# Patient Record
Sex: Male | Born: 1950 | Race: White | Hispanic: No | Marital: Single | Smoking: Never smoker
Health system: Southern US, Community
[De-identification: ages and names within clinical notes are randomized; demographics above are authoritative.]

## PROBLEM LIST (undated history)

## (undated) DIAGNOSIS — N400 Enlarged prostate without lower urinary tract symptoms: Secondary | ICD-10-CM

## (undated) DIAGNOSIS — F79 Unspecified intellectual disabilities: Secondary | ICD-10-CM

## (undated) DIAGNOSIS — F32A Depression, unspecified: Secondary | ICD-10-CM

## (undated) DIAGNOSIS — F418 Other specified anxiety disorders: Secondary | ICD-10-CM

## (undated) DIAGNOSIS — G809 Cerebral palsy, unspecified: Secondary | ICD-10-CM

## (undated) DIAGNOSIS — R569 Unspecified convulsions: Secondary | ICD-10-CM

## (undated) DIAGNOSIS — N200 Calculus of kidney: Secondary | ICD-10-CM

## (undated) DIAGNOSIS — K922 Gastrointestinal hemorrhage, unspecified: Secondary | ICD-10-CM

## (undated) DIAGNOSIS — F329 Major depressive disorder, single episode, unspecified: Secondary | ICD-10-CM

## (undated) DIAGNOSIS — K222 Esophageal obstruction: Secondary | ICD-10-CM

## (undated) DIAGNOSIS — I10 Essential (primary) hypertension: Secondary | ICD-10-CM

---

## 2000-10-07 HISTORY — PX: BASAL CELL CARCINOMA EXCISION: SHX1214

## 2000-10-07 HISTORY — PX: OTHER SURGICAL HISTORY: SHX169

## 2000-11-25 ENCOUNTER — Emergency Department (HOSPITAL_COMMUNITY): Admission: EM | Admit: 2000-11-25 | Discharge: 2000-11-25 | Payer: Self-pay | Admitting: Emergency Medicine

## 2000-11-25 ENCOUNTER — Encounter: Payer: Self-pay | Admitting: Emergency Medicine

## 2002-10-07 HISTORY — PX: SQUAMOUS CELL CARCINOMA EXCISION: SHX2433

## 2003-03-04 ENCOUNTER — Encounter: Admission: RE | Admit: 2003-03-04 | Discharge: 2003-06-02 | Payer: Self-pay | Admitting: Endocrinology

## 2004-08-07 ENCOUNTER — Emergency Department (HOSPITAL_COMMUNITY): Admission: EM | Admit: 2004-08-07 | Discharge: 2004-08-07 | Payer: Self-pay | Admitting: Emergency Medicine

## 2007-10-08 DIAGNOSIS — K222 Esophageal obstruction: Secondary | ICD-10-CM

## 2007-10-08 DIAGNOSIS — K922 Gastrointestinal hemorrhage, unspecified: Secondary | ICD-10-CM

## 2007-10-08 HISTORY — DX: Esophageal obstruction: K22.2

## 2007-10-08 HISTORY — DX: Gastrointestinal hemorrhage, unspecified: K92.2

## 2008-08-22 ENCOUNTER — Encounter (INDEPENDENT_AMBULATORY_CARE_PROVIDER_SITE_OTHER): Payer: Self-pay | Admitting: *Deleted

## 2008-08-22 ENCOUNTER — Emergency Department (HOSPITAL_COMMUNITY): Admission: EM | Admit: 2008-08-22 | Discharge: 2008-08-22 | Payer: Self-pay | Admitting: Emergency Medicine

## 2008-09-17 ENCOUNTER — Encounter (INDEPENDENT_AMBULATORY_CARE_PROVIDER_SITE_OTHER): Payer: Self-pay | Admitting: *Deleted

## 2008-09-17 ENCOUNTER — Ambulatory Visit: Payer: Self-pay | Admitting: Internal Medicine

## 2008-09-17 ENCOUNTER — Inpatient Hospital Stay (HOSPITAL_COMMUNITY): Admission: EM | Admit: 2008-09-17 | Discharge: 2008-09-20 | Payer: Self-pay | Admitting: Emergency Medicine

## 2008-09-18 ENCOUNTER — Encounter: Payer: Self-pay | Admitting: Internal Medicine

## 2008-09-18 ENCOUNTER — Encounter (INDEPENDENT_AMBULATORY_CARE_PROVIDER_SITE_OTHER): Payer: Self-pay | Admitting: *Deleted

## 2008-09-19 ENCOUNTER — Telehealth: Payer: Self-pay | Admitting: Internal Medicine

## 2008-09-20 ENCOUNTER — Telehealth (INDEPENDENT_AMBULATORY_CARE_PROVIDER_SITE_OTHER): Payer: Self-pay | Admitting: *Deleted

## 2008-09-20 ENCOUNTER — Encounter: Payer: Self-pay | Admitting: Gastroenterology

## 2008-10-19 DIAGNOSIS — K449 Diaphragmatic hernia without obstruction or gangrene: Secondary | ICD-10-CM | POA: Insufficient documentation

## 2008-10-19 DIAGNOSIS — F79 Unspecified intellectual disabilities: Secondary | ICD-10-CM | POA: Insufficient documentation

## 2008-10-19 DIAGNOSIS — G809 Cerebral palsy, unspecified: Secondary | ICD-10-CM | POA: Insufficient documentation

## 2008-10-19 DIAGNOSIS — Z87898 Personal history of other specified conditions: Secondary | ICD-10-CM

## 2008-10-19 DIAGNOSIS — F329 Major depressive disorder, single episode, unspecified: Secondary | ICD-10-CM

## 2008-10-19 DIAGNOSIS — K209 Esophagitis, unspecified without bleeding: Secondary | ICD-10-CM | POA: Insufficient documentation

## 2008-10-19 DIAGNOSIS — Z8719 Personal history of other diseases of the digestive system: Secondary | ICD-10-CM

## 2008-10-24 ENCOUNTER — Ambulatory Visit: Payer: Self-pay | Admitting: Internal Medicine

## 2008-12-14 ENCOUNTER — Emergency Department (HOSPITAL_COMMUNITY): Admission: EM | Admit: 2008-12-14 | Discharge: 2008-12-14 | Payer: Self-pay | Admitting: Emergency Medicine

## 2009-01-30 ENCOUNTER — Encounter: Payer: Self-pay | Admitting: Internal Medicine

## 2009-02-21 ENCOUNTER — Encounter: Admission: RE | Admit: 2009-02-21 | Discharge: 2009-05-22 | Payer: Self-pay | Admitting: Endocrinology

## 2009-07-12 ENCOUNTER — Encounter: Admission: RE | Admit: 2009-07-12 | Discharge: 2009-10-04 | Payer: Self-pay | Admitting: Endocrinology

## 2009-11-10 ENCOUNTER — Encounter: Payer: Self-pay | Admitting: Internal Medicine

## 2009-11-23 ENCOUNTER — Encounter: Payer: Self-pay | Admitting: Internal Medicine

## 2010-03-14 ENCOUNTER — Inpatient Hospital Stay (HOSPITAL_COMMUNITY): Admission: EM | Admit: 2010-03-14 | Discharge: 2010-03-19 | Payer: Self-pay | Admitting: Emergency Medicine

## 2010-03-14 ENCOUNTER — Encounter (INDEPENDENT_AMBULATORY_CARE_PROVIDER_SITE_OTHER): Payer: Self-pay | Admitting: *Deleted

## 2010-05-24 ENCOUNTER — Encounter: Payer: Self-pay | Admitting: Internal Medicine

## 2010-06-01 ENCOUNTER — Emergency Department (HOSPITAL_COMMUNITY): Admission: EM | Admit: 2010-06-01 | Discharge: 2010-06-01 | Payer: Self-pay | Admitting: Emergency Medicine

## 2010-07-14 ENCOUNTER — Inpatient Hospital Stay (HOSPITAL_COMMUNITY): Admission: EM | Admit: 2010-07-14 | Discharge: 2010-07-16 | Payer: Self-pay | Admitting: Emergency Medicine

## 2010-07-25 ENCOUNTER — Emergency Department (HOSPITAL_COMMUNITY): Admission: EM | Admit: 2010-07-25 | Discharge: 2010-07-25 | Payer: Self-pay | Admitting: Emergency Medicine

## 2010-08-07 ENCOUNTER — Emergency Department (HOSPITAL_COMMUNITY): Admission: EM | Admit: 2010-08-07 | Discharge: 2010-08-07 | Payer: Self-pay | Admitting: Emergency Medicine

## 2010-08-11 ENCOUNTER — Emergency Department (HOSPITAL_COMMUNITY): Admission: EM | Admit: 2010-08-11 | Discharge: 2010-08-11 | Payer: Self-pay | Admitting: Emergency Medicine

## 2010-08-20 ENCOUNTER — Emergency Department (HOSPITAL_COMMUNITY): Admission: EM | Admit: 2010-08-20 | Discharge: 2010-08-21 | Payer: Self-pay | Admitting: Emergency Medicine

## 2010-08-29 ENCOUNTER — Ambulatory Visit: Payer: Self-pay | Admitting: Internal Medicine

## 2010-09-22 ENCOUNTER — Emergency Department (HOSPITAL_COMMUNITY)
Admission: EM | Admit: 2010-09-22 | Discharge: 2010-09-22 | Payer: Self-pay | Source: Home / Self Care | Admitting: Emergency Medicine

## 2010-10-03 ENCOUNTER — Encounter: Payer: Self-pay | Admitting: Internal Medicine

## 2010-10-03 ENCOUNTER — Ambulatory Visit (HOSPITAL_COMMUNITY)
Admission: RE | Admit: 2010-10-03 | Discharge: 2010-10-03 | Payer: Self-pay | Source: Home / Self Care | Attending: Internal Medicine | Admitting: Internal Medicine

## 2010-10-06 ENCOUNTER — Encounter: Payer: Self-pay | Admitting: Internal Medicine

## 2010-11-06 NOTE — Miscellaneous (Signed)
Summary: Protonix Refills  Clinical Lists Changes  Medications: Changed medication from PROTONIX 40 MG TBEC (PANTOPRAZOLE SODIUM) 1 tab by mouth by mouth two times a day to PROTONIX 40 MG TBEC (PANTOPRAZOLE SODIUM) 1 tab by mouth by mouth two times a day. NEED OFFICE VISIT FOR FURTHER REFILLS! - Signed Rx of PROTONIX 40 MG TBEC (PANTOPRAZOLE SODIUM) 1 tab by mouth by mouth two times a day. NEED OFFICE VISIT FOR FURTHER REFILLS!;  #60 x 2;  Signed;  Entered by: Lamona Curl CMA (AAMA);  Authorized by: Hart Carwin MD;  Method used: Faxed to [facility], [address] [place], [place], [place]  [address], Ph: [phone], Fax: [phone]    Prescriptions: PROTONIX 40 MG TBEC (PANTOPRAZOLE SODIUM) 1 tab by mouth by mouth two times a day. NEED OFFICE VISIT FOR FURTHER REFILLS!  #60 x 2   Entered by:   Lamona Curl CMA (AAMA)   Authorized by:   Hart Carwin MD   Signed by:   Lamona Curl CMA (AAMA) on 05/24/2010   Method used:   Faxed to ...       [facility] (retail)       [address]       [place], [place]  [address]       Ph: [phone]       Fax: [phone]   RxID:   [id number]

## 2010-11-06 NOTE — Assessment & Plan Note (Signed)
Summary: refill Protonix...as.   History of Present Illness Visit Type: Follow-up Visit Primary GI MD: Lina Sar MD Primary Romona Murdy: Adrian Prince, MD Chief Complaint: Patient doing good and just needs a refill of Protonix.  History of Present Illness:   This is a 60 year old white male with cerebral palsy who lives in a group home. He comes today for refills of his Protonix. His last office visit was in January 2010. He has been on Protonix 40 mg twice  a day with occasional heartburn and cough. An upper endoscopy in December 2009  showed reflux esophagitis and a stricture as well as a hiatal hernia. The biopsy showed esophagitis but no evidence of Barrett's esophagus. His caregiver describes patient to have regular bowel habits with occasional blood per rectum.   GI Review of Systems      Denies abdominal pain, acid reflux, belching, bloating, chest pain, dysphagia with liquids, dysphagia with solids, heartburn, loss of appetite, nausea, vomiting, vomiting blood, weight loss, and  weight gain.        Denies anal fissure, black tarry stools, change in bowel habit, constipation, diarrhea, diverticulosis, fecal incontinence, heme positive stool, hemorrhoids, irritable bowel syndrome, jaundice, light color stool, liver problems, rectal bleeding, and  rectal pain.    Current Medications (verified): 1)  Protonix 40 Mg Tbec (Pantoprazole Sodium) .Marland Kitchen.. 1 Tab By Mouth By Mouth Two Times A Day. Need Office Visit For Further Refills! 2)  Cardura 8 Mg Tabs (Doxazosin Mesylate) .... Take 1/2  Tablet By Mouth Once A Day 3)  Lexapro 20 Mg Tabs (Escitalopram Oxalate) .... Take 1 Tablet By Mouth Once A Day 4)  Multivitamins  Tabs (Multiple Vitamin) .... Take 1 Tablet By Mouth Once A Day 5)  Dilantin 100 Mg Caps (Phenytoin Sodium Extended) .... Take Two By Mouth Once Daily 6)  Amitriptyline Hcl 25 Mg Tabs (Amitriptyline Hcl) .... Take One By Mouth Once Daily 7)  Lorazepam 1 Mg Tabs (Lorazepam) .... Take  One By Mouth Two Times A Day 8)  Enablex 15 Mg Xr24h-Tab (Darifenacin Hydrobromide) .... Take One By Mouth Once Daily 9)  Tramadol Hcl 50 Mg Tabs (Tramadol Hcl) .... Take One By Mouth As Needed 10)  Antibiotic .... Take One By Mouth Every 8 Hours  Allergies (verified): No Known Drug Allergies  Past History:  Past Medical History: Reviewed history from 10/19/2008 and no changes required. Current Problems:  HIATAL HERNIA (ICD-553.3) GASTROINTESTINAL HEMORRHAGE, HX OF (ICD-V12.79) ESOPHAGITIS (ICD-530.10) BENIGN PROSTATIC HYPERTROPHY, HX OF (ICD-V13.8) DEPRESSION, CHRONIC (ICD-311) CEREBRAL PALSY (ICD-343.9) MENTAL RETARDATION (ICD-319)  Past Surgical History: Reviewed history from 10/19/2008 and no changes required. unremarkable  Family History: Reviewed history from 10/19/2008 and no changes required. Unknown  Social History: Reviewed history from 10/19/2008 and no changes required. Patient lives in a Group home and is wheel chair bound Alcohol Use - no Illicit Drug Use - no  Review of Systems       Pertinent positive and negative review of systems were noted in the above HPI. All other ROS was otherwise negative.   Vital Signs:  Patient profile:   60 year old male Height:      73 inches Pulse rate:   100 / minute Pulse rhythm:   regular BP sitting:   124 / 74  (left arm) Cuff size:   regular  Vitals Entered By: Harlow Mares CMA Duncan Dull) (August 29, 2010 8:45 AM) patient unable to weigh   Physical Exam  General:  alert and oriented, in a wheelchair  with multiple deformities. Eyes:  nonicteric. Neck:  Supple; no masses or thyromegaly. Lungs:  Clear throughout to auscultation. Heart:  Regular rate and rhythm; no murmurs, rubs,  or bruits. Abdomen:  soft abdomen unable to Lay on the examining table. Normoactive bowel sounds. No tenderness. Rectal:  soft Hemoccult negative stool. Extremities:  joint deformities. Unable to get up or stand up. Skin:  no  pressure ulcers. Psych:  Alert and cooperative. Normal mood and affect.   Impression & Recommendations:  Problem # 1:  HIATAL HERNIA (ICD-553.3) Patient has a history of gastroesophageal reflux and an upper GI bleed in December 2009. He is controlled with Protonix 40 mg daily. He has had symptoms of heartburn indicating inadequate acid suppression. We will increase Protonix to 40 mg p.o. b.i.d.  Problem # 2:  CEREBRAL PALSY (ICD-343.9) Assessment: Comment Only  Problem # 3:  SPECIAL SCREENING FOR MALIGNANT NEOPLASMS COLON (ICD-V76.51)  We will schedule patient for a screening colonoscopy. He has had occasional blood per rectum but today's exam shows heme negative stool.   Orders: Colonoscopy (Colon)  Patient Instructions: 1)  Continue your Protonix to 40 mg twice daily. 2)  You have been scheduled for a coloscopy. Please follow written prep instructions that were given to you today at your visit.  3)  Please pick up your prescription for Miralax, Dulcolax and Reglan at the pharmacy. An electronic presription has already been sent.  4)  You should follow your antireflux measures. 5)  Copy sent to : Dr Kathie Rhodes.[place] 6)  The medication list was reviewed and reconciled.  All changed / newly prescribed medications were explained.  A complete medication list was provided to the patient / caregiver. Prescriptions: DULCOLAX 5 MG  TBEC (BISACODYL) Day before procedure take 2 at 3pm and 2 at 8pm.  #4 x 0   Entered by:   Lamona Curl CMA (AAMA)   Authorized by:   Hart Carwin MD   Signed by:   Lamona Curl CMA (AAMA) on 08/29/2010   Method used:   Faxed to ...       [facility] (retail)       [address]       [place], [place]  [address]       Ph: [phone]       Fax: [phone]   RxID:   [id number] REGLAN 10 MG  TABS (METOCLOPRAMIDE HCL) As per prep instructions.  #2 x 0   Entered by:   Lamona Curl CMA (AAMA)   Authorized by:    Hart Carwin MD   Signed by:   Lamona Curl CMA (AAMA) on 08/29/2010   Method used:   Faxed to ...       [facility] (retail)       [address]       [place], [place]  [address]       Ph: [phone]       Fax: [phone]   RxID:   [id number] MIRALAX   POWD (POLYETHYLENE GLYCOL 3350) As per prep  instructions.  #255gm x 0   Entered by:   Lamona Curl CMA (AAMA)   Authorized by:   Hart Carwin MD   Signed by:   Lamona Curl CMA (AAMA) on 08/29/2010   Method used:   Faxed to ...       [facility] (retail)  [address]       [place], [place]  [address]       Ph: [phone]       Fax: [phone]   RxID:   [id number] PROTONIX 40 MG TBEC (PANTOPRAZOLE SODIUM) 1 tab by mouth by mouth two times a day.  #60 x 4   Entered by:   Lamona Curl CMA (AAMA)   Authorized by:   Hart Carwin MD   Signed by:   Lamona Curl CMA (AAMA) on 08/29/2010   Method used:   Faxed to ...       [facility] (retail)       [address]       [place], [place]  [address]       Ph: [phone]       Fax: [phone]   RxID:   [id number]

## 2010-11-06 NOTE — Letter (Signed)
Summary: [facility] Instructions  [facility]  [address] [place], [place] [address]   Phone: [phone]  Fax: [phone]       Colin Bradley    [birth year]    MRN: [id number]       Procedure Day Dorna Bloom: Wednesday 10/03/10     Arrival Time: 7:30 am     Procedure Time: 8:30 am     Location of Procedure:                     _x _  [facility] ( Outpatient Registration)  PREPARATION FOR COLONOSCOPY WITH MIRALAX  Starting 5 days prior to your procedure (09/29/10) do not eat nuts, seeds, popcorn, corn, beans, peas,  salads, or any raw vegetables.  Do not take any fiber supplements (e.g. Metamucil, Citrucel, and Benefiber). ____________________________________________________________________________________________________   THE DAY BEFORE YOUR PROCEDURE         DATE: 10/02/10 DAY: Tuesday  1   Drink clear liquids the entire day-NO SOLID FOOD  2   Do not drink anything colored red or purple.  Avoid juices with pulp.  No orange juice.  3   Drink at least 64 oz. (8 glasses) of fluid/clear liquids during the day to prevent dehydration and help the prep work efficiently.  CLEAR LIQUIDS INCLUDE: Water Jello Ice Popsicles Tea (sugar ok, no milk/cream) Powdered fruit flavored drinks Coffee (sugar ok, no milk/cream) Gatorade Juice: apple, white grape, white cranberry  Lemonade Clear bullion, consomm, broth Carbonated beverages (any kind) Strained chicken noodle soup Hard Candy  4   Mix the entire bottle of Miralax with 64 oz. of Gatorade/Powerade in the morning and put in the refrigerator to chill.  5   At 3:00 pm take 2 Dulcolax/Bisacodyl tablets.  6   At 4:30 pm take one Reglan/Metoclopramide tablet.  7  Starting at 5:00 pm drink one 8 oz glass of the Miralax mixture every 15-20 minutes until you have finished drinking the entire 64 oz.  You should finish drinking prep around 7:30 or 8:00 pm.  8   If you are nauseated, you may take the 2nd  Reglan/Metoclopramide tablet at 6:30 pm.        9    At 8:00 pm take 2 more DULCOLAX/Bisacodyl tablets.     THE DAY OF YOUR PROCEDURE      DATE:  10/03/10 DAY: Wednesday  You may drink clear liquids until 4:30 am  (4 HOURS BEFORE PROCEDURE).   MEDICATION INSTRUCTIONS  Unless otherwise instructed, you should take regular prescription medications with a small sip of water as early as possible the morning of your procedure.        OTHER INSTRUCTIONS  You will need a responsible adult at least 61 years of age to accompany you and drive you home.   This person must remain in the waiting room during your procedure.  Wear loose fitting clothing that is easily removed.  Leave jewelry and other valuables at home.  However, you may wish to bring a book to read or an iPod/MP3 player to listen to music as you wait for your procedure to start.  Remove all body piercing jewelry and leave at home.  Total time from sign-in until discharge is approximately 2-3 hours.  You should go home directly after your procedure and rest.  You can resume normal activities the day after your procedure.  The day of your procedure you should not:   Drive   Make legal  decisions   Operate machinery   Drink alcohol   Return to work  You will receive specific instructions about eating, activities and medications before you leave.   The above instructions have been reviewed and explained to me by   Lamona Curl CMA Duncan Dull)  August 29, 2010 9:17 AM     I fully understand and can verbalize these instructions _____________________________ Date 08/29/10

## 2010-11-06 NOTE — Miscellaneous (Signed)
Summary: protonix rx  Clinical Lists Changes  Medications: Changed medication from PROTONIX 40 MG TBEC (PANTOPRAZOLE SODIUM) 1 tab by mouth by mouth two times a day to PROTONIX 40 MG TBEC (PANTOPRAZOLE SODIUM) 1 tab by mouth by mouth two times a day - Signed Rx of PROTONIX 40 MG TBEC (PANTOPRAZOLE SODIUM) 1 tab by mouth by mouth two times a day;  #60 x 5;  Signed;  Entered by: Hortense Ramal CMA (AAMA);  Authorized by: Hart Carwin MD;  Method used: Faxed to [facility], [address] [place], [place], [place]  [address], Ph: [phone], Fax: [phone]    Prescriptions: PROTONIX 40 MG TBEC (PANTOPRAZOLE SODIUM) 1 tab by mouth by mouth two times a day  #60 x 5   Entered by:   Hortense Ramal CMA (AAMA)   Authorized by:   Hart Carwin MD   Signed by:   Hortense Ramal CMA (AAMA) on 11/10/2009   Method used:   Faxed to ...       [facility] (retail)       [address]       [place], [place]  [address]       Ph: [phone]       Fax: [phone]   RxID:   [id number]

## 2010-11-06 NOTE — Medication Information (Signed)
Summary: Protonix/[employer]  Protonix/[employer]   Imported By: Sherian Rein 12/20/2009 09:04:13  _____________________________________________________________________  External Attachment:    Type:   Image     Comment:   External Document

## 2010-11-08 NOTE — Procedures (Signed)
Summary: Colonoscopy  Patient: [name] Note: All result statuses are Final unless otherwise noted.  Tests: (1) Colonoscopy (COL)   COL Colonoscopy           DONE     [facility]     [address] [place], [place]  [address]           COLONOSCOPY PROCEDURE REPORT           PATIENT:  Colin Bradley, Colin Bradley  MR#:  [id number]     BIRTHDATE:  01-07-1951, 59 yrs. old  GENDER:  male     ENDOSCOPIST:  Hedwig Morton. Juanda Chance, MD     REF. BY:  Adrian Prince, M.D.     PROCEDURE DATE:  10/03/2010     PROCEDURE:  Colonoscopy [address]     ASA CLASS:  Class II     INDICATIONS:  Routine Risk Screening occasional rectal bleeding     MEDICATIONS:   Versed 9 mg, Fentanyl 100 mcg           DESCRIPTION OF PROCEDURE:   After the risks benefits and     alternatives of the procedure were thoroughly explained, informed     consent was obtained.  Digital rectal exam was performed and     revealed no rectal masses.   The Pentax Ped Colon [id number]     endoscope was introduced through the anus and advanced to the     cecum, which was identified by both the appendix and ileocecal     valve, without limitations.  The quality of the prep was good,     using MiraLax.  The instrument was then slowly withdrawn as the     colon was fully examined.     <<PROCEDUREIMAGES>>           FINDINGS:  Mild diverticulosis was found in the sigmoid colon (see     image4). few shallow diverticuli  Otherwise normal colonoscopy     without other polyps, masses, vascular ectasias, or inflammatory     changes. hyperemic left colon mucosa, probably prep related With     standard forceps, biopsy was obtained and sent to pathology (see     image2 and image1).  Internal hemorrhoids were found (see image5).     Retroflexed views in the rectum revealed no abnormalities.    The     scope was then withdrawn from the patient and th     e procedure completed.           COMPLICATIONS:  None     ENDOSCOPIC IMPRESSION:     1) Mild  diverticulosis in the sigmoid colon     2) Otherwise nl colonoscopy WMO     3) Internal hemorrhoids     RECOMMENDATIONS:     1) high fiber diet     REPEAT EXAM:  In 10 year(s) for.           ______________________________     Hedwig Morton. Juanda Chance, MD           CC:           n.     eSIGNED:   Hedwig Morton. Brodie at 10/03/2010 09:25 AM           Rae Mar, [id number]  Note: An exclamation mark (!) indicates a result that was not dispersed into the flowsheet. Document Creation Date: 10/03/2010 9:26 AM _______________________________________________________________________  (1) Order result status: Final Collection or observation date-time: 10/03/2010  09:19 Requested date-time:  Receipt date-time:  Reported date-time:  Referring Physician:   Ordering Physician: Lina Sar [phone]) Specimen Source:  Source: Launa Grill Order Number: [id number] Lab site:   Appended Document: Colonoscopy recall entered in [employer] for 09/2020   Clinical Lists Changes  Observations: Added new observation of COLONNXTDUE: 09/2020 (10/03/2010 15:50)

## 2010-11-08 NOTE — Procedures (Signed)
Summary: Instructions for procedure/[place]  Instructions for procedure/[place]   Imported By: Sherian Rein 10/03/2010 08:48:05  _____________________________________________________________________  External Attachment:    Type:   Image     Comment:   External Document

## 2010-11-08 NOTE — Letter (Signed)
Summary: Patient Notice- Colon Biospy Results  [facility]  [address] [place], [place] [address]   Phone: [phone]  Fax: [phone]        October 06, 2010 MRN: [id number]    Colin Bradley [address] [place], [place]  [address]    Dear [name],  I am pleased to inform you that the biopsies taken during your recent colonoscopy did not show any evidence of cancer upon pathologic examination.The biopsies show normal colon tissue  Additional information/recommendations:  _x_No further action is needed at this time.  Please follow-up with      your primary care physician for your other healthcare needs.  __Please call [phone] to schedule a return visit to review      your condition.  __Continue with the treatment plan as outlined on the day of your      exam.  __You should have a repeat colonoscopy examination for this problem           in _ years.  Please call us if you are having persistent problems or have questions about your condition that have not been fully answered at this time.  Sincerely,  Hart Carwin MD   This letter has been electronically signed by your physician.  Appended Document: Patient Notice- Colon Biospy Results Letter mailed to patient's home.

## 2010-12-17 LAB — COMPREHENSIVE METABOLIC PANEL
ALT: 21 U/L (ref 0–53)
CO2: 25 mEq/L (ref 19–32)
Chloride: 104 mEq/L (ref 96–112)
GFR calc Af Amer: >60 mL/min (ref >60)
Glucose, Bld: 146 mg/dL — ABNORMAL HIGH (ref 70–99)
Potassium: 3.5 mEq/L (ref 3.5–5.1)
Total Protein: 6.8 g/dL (ref 6.0–8.3)

## 2010-12-17 LAB — DIFFERENTIAL
Eosinophils Relative: 5 % (ref 0–5)
Lymphocytes Relative: 45 % (ref 12–46)
Monocytes Relative: 9 % (ref 3–12)
Neutrophils Relative %: 41 % — ABNORMAL LOW (ref 43–77)

## 2010-12-17 LAB — CBC
HCT: 38.6 % — ABNORMAL LOW (ref 39.0–52.0)
Hemoglobin: 13.8 g/dL (ref 13.0–17.0)
MCH: 32.9 pg (ref 26.0–34.0)
MCV: 92.1 fL (ref 78.0–100.0)
Platelets: 222 10*3/uL (ref 150–400)

## 2010-12-17 LAB — HEMOCCULT GUIAC POC 1CARD (OFFICE): Fecal Occult Bld: NEGATIVE

## 2010-12-18 LAB — URINALYSIS, ROUTINE W REFLEX MICROSCOPIC
Bilirubin Urine: NEGATIVE
Hgb urine dipstick: NEGATIVE
Nitrite: NEGATIVE
Protein, ur: NEGATIVE mg/dL
Specific Gravity, Urine: 1.027 (ref 1.005–1.030)
Urobilinogen, UA: 1 mg/dL (ref 0.0–1.0)

## 2010-12-19 LAB — DIFFERENTIAL
Basophils Relative: 0 % (ref 0–1)
Eosinophils Relative: 4 % (ref 0–5)
Lymphs Abs: 1.2 10*3/uL (ref 0.7–4.0)
Lymphs Abs: 1.2 10*3/uL (ref 0.7–4.0)
Monocytes Relative: 11 % (ref 3–12)
Monocytes Relative: 11 % (ref 3–12)
Monocytes Relative: 9 % (ref 3–12)
Neutro Abs: 3.3 10*3/uL (ref 1.7–7.7)
Neutro Abs: 5.5 10*3/uL (ref 1.7–7.7)
Neutrophils Relative %: 61 % (ref 43–77)
Neutrophils Relative %: 62 % (ref 43–77)

## 2010-12-19 LAB — CBC
HCT: 37.9 % — ABNORMAL LOW (ref 39.0–52.0)
HCT: 43.5 % (ref 39.0–52.0)
Hemoglobin: 14.8 g/dL (ref 13.0–17.0)
Hemoglobin: 15 g/dL (ref 13.0–17.0)
MCH: 32.8 pg (ref 26.0–34.0)
MCH: 32.6 pg (ref 26.0–34.0)
MCHC: 34.8 g/dL (ref 30.0–36.0)
MCHC: 34.5 g/dL (ref 30.0–36.0)
MCV: 94.4 fL (ref 78.0–100.0)
MCV: 94.6 fL (ref 78.0–100.0)
Platelets: 403 10*3/uL — ABNORMAL HIGH (ref 150–400)
Platelets: 348 10*3/uL (ref 150–400)
RBC: 4.51 MIL/uL (ref 4.22–5.81)
RDW: 12.7 % (ref 11.5–15.5)
RDW: 12.8 % (ref 11.5–15.5)
RDW: 12.8 % (ref 11.5–15.5)
WBC: 6.2 10*3/uL (ref 4.0–10.5)
WBC: 9.1 10*3/uL (ref 4.0–10.5)

## 2010-12-19 LAB — BASIC METABOLIC PANEL
BUN: 12 mg/dL (ref 6–23)
BUN: 11 mg/dL (ref 6–23)
CO2: 28 mEq/L (ref 19–32)
CO2: 16 mEq/L — ABNORMAL LOW (ref 19–32)
Calcium: 9.7 mg/dL (ref 8.4–10.5)
Chloride: 106 mEq/L (ref 96–112)
Creatinine, Ser: 0.85 mg/dL (ref 0.4–1.5)
GFR calc Af Amer: >60 mL/min (ref >60)
GFR calc Af Amer: >60 mL/min (ref >60)
Glucose, Bld: 157 mg/dL — ABNORMAL HIGH (ref 70–99)
Potassium: 4 mEq/L (ref 3.5–5.1)
Sodium: 140 mEq/L (ref 135–145)

## 2010-12-19 LAB — RAPID URINE DRUG SCREEN, HOSP PERFORMED
Barbiturates: NOT DETECTED
Benzodiazepines: POSITIVE — AB
Opiates: NOT DETECTED
Tetrahydrocannabinol: NOT DETECTED

## 2010-12-19 LAB — COMPREHENSIVE METABOLIC PANEL
BUN: 10 mg/dL (ref 6–23)
Calcium: 9.1 mg/dL (ref 8.4–10.5)
Creatinine, Ser: 1.03 mg/dL (ref 0.4–1.5)
GFR calc non Af Amer: >60 mL/min (ref >60)
Glucose, Bld: 82 mg/dL (ref 70–99)
Total Protein: 6.5 g/dL (ref 6.0–8.3)

## 2010-12-19 LAB — URINALYSIS, ROUTINE W REFLEX MICROSCOPIC
Bilirubin Urine: NEGATIVE
Nitrite: NEGATIVE
Protein, ur: NEGATIVE mg/dL
Specific Gravity, Urine: 1.013 (ref 1.005–1.030)
Urobilinogen, UA: 1 mg/dL (ref 0.0–1.0)

## 2010-12-19 LAB — POCT CARDIAC MARKERS
CKMB, poc: <1 ng/mL — ABNORMAL LOW (ref 1.0–8.0)
Myoglobin, poc: 174 ng/mL (ref 12–200)

## 2010-12-19 LAB — GLUCOSE, CAPILLARY

## 2010-12-19 LAB — PHENYTOIN LEVEL, TOTAL: Phenytoin Lvl: 14.6 ug/mL (ref 10.0–20.0)

## 2010-12-20 LAB — CBC
HCT: 36.7 % — ABNORMAL LOW (ref 39.0–52.0)
MCH: 33.3 pg (ref 26.0–34.0)
MCV: 94.6 fL (ref 78.0–100.0)
Platelets: 256 10*3/uL (ref 150–400)
RBC: 3.87 MIL/uL — ABNORMAL LOW (ref 4.22–5.81)
WBC: 5.1 10*3/uL (ref 4.0–10.5)

## 2010-12-20 LAB — POCT CARDIAC MARKERS
CKMB, poc: <1 ng/mL — ABNORMAL LOW (ref 1.0–8.0)
Myoglobin, poc: 103 ng/mL (ref 12–200)
Troponin i, poc: <0.05 ng/mL (ref 0.00–0.09)

## 2010-12-20 LAB — URINALYSIS, ROUTINE W REFLEX MICROSCOPIC
Glucose, UA: NEGATIVE mg/dL
Nitrite: NEGATIVE
Protein, ur: NEGATIVE mg/dL
pH: 6.5 (ref 5.0–8.0)

## 2010-12-20 LAB — DIFFERENTIAL
Basophils Absolute: 0 10*3/uL (ref 0.0–0.1)
Basophils Relative: 1 % (ref 0–1)
Lymphocytes Relative: 29 % (ref 12–46)
Monocytes Absolute: 0.5 10*3/uL (ref 0.1–1.0)
Neutro Abs: 2.8 10*3/uL (ref 1.7–7.7)
Neutrophils Relative %: 55 % (ref 43–77)

## 2010-12-20 LAB — COMPREHENSIVE METABOLIC PANEL
Albumin: 4 g/dL (ref 3.5–5.2)
Alkaline Phosphatase: 51 U/L (ref 39–117)
BUN: 9 mg/dL (ref 6–23)
Chloride: 105 mEq/L (ref 96–112)
Creatinine, Ser: 1.32 mg/dL (ref 0.4–1.5)
GFR calc non Af Amer: 56 mL/min — ABNORMAL LOW (ref >60)
Glucose, Bld: 87 mg/dL (ref 70–99)
Potassium: 4 mEq/L (ref 3.5–5.1)
Total Bilirubin: 0.7 mg/dL (ref 0.3–1.2)

## 2010-12-20 LAB — URINE CULTURE
Colony Count: 4000
Culture  Setup Time: 201108270045

## 2010-12-24 LAB — CBC
HCT: 31.7 % — ABNORMAL LOW (ref 39.0–52.0)
HCT: 33.5 % — ABNORMAL LOW (ref 39.0–52.0)
Hemoglobin: 11.1 g/dL — ABNORMAL LOW (ref 13.0–17.0)
Hemoglobin: 11.5 g/dL — ABNORMAL LOW (ref 13.0–17.0)
Hemoglobin: 11.9 g/dL — ABNORMAL LOW (ref 13.0–17.0)
Hemoglobin: 12.6 g/dL — ABNORMAL LOW (ref 13.0–17.0)
MCHC: 34.9 g/dL (ref 30.0–36.0)
MCV: 95.8 fL (ref 78.0–100.0)
MCV: 96 fL (ref 78.0–100.0)
MCV: 96.1 fL (ref 78.0–100.0)
Platelets: 202 10*3/uL (ref 150–400)
Platelets: 254 10*3/uL (ref 150–400)
RBC: 3.61 MIL/uL — ABNORMAL LOW (ref 4.22–5.81)
RBC: 3.79 MIL/uL — ABNORMAL LOW (ref 4.22–5.81)
RDW: 13.3 % (ref 11.5–15.5)
RDW: 13.4 % (ref 11.5–15.5)
WBC: 4.3 10*3/uL (ref 4.0–10.5)
WBC: 5 10*3/uL (ref 4.0–10.5)
WBC: 5.6 10*3/uL (ref 4.0–10.5)

## 2010-12-24 LAB — IRON AND TIBC
Saturation Ratios: 19 % — ABNORMAL LOW (ref 20–55)
TIBC: 263 ug/dL (ref 215–435)
UIBC: 213 ug/dL

## 2010-12-24 LAB — COMPREHENSIVE METABOLIC PANEL
ALT: 19 U/L (ref 0–53)
AST: 32 U/L (ref 0–37)
BUN: 9 mg/dL (ref 6–23)
CO2: 23 mEq/L (ref 19–32)
Calcium: 8.5 mg/dL (ref 8.4–10.5)
Chloride: 104 mEq/L (ref 96–112)
GFR calc Af Amer: >60 mL/min (ref >60)
GFR calc non Af Amer: 59 mL/min — ABNORMAL LOW (ref >60)
Glucose, Bld: 97 mg/dL (ref 70–99)
Glucose, Bld: 99 mg/dL (ref 70–99)
Sodium: 135 mEq/L (ref 135–145)
Total Bilirubin: 1.3 mg/dL — ABNORMAL HIGH (ref 0.3–1.2)
Total Protein: 5.9 g/dL — ABNORMAL LOW (ref 6.0–8.3)

## 2010-12-24 LAB — BASIC METABOLIC PANEL
BUN: 9 mg/dL (ref 6–23)
CO2: 20 mEq/L (ref 19–32)
CO2: 22 mEq/L (ref 19–32)
CO2: 25 mEq/L (ref 19–32)
Calcium: 8.5 mg/dL (ref 8.4–10.5)
Chloride: 108 mEq/L (ref 96–112)
Chloride: 111 mEq/L (ref 96–112)
Creatinine, Ser: 1.11 mg/dL (ref 0.4–1.5)
GFR calc Af Amer: >60 mL/min (ref >60)
GFR calc Af Amer: >60 mL/min (ref >60)
GFR calc Af Amer: >60 mL/min (ref >60)
GFR calc non Af Amer: >60 mL/min (ref >60)
Glucose, Bld: 94 mg/dL (ref 70–99)
Potassium: 3.5 mEq/L (ref 3.5–5.1)
Sodium: 137 mEq/L (ref 135–145)
Sodium: 140 mEq/L (ref 135–145)

## 2010-12-24 LAB — OVA AND PARASITE EXAMINATION

## 2010-12-24 LAB — CLOSTRIDIUM DIFFICILE EIA

## 2010-12-24 LAB — FERRITIN: Ferritin: 569 ng/mL — ABNORMAL HIGH (ref 22–322)

## 2010-12-24 LAB — URINALYSIS, ROUTINE W REFLEX MICROSCOPIC
Glucose, UA: NEGATIVE mg/dL
Ketones, ur: NEGATIVE mg/dL
Protein, ur: NEGATIVE mg/dL
Urobilinogen, UA: 0.2 mg/dL (ref 0.0–1.0)

## 2010-12-24 LAB — DIFFERENTIAL
Basophils Absolute: 0 10*3/uL (ref 0.0–0.1)
Basophils Relative: 1 % (ref 0–1)
Eosinophils Absolute: 0.1 10*3/uL (ref 0.0–0.7)
Eosinophils Relative: 1 % (ref 0–5)
Neutrophils Relative %: 62 % (ref 43–77)

## 2010-12-24 LAB — URINE CULTURE

## 2010-12-24 LAB — STOOL CULTURE

## 2010-12-24 LAB — FECAL LACTOFERRIN, QUANT

## 2010-12-24 LAB — LIPASE, BLOOD: Lipase: 22 U/L (ref 11–59)

## 2011-02-19 NOTE — H&P (Signed)
NAMEALEC, JAROS              ACCOUNT NO.:  [id number]   MEDICAL RECORD NO.:  [id number]          PATIENT TYPE:  INP   LOCATION:  1309                         FACILITY:  [facility]   PHYSICIAN:  Hedwig Morton. Juanda Chance, MD     DATE OF BIRTH:  [birth year]   DATE OF ADMISSION:  09/17/2008  DATE OF DISCHARGE:                              HISTORY & PHYSICAL   CHIEF COMPLAINT:  The patient is brought to the emergency room with  complaints of frequent urination and says sick on the stomach .  After  arrival to the emergency room, had a large volume of coffee-ground  emesis.   HISTORY:  Colin Bradley is a 60 year old white male apparently resides in a  group living situation with history of cerebral palsy, depression, over  active bladder.  He is known to [employer].  He was brought to the  emergency room from the group home with complaints of urinary frequency.  After arrival complained of nausea and then vomited a large amount of  coffee-ground material.  He is unable to give much pertinent history.  Apparently, he had some initial complaint of chest discomfort.  He is  unable to describe whether this is nausea, epigastric pain, or  heartburn.  He was hemodynamically stable in the emergency room.  NG was  placed with dark brown return.  He is unaware of any melena or  hematochezia.  He is not on any regular aspirin or NSAIDs per his mid  list.   LABS IN THE ER:  Show a WBC of 10.9, hemoglobin of 13.7, hematocrit  40.3, MCV of 98.5, platelets 264,000.  Sodium 138, potassium 3.8, BUN  19, creatinine 1.39.  Liver function studies normal.  UA negative.  Chest x-ray on admission shows no active disease.  At this time, the  patient was seen and evaluated by Dr. Lina Sar, who was covering for  [facility], and is admitted for medical management and further  evaluation of the coffee-ground emesis.   MEDICATIONS:  1. Cardura 8 mg p.o. daily.  2. Elavil 25 nightly.  3. Enablex 15 mg daily.  4. Lexapro  20 daily.  5. Lofibra 160 daily.   ALLERGIES:  No known drug allergies.   PAST HISTORY:  Pertinent for cerebral palsy, over active bladder, BPH  and depression.  The patient unable to give any further past history.   FAMILY HISTORY:  Unknown.   SOCIAL HISTORY:  The patient lives in a group home with cerebral palsy.  He is wheelchair bound.  He is single, no tobacco and no EtOH.   REVIEW OF SYSTEMS:  The patient is a poor historian but currently denies  any chest pain or anginal symptoms.  PULMONARY:  Denies any cough,  shortness of breath or sputum production.  GU: Has had some urinary  urgency and frequency, apparently.  MUSCULOSKELETAL:  Denies.  NEURO:  Denies.  GENERAL:  Complains of nausea and not feeling well.   LABS:  As outlined above.   PHYSICAL EXAM:  CONSTITUTIONAL:  Per Dr. Lina Sar.  Well-developed  white male in no acute distress.  He is alert, communicative.  VITAL SIGNS:  Blood pressure on arrival in the ER 158/98, pulse is 120,  sats 96%.  Temperature is 97.8.  HEENT: Nontraumatic, normocephalic.  EOMI, PERRLA.  Sclerae anicteric.  CARDIOVASCULAR:  Regular rate and rhythm to slightly tachy.  No murmur,  gallop.  PULMONARY:  Clear to auscultation.  ABDOMEN:  Soft, nontender.  There is no palpable mass or  hepatosplenomegaly.  Bowel sounds are active.  RECTAL:  Exam reveals brown strongly Hemoccult positive stools.  EXTREMITIES:  Muscular atrophy in the upper and lower extremities no  edema.  Pulses are 2+ and equal.  NEURO:  Patient is alert oriented x2.   IMPRESSION:  9. A 60 year old white male with cerebral palsy presenting with nausea      and coffee-ground emesis with normal hemoglobin on presentation.      Rule out gastritis, esophagitis, Mallory Weiss tear, partial outlet      obstruction.  2. Urinary frequency.  History of overactive bladder.  Rule out      urinary tract infection.  3. Cerebral palsy, wheelchair bound.  4. History of  depression.   PLAN:  1. The patient is admitted for IV fluid hydration, serial H and H,      transfusions as indicated.  Will start him on a PPI, antiemetics as      needed for nausea, and plan upper endoscopy on December 13.  2. Contact his primary care Helane Briceno[facility] at [facility] for general      medical management.      Colin [place], PA-C      Dora M. Juanda Chance, MD  Electronically Signed    AE/MEDQ  D:  09/18/2008  T:  09/18/2008  Job:  [id number]

## 2011-02-19 NOTE — Discharge Summary (Signed)
Colin Bradley, Colin Bradley              ACCOUNT NO.:  [id number]   MEDICAL RECORD NO.:  [id number]          PATIENT TYPE:  INP   LOCATION:  1309                         FACILITY:  [facility]   PHYSICIAN:  Barbette Hair. Arlyce Dice, MD,FACGDATE OF BIRTH:  14-Aug-1951   DATE OF ADMISSION:  09/17/2008  DATE OF DISCHARGE:  09/20/2008                               DISCHARGE SUMMARY   ADMISSION DIAGNOSES:  50. A 60 year old male with nausea, vomiting and coffee-ground emesis.  2. Cerebral palsy with mental retardation.  3. Overactive bladder.  4. Chronic depression.  5. Benign prostatic hypertrophy.   DISCHARGE DIAGNOSES:  1. Resolved low-grade gastrointestinal bleed secondary to distal      esophagitis.  2. Mild anemia secondary to above.  3. No evidence for urinary tract infection.  4. Cerebral palsy with mental retardation.  5. Overactive bladder.  6. Chronic depression.  7. Benign prostatic hypertrophy.   CONSULTATIONS:  None.   PROCEDURE:  Upper endoscopy per Dr. Lina Sar on September 18, 2008.   BRIEF HISTORY:  Kyian is a pleasant 60 year old white male who lives  independently in a group home apartment situation.  He is a primary  patient of Dr. Evlyn Kanner.  He has medical problems as outlined above.  Apparently he has had problems with recurrent urinary tract infection.  He was brought to the emergency room via EMS with complaints of some  urinary frequency.  Also complaining of nausea and then vomited a large  amount of coffee-ground material in the emergency room.  He was unable  to be specific about the duration of his symptoms, but said that he had  been feeling nauseated for a couple of days.  He  also had some burning  feeling in his chest.  He was hemodynamically stable in the emergency  room.  NG was placed with dark brown return.  The patient had been  unaware of any melena or hematochezia at home.  He is not on any regular  NSAIDs or aspirin.  Hemoglobin was 13.7 on arrival.   The  patient was admitted to the service of Dr. Lina Sar for medical  management and treatment of an upper GI bleed.   LABORATORY DATA:  Laboratory studies on admit:  WBC of 10.9, hemoglobin  13.7, hematocrit of 40.3, MCV of 98.5, glucose was 126.  Electrolytes  within normal limits.  BUN 19, creatinine 1.39  on admission.  Liver  function studies normal.  Albumin 3.6.  CKs are negative.  Troponin  negative x1.  Gastroccult positive.  UA was negative.  Urine culture no  growth.  Serial H&H's were obtained.  On September 18, 2008, hemoglobin  11.7, hematocrit of 32.7.  On September 19, 2008, hemoglobin 10.9,  hematocrit 32.  On September 20, 2008, hemoglobin 11.5, hematocrit of 33.   HOSPITAL COURSE:  The patient was admitted to the service of Dr. Lina Sar.  He initially had an NG tube placed with coffee ground return.  He remained hemodynamically stable and did not manifest any further  significant bleeding.  His hemoglobin drifted, but stayed in the 11  range.  His NG tube was discontinued on September 18, 2008.  He underwent  upper endoscopy which did show long erosions with exudate in the distal  esophagus.  Random biopsies were taken.  He also had a stricture at the  GE junction.  Biopsies returned showing no ulceration and surface  fibrinous exudate.  No intestinal metaplasia.  By September 19, 2008, he  was feeling better, was able to eat without any difficulty.  He had no  complaints of nausea or abdominal discomfort.  No complaints of  dysphasia.  His hemoglobin remained stable and he is discharged on  September 20, 2008 back to the group home which is the [facility].   DISCHARGE MEDICATIONS:  1. Protonix 40 mg once daily indefinitely.  2. Other medications same as on admission, Cardura 8 mg p.o. daily.  3. Elavil 25 q.h.s.  4. Anaplex 15 daily.  5. Lexapro 20 daily.  6. Lofibra 160 daily.   FOLLOW UP:  1. He is to follow up with Dr. Evlyn Kanner who he states is his  primary care      physician as per regularly scheduled.  2. We will schedule him a followup with Dr. Lina Sar in the office      in approximately a month and can discuss esophageal dilation at      that time.  He is currently asymptomatic and does not have any      complaints of dysphasia.   CONDITION ON DISCHARGE:  Stable.      Amy Esterwood, PA-C      Robert D. Arlyce Dice, MD,FACG  Electronically Signed    AE/MEDQ  D:  09/20/2008  T:  09/20/2008  Job:  [id number]   cc:   Jeannett Senior A. Evlyn Kanner, M.D.  Fax: [phone]   Barbette Hair. Arlyce Dice, MD,FACG  [address]  [place]  [place] [address]

## 2011-02-22 ENCOUNTER — Other Ambulatory Visit: Payer: Self-pay | Admitting: Internal Medicine

## 2011-03-23 ENCOUNTER — Emergency Department (HOSPITAL_COMMUNITY)
Admission: EM | Admit: 2011-03-23 | Discharge: 2011-03-23 | Disposition: A | Discharge status: Home / Self Care | Payer: Medicare Other | Attending: Family Medicine | Admitting: Family Medicine

## 2011-03-23 DIAGNOSIS — R42 Dizziness and giddiness: Secondary | ICD-10-CM | POA: Insufficient documentation

## 2011-03-23 DIAGNOSIS — G40909 Epilepsy, unspecified, not intractable, without status epilepticus: Secondary | ICD-10-CM | POA: Insufficient documentation

## 2011-03-23 DIAGNOSIS — G809 Cerebral palsy, unspecified: Secondary | ICD-10-CM | POA: Insufficient documentation

## 2011-03-23 DIAGNOSIS — E785 Hyperlipidemia, unspecified: Secondary | ICD-10-CM | POA: Insufficient documentation

## 2011-03-23 DIAGNOSIS — F79 Unspecified intellectual disabilities: Secondary | ICD-10-CM | POA: Insufficient documentation

## 2011-03-23 LAB — URINALYSIS, ROUTINE W REFLEX MICROSCOPIC
Bilirubin Urine: NEGATIVE
Hgb urine dipstick: NEGATIVE
Ketones, ur: NEGATIVE mg/dL
Nitrite: NEGATIVE
Specific Gravity, Urine: 1.009 (ref 1.005–1.030)
pH: 7.5 (ref 5.0–8.0)

## 2011-03-23 LAB — DIFFERENTIAL
Eosinophils Relative: 5 % (ref 0–5)
Lymphocytes Relative: 27 % (ref 12–46)
Monocytes Absolute: 0.5 10*3/uL (ref 0.1–1.0)
Monocytes Relative: 9 % (ref 3–12)
Neutro Abs: 3.1 10*3/uL (ref 1.7–7.7)

## 2011-03-23 LAB — CBC
HCT: 38.4 % — ABNORMAL LOW (ref 39.0–52.0)
Hemoglobin: 13.2 g/dL (ref 13.0–17.0)
MCH: 31.8 pg (ref 26.0–34.0)
MCHC: 34.4 g/dL (ref 30.0–36.0)
MCV: 92.5 fL (ref 78.0–100.0)
RDW: 12.3 % (ref 11.5–15.5)

## 2011-03-23 LAB — BASIC METABOLIC PANEL
BUN: 13 mg/dL (ref 6–23)
Calcium: 10.2 mg/dL (ref 8.4–10.5)
Creatinine, Ser: 0.93 mg/dL (ref 0.50–1.35)
GFR calc Af Amer: >60 mL/min (ref >60)
GFR calc non Af Amer: >60 mL/min (ref >60)
Glucose, Bld: 86 mg/dL (ref 70–99)

## 2011-05-24 ENCOUNTER — Emergency Department (HOSPITAL_COMMUNITY)
Admission: EM | Admit: 2011-05-24 | Discharge: 2011-05-25 | Disposition: A | Discharge status: Home / Self Care | Payer: Medicare Other | Attending: Emergency Medicine | Admitting: Emergency Medicine

## 2011-05-24 ENCOUNTER — Emergency Department (HOSPITAL_COMMUNITY)
Admission: EM | Admit: 2011-05-24 | Discharge: 2011-05-24 | Disposition: A | Discharge status: Home / Self Care | Payer: Medicare Other | Source: Home / Self Care | Attending: Emergency Medicine | Admitting: Emergency Medicine

## 2011-05-24 DIAGNOSIS — F79 Unspecified intellectual disabilities: Secondary | ICD-10-CM | POA: Insufficient documentation

## 2011-05-24 DIAGNOSIS — F329 Major depressive disorder, single episode, unspecified: Secondary | ICD-10-CM | POA: Insufficient documentation

## 2011-05-24 DIAGNOSIS — R42 Dizziness and giddiness: Secondary | ICD-10-CM | POA: Insufficient documentation

## 2011-05-24 DIAGNOSIS — R569 Unspecified convulsions: Secondary | ICD-10-CM | POA: Insufficient documentation

## 2011-05-24 DIAGNOSIS — Z79899 Other long term (current) drug therapy: Secondary | ICD-10-CM | POA: Insufficient documentation

## 2011-05-24 DIAGNOSIS — I10 Essential (primary) hypertension: Secondary | ICD-10-CM | POA: Insufficient documentation

## 2011-05-24 DIAGNOSIS — F3289 Other specified depressive episodes: Secondary | ICD-10-CM | POA: Insufficient documentation

## 2011-05-24 DIAGNOSIS — G40909 Epilepsy, unspecified, not intractable, without status epilepticus: Secondary | ICD-10-CM | POA: Insufficient documentation

## 2011-05-24 DIAGNOSIS — G809 Cerebral palsy, unspecified: Secondary | ICD-10-CM | POA: Insufficient documentation

## 2011-05-24 LAB — URINALYSIS, ROUTINE W REFLEX MICROSCOPIC
Bilirubin Urine: NEGATIVE
Hgb urine dipstick: NEGATIVE
Ketones, ur: NEGATIVE mg/dL
Specific Gravity, Urine: 1.011 (ref 1.005–1.030)
Urobilinogen, UA: 0.2 mg/dL (ref 0.0–1.0)

## 2011-05-24 LAB — POCT I-STAT, CHEM 8
Calcium, Ion: 1.21 mmol/L (ref 1.12–1.32)
Creatinine, Ser: 0.9 mg/dL (ref 0.50–1.35)
Glucose, Bld: 89 mg/dL (ref 70–99)
Hemoglobin: 13.6 g/dL (ref 13.0–17.0)
Potassium: 3.8 mEq/L (ref 3.5–5.1)

## 2011-05-24 LAB — PHENYTOIN LEVEL, TOTAL: Phenytoin Lvl: 6.8 ug/mL — ABNORMAL LOW (ref 10.0–20.0)

## 2011-05-25 ENCOUNTER — Emergency Department (HOSPITAL_COMMUNITY): Payer: Medicare Other

## 2011-05-25 ENCOUNTER — Emergency Department (HOSPITAL_COMMUNITY)
Admission: EM | Admit: 2011-05-25 | Discharge: 2011-05-25 | Disposition: A | Discharge status: Home / Self Care | Payer: Medicare Other | Attending: Emergency Medicine | Admitting: Emergency Medicine

## 2011-05-25 DIAGNOSIS — G40909 Epilepsy, unspecified, not intractable, without status epilepticus: Secondary | ICD-10-CM | POA: Insufficient documentation

## 2011-05-25 DIAGNOSIS — Z79899 Other long term (current) drug therapy: Secondary | ICD-10-CM | POA: Insufficient documentation

## 2011-05-25 DIAGNOSIS — Y9210 Unspecified residential institution as the place of occurrence of the external cause: Secondary | ICD-10-CM | POA: Insufficient documentation

## 2011-05-25 DIAGNOSIS — M25559 Pain in unspecified hip: Secondary | ICD-10-CM | POA: Insufficient documentation

## 2011-05-25 DIAGNOSIS — T1490XA Injury, unspecified, initial encounter: Secondary | ICD-10-CM | POA: Insufficient documentation

## 2011-05-25 DIAGNOSIS — G809 Cerebral palsy, unspecified: Secondary | ICD-10-CM | POA: Insufficient documentation

## 2011-05-25 DIAGNOSIS — W06XXXA Fall from bed, initial encounter: Secondary | ICD-10-CM | POA: Insufficient documentation

## 2011-05-27 ENCOUNTER — Emergency Department (HOSPITAL_COMMUNITY): Payer: Medicare Other

## 2011-05-27 ENCOUNTER — Emergency Department (HOSPITAL_COMMUNITY)
Admission: EM | Admit: 2011-05-27 | Discharge: 2011-05-27 | Disposition: A | Discharge status: Home / Self Care | Payer: Medicare Other | Attending: Emergency Medicine | Admitting: Emergency Medicine

## 2011-05-27 DIAGNOSIS — M5030 Other cervical disc degeneration, unspecified cervical region: Secondary | ICD-10-CM | POA: Insufficient documentation

## 2011-05-27 DIAGNOSIS — R55 Syncope and collapse: Secondary | ICD-10-CM | POA: Insufficient documentation

## 2011-05-27 DIAGNOSIS — G40909 Epilepsy, unspecified, not intractable, without status epilepticus: Secondary | ICD-10-CM | POA: Insufficient documentation

## 2011-05-27 DIAGNOSIS — R109 Unspecified abdominal pain: Secondary | ICD-10-CM | POA: Insufficient documentation

## 2011-05-27 DIAGNOSIS — R5383 Other fatigue: Secondary | ICD-10-CM | POA: Insufficient documentation

## 2011-05-27 DIAGNOSIS — R5381 Other malaise: Secondary | ICD-10-CM | POA: Insufficient documentation

## 2011-05-27 DIAGNOSIS — Z79899 Other long term (current) drug therapy: Secondary | ICD-10-CM | POA: Insufficient documentation

## 2011-05-27 DIAGNOSIS — G809 Cerebral palsy, unspecified: Secondary | ICD-10-CM | POA: Insufficient documentation

## 2011-05-27 LAB — URINALYSIS, ROUTINE W REFLEX MICROSCOPIC
Ketones, ur: NEGATIVE mg/dL
Nitrite: NEGATIVE
Protein, ur: NEGATIVE mg/dL
Urobilinogen, UA: 0.2 mg/dL (ref 0.0–1.0)

## 2011-05-27 LAB — BASIC METABOLIC PANEL
BUN: 11 mg/dL (ref 6–23)
Calcium: 9.3 mg/dL (ref 8.4–10.5)
Chloride: 103 mEq/L (ref 96–112)
Creatinine, Ser: 0.75 mg/dL (ref 0.50–1.35)
GFR calc Af Amer: >60 mL/min (ref >60)
GFR calc non Af Amer: >60 mL/min (ref >60)

## 2011-05-27 LAB — CBC
MCH: 32.4 pg (ref 26.0–34.0)
MCHC: 35.5 g/dL (ref 30.0–36.0)
MCV: 91.4 fL (ref 78.0–100.0)
Platelets: 237 10*3/uL (ref 150–400)
RDW: 12.4 % (ref 11.5–15.5)

## 2011-05-27 LAB — DIFFERENTIAL
Basophils Relative: 0 % (ref 0–1)
Eosinophils Absolute: 0.2 10*3/uL (ref 0.0–0.7)
Eosinophils Relative: 4 % (ref 0–5)
Lymphs Abs: 1.3 10*3/uL (ref 0.7–4.0)
Monocytes Relative: 12 % (ref 3–12)

## 2011-07-09 LAB — CBC
HCT: 40
MCV: 96.8
Platelets: 281
WBC: 8.4

## 2011-07-09 LAB — URINALYSIS, ROUTINE W REFLEX MICROSCOPIC
Bilirubin Urine: NEGATIVE
Glucose, UA: NEGATIVE
Hgb urine dipstick: NEGATIVE
Protein, ur: NEGATIVE

## 2011-07-09 LAB — COMPREHENSIVE METABOLIC PANEL
Albumin: 3.9
BUN: 17
CO2: 29
Chloride: 105
Creatinine, Ser: 1
GFR calc non Af Amer: >60
Total Bilirubin: 0.7

## 2011-07-09 LAB — LIPASE, BLOOD: Lipase: 32

## 2011-07-09 LAB — DIFFERENTIAL
Basophils Absolute: 0
Lymphocytes Relative: 16
Neutro Abs: 6.3

## 2011-07-11 LAB — URINE CULTURE

## 2011-07-11 LAB — CBC
HCT: 40.3 % (ref 39.0–52.0)
HCT: 44.8 % (ref 39.0–52.0)
Hemoglobin: 12.4 g/dL — ABNORMAL LOW (ref 13.0–17.0)
MCHC: 33.9 g/dL (ref 30.0–36.0)
MCHC: 33.9 g/dL (ref 30.0–36.0)
MCHC: 34.1 g/dL (ref 30.0–36.0)
MCV: 98 fL (ref 78.0–100.0)
MCV: 98.5 fL (ref 78.0–100.0)
RBC: 4.09 MIL/uL — ABNORMAL LOW (ref 4.22–5.81)
RBC: 4.57 MIL/uL (ref 4.22–5.81)
RDW: 12.7 % (ref 11.5–15.5)
WBC: 10.9 10*3/uL — ABNORMAL HIGH (ref 4.0–10.5)

## 2011-07-11 LAB — URINALYSIS, ROUTINE W REFLEX MICROSCOPIC
Nitrite: NEGATIVE
Specific Gravity, Urine: 1.011 (ref 1.005–1.030)
Urobilinogen, UA: 0.2 mg/dL (ref 0.0–1.0)

## 2011-07-11 LAB — DIFFERENTIAL
Basophils Absolute: 0 10*3/uL (ref 0.0–0.1)
Basophils Relative: 0 % (ref 0–1)
Eosinophils Absolute: 0.2 10*3/uL (ref 0.0–0.7)
Eosinophils Relative: 1 % (ref 0–5)
Lymphocytes Relative: 14 % (ref 12–46)
Monocytes Relative: 10 % (ref 3–12)
Neutro Abs: 8.1 10*3/uL — ABNORMAL HIGH (ref 1.7–7.7)
Neutrophils Relative %: 74 % (ref 43–77)
Neutrophils Relative %: 78 % — ABNORMAL HIGH (ref 43–77)

## 2011-07-11 LAB — POCT CARDIAC MARKERS
CKMB, poc: <1 ng/mL — ABNORMAL LOW (ref 1.0–8.0)
Troponin i, poc: <0.05 ng/mL (ref 0.00–0.09)

## 2011-07-11 LAB — TYPE AND SCREEN
ABO/RH(D): O POS
Antibody Screen: NEGATIVE

## 2011-07-11 LAB — BASIC METABOLIC PANEL
BUN: 17 mg/dL (ref 6–23)
CO2: 27 mEq/L (ref 19–32)
CO2: 33 mEq/L — ABNORMAL HIGH (ref 19–32)
Calcium: 10.1 mg/dL (ref 8.4–10.5)
Chloride: 99 mEq/L (ref 96–112)
Creatinine, Ser: 1.27 mg/dL (ref 0.4–1.5)
GFR calc Af Amer: >60 mL/min (ref >60)
Glucose, Bld: 120 mg/dL — ABNORMAL HIGH (ref 70–99)
Sodium: 138 mEq/L (ref 135–145)

## 2011-07-11 LAB — HEMOGLOBIN AND HEMATOCRIT, BLOOD
HCT: 32 % — ABNORMAL LOW (ref 39.0–52.0)
HCT: 32.7 % — ABNORMAL LOW (ref 39.0–52.0)
Hemoglobin: 11.5 g/dL — ABNORMAL LOW (ref 13.0–17.0)

## 2011-07-11 LAB — COMPREHENSIVE METABOLIC PANEL
BUN: 19 mg/dL (ref 6–23)
CO2: 28 mEq/L (ref 19–32)
Chloride: 102 mEq/L (ref 96–112)
Creatinine, Ser: 1.39 mg/dL (ref 0.4–1.5)
GFR calc non Af Amer: 52 mL/min — ABNORMAL LOW (ref >60)
Total Bilirubin: 0.7 mg/dL (ref 0.3–1.2)

## 2011-07-11 LAB — OCCULT BLOOD X 1 CARD TO LAB, STOOL: Fecal Occult Bld: POSITIVE

## 2011-07-11 LAB — GASTRIC OCCULT BLOOD (1-CARD TO LAB): pH, Gastric: 4

## 2011-07-11 LAB — GLUCOSE, CAPILLARY

## 2011-08-23 ENCOUNTER — Other Ambulatory Visit: Payer: Self-pay | Admitting: Internal Medicine

## 2011-09-07 ENCOUNTER — Emergency Department (HOSPITAL_COMMUNITY)
Admission: EM | Admit: 2011-09-07 | Discharge: 2011-09-07 | Disposition: A | Discharge status: Home / Self Care | Payer: Medicare Other | Attending: Emergency Medicine | Admitting: Emergency Medicine

## 2011-09-07 ENCOUNTER — Encounter: Payer: Self-pay | Admitting: *Deleted

## 2011-09-07 DIAGNOSIS — R42 Dizziness and giddiness: Secondary | ICD-10-CM | POA: Insufficient documentation

## 2011-09-07 DIAGNOSIS — G809 Cerebral palsy, unspecified: Secondary | ICD-10-CM | POA: Insufficient documentation

## 2011-09-07 DIAGNOSIS — Z79899 Other long term (current) drug therapy: Secondary | ICD-10-CM | POA: Insufficient documentation

## 2011-09-07 DIAGNOSIS — G40909 Epilepsy, unspecified, not intractable, without status epilepticus: Secondary | ICD-10-CM | POA: Insufficient documentation

## 2011-09-07 HISTORY — DX: Cerebral palsy, unspecified: G80.9

## 2011-09-07 HISTORY — DX: Unspecified convulsions: R56.9

## 2011-09-07 LAB — CBC
MCV: 92.3 fL (ref 78.0–100.0)
Platelets: 238 10*3/uL (ref 150–400)
RBC: 4.15 MIL/uL — ABNORMAL LOW (ref 4.22–5.81)
WBC: 3.5 10*3/uL — ABNORMAL LOW (ref 4.0–10.5)

## 2011-09-07 LAB — BASIC METABOLIC PANEL
CO2: 26 mEq/L (ref 19–32)
Chloride: 104 mEq/L (ref 96–112)
Glucose, Bld: 126 mg/dL — ABNORMAL HIGH (ref 70–99)
Potassium: 4 mEq/L (ref 3.5–5.1)
Sodium: 138 mEq/L (ref 135–145)

## 2011-09-07 LAB — DIFFERENTIAL
Lymphocytes Relative: 42 % (ref 12–46)
Lymphs Abs: 1.5 10*3/uL (ref 0.7–4.0)
Neutrophils Relative %: 42 % — ABNORMAL LOW (ref 43–77)

## 2011-09-07 LAB — URINALYSIS, ROUTINE W REFLEX MICROSCOPIC
Glucose, UA: NEGATIVE mg/dL
Hgb urine dipstick: NEGATIVE
Specific Gravity, Urine: 1.006 (ref 1.005–1.030)

## 2011-09-07 LAB — PHENYTOIN LEVEL, TOTAL: Phenytoin Lvl: <2.5 ug/mL — ABNORMAL LOW (ref 10.0–20.0)

## 2011-09-07 MED ORDER — PHENYTOIN SODIUM EXTENDED 100 MG PO CAPS
400.0000 mg | ORAL_CAPSULE | Freq: Once | ORAL | Status: AC
  Administered 2011-09-07: 400 mg via ORAL
  Filled 2011-09-07: qty 4

## 2011-09-07 MED ORDER — PHENYTOIN 50 MG PO CHEW: 400.0000 mg | CHEWABLE_TABLET | Freq: Once | ORAL | Status: DC

## 2011-09-07 NOTE — ED Notes (Signed)
Pt comes from home with c/o "feeling funny."  Pt denies nausea and pain.

## 2011-09-07 NOTE — ED Provider Notes (Signed)
History     CSN: [id number] Arrival date & time: 09/07/2011  4:19 PM   First MD Initiated Contact with Patient 09/07/11 1620      Chief Complaint  Patient presents with  . Illegal value: [    Pt states he "feels funny"    (Consider location/radiation/quality/duration/timing/severity/associated sxs/prior treatment) HPI Patient presents to the emergency room because he been feeling funny for the last couple of hours. Patient states he can't really explain it any more than that. He had a sensation of feeling a little bit lightheaded but denies any other complaints. The patient states he got scared and wanted to come to the emergency room to get his blood tests checked. Patient denies any headache, chest pain, abdominal pain, numbness, weakness, fever, cough, vomiting or diarrhea. Patient's not having any trouble with his speech or his coordination other than the baseline amount has with his cerebral palsy. Patient states he is wheelchair-bound. Past Medical History  Diagnosis Date  . Seizures   . Cerebral palsy     History reviewed. No pertinent past surgical history.  History reviewed. No pertinent family history.  History  Substance Use Topics  . Smoking status: Not on file  . Smokeless tobacco: Not on file  . Alcohol Use:       Review of Systems  Constitutional: Negative for fever and chills.  HENT: Negative for neck pain.   Respiratory: Negative for chest tightness and shortness of breath.   Cardiovascular: Negative for chest pain.  Gastrointestinal: Negative for abdominal pain.  Neurological: Negative for seizures and numbness.  All other systems reviewed and are negative.    Allergies  Review of patient's allergies indicates no known allergies.  Home Medications   Current Outpatient Rx  Name Route Sig Dispense Refill  . AMITRIPTYLINE HCL 25 MG PO TABS Oral Take 25 mg by mouth at bedtime.      Marland Kitchen DOXAZOSIN MESYLATE 8 MG PO TABS Oral Take 8 mg by mouth at  bedtime.      Marland Kitchen ESCITALOPRAM OXALATE 20 MG PO TABS Oral Take 20 mg by mouth daily.      . FENOFIBRATE 160 MG PO TABS Oral Take 160 mg by mouth daily.      Marland Kitchen LORAZEPAM 1 MG PO TABS Oral Take 1 mg by mouth 2 (two) times daily.      . ABC PLUS SENIOR PO TABS Oral Take 1 tablet by mouth daily.      Marland Kitchen PHENYTOIN SODIUM EXTENDED 200 MG PO CAPS Oral Take 200 mg by mouth 2 (two) times daily.      . TRAMADOL HCL 50 MG PO TABS Oral Take 50 mg by mouth every 6 (six) hours as needed. Pain.Marland KitchenMarland KitchenMaximum dose= 8 tablets per day     . PANTOPRAZOLE SODIUM 40 MG PO TBEC  TAKE ONE TABLET TWICE DAILY 60 tablet 3    BP 161/79  Pulse 101  Temp(Src) 97.4 F (36.3 C) (Oral)  Resp 22  SpO2 100%  Physical Exam  Nursing note and vitals reviewed. Constitutional: He is oriented to person, place, and time. Vital signs are normal.  Non-toxic appearance. He does not appear ill. No distress.       Except hypertension  HENT:  Head: Normocephalic and atraumatic.  Right Ear: External ear normal.  Left Ear: External ear normal.  Eyes: Conjunctivae are normal. Right eye exhibits no discharge. Left eye exhibits no discharge. No scleral icterus.  Neck: Neck supple. No tracheal deviation present.  Cardiovascular: Normal  rate, regular rhythm, normal heart sounds and intact distal pulses.   Pulmonary/Chest: Effort normal and breath sounds normal. No stridor. No respiratory distress. He has no wheezes. He has no rales.  Abdominal: Soft. Bowel sounds are normal. He exhibits no distension. There is no tenderness. There is no rebound and no guarding.  Musculoskeletal: He exhibits no edema and no tenderness.  Lymphadenopathy:    He has no cervical adenopathy.  Neurological: He is alert and oriented to person, place, and time. He has normal strength. No sensory deficit. Cranial nerve deficit:  no gross defecits noted. He exhibits abnormal muscle tone. He displays no seizure activity. Coordination abnormal.       5/5 ue strength  bilaterally, limited ROM bilateral lower extrem, atrophy, spasticity noted  Skin: Skin is warm and dry. No rash noted.  Psychiatric: He has a normal mood and affect.    ED Course  Procedures (including critical care time)  Date: 09/07/2011  Rate: 79  Rhythm: normal sinus rhythm  QRS Axis: normal  Intervals: normal  ST/T Wave abnormalities: normal  Conduction Disutrbances:right bundle branch block  Narrative Interpretation:   Old EKG Reviewed: unchanged   Labs Reviewed  CBC - Abnormal; Notable for the following:    WBC 3.5 (*)    RBC 4.15 (*)    HCT 38.3 (*)    All other components within normal limits  DIFFERENTIAL - Abnormal; Notable for the following:    Neutrophils Relative 42 (*)    Neutro Abs 1.5 (*)    All other components within normal limits  BASIC METABOLIC PANEL  URINALYSIS, ROUTINE W REFLEX MICROSCOPIC  PHENYTOIN LEVEL, TOTAL   No results found.     MDM   Patient with normal workup in the ED. He is asymptomatic this point and he would like to go home. Patient has vague complaint of lightheadedness. There is no evidence to suggest stroke. He is not anemic. Electrolytes otherwise appear normal. Dilantin is subtherapeutic however and I will give him a dose of Dilantin orally while he is here.    Celene Kras, MD 09/07/11 1901

## 2011-09-07 NOTE — ED Notes (Signed)
[facility] called for pt transfer home  

## 2011-09-07 NOTE — ED Notes (Signed)
NWG:[id number]<ZH> Expected date:09/07/11<BR> Expected time: 3:59 PM<BR> Means of arrival:Ambulance<BR> Comments:<BR> EMS 32 GC, 53 yom multiple complaints

## 2011-12-21 ENCOUNTER — Encounter (HOSPITAL_COMMUNITY): Payer: Self-pay | Admitting: *Deleted

## 2011-12-21 ENCOUNTER — Emergency Department (HOSPITAL_COMMUNITY)
Admission: EM | Admit: 2011-12-21 | Discharge: 2011-12-21 | Disposition: A | Discharge status: Home / Self Care | Payer: Medicare Other | Attending: Emergency Medicine | Admitting: Emergency Medicine

## 2011-12-21 DIAGNOSIS — R5381 Other malaise: Secondary | ICD-10-CM | POA: Insufficient documentation

## 2011-12-21 DIAGNOSIS — R531 Weakness: Secondary | ICD-10-CM

## 2011-12-21 DIAGNOSIS — E876 Hypokalemia: Secondary | ICD-10-CM

## 2011-12-21 DIAGNOSIS — G809 Cerebral palsy, unspecified: Secondary | ICD-10-CM | POA: Insufficient documentation

## 2011-12-21 DIAGNOSIS — Z79899 Other long term (current) drug therapy: Secondary | ICD-10-CM | POA: Insufficient documentation

## 2011-12-21 DIAGNOSIS — G40909 Epilepsy, unspecified, not intractable, without status epilepticus: Secondary | ICD-10-CM | POA: Insufficient documentation

## 2011-12-21 LAB — POCT I-STAT, CHEM 8
Glucose, Bld: 101 mg/dL — ABNORMAL HIGH (ref 70–99)
HCT: 41 % (ref 39.0–52.0)
Hemoglobin: 13.9 g/dL (ref 13.0–17.0)
Potassium: 3.4 mEq/L — ABNORMAL LOW (ref 3.5–5.1)
Sodium: 143 mEq/L (ref 135–145)
TCO2: 20 mmol/L (ref 0–100)

## 2011-12-21 LAB — URINALYSIS, ROUTINE W REFLEX MICROSCOPIC
Bilirubin Urine: NEGATIVE
Nitrite: NEGATIVE
Specific Gravity, Urine: 1.025 (ref 1.005–1.030)
Urobilinogen, UA: 1 mg/dL (ref 0.0–1.0)

## 2011-12-21 MED ORDER — PANTOPRAZOLE SODIUM 40 MG IV SOLR: 40.0000 mg | Freq: Once | INTRAVENOUS | Status: DC

## 2011-12-21 MED ORDER — POTASSIUM CHLORIDE ER 10 MEQ PO TBCR: 20.0000 meq | EXTENDED_RELEASE_TABLET | Freq: Two times a day (BID) | ORAL | Status: DC

## 2011-12-21 MED ORDER — SODIUM CHLORIDE 0.9 % IV BOLUS (SEPSIS): 1000.0000 mL | Freq: Once | INTRAVENOUS | Status: DC

## 2011-12-21 MED ORDER — FENTANYL CITRATE 0.05 MG/ML IJ SOLN: 100.0000 ug | Freq: Once | INTRAMUSCULAR | Status: DC

## 2011-12-21 MED ORDER — POTASSIUM CHLORIDE CRYS ER 20 MEQ PO TBCR
20.0000 meq | EXTENDED_RELEASE_TABLET | Freq: Once | ORAL | Status: AC
  Administered 2011-12-21: 20 meq via ORAL
  Filled 2011-12-21: qty 1

## 2011-12-21 MED ORDER — ONDANSETRON HCL 4 MG/2ML IJ SOLN: 4.0000 mg | Freq: Once | INTRAMUSCULAR | Status: DC

## 2011-12-21 NOTE — ED Notes (Signed)
Per EMS: patient sts he has been feeling weak since 3 hours ago. No N/V/D. No cough. Patient sts sudden onset.

## 2011-12-21 NOTE — Discharge Instructions (Signed)
Foods Rich in Potassium Food / Potassium (mg)  Apricots, dried,  cup / 378 mg   Apricots, raw, 1 cup halves / 401 mg   Avocado,  / 487 mg   Banana, 1 large / 487 mg   Beef, lean, round, 3 oz / 202 mg   Cantaloupe, 1 cup cubes / 427 mg   Dates, medjool, 5 whole / 835 mg   Ham, cured, 3 oz / 212 mg   Lentils, dried,  cup / 458 mg   Lima beans, frozen,  cup / 258 mg   Orange, 1 large / 333 mg   Orange juice, 1 cup / 443 mg   Peaches, dried,  cup / 398 mg   Peas, split, cooked,  cup / 355 mg   Potato, boiled, 1 medium / 515 mg   Prunes, dried, uncooked,  cup / 318 mg   Raisins,  cup / 309 mg   Salmon, pink, raw, 3 oz / 275 mg   Sardines, canned , 3 oz / 338 mg   Tomato, raw, 1 medium / 292 mg   Tomato juice, 6 oz / 417 mg   [place], 3 oz / 349 mg  Document Released: 09/23/2005 Document Revised: 06/05/2011 Document Reviewed: 02/06/2009 [facility] Patient Information 2012 [place], [place].Hypokalemia Hypokalemia means a low potassium level in the blood.Potassium is an electrolyte that helps regulate the amount of fluid in the body. It also stimulates muscle contraction and maintains a stable acid-base balance.Most of the body's potassium is inside of cells, and only a very small amount is in the blood. Because the amount in the blood is so small, minor changes can have big effects. PREPARATION FOR TEST Testing for potassium requires taking a blood sample taken by needle from a vein in the arm. The skin is cleaned thoroughly before the sample is drawn. There is no other special preparation needed. NORMAL VALUES Potassium levels below 3.5 mEq/L are abnormally low. Levels above 5.1 mEq/L are abnormally high. Ranges for normal findings may vary among different laboratories and hospitals. You should always check with your doctor after having lab work or other tests done to discuss the meaning of your test results and whether your values are considered within normal  limits. MEANING OF TEST  Your caregiver will go over the test results with you and discuss the importance and meaning of your results, as well as treatment options and the need for additional tests, if necessary. A potassium level is frequently part of a routine medical exam. It is usually included as part of a whole "panel" of tests for several blood salts (such as Sodium and Chloride). It may be done as part of follow-up when a low potassium level was found in the past or other blood salts are suspected of being out of balance. A low potassium level might be suspected if you have one or more of the following:  Symptoms of weakness.   Abnormal heart rhythms.   High blood pressure and are taking medication to control this, especially water pills (diuretics).   Kidney disease that can affect your potassium level .   Diabetes requiring the use of insulin. The potassium may fall after taking insulin, especially if the diabetes had been out of control for a while.   A condition requiring the use of cortisone-type medication or certain types of antibiotics.   Vomiting and/or diarrhea for more than a day or two.   A stomach or intestinal condition that may not permit  appropriate absorption of potassium.   Fainting episodes.   Mental confusion.  OBTAINING TEST RESULTS It is your responsibility to obtain your test results. Ask the lab or department performing the test when and how you will get your results.  Please contact your caregiver directly if you have not received the results within one week. At that time, ask if there is anything different or new you should be doing in relation to the results. TREATMENT Hypokalemia can be treated with potassium supplements taken by mouth and/or adjustments in your current medications. A diet high in potassium is also helpful. Foods with high potassium content are:  Peas, lentils, lima beans, nuts, and dried fruit.   Whole grain and bran cereals  and breads.   Fresh fruit, vegetables (bananas, cantaloupe, grapefruit, oranges, tomatoes, honeydew melons, potatoes).   Orange and tomato juices.   Meats. If potassium supplement has been prescribed for you today or your medications have been adjusted, see your personal caregiver in time02 for a re-check.  SEEK MEDICAL CARE IF:  There is a feeling of worsening weakness.   You experience repeated chest palpitations.   You are diabetic and having difficulty keeping your blood sugars in the normal range.   You are experiencing vomiting and/or diarrhea.   You are having difficulty with any of your regular medications.  SEEK IMMEDIATE MEDICAL CARE IF:  You experience chest pain, shortness of breath, or episodes of dizziness.   You have been having vomiting or diarrhea for more than 2 days.   You have a fainting episode.  MAKE SURE YOU:   Understand these instructions.   Will watch your condition.   Will get help right away if you are not doing well or get worse.  Document Released: 09/23/2005 Document Revised: 09/12/2011 Document Reviewed: 09/03/2008 [facility] Patient Information 2012 [place], [place].

## 2011-12-21 NOTE — ED Notes (Signed)
Bed:WHALA<BR> Expected date:12/21/11<BR> Expected time: 7:59 PM<BR> Means of arrival:Ambulance<BR> Comments:<BR> EMS M212 GC -- Generalized Weakness

## 2011-12-21 NOTE — ED Notes (Signed)
Patient discharge to home and transported by [facility]. Patient understands to get prescriptions filled to avoid recurrence of weakness.

## 2011-12-22 NOTE — ED Provider Notes (Addendum)
History     CSN: [id number]  Arrival date & time 12/21/11  2020   First MD Initiated Contact with Patient 12/21/11 2114      Chief Complaint  Patient presents with  . Weakness    Sudden onset     HPI Per EMS: patient sts he has been feeling weak since 3 hours ago. No N/V/D. No cough. Patient sts sudden onset. Similar complaints in past   No known fever  Weakness is general and not focal    Past Medical History  Diagnosis Date  . Seizures   . Cerebral palsy     History reviewed. No pertinent past surgical history.  History reviewed. No pertinent family history.  History  Substance Use Topics  . Smoking status: Not on file  . Smokeless tobacco: Not on file  . Alcohol Use:       Review of Systems  All other systems reviewed and are negative.    Allergies  Review of patient's allergies indicates no known allergies.  Home Medications   Current Outpatient Rx  Name Route Sig Dispense Refill  . AMITRIPTYLINE HCL 25 MG PO TABS Oral Take 25 mg by mouth at bedtime.      Marland Kitchen CLONIDINE HCL 0.2 MG PO TABS Oral Take 0.2 mg by mouth 2 (two) times daily.    . DUTASTERIDE-TAMSULOSIN HCL 0.5-0.4 MG PO CAPS Oral Take 1 tablet by mouth daily.    Marland Kitchen ESCITALOPRAM OXALATE 20 MG PO TABS Oral Take 20 mg by mouth daily.      . FENOFIBRATE 160 MG PO TABS Oral Take 160 mg by mouth daily.      Marland Kitchen LACOSAMIDE 100 MG PO TABS Oral Take 1 tablet by mouth 2 (two) times daily.    . ABC PLUS SENIOR PO TABS Oral Take 1 tablet by mouth daily.      Marland Kitchen PANTOPRAZOLE SODIUM 40 MG PO TBEC  TAKE ONE TABLET TWICE DAILY 60 tablet 3  . PRAZOSIN HCL 2 MG PO CAPS Oral Take 2 mg by mouth daily.    . TRAMADOL HCL 50 MG PO TABS Oral Take 50 mg by mouth every 4 (four) hours as needed. For pain.Marland KitchenMarland KitchenMaximum dose= 8 tablets per day    . BALMEX EX Topical Apply 1 application topically 2 (two) times daily as needed. To buttock    . POTASSIUM CHLORIDE ER 10 MEQ PO TBCR Oral Take 2 tablets (20 mEq total) by mouth 2 (two)  times daily. 20 tablet 0    BP 161/88  Pulse 99  Temp(Src) 98.7 F (37.1 C) (Oral)  Resp 16  SpO2 97%  Physical Exam  Nursing note and vitals reviewed. Constitutional: He is oriented to person, place, and time. He appears well-developed and well-nourished. No distress.  HENT:  Head: Normocephalic and atraumatic.  Eyes: Pupils are equal, round, and reactive to light.  Neck: Normal range of motion. Neck supple.  Cardiovascular: Normal rate and intact distal pulses.   Pulmonary/Chest: No respiratory distress.  Abdominal: Soft. Normal appearance and bowel sounds are normal. He exhibits no distension. There is no tenderness. There is no rebound.  Musculoskeletal: Normal range of motion.  Neurological: He is alert and oriented to person, place, and time. No cranial nerve deficit.  Skin: Skin is warm. No rash noted. He is not diaphoretic.  Psychiatric: He has a normal mood and affect. His behavior is normal.    ED Course  Procedures (including critical care time)  Labs Reviewed  POCT I-STAT, CHEM  8 - Abnormal; Notable for the following:    Potassium 3.4 (*)    Glucose, Bld 101 (*)    All other components within normal limits  URINALYSIS, ROUTINE W REFLEX MICROSCOPIC  POCT I-STAT TROPONIN I  LAB REPORT - SCANNED   No results found.   Hypokalemia Weakness    MDM          Nelia Shi, MD 12/22/11 1004  Nelia Shi, MD 12/22/11 1005

## 2011-12-31 ENCOUNTER — Other Ambulatory Visit: Payer: Self-pay | Admitting: Internal Medicine

## 2012-01-29 ENCOUNTER — Emergency Department (HOSPITAL_COMMUNITY)
Admission: EM | Admit: 2012-01-29 | Discharge: 2012-01-29 | Disposition: A | Discharge status: Home Health | Payer: Medicare Other | Attending: Emergency Medicine | Admitting: Emergency Medicine

## 2012-01-29 ENCOUNTER — Emergency Department (HOSPITAL_COMMUNITY): Payer: Medicare Other

## 2012-01-29 ENCOUNTER — Encounter (HOSPITAL_COMMUNITY): Payer: Self-pay | Admitting: *Deleted

## 2012-01-29 DIAGNOSIS — W19XXXA Unspecified fall, initial encounter: Secondary | ICD-10-CM

## 2012-01-29 DIAGNOSIS — G809 Cerebral palsy, unspecified: Secondary | ICD-10-CM | POA: Insufficient documentation

## 2012-01-29 DIAGNOSIS — S1093XA Contusion of unspecified part of neck, initial encounter: Secondary | ICD-10-CM | POA: Insufficient documentation

## 2012-01-29 DIAGNOSIS — W050XXA Fall from non-moving wheelchair, initial encounter: Secondary | ICD-10-CM | POA: Insufficient documentation

## 2012-01-29 DIAGNOSIS — S0003XA Contusion of scalp, initial encounter: Secondary | ICD-10-CM | POA: Insufficient documentation

## 2012-01-29 DIAGNOSIS — G40909 Epilepsy, unspecified, not intractable, without status epilepticus: Secondary | ICD-10-CM | POA: Insufficient documentation

## 2012-01-29 DIAGNOSIS — M533 Sacrococcygeal disorders, not elsewhere classified: Secondary | ICD-10-CM | POA: Insufficient documentation

## 2012-01-29 DIAGNOSIS — Y9210 Unspecified residential institution as the place of occurrence of the external cause: Secondary | ICD-10-CM | POA: Insufficient documentation

## 2012-01-29 DIAGNOSIS — S0001XA Abrasion of scalp, initial encounter: Secondary | ICD-10-CM

## 2012-01-29 DIAGNOSIS — IMO0002 Reserved for concepts with insufficient information to code with codable children: Secondary | ICD-10-CM | POA: Insufficient documentation

## 2012-01-29 DIAGNOSIS — Z79899 Other long term (current) drug therapy: Secondary | ICD-10-CM | POA: Insufficient documentation

## 2012-01-29 DIAGNOSIS — S0100XA Unspecified open wound of scalp, initial encounter: Secondary | ICD-10-CM | POA: Insufficient documentation

## 2012-01-29 MED ORDER — TETANUS-DIPHTH-ACELL PERTUSSIS 5-2.5-18.5 LF-MCG/0.5 IM SUSP
0.5000 mL | Freq: Once | INTRAMUSCULAR | Status: AC
  Administered 2012-01-29: 0.5 mL via INTRAMUSCULAR
  Filled 2012-01-29: qty 0.5

## 2012-01-29 NOTE — ED Notes (Signed)
WUJ:[id number]<JY> Expected date:01/29/12<BR> Expected time:<BR> Means of arrival:<BR> Comments:<BR> EMS 50 GC - fall

## 2012-01-29 NOTE — ED Provider Notes (Signed)
Medical screening examination/treatment/procedure(s) were conducted as a shared visit with non-physician practitioner(s) and myself.  I personally evaluated the patient during the encounter  Pt s/p fall.  Alert and oriented in the ED.  No distress.  No sign of serious injury.  Will dc back to nursing facility.  Celene Kras, MD 01/29/12 2137

## 2012-01-29 NOTE — Discharge Instructions (Signed)
Your x-rays do not show any signs for broken bones today. Please ask for assistance when transferring from your wheelchair. Continue to followup with your primary care provider for general medical care. Return to the emergency room she have any worsening symptoms, pain, dizziness, lightheadedness, persistent nausea vomiting.   Abrasions Abrasions are skin scrapes. Their treatment depends on how large and deep the abrasion is. Abrasions do not extend through all layers of the skin. A cut or lesion through all skin layers is called a laceration. HOME CARE INSTRUCTIONS   If you were given a dressing, change it at least once a day or as instructed by your caregiver. If the bandage sticks, soak it off with a solution of water or hydrogen peroxide.   Twice a day, wash the area with soap and water to remove all the cream/ointment. You may do this in a sink, under a tub faucet, or in a shower. Rinse off the soap and pat dry with a clean towel. Look for signs of infection (see below).   Reapply cream/ointment according to your caregiver's instruction. This will help prevent infection and keep the bandage from sticking. Telfa or gauze over the wound and under the dressing or wrap will also help keep the bandage from sticking.   If the bandage becomes wet, dirty, or develops a foul smell, change it as soon as possible.   Only take over-the-counter or prescription medicines for pain, discomfort, or fever as directed by your caregiver.  SEEK IMMEDIATE MEDICAL CARE IF:   Increasing pain in the wound.   Signs of infection develop: redness, swelling, surrounding area is tender to touch, or pus coming from the wound.   You have a fever.   Any foul smell coming from the wound or dressing.  Most skin wounds heal within ten days. Facial wounds heal faster. However, an infection may occur despite proper treatment. You should have the wound checked for signs of infection within 24 to 48 hours or sooner if  problems arise. If you were not given a wound-check appointment, look closely at the wound yourself on the second day for early signs of infection listed above. MAKE SURE YOU:   Understand these instructions.   Will watch your condition.   Will get help right away if you are not doing well or get worse.  Document Released: 07/03/2005 Document Revised: 09/12/2011 Document Reviewed: 08/27/2011 [facility] Patient Information 2012 [place], [place].   Facial and Scalp Contusions You have a contusion (bruise) on your face or scalp. Injuries around the face and head generally cause a lot of swelling, especially around the eyes. This is because the blood supply to this area is good and tissues are loose. Swelling from a contusion is usually better in 2-3 days. It may take a week or longer for a "black eye" to clear up completely. HOME CARE INSTRUCTIONS   Apply ice packs to the injured area for about 15 to 20 minutes, 3 to 4 times a day, for the first couple days. This helps keep swelling down.   Use mild pain medicine as needed or instructed by your caregiver.   You may have a mild headache, slight dizziness, nausea, and weakness for a few days. This usually clears up with bed rest and mild pain medications.   Contact your caregiver if you are concerned about facial defects or have any difficulty with your bite or develop pain with chewing.  SEEK IMMEDIATE MEDICAL CARE IF:  You develop severe pain or a  headache, unrelieved by medication.   You develop unusual sleepiness, confusion, personality changes, or vomiting.   You have a persistent nosebleed, double or blurred vision, or drainage from the nose or ear.   You have difficulty walking or using your arms or legs.  MAKE SURE YOU:   Understand these instructions.   Will watch your condition.   Will get help right away if you are not doing well or get worse.  Document Released: 10/31/2004 Document Revised: 09/12/2011 Document Reviewed:  08/09/2011 [facility] Patient Information 2012 [place], [place].   RESOURCE GUIDE  Dental Problems  Patients with Medicaid: [facility] [phone] [address]                                           [phone] [address] Phone:  [phone]                                                  Phone:  [phone]  If unable to pay or uninsured, contact:  [facility] or [facility]. to become qualified for the adult dental clinic.  Chronic Pain Problems Contact [facility]  [phone] Patients need to be referred by their primary care doctor.  Insufficient Money for Medicine Contact [employer]:  call "211" or [facility] [phone].  No Primary Care Doctor Call [facility]  [phone] Other agencies that provide inexpensive medical care    [facility]  [phone]    [facility] Medicine  [phone]    [facility]  [phone]    [facility]  [phone]    [facility]  [phone]    [facility]  [phone]  Psychological Services [facility] Behavioral [facility]  [phone] [facility] Services  [phone] [facility]   [phone] (emergency services [phone])  Substance Abuse Resources [facility]  [phone] [facility] [phone] [facility] [place] [phone] Floydene Flock [phone] Residential & Outpatient Substance Abuse Program  ([phone]  Abuse/Neglect [facility] Child Abuse Hotline 519-742-9831 [facility] Child Abuse Hotline 671-773-7061 (After Hours)  Emergency Shelter [facility] ([phone]  [facility] [place] [facility] (201) [phone] [facility] [phone]  MRSA Hotline #:   902-410-9179    [facility] Resources  [facility]     [employer]                          [facility]  Dept. [address] [place]                       [address]      [address]  Patrecia Pace  [facility] Phone:  [phone]                                   Phone:  [phone]                 Phone:  [place] Phone:  [place] [phone] (After Hours)

## 2012-01-29 NOTE — ED Provider Notes (Signed)
History     CSN: [id number]  Arrival date & time 01/29/12  1925   First MD Initiated Contact with Patient 01/29/12 2006      Chief Complaint  Patient presents with  . Tailbone Pain    pt was attempting to get up to use bathroom and fell onto buttocks.  . Head Laceration    pt with small laceration to top of left side of head.     HPI  History provided by the patient. Patient is a 61 year old male with history of cerebral palsy and seizures who presents from [facility] living after a fall. Patient normally moves by automated wheelchair and reports that he was going into the bathroom on his own and attempting to transfer himself from his chair to the toilet. As patient grabbed the handrail he slipped causing him to fall and miss the toilet. Patient is a poor historian and unable to report complete incident. He denies having LOC. He did have injury to left head with small laceration. Patient denies headache. He denies any neck pain or stiffness. He does report some pain in the bottom and sacral area. he denies any pain or injury to extremities. Patient has no other complaints at this time.    Past Medical History  Diagnosis Date  . Seizures   . Cerebral palsy     History reviewed. No pertinent past surgical history.  History reviewed. No pertinent family history.  History  Substance Use Topics  . Smoking status: Not on file  . Smokeless tobacco: Not on file  . Alcohol Use:       Review of Systems  HENT: Negative for neck pain and neck stiffness.   Respiratory: Negative for cough and shortness of breath.   Cardiovascular: Negative for chest pain.  Gastrointestinal: Negative for abdominal pain.  Musculoskeletal: Negative for back pain and joint swelling.  Neurological: Negative for dizziness, light-headedness and headaches.    Allergies  Review of patient's allergies indicates no known allergies.  Home Medications   Current Outpatient Rx  Name Route Sig  Dispense Refill  . AMITRIPTYLINE HCL 25 MG PO TABS Oral Take 25 mg by mouth at bedtime.      Marland Kitchen CLONIDINE HCL 0.2 MG PO TABS Oral Take 0.2 mg by mouth 2 (two) times daily.    . DUTASTERIDE-TAMSULOSIN HCL 0.5-0.4 MG PO CAPS Oral Take 1 tablet by mouth daily.    Marland Kitchen ESCITALOPRAM OXALATE 20 MG PO TABS Oral Take 20 mg by mouth daily.      . FENOFIBRATE 160 MG PO TABS Oral Take 160 mg by mouth daily.      Marland Kitchen LACOSAMIDE 100 MG PO TABS Oral Take 1 tablet by mouth 2 (two) times daily.    . ABC PLUS SENIOR PO TABS Oral Take 1 tablet by mouth daily.      Marland Kitchen PANTOPRAZOLE SODIUM 40 MG PO TBEC  TAKE ONE TABLET TWICE DAILY 60 tablet 3  . POTASSIUM CHLORIDE ER 10 MEQ PO TBCR Oral Take 2 tablets (20 mEq total) by mouth 2 (two) times daily. 20 tablet 0  . PRAZOSIN HCL 2 MG PO CAPS Oral Take 2 mg by mouth daily.    . TRAMADOL HCL 50 MG PO TABS Oral Take 50 mg by mouth every 4 (four) hours as needed. For pain.Marland KitchenMarland KitchenMaximum dose= 8 tablets per day    . BALMEX EX Topical Apply 1 application topically 2 (two) times daily as needed. To buttock      BP 170/107  Pulse 107  Temp(Src) 98.1 F (36.7 C) (Oral)  Resp 18  SpO2 100%  Physical Exam  Nursing note and vitals reviewed. Constitutional: He is oriented to person, place, and time. He appears well-developed and well-nourished.  HENT:  Head: Normocephalic.       3 cm hematoma with small laceration to left scalp. No battle sign or raccoon eyes.  Eyes: Conjunctivae and EOM are normal. Pupils are equal, round, and reactive to light.  Neck: Normal range of motion. Neck supple.       No cervical midline tenderness.  Cardiovascular: Normal rate and regular rhythm.   Pulmonary/Chest: Effort normal and breath sounds normal.  Abdominal: Soft. He exhibits no distension. There is no tenderness. There is no rebound and no guarding.  Musculoskeletal:       Back:       Lower extremities with atrophy. Movement equal in both feet. No bruising or deformity. Lower extremities  appear at baseline. Normal movement of upper extremities. No pain, swelling or deformity. Normal sensations. Tenderness over sacrum and coccyx area. Skin appears normal.  Neurological: He is alert and oriented to person, place, and time.  Skin: Skin is warm. No rash noted.  Psychiatric: He has a normal mood and affect.    ED Course  Procedures   Dg Sacrum/coccyx  01/29/2012  *RADIOLOGY REPORT*  Clinical Data: Tail bone pain after fall.  SACRUM AND COCCYX - 2+ VIEW  Comparison: 05/25/2011  Findings: Old deformity of the pelvis with apparent atrophy or abnormal positioning of the right pelvis and pubic diastases.  This is stable since the prior study.  Sacral struts appear intact. Angulation of the sacral coccygeal spine appears stable since the previous CT abdomen and pelvis from 03/14/2010.  No focal cortical disruption.  No focal bone lesion or bone destruction.  Bone cortex and trabecular architecture appear intact.  IMPRESSION: Chronic pelvic deformity appears stable since previous studies.  No sacral coccygeal fractures appreciated.  Original Report Authenticated By: Marlon Pel, M.D.   Ct Head Wo Contrast  01/29/2012  *RADIOLOGY REPORT*  Clinical Data:  Fall.  Laceration to the head.  Pain.  CT HEAD WITHOUT CONTRAST CT CERVICAL SPINE WITHOUT CONTRAST  Technique:  Multidetector CT imaging of the head and cervical spine was performed following the standard protocol without intravenous contrast.  Multiplanar CT image reconstructions of the cervical spine were also generated.  Comparison:  CT head and cervical spine 05/27/2011.  CT HEAD  Findings: Dense calcifications in the basal ganglia bilaterally are stable.  There are scattered calcifications in the frontal lobes bilaterally.  Mild periventricular white matter hypoattenuation is stable.  No acute cortical infarct, hemorrhage, or mass lesion is present.  The ventricles are of normal size.  No significant extra-axial fluid collection is  present.  Mild soft tissue swelling is present in the high left frontal scalp.  There is no underlying fracture.  The paranasal sinuses and mastoid air cells are clear.  The osseous skull is intact.  IMPRESSION: One Soft tissue swelling over the high left frontal scalp without underlying fracture. 2.  Stable atrophy and minimal white matter disease. 3.  Extensive calcifications of the basal ganglia and frontal lobe white matter are stable.  CT CERVICAL SPINE  Findings: The cervical spine is imaged from skull base through T2- 3.  Mild degenerative changes of the cervical spine are most pronounced at C6-7, as before.  The vertebral body heights and alignment maintained.  No acute fracture or traumatic subluxation  is present.  Multilevel facet degenerative changes are worse left than right.  The soft tissues are unremarkable. The lung apices are clear.  IMPRESSION:  1.  Moderate spondylosis of the cervical spine is stable. 2.  No acute fracture or traumatic subluxation.  Original Report Authenticated By: Jamesetta Orleans. MATTERN, M.D.   Ct Cervical Spine Wo Contrast  01/29/2012  *RADIOLOGY REPORT*  Clinical Data:  Fall.  Laceration to the head.  Pain.  CT HEAD WITHOUT CONTRAST CT CERVICAL SPINE WITHOUT CONTRAST  Technique:  Multidetector CT imaging of the head and cervical spine was performed following the standard protocol without intravenous contrast.  Multiplanar CT image reconstructions of the cervical spine were also generated.  Comparison:  CT head and cervical spine 05/27/2011.  CT HEAD  Findings: Dense calcifications in the basal ganglia bilaterally are stable.  There are scattered calcifications in the frontal lobes bilaterally.  Mild periventricular white matter hypoattenuation is stable.  No acute cortical infarct, hemorrhage, or mass lesion is present.  The ventricles are of normal size.  No significant extra-axial fluid collection is present.  Mild soft tissue swelling is present in the high left frontal  scalp.  There is no underlying fracture.  The paranasal sinuses and mastoid air cells are clear.  The osseous skull is intact.  IMPRESSION: One Soft tissue swelling over the high left frontal scalp without underlying fracture. 2.  Stable atrophy and minimal white matter disease. 3.  Extensive calcifications of the basal ganglia and frontal lobe white matter are stable.  CT CERVICAL SPINE  Findings: The cervical spine is imaged from skull base through T2- 3.  Mild degenerative changes of the cervical spine are most pronounced at C6-7, as before.  The vertebral body heights and alignment maintained.  No acute fracture or traumatic subluxation is present.  Multilevel facet degenerative changes are worse left than right.  The soft tissues are unremarkable. The lung apices are clear.  IMPRESSION:  1.  Moderate spondylosis of the cervical spine is stable. 2.  No acute fracture or traumatic subluxation.  Original Report Authenticated By: Jamesetta Orleans. MATTERN, M.D.     1. Fall   2. Scalp abrasion   3. Scalp hematoma       MDM  8:30PM patient seen and evaluated. Patient in no acute distress. Patient is not requesting anything to treat his symptoms. Patient unsure of last tetanus shot.  Patient seen and discussed with attending physician. He agrees with plan.        Angus Seller, [place] 01/30/12 [phone]

## 2012-01-29 NOTE — ED Notes (Signed)
ptar called awaiting arriavla.

## 2012-01-29 NOTE — ED Notes (Signed)
Pt brought in via ems and made comfortable.

## 2012-01-31 NOTE — ED Provider Notes (Signed)
Medical screening examination/treatment/procedure(s) were performed by non-physician practitioner and as supervising physician I was immediately available for consultation/collaboration.    Donnika Kucher R Jannet Calip, MD 01/31/12 1533 

## 2012-03-27 ENCOUNTER — Emergency Department (HOSPITAL_COMMUNITY): Payer: Medicare Other

## 2012-03-27 ENCOUNTER — Emergency Department (HOSPITAL_COMMUNITY)
Admission: EM | Admit: 2012-03-27 | Discharge: 2012-03-27 | Disposition: A | Discharge status: Home / Self Care | Payer: Medicare Other | Attending: Emergency Medicine | Admitting: Emergency Medicine

## 2012-03-27 DIAGNOSIS — G809 Cerebral palsy, unspecified: Secondary | ICD-10-CM | POA: Insufficient documentation

## 2012-03-27 DIAGNOSIS — R42 Dizziness and giddiness: Secondary | ICD-10-CM

## 2012-03-27 LAB — POCT I-STAT, CHEM 8
Chloride: 103 mEq/L (ref 96–112)
Creatinine, Ser: 1.2 mg/dL (ref 0.50–1.35)
Glucose, Bld: 102 mg/dL — ABNORMAL HIGH (ref 70–99)
HCT: 38 % — ABNORMAL LOW (ref 39.0–52.0)
Hemoglobin: 12.9 g/dL — ABNORMAL LOW (ref 13.0–17.0)
Potassium: 3.7 mEq/L (ref 3.5–5.1)
Sodium: 142 mEq/L (ref 135–145)

## 2012-03-27 LAB — URINALYSIS, ROUTINE W REFLEX MICROSCOPIC
Bilirubin Urine: NEGATIVE
Glucose, UA: NEGATIVE mg/dL
Ketones, ur: NEGATIVE mg/dL
Leukocytes, UA: NEGATIVE
Protein, ur: NEGATIVE mg/dL

## 2012-03-27 LAB — POCT I-STAT TROPONIN I

## 2012-03-27 MED ORDER — SODIUM CHLORIDE 0.9 % IV BOLUS (SEPSIS)
500.0000 mL | Freq: Once | INTRAVENOUS | Status: AC
  Administered 2012-03-27: 500 mL via INTRAVENOUS

## 2012-03-27 NOTE — ED Notes (Signed)
Started feeling dizzy 3 hrs. Ago. Roommate called ems 172/102, cbg 124, rt. Bbb, v5 1mm st elevation; 20 piv lt. A/c

## 2012-03-27 NOTE — Discharge Instructions (Signed)

## 2012-03-27 NOTE — ED Provider Notes (Signed)
History     CSN: [id number]  Arrival date & time 03/27/12  1049   First MD Initiated Contact with Patient 03/27/12 1111      Chief Complaint  Patient presents with  . Dizziness    (Consider location/radiation/quality/duration/timing/severity/associated sxs/prior treatment) HPI Pt with history of CP states he was taking off his pants and bent down from seated position and then sat back up. He felt lightheaded. No visual changes, weakness, N/V, CP, SOB. Pt is symptom-free currently and is asking for a hamburger and fries.  Past Medical History  Diagnosis Date  . Seizures   . Cerebral palsy     No past surgical history on file.  No family history on file.  History  Substance Use Topics  . Smoking status: Not on file  . Smokeless tobacco: Not on file  . Alcohol Use:       Review of Systems  Constitutional: Negative for fever and chills.  Respiratory: Negative for cough and shortness of breath.   Cardiovascular: Negative for chest pain.  Gastrointestinal: Negative for nausea, vomiting and abdominal pain.  Neurological: Positive for dizziness and light-headedness. Negative for weakness, numbness and headaches.    Allergies  Review of patient's allergies indicates no known allergies.  Home Medications   Current Outpatient Rx  Name Route Sig Dispense Refill  . AMITRIPTYLINE HCL 25 MG PO TABS Oral Take 25 mg by mouth at bedtime.      Marland Kitchen CLONIDINE HCL 0.2 MG PO TABS Oral Take 0.2 mg by mouth 2 (two) times daily.    . DUTASTERIDE-TAMSULOSIN HCL 0.5-0.4 MG PO CAPS Oral Take 1 tablet by mouth daily.    . FENOFIBRATE 160 MG PO TABS Oral Take 160 mg by mouth daily.      Marland Kitchen LACOSAMIDE 100 MG PO TABS Oral Take 100 mg by mouth 2 (two) times daily.    Marland Kitchen LORAZEPAM 1 MG PO TABS Oral Take 1 mg by mouth 3 (three) times daily.    . ABC PLUS SENIOR PO TABS Oral Take 1 tablet by mouth daily.      Marland Kitchen PANTOPRAZOLE SODIUM 40 MG PO TBEC Oral Take 40 mg by mouth 2 (two) times daily.    Marland Kitchen  PRAZOSIN HCL 2 MG PO CAPS Oral Take 2 mg by mouth daily.      BP 156/79  Pulse 85  Temp 97.7 F (36.5 C) (Oral)  Resp 18  SpO2 99%  Physical Exam  Nursing note and vitals reviewed. Constitutional: He is oriented to person, place, and time. He appears well-developed and well-nourished. No distress.  HENT:  Head: Normocephalic and atraumatic.  Mouth/Throat: Oropharynx is clear and moist.  Eyes: EOM are normal. Pupils are equal, round, and reactive to light.  Neck: Normal range of motion. Neck supple.  Cardiovascular: Normal rate and regular rhythm.   Pulmonary/Chest: Effort normal and breath sounds normal. No respiratory distress. He has no wheezes. He has no rales.  Abdominal: Soft. Bowel sounds are normal. There is no tenderness. There is no rebound and no guarding.  Musculoskeletal: Normal range of motion. He exhibits no edema and no tenderness.  Neurological: He is alert and oriented to person, place, and time.       Pt able to move both feet but has chronic limited strength due to his CP with no changes  Skin: Skin is warm and dry. No rash noted. No erythema.  Psychiatric: He has a normal mood and affect. His behavior is normal.  ED Course  Procedures (including critical care time)  Labs Reviewed  URINALYSIS, ROUTINE W REFLEX MICROSCOPIC - Abnormal; Notable for the following:    Specific Gravity, Urine 1.004 (*)     All other components within normal limits  POCT I-STAT, CHEM 8 - Abnormal; Notable for the following:    Glucose, Bld 102 (*)     Hemoglobin 12.9 (*)     HCT 38.0 (*)     All other components within normal limits  POCT I-STAT TROPONIN I   Dg Chest Port 1 View  03/27/2012  *RADIOLOGY REPORT*  Clinical Data: Dizziness  PORTABLE CHEST - 1 VIEW  Comparison: 05/27/2011  Findings: Degraded by rotation and hypoaeration.  Elevated hemidiaphragms obscure the lung bases.  Cardiomegaly.  Central vascular congestion.  Visualized lung bases demonstrate mild opacities.   No pneumothorax.  Small effusions not excluded.  No acute osseous change.  IMPRESSION: Degraded by positioning and hypoaeration.  Mild bibasilar opacities are nonspecific, may be chronic however difficult to exclude superimposed infiltrate in the appropriate clinical setting.  Original Report Authenticated By: Waneta Martins, M.D.     1. Postural lightheadedness      Date: 03/27/2012  Rate: 86  Rhythm: normal sinus rhythm  QRS Axis: normal  Intervals: prolonged QRS  ST/T Wave abnormalities: normal  Conduction Disutrbances:nonspecific intraventricular conduction delay  Narrative Interpretation:   Old EKG Reviewed: unchanged    MDM   Likely symptoms due to orthostasis. Will r/o other causes with plans to return to senior living facility.  Remains asymptomatic in ED. Will D/C home       Loren Racer, MD 03/27/12 1333

## 2012-03-27 NOTE — ED Notes (Signed)
Patient C/O dizziness that began this AM. States that dizziness has gone away. Voices no further complaints.   States that he is hungry and wants a hamburger and fries.

## 2012-04-16 ENCOUNTER — Emergency Department (HOSPITAL_COMMUNITY)
Admission: EM | Admit: 2012-04-16 | Discharge: 2012-04-16 | Disposition: A | Discharge status: Home / Self Care | Payer: Medicare Other | Attending: Emergency Medicine | Admitting: Emergency Medicine

## 2012-04-16 ENCOUNTER — Encounter (HOSPITAL_COMMUNITY): Payer: Self-pay

## 2012-04-16 ENCOUNTER — Emergency Department (HOSPITAL_COMMUNITY): Payer: Medicare Other

## 2012-04-16 DIAGNOSIS — G809 Cerebral palsy, unspecified: Secondary | ICD-10-CM | POA: Insufficient documentation

## 2012-04-16 DIAGNOSIS — S0990XA Unspecified injury of head, initial encounter: Secondary | ICD-10-CM | POA: Insufficient documentation

## 2012-04-16 DIAGNOSIS — Z79899 Other long term (current) drug therapy: Secondary | ICD-10-CM | POA: Insufficient documentation

## 2012-04-16 DIAGNOSIS — W050XXA Fall from non-moving wheelchair, initial encounter: Secondary | ICD-10-CM | POA: Insufficient documentation

## 2012-04-16 DIAGNOSIS — S46909A Unspecified injury of unspecified muscle, fascia and tendon at shoulder and upper arm level, unspecified arm, initial encounter: Secondary | ICD-10-CM | POA: Insufficient documentation

## 2012-04-16 DIAGNOSIS — Y92009 Unspecified place in unspecified non-institutional (private) residence as the place of occurrence of the external cause: Secondary | ICD-10-CM | POA: Insufficient documentation

## 2012-04-16 DIAGNOSIS — IMO0002 Reserved for concepts with insufficient information to code with codable children: Secondary | ICD-10-CM | POA: Insufficient documentation

## 2012-04-16 DIAGNOSIS — S4980XA Other specified injuries of shoulder and upper arm, unspecified arm, initial encounter: Secondary | ICD-10-CM | POA: Insufficient documentation

## 2012-04-16 DIAGNOSIS — G40909 Epilepsy, unspecified, not intractable, without status epilepticus: Secondary | ICD-10-CM | POA: Insufficient documentation

## 2012-04-16 NOTE — ED Notes (Signed)
Pt presents with report of falling while attempting to go into bathroom to take shower.  Pt reports falling, hitting head on dresser, denies any LOC.

## 2012-04-16 NOTE — ED Notes (Signed)
Patient transported to CT on stretcher with tech. 

## 2012-04-16 NOTE — ED Provider Notes (Signed)
History     CSN: [id number]  Arrival date & time 04/16/12  0801   First MD Initiated Contact with Patient 04/16/12 [phone]      Chief Complaint  Patient presents with  . Fall    (Consider location/radiation/quality/duration/timing/severity/associated sxs/prior treatment) Patient is a 61 y.o. male presenting with fall. The history is provided by the patient.  Fall The accident occurred less than 1 hour ago. Incident: transferring from wheelchair to shower. He fell from a height of 1 to 2 ft. He landed on a hard floor. There was no blood loss. The point of impact was the head and right shoulder. The patient is experiencing no pain. He was not ambulatory at the scene (not ambulatory at baseline). Pertinent negatives include no visual change, no numbness, no abdominal pain, no nausea, no vomiting, no headaches and no loss of consciousness. He has tried nothing for the symptoms.    Past Medical History  Diagnosis Date  . Seizures   . Cerebral palsy     History reviewed. No pertinent past surgical history.  No family history on file.  History  Substance Use Topics  . Smoking status: Not on file  . Smokeless tobacco: Not on file  . Alcohol Use:       Review of Systems  HENT: Negative for neck pain.   Eyes: Negative for visual disturbance.  Cardiovascular: Negative for chest pain.  Gastrointestinal: Negative for nausea, vomiting and abdominal pain.  Genitourinary: Negative for dysuria.  Musculoskeletal: Negative for back pain.  Neurological: Negative for dizziness, seizures, loss of consciousness, syncope, weakness, light-headedness, numbness and headaches.  Psychiatric/Behavioral: Negative for confusion.  All other systems reviewed and are negative.    Allergies  Review of patient's allergies indicates no known allergies.  Home Medications   Current Outpatient Rx  Name Route Sig Dispense Refill  . AMITRIPTYLINE HCL 25 MG PO TABS Oral Take 25 mg by mouth at bedtime.       Marland Kitchen CLONIDINE HCL 0.2 MG PO TABS Oral Take 0.2 mg by mouth 2 (two) times daily.    . DUTASTERIDE-TAMSULOSIN HCL 0.5-0.4 MG PO CAPS Oral Take 1 tablet by mouth daily.    . FENOFIBRATE 160 MG PO TABS Oral Take 160 mg by mouth daily.      Marland Kitchen LACOSAMIDE 100 MG PO TABS Oral Take 100 mg by mouth 2 (two) times daily.    Marland Kitchen LORAZEPAM 1 MG PO TABS Oral Take 1 mg by mouth 3 (three) times daily.    . ABC PLUS SENIOR PO TABS Oral Take 1 tablet by mouth daily.      Marland Kitchen PANTOPRAZOLE SODIUM 40 MG PO TBEC Oral Take 40 mg by mouth 2 (two) times daily.    Marland Kitchen PRAZOSIN HCL 2 MG PO CAPS Oral Take 2 mg by mouth daily.      BP 159/81  Pulse 100  Resp 18  SpO2 98%  Physical Exam  Nursing note and vitals reviewed. Constitutional: He appears well-developed and well-nourished.  HENT:  Head: Normocephalic. Head is with abrasion. Head is without contusion.    Eyes: EOM are normal. Pupils are equal, round, and reactive to light.  Neck: Full passive range of motion without pain. No spinous process tenderness and no muscular tenderness present.  Cardiovascular: Normal rate and regular rhythm.   Pulmonary/Chest: Effort normal and breath sounds normal. No respiratory distress.  Abdominal: Soft. There is no tenderness.  Musculoskeletal: Normal range of motion. He exhibits no tenderness.  Contractures of BLE, L leg worse than R  Neurological: He is alert. No cranial nerve deficit or sensory deficit. GCS eye subscore is 4. GCS verbal subscore is 5. GCS motor subscore is 6.       Contractures of BLE. Unsure of date; per previous notes is not unusual. Decreased strength BLE and limited movement, but at baseline due to CP; normal movement and strength of BUE.  Skin: Skin is warm and dry. Abrasion noted.     Psychiatric: He has a normal mood and affect.    ED Course  Procedures (including critical care time)  Labs Reviewed - No data to display Ct Head Wo Contrast  04/16/2012  *RADIOLOGY REPORT*  Clinical Data:  Fall.  CT HEAD WITHOUT CONTRAST  Technique:  Contiguous axial images were obtained from the base of the skull through the vertex without contrast.  Comparison: CT 01/29/2012  Findings: Mild atrophy.  Prominent mineralization in the basal ganglia bilaterally is stable.  Mild mineralization in the frontal lobes, stable. Dense 4 mm calcification in the right pons is unchanged.  Negative for hemorrhage or mass.  Mild chronic microvascular ischemia in the white matter.  No subdural hematoma. Negative for skull fracture.  IMPRESSION: No acute intracranial abnormality.  Multiple areas of mineralization of the brain are stable.  Question metabolic disorder such as hyperparathyroidism.  Original Report Authenticated By: Camelia Phenes, M.D.     1. Fall at home       MDM  This is a 61 year old male with a history of CP presents today after a fall at home. He states he was trying to transfer himself from his wheelchair to the shower when he lost his balance and fell to the ground, hitting his head against the dresser. He denies any loss of consciousness and does not have any pain or any other symptoms at this point in time. His current complaint is that he is hungry as he does not eat breakfast, and that he still wants a shower; he keeps asking for something to eat. The patient is at his baseline mild confusion, has contractures of bilateral lower extremity extremities with decreased movement, and abrasion to the back of his forehead and his right shoulder but exam is otherwise unremarkable. Due to his age and baseline confusion, we'll check a CT of the head to evaluate for underlying injuries.  She had unremarkable patient has read unchanged discussed with patient treatment at home, and indications for return. Patient states understanding.       Theotis Burrow, MD 04/16/12 [phone]

## 2012-04-16 NOTE — ED Notes (Signed)
Pt returned from CT on stretcher by tech and tolerated well.  Pt requesting a sandwich, pt was instructed again to await imaging results.

## 2012-04-16 NOTE — ED Notes (Signed)
Pt requesting to eat a pimento cheese sandwich.  Pt instructed to await imaging results for food.

## 2012-04-16 NOTE — ED Provider Notes (Signed)
Medical screening examination/treatment/procedure(s) were conducted as a shared visit with non-physician practitioner(s) and myself.  I personally evaluated the patient during the encounter   Colin Kaplan, MD 04/16/12 1950

## 2012-04-17 ENCOUNTER — Emergency Department (HOSPITAL_COMMUNITY)
Admission: EM | Admit: 2012-04-17 | Discharge: 2012-04-17 | Disposition: A | Discharge status: Home / Self Care | Payer: Medicare Other | Attending: Emergency Medicine | Admitting: Emergency Medicine

## 2012-04-17 ENCOUNTER — Encounter (HOSPITAL_COMMUNITY): Payer: Self-pay | Admitting: Emergency Medicine

## 2012-04-17 DIAGNOSIS — R32 Unspecified urinary incontinence: Secondary | ICD-10-CM | POA: Insufficient documentation

## 2012-04-17 DIAGNOSIS — I10 Essential (primary) hypertension: Secondary | ICD-10-CM | POA: Insufficient documentation

## 2012-04-17 DIAGNOSIS — Z79899 Other long term (current) drug therapy: Secondary | ICD-10-CM | POA: Insufficient documentation

## 2012-04-17 DIAGNOSIS — G809 Cerebral palsy, unspecified: Secondary | ICD-10-CM | POA: Insufficient documentation

## 2012-04-17 DIAGNOSIS — E876 Hypokalemia: Secondary | ICD-10-CM | POA: Insufficient documentation

## 2012-04-17 DIAGNOSIS — Z9181 History of falling: Secondary | ICD-10-CM | POA: Insufficient documentation

## 2012-04-17 DIAGNOSIS — F79 Unspecified intellectual disabilities: Secondary | ICD-10-CM | POA: Insufficient documentation

## 2012-04-17 DIAGNOSIS — R4182 Altered mental status, unspecified: Secondary | ICD-10-CM | POA: Insufficient documentation

## 2012-04-17 HISTORY — DX: Unspecified intellectual disabilities: F79

## 2012-04-17 HISTORY — DX: Essential (primary) hypertension: I10

## 2012-04-17 LAB — URINALYSIS, ROUTINE W REFLEX MICROSCOPIC
Glucose, UA: NEGATIVE mg/dL
Ketones, ur: NEGATIVE mg/dL
Leukocytes, UA: NEGATIVE
Nitrite: NEGATIVE
Protein, ur: NEGATIVE mg/dL
Urobilinogen, UA: 0.2 mg/dL (ref 0.0–1.0)

## 2012-04-17 LAB — CBC WITH DIFFERENTIAL/PLATELET
Eosinophils Absolute: 0.2 10*3/uL (ref 0.0–0.7)
Eosinophils Relative: 3 % (ref 0–5)
HCT: 36.8 % — ABNORMAL LOW (ref 39.0–52.0)
Hemoglobin: 13 g/dL (ref 13.0–17.0)
Lymphocytes Relative: 29 % (ref 12–46)
Lymphs Abs: 1.4 10*3/uL (ref 0.7–4.0)
MCH: 32.7 pg (ref 26.0–34.0)
MCV: 92.5 fL (ref 78.0–100.0)
Monocytes Absolute: 0.5 10*3/uL (ref 0.1–1.0)
Monocytes Relative: 10 % (ref 3–12)
Platelets: 279 10*3/uL (ref 150–400)
RBC: 3.98 MIL/uL — ABNORMAL LOW (ref 4.22–5.81)
WBC: 4.8 10*3/uL (ref 4.0–10.5)

## 2012-04-17 LAB — COMPREHENSIVE METABOLIC PANEL
ALT: 18 U/L (ref 0–53)
BUN: 17 mg/dL (ref 6–23)
CO2: 27 mEq/L (ref 19–32)
Calcium: 9.6 mg/dL (ref 8.4–10.5)
GFR calc Af Amer: >90 mL/min (ref >90)
GFR calc non Af Amer: 90 mL/min — ABNORMAL LOW (ref >90)
Glucose, Bld: 140 mg/dL — ABNORMAL HIGH (ref 70–99)
Sodium: 141 mEq/L (ref 135–145)

## 2012-04-17 MED ORDER — POTASSIUM CHLORIDE CRYS ER 20 MEQ PO TBCR
40.0000 meq | EXTENDED_RELEASE_TABLET | Freq: Once | ORAL | Status: AC
  Administered 2012-04-17: 40 meq via ORAL
  Filled 2012-04-17: qty 2

## 2012-04-17 NOTE — ED Provider Notes (Addendum)
History     CSN: [id number]  Arrival date & time 04/17/12  1238   First MD Initiated Contact with Patient 04/17/12 1332      Chief Complaint  Patient presents with  . Altered Mental Status  . Fall    yesterday  . Urinary Incontinence    (Consider location/radiation/quality/duration/timing/severity/associated sxs/prior treatment) Patient is a 61 y.o. male presenting with altered mental status and fall. The history is provided by the patient.  Altered Mental Status  Fall   patient here with declining health over the past 4-5 months with increased falls. Spoke to his physician and he saw him a week ago and workup at that time was negative. Seen at another facility yesterday and per the chart to have reviewed and negative head CT. According to the patient's caregiver, who is brought patient in the last 5 years, states that he has not had a change in his level of functioning for the past month. Patient was sent here for workup of possible delirium. No recent fevers, cough, vomiting, diarrhea. No trauma since yesterday after he fell.  Past Medical History  Diagnosis Date  . Seizures   . Cerebral palsy   . Hypertension   . Mental retardation     History reviewed. No pertinent past surgical history.  History reviewed. No pertinent family history.  History  Substance Use Topics  . Smoking status: Never Smoker   . Smokeless tobacco: Not on file  . Alcohol Use: No      Review of Systems  Psychiatric/Behavioral: Positive for altered mental status.  All other systems reviewed and are negative.    Allergies  Review of patient's allergies indicates no known allergies.  Home Medications   Current Outpatient Rx  Name Route Sig Dispense Refill  . AMITRIPTYLINE HCL 25 MG PO TABS Oral Take 25 mg by mouth at bedtime.      Marland Kitchen CLONIDINE HCL 0.2 MG PO TABS Oral Take 0.2 mg by mouth at bedtime.     . DUTASTERIDE-TAMSULOSIN HCL 0.5-0.4 MG PO CAPS Oral Take 1 tablet by mouth daily.     . FENOFIBRATE 160 MG PO TABS Oral Take 160 mg by mouth daily.      Marland Kitchen LACOSAMIDE 100 MG PO TABS Oral Take 100 mg by mouth 2 (two) times daily.    Marland Kitchen LORAZEPAM 1 MG PO TABS Oral Take 1 mg by mouth 3 (three) times daily.    . ABC PLUS SENIOR PO TABS Oral Take 1 tablet by mouth daily.      Marland Kitchen PANTOPRAZOLE SODIUM 40 MG PO TBEC Oral Take 40 mg by mouth 2 (two) times daily.    Marland Kitchen PRAZOSIN HCL 2 MG PO CAPS Oral Take 2 mg by mouth at bedtime.       BP 148/82  Pulse 105  Temp 97.6 F (36.4 C) (Oral)  Resp 18  SpO2 100%  Physical Exam  Nursing note and vitals reviewed. Constitutional: He is oriented to person, place, and time. He appears well-developed. He appears cachectic.  Non-toxic appearance. No distress.  HENT:  Head: Normocephalic and atraumatic.  Eyes: Conjunctivae, EOM and lids are normal. Pupils are equal, round, and reactive to light.  Neck: Normal range of motion. Neck supple. No tracheal deviation present. No mass present.  Cardiovascular: Regular rhythm and normal heart sounds.  Tachycardia present.  Exam reveals no gallop.   No murmur heard. Pulmonary/Chest: Effort normal and breath sounds normal. No stridor. No respiratory distress. He has no decreased breath  sounds. He has no wheezes. He has no rhonchi. He has no rales.  Abdominal: Soft. Normal appearance and bowel sounds are normal. He exhibits no distension. There is no tenderness. There is no rebound and no CVA tenderness.  Musculoskeletal: Normal range of motion. He exhibits no edema and no tenderness.  Neurological: He is alert and oriented to person, place, and time. No cranial nerve deficit. He exhibits normal muscle tone. GCS eye subscore is 4. GCS verbal subscore is 5. GCS motor subscore is 6.  Skin: Skin is warm and dry. No abrasion and no rash noted.  Psychiatric: His speech is normal and behavior is normal. His affect is blunt.    ED Course  Procedures (including critical care time)   Labs Reviewed  URINALYSIS,  ROUTINE W REFLEX MICROSCOPIC  URINE CULTURE  CBC WITH DIFFERENTIAL  COMPREHENSIVE METABOLIC PANEL   Ct Head Wo Contrast  04/16/2012  *RADIOLOGY REPORT*  Clinical Data: Fall.  CT HEAD WITHOUT CONTRAST  Technique:  Contiguous axial images were obtained from the base of the skull through the vertex without contrast.  Comparison: CT 01/29/2012  Findings: Mild atrophy.  Prominent mineralization in the basal ganglia bilaterally is stable.  Mild mineralization in the frontal lobes, stable. Dense 4 mm calcification in the right pons is unchanged.  Negative for hemorrhage or mass.  Mild chronic microvascular ischemia in the white matter.  No subdural hematoma. Negative for skull fracture.  IMPRESSION: No acute intracranial abnormality.  Multiple areas of mineralization of the brain are stable.  Question metabolic disorder such as hyperparathyroidism.  Original Report Authenticated By: Camelia Phenes, M.D.     No diagnosis found.    MDM  Spoke with pts pcp who has been his MD for 15 years and saw the pt last week ans states that his current condition is chronic--will check labs and likely d/c pt is neg  3:05 PM Patient's potassium treated. Spoke with his primary care Dr., Dr. Evlyn Kanner, he will see him in followup   Date: 05/12/2012  Rate: 89  Rhythm: normal sinus rhythm  QRS Axis: normal  Intervals: normal  ST/T Wave abnormalities: normal  Conduction Disutrbances:right bundle branch block and   Narrative Interpretation:   Old EKG Reviewed: unchanged        Toy Baker, MD 04/17/12 1505  Toy Baker, MD 04/17/12 7564  Toy Baker, MD 05/12/12 [phone]

## 2012-04-17 NOTE — ED Notes (Signed)
Lives independently, with roommate, but has increased confusion and inability to care for self,.

## 2012-04-17 NOTE — ED Notes (Signed)
Caregiver states that pt has been having increased confusion and falls.  States they were seen [facility] for a [facility].  Caregiver states she was sent here for delerium workup.  Pt alert oriented x3, and conversive.

## 2012-04-17 NOTE — ED Notes (Addendum)
Pt has hx MR & CP who has been declining over several past months.  Has been falling, not taking meds and more confused.  Sent by his Environmental consultant (for [employer]) for a delirium workup.

## 2012-04-18 LAB — URINE CULTURE

## 2012-05-04 ENCOUNTER — Other Ambulatory Visit: Payer: Self-pay | Admitting: Internal Medicine

## 2012-05-16 ENCOUNTER — Encounter (HOSPITAL_COMMUNITY): Payer: Self-pay | Admitting: Emergency Medicine

## 2012-05-16 ENCOUNTER — Emergency Department (HOSPITAL_COMMUNITY)
Admission: EM | Admit: 2012-05-16 | Discharge: 2012-05-17 | Disposition: A | Discharge status: Home / Self Care | Payer: Medicare Other | Attending: Emergency Medicine | Admitting: Emergency Medicine

## 2012-05-16 DIAGNOSIS — G809 Cerebral palsy, unspecified: Secondary | ICD-10-CM | POA: Insufficient documentation

## 2012-05-16 DIAGNOSIS — F79 Unspecified intellectual disabilities: Secondary | ICD-10-CM | POA: Insufficient documentation

## 2012-05-16 DIAGNOSIS — R5383 Other fatigue: Secondary | ICD-10-CM | POA: Insufficient documentation

## 2012-05-16 DIAGNOSIS — R531 Weakness: Secondary | ICD-10-CM

## 2012-05-16 DIAGNOSIS — R42 Dizziness and giddiness: Secondary | ICD-10-CM | POA: Insufficient documentation

## 2012-05-16 DIAGNOSIS — I10 Essential (primary) hypertension: Secondary | ICD-10-CM | POA: Insufficient documentation

## 2012-05-16 DIAGNOSIS — Z79899 Other long term (current) drug therapy: Secondary | ICD-10-CM | POA: Insufficient documentation

## 2012-05-16 DIAGNOSIS — R5381 Other malaise: Secondary | ICD-10-CM | POA: Insufficient documentation

## 2012-05-16 LAB — COMPREHENSIVE METABOLIC PANEL
AST: 33 U/L (ref 0–37)
Albumin: 4 g/dL (ref 3.5–5.2)
Alkaline Phosphatase: 60 U/L (ref 39–117)
Chloride: 99 mEq/L (ref 96–112)
Potassium: 3.2 mEq/L — ABNORMAL LOW (ref 3.5–5.1)
Total Bilirubin: 0.5 mg/dL (ref 0.3–1.2)

## 2012-05-16 LAB — URINALYSIS, ROUTINE W REFLEX MICROSCOPIC
Bilirubin Urine: NEGATIVE
Glucose, UA: NEGATIVE mg/dL
Hgb urine dipstick: NEGATIVE
Ketones, ur: NEGATIVE mg/dL
Protein, ur: NEGATIVE mg/dL

## 2012-05-16 LAB — CBC WITH DIFFERENTIAL/PLATELET
Basophils Absolute: 0 10*3/uL (ref 0.0–0.1)
Basophils Relative: 0 % (ref 0–1)
Hemoglobin: 13.1 g/dL (ref 13.0–17.0)
Lymphocytes Relative: 23 % (ref 12–46)
MCHC: 34.7 g/dL (ref 30.0–36.0)
Monocytes Relative: 8 % (ref 3–12)
Neutro Abs: 4.3 10*3/uL (ref 1.7–7.7)
Neutrophils Relative %: 67 % (ref 43–77)
RDW: 12.4 % (ref 11.5–15.5)
WBC: 6.4 10*3/uL (ref 4.0–10.5)

## 2012-05-16 NOTE — ED Notes (Signed)
Pt states he had a "funny feeling" around 6pm, unable to describe feeling, denies pain, pt states he took meds at appropriate time, he did eat today. Pt would like something more to eat but will wait until he sees MD. Pt alert, very preoccupied with TV, computers, other staff.

## 2012-05-16 NOTE — ED Notes (Signed)
Pt called EMS from home for "feeling funny", no complaints, alert.

## 2012-05-16 NOTE — ED Provider Notes (Signed)
History     CSN: [id number]  Arrival date & time 05/16/12  1950   First MD Initiated Contact with Patient 05/16/12 2119      Chief Complaint  Patient presents with  . general illness     (Consider location/radiation/quality/duration/timing/severity/associated sxs/prior treatment) HPI Comments: YOSHUA GEISINGER is a 61 y.o. Male history of cerebral palsy as well as mental retardation and hypertension here presenting with weakness. He noted that he took his night medicines tonight and then had a "funny feeling". He felt weak and dizzy but didn't pass out. No chest pain or shortness of breath or abdominal pain. No fevers. He had a recent head CT a month that was normal and denies head injury.    The history is provided by the patient. The history is limited by the condition of the patient and a developmental delay.    Past Medical History  Diagnosis Date  . Seizures   . Cerebral palsy   . Hypertension   . Mental retardation     History reviewed. No pertinent past surgical history.  No family history on file.  History  Substance Use Topics  . Smoking status: Never Smoker   . Smokeless tobacco: Not on file  . Alcohol Use: No      Review of Systems  Neurological: Positive for weakness.  All other systems reviewed and are negative.    Allergies  Review of patient's allergies indicates no known allergies.  Home Medications   Current Outpatient Rx  Name Route Sig Dispense Refill  . AMITRIPTYLINE HCL 25 MG PO TABS Oral Take 25 mg by mouth at bedtime.     Marland Kitchen CLONIDINE HCL 0.2 MG PO TABS Oral Take 0.2 mg by mouth at bedtime.     . DUTASTERIDE-TAMSULOSIN HCL 0.5-0.4 MG PO CAPS Oral Take 1 tablet by mouth daily.    . FENOFIBRATE 160 MG PO TABS Oral Take 160 mg by mouth daily.     Marland Kitchen LACOSAMIDE 100 MG PO TABS Oral Take 100 mg by mouth 2 (two) times daily.    Marland Kitchen LORAZEPAM 1 MG PO TABS Oral Take 1 mg by mouth 3 (three) times daily.    . ABC PLUS SENIOR PO TABS Oral Take 1  tablet by mouth daily.     Marland Kitchen PANTOPRAZOLE SODIUM 40 MG PO TBEC  TAKE ONE TABLET TWICE DAILY 60 tablet 2    NEED OFFICE VISIT FOR FURTHER REFILLS  . PRAZOSIN HCL 2 MG PO CAPS Oral Take 2 mg by mouth at bedtime.       BP 171/94  Pulse 91  Temp 97.9 F (36.6 C) (Oral)  Resp 18  SpO2 100%  Physical Exam  Constitutional:       Thin, emaciated. Slightly confused.   HENT:  Head: Normocephalic.  Eyes: Conjunctivae are normal. Pupils are equal, round, and reactive to light.  Neck: Normal range of motion. Neck supple.  Cardiovascular: Normal rate, regular rhythm and normal heart sounds.   Pulmonary/Chest: Effort normal and breath sounds normal.  Abdominal: Soft. Bowel sounds are normal.  Musculoskeletal: Normal range of motion.  Neurological: He is alert.  Skin: Skin is warm.  Psychiatric: He has a normal mood and affect. His behavior is normal. Thought content normal.       Poor judgement     ED Course  Procedures (including critical care time)  Labs Reviewed  CBC WITH DIFFERENTIAL - Abnormal; Notable for the following:    RBC 4.13 (*)  HCT 37.8 (*)     All other components within normal limits  COMPREHENSIVE METABOLIC PANEL - Abnormal; Notable for the following:    Potassium 3.2 (*)     Glucose, Bld 106 (*)     GFR calc non Af Amer 68 (*)     GFR calc Af Amer 78 (*)     All other components within normal limits  URINALYSIS, ROUTINE W REFLEX MICROSCOPIC - Abnormal; Notable for the following:    APPearance CLOUDY (*)     All other components within normal limits   No results found.   No diagnosis found.   Date: 05/16/2012  Rate: 107  Rhythm: sinus tachycardia  QRS Axis: normal  Intervals: normal  ST/T Wave abnormalities: normal  Conduction Disutrbances:none  Narrative Interpretation:   Old EKG Reviewed: unchanged RBBB, unchanged.    MDM  JAMEL HOLZMANN is a 61 y.o. male hx of cerebral palsy, HTN, mental retardation here with lightheadedness. Will get basic  labs, ekg, ua and reassess.   11:26 PM Labs nl, EKG unchanged. Slightly tachy but patient feels well. No SOB or CP, low risk for PE.       Richardean Canal, MD 05/16/12 [id number]

## 2012-05-16 NOTE — ED Notes (Signed)
[id number]<AV> Expected date:<BR> Expected time:<BR> Means of arrival:Ambulance<BR> Comments:<BR> EMS/feeling sick

## 2012-05-17 ENCOUNTER — Encounter (HOSPITAL_COMMUNITY): Payer: Self-pay | Admitting: *Deleted

## 2012-05-17 ENCOUNTER — Emergency Department (HOSPITAL_COMMUNITY)
Admission: EM | Admit: 2012-05-17 | Discharge: 2012-05-18 | Disposition: A | Discharge status: Home / Self Care | Payer: Medicare Other | Attending: Emergency Medicine | Admitting: Emergency Medicine

## 2012-05-17 DIAGNOSIS — G809 Cerebral palsy, unspecified: Secondary | ICD-10-CM | POA: Insufficient documentation

## 2012-05-17 DIAGNOSIS — Z8659 Personal history of other mental and behavioral disorders: Secondary | ICD-10-CM

## 2012-05-17 DIAGNOSIS — I10 Essential (primary) hypertension: Secondary | ICD-10-CM | POA: Insufficient documentation

## 2012-05-17 DIAGNOSIS — F79 Unspecified intellectual disabilities: Secondary | ICD-10-CM | POA: Insufficient documentation

## 2012-05-17 DIAGNOSIS — R5381 Other malaise: Secondary | ICD-10-CM | POA: Insufficient documentation

## 2012-05-17 DIAGNOSIS — G40909 Epilepsy, unspecified, not intractable, without status epilepticus: Secondary | ICD-10-CM | POA: Insufficient documentation

## 2012-05-17 LAB — URINALYSIS, ROUTINE W REFLEX MICROSCOPIC
Bilirubin Urine: NEGATIVE
Glucose, UA: NEGATIVE mg/dL
Ketones, ur: NEGATIVE mg/dL
Leukocytes, UA: NEGATIVE
pH: 6 (ref 5.0–8.0)

## 2012-05-17 NOTE — ED Notes (Signed)
[facility] called for transport.  

## 2012-05-17 NOTE — ED Provider Notes (Signed)
History     CSN: [id number]  Arrival date & time 05/17/12  2022   First MD Initiated Contact with Patient 05/17/12 2150      Chief Complaint  Patient presents with  . Fatigue    (Consider location/radiation/quality/duration/timing/severity/associated sxs/prior treatment) The history is provided by the patient and medical records.    Colin Bradley is a 61 y.o. male presents to the emergency department without complaint.  EMS states they were called to the Residence by [employer] because [employer] was unable to figure out what was going on with the patient. At the residence he stated he wanted to go to the hospital.  Pt states he called 911 because there was an animal in the house, but the police took care of the problem.  He denies further sight of animals, hearing voices.  He answers all other questions appropriately.  He has a Hx of cerebral palsy, mental retardation and hypertension.  He was evaluated by Dr Silverio Lay for weakness yesterday without significant findings. Tonight the patient is without complaint on my interview.  He asked the nurse for a sleeping Rx, but when I asked him about his difficulty sleeping, he states he sleeps just fine if all the lights are turned out.  He is simply asking for a Coke.     The patient has medical history significant for:  Past Medical History  Diagnosis Date  . Seizures   . Cerebral palsy   . Hypertension   . Mental retardation      Past Medical History  Diagnosis Date  . Seizures   . Cerebral palsy   . Hypertension   . Mental retardation     History reviewed. No pertinent past surgical history.  History reviewed. No pertinent family history.  History  Substance Use Topics  . Smoking status: Never Smoker   . Smokeless tobacco: Not on file  . Alcohol Use: No      Review of Systems  Constitutional: Negative for fever, diaphoresis, appetite change, fatigue and unexpected weight change.  HENT: Negative for mouth sores.   Eyes: Negative  for visual disturbance.  Respiratory: Negative for cough, chest tightness, shortness of breath and wheezing.   Cardiovascular: Negative for chest pain.  Gastrointestinal: Negative for nausea, vomiting, abdominal pain, diarrhea and constipation.  Genitourinary: Negative for dysuria, urgency, frequency and hematuria.  Musculoskeletal: Negative for back pain.  Skin: Negative for rash.  Neurological: Negative for syncope, light-headedness and headaches.  Hematological: Does not bruise/bleed easily.  Psychiatric/Behavioral: Negative for disturbed wake/sleep cycle. The patient is not nervous/anxious.     Allergies  Review of patient's allergies indicates no known allergies.  Home Medications   Current Outpatient Rx  Name Route Sig Dispense Refill  . AMITRIPTYLINE HCL 25 MG PO TABS Oral Take 25 mg by mouth at bedtime.     Marland Kitchen CLONIDINE HCL 0.2 MG PO TABS Oral Take 0.2 mg by mouth at bedtime.     . DUTASTERIDE-TAMSULOSIN HCL 0.5-0.4 MG PO CAPS Oral Take 1 tablet by mouth daily.    . FENOFIBRATE 160 MG PO TABS Oral Take 160 mg by mouth daily.     Marland Kitchen LACOSAMIDE 100 MG PO TABS Oral Take 100 mg by mouth 2 (two) times daily.    Marland Kitchen LORAZEPAM 1 MG PO TABS Oral Take 1 mg by mouth 3 (three) times daily.    . ABC PLUS SENIOR PO TABS Oral Take 1 tablet by mouth daily.     Marland Kitchen PANTOPRAZOLE SODIUM 40 MG  PO TBEC  TAKE ONE TABLET TWICE DAILY 60 tablet 2    NEED OFFICE VISIT FOR FURTHER REFILLS  . PRAZOSIN HCL 2 MG PO CAPS Oral Take 2 mg by mouth at bedtime.       BP 159/94  Pulse 92  Temp 98 F (36.7 C) (Oral)  Resp 24  SpO2 99%  Physical Exam  Nursing note and vitals reviewed. Constitutional: He appears well-developed and well-nourished. No distress.  HENT:  Head: Normocephalic and atraumatic.  Mouth/Throat: Oropharynx is clear and moist. No oropharyngeal exudate.  Eyes: Conjunctivae are normal. No scleral icterus.  Neck: Normal range of motion. Neck supple.  Cardiovascular: Normal rate, regular  rhythm and intact distal pulses.   Pulmonary/Chest: Effort normal and breath sounds normal. No respiratory distress. He has no wheezes.  Abdominal: Soft. Bowel sounds are normal. He exhibits no mass. There is no tenderness. There is no rebound and no guarding.  Musculoskeletal: Normal range of motion. He exhibits no edema.  Neurological: He is alert.       Speech is clear and goal oriented Moves extremities without ataxia  Skin: Skin is warm and dry. He is not diaphoretic.  Psychiatric: He has a normal mood and affect.    ED Course  Procedures (including critical care time)   Labs Reviewed  URINALYSIS, ROUTINE W REFLEX MICROSCOPIC   No results found. Results for orders placed during the hospital encounter of 05/17/12  URINALYSIS, ROUTINE W REFLEX MICROSCOPIC      Component Value Range   Color, Urine YELLOW  YELLOW   APPearance CLEAR  CLEAR   Specific Gravity, Urine 1.018  1.005 - 1.030   pH 6.0  5.0 - 8.0   Glucose, UA NEGATIVE  NEGATIVE mg/dL   Hgb urine dipstick NEGATIVE  NEGATIVE   Bilirubin Urine NEGATIVE  NEGATIVE   Ketones, ur NEGATIVE  NEGATIVE mg/dL   Protein, ur NEGATIVE  NEGATIVE mg/dL   Urobilinogen, UA 1.0  0.0 - 1.0 mg/dL   Nitrite NEGATIVE  NEGATIVE   Leukocytes, UA NEGATIVE  NEGATIVE   No results found.  1. Hx of mental retardation   2. Cerebral palsy     MDM  ARNOLD KESTER has a hx of CP and MR.  He is without complaint in the ED tonight. I will screen for a UTI, but UA was negative yesterday.  I do no suspect a medical or psychiatric problem.  His UA is negative. He states that he feels fine and wants to go home and go to bed.  I will discharge home.          Dahlia Client Dulce Martian, PA-C 05/17/12 2252

## 2012-05-17 NOTE — ED Notes (Signed)
Pt is hard to understand and has difficulty communicating.  His only complaint is that he is unable to sleep.  He is not in any acute distress nor is any injury noted.

## 2012-05-17 NOTE — ED Provider Notes (Signed)
Medical screening examination/treatment/procedure(s) were performed by non-physician practitioner and as supervising physician I was immediately available for consultation/collaboration.   David H Yao, MD 05/17/12 2356 

## 2012-05-17 NOTE — ED Notes (Signed)
Per EMS report, Pt from home: [employer] was there upon EMS arrival and pt states he "wants to go to the hospital."  There is a caregiver that comes in daily but has not been able by today.  Pt's friend said he has only ate a little bit.  Pt is MR, CP, HTN, seizures.  Able to follow commands but has difficulty to communicate.  VSS enroute: 118/78, HR:110, CBG: 111

## 2012-05-17 NOTE — ED Notes (Signed)
[facility] called for transport of pt back to residence

## 2012-05-20 ENCOUNTER — Emergency Department (HOSPITAL_COMMUNITY)
Admission: EM | Admit: 2012-05-20 | Discharge: 2012-05-20 | Disposition: A | Discharge status: Home / Self Care | Payer: Medicare Other | Source: Home / Self Care | Attending: Emergency Medicine | Admitting: Emergency Medicine

## 2012-05-20 ENCOUNTER — Emergency Department (HOSPITAL_COMMUNITY): Payer: Medicare Other

## 2012-05-20 ENCOUNTER — Encounter (HOSPITAL_COMMUNITY): Payer: Self-pay

## 2012-05-20 ENCOUNTER — Emergency Department (HOSPITAL_COMMUNITY)
Admission: EM | Admit: 2012-05-20 | Discharge: 2012-05-20 | Disposition: A | Discharge status: Home / Self Care | Payer: Medicare Other | Attending: Emergency Medicine | Admitting: Emergency Medicine

## 2012-05-20 ENCOUNTER — Encounter (HOSPITAL_COMMUNITY): Payer: Self-pay | Admitting: *Deleted

## 2012-05-20 DIAGNOSIS — G809 Cerebral palsy, unspecified: Secondary | ICD-10-CM | POA: Insufficient documentation

## 2012-05-20 DIAGNOSIS — R569 Unspecified convulsions: Secondary | ICD-10-CM | POA: Insufficient documentation

## 2012-05-20 DIAGNOSIS — W19XXXA Unspecified fall, initial encounter: Secondary | ICD-10-CM

## 2012-05-20 DIAGNOSIS — I10 Essential (primary) hypertension: Secondary | ICD-10-CM | POA: Insufficient documentation

## 2012-05-20 DIAGNOSIS — F79 Unspecified intellectual disabilities: Secondary | ICD-10-CM | POA: Insufficient documentation

## 2012-05-20 DIAGNOSIS — Z008 Encounter for other general examination: Secondary | ICD-10-CM | POA: Insufficient documentation

## 2012-05-20 LAB — URINALYSIS, ROUTINE W REFLEX MICROSCOPIC
Leukocytes, UA: NEGATIVE
Nitrite: NEGATIVE
Protein, ur: NEGATIVE mg/dL
Specific Gravity, Urine: 1.021 (ref 1.005–1.030)
Urobilinogen, UA: 0.2 mg/dL (ref 0.0–1.0)

## 2012-05-20 LAB — POCT I-STAT, CHEM 8
Creatinine, Ser: 1.2 mg/dL (ref 0.50–1.35)
HCT: 38 % — ABNORMAL LOW (ref 39.0–52.0)
Hemoglobin: 12.9 g/dL — ABNORMAL LOW (ref 13.0–17.0)
Potassium: 3.4 mEq/L — ABNORMAL LOW (ref 3.5–5.1)
Sodium: 140 mEq/L (ref 135–145)
TCO2: 21 mmol/L (ref 0–100)

## 2012-05-20 MED ORDER — SODIUM CHLORIDE 0.9 % IV SOLN
1000.0000 mg | Freq: Once | INTRAVENOUS | Status: AC
  Administered 2012-05-20: 1000 mg via INTRAVENOUS
  Filled 2012-05-20: qty 10

## 2012-05-20 MED ORDER — LEVETIRACETAM 500 MG PO TABS: 500.0000 mg | ORAL_TABLET | Freq: Two times a day (BID) | ORAL | Status: DC

## 2012-05-20 MED ORDER — LORAZEPAM 2 MG/ML IJ SOLN
1.0000 mg | Freq: Once | INTRAMUSCULAR | Status: AC
  Administered 2012-05-20: 1 mg via INTRAVENOUS

## 2012-05-20 MED ORDER — LORAZEPAM 2 MG/ML IJ SOLN
INTRAMUSCULAR | Status: AC
  Administered 2012-05-20: 1 mg via INTRAVENOUS
  Filled 2012-05-20: qty 1

## 2012-05-20 NOTE — ED Provider Notes (Signed)
I received sign-over at 1600.  Mr. Spark had experienced an additional seizure prior to being discharged home and was awaiting an IV Keppra load.  Almost 3 hours have passed without any further seizure activity.  He is currently stable, NAD.  Plan discharge home.  Tobin Chad, MD 05/20/12 [phone]

## 2012-05-20 NOTE — ED Notes (Signed)
Pt in by ems, ems reports [facility] was moving pt from gurney to [facility] and had a seizure. [facility] reports pt was post-ictal on scene. Pt reports he had a seizure when he got home, denies any complaints.

## 2012-05-20 NOTE — ED Provider Notes (Addendum)
History     CSN: [id number]  Arrival date & time 05/20/12  0818   First MD Initiated Contact with Patient 05/20/12 [id number]      Chief Complaint  Patient presents with  . Fall    (Consider location/radiation/quality/duration/timing/severity/associated sxs/prior treatment) Patient is a 61 y.o. male presenting with fall. The history is provided by the patient and the EMS personnel.  Fall Pertinent negatives include no numbness, no abdominal pain, no nausea, no vomiting and no headaches.   patient reportedly slid out of his wheelchair we'll try to get dressed. He was sent in by his home health nurse. He is no injury or pain or just wants to get checked out. No new numbness or weakness. No back pain. No dysuria. He states that he has had some shaking in his arms yesterday. He thinks that he may need to stay overnight to get watched.  Past Medical History  Diagnosis Date  . Seizures   . Cerebral palsy   . Hypertension   . Mental retardation     History reviewed. No pertinent past surgical history.  No family history on file.  History  Substance Use Topics  . Smoking status: Never Smoker   . Smokeless tobacco: Not on file  . Alcohol Use: No      Review of Systems  Constitutional: Negative for activity change and appetite change.  HENT: Negative for neck stiffness.   Eyes: Negative for pain.  Respiratory: Negative for chest tightness and shortness of breath.   Cardiovascular: Negative for chest pain and leg swelling.  Gastrointestinal: Negative for nausea, vomiting and abdominal pain.  Genitourinary: Negative for flank pain.  Musculoskeletal: Negative for back pain.  Skin: Negative for rash.  Neurological: Positive for tremors and weakness. Negative for numbness and headaches.    Allergies  Review of patient's allergies indicates no known allergies.  Home Medications   Current Outpatient Rx  Name Route Sig Dispense Refill  . AMITRIPTYLINE HCL 25 MG PO TABS Oral Take  25 mg by mouth at bedtime.     Marland Kitchen CLONIDINE HCL 0.2 MG PO TABS Oral Take 0.2 mg by mouth at bedtime.     . DUTASTERIDE-TAMSULOSIN HCL 0.5-0.4 MG PO CAPS Oral Take 1 tablet by mouth daily.    . FENOFIBRATE 160 MG PO TABS Oral Take 160 mg by mouth daily.     Marland Kitchen LACOSAMIDE 100 MG PO TABS Oral Take 100 mg by mouth 2 (two) times daily.    Marland Kitchen LORAZEPAM 1 MG PO TABS Oral Take 1 mg by mouth 3 (three) times daily.    . ABC PLUS SENIOR PO TABS Oral Take 1 tablet by mouth daily.     Marland Kitchen PANTOPRAZOLE SODIUM 40 MG PO TBEC  TAKE ONE TABLET TWICE DAILY 60 tablet 2    NEED OFFICE VISIT FOR FURTHER REFILLS  . PRAZOSIN HCL 2 MG PO CAPS Oral Take 2 mg by mouth at bedtime.       BP 160/81  Pulse 110  Temp 97.7 F (36.5 C)  Resp 20  SpO2 99%  Physical Exam  Constitutional: He appears well-developed.  HENT:  Head: Normocephalic.  Eyes: Pupils are equal, round, and reactive to light.  Neck: Normal range of motion. Neck supple.  Cardiovascular: Normal rate.   Pulmonary/Chest: Effort normal.  Abdominal: Soft. There is no tenderness.  Musculoskeletal:       No spinal tenderness. No pelvic tenderness. No extremity tenderness.  Neurological: He is alert.  Patient is awake and appears appropriate  Skin: Skin is warm.    ED Course  Procedures (including critical care time)  Labs Reviewed - No data to display No results found.   1. Fall       MDM  Patient was a fall out of his wheelchair. No apparent injury. Patient states that his arms were both shaking last night. I doubt this is a seizure. He was seen in the last few days it had a negative urinalysis at that time. I do not believe he needs to watch this time and will be discharged home.        Juliet Rude. Rubin Payor, MD 05/20/12 1022  Juliet Rude. Rubin Payor, MD 07/13/12 [phone]

## 2012-05-20 NOTE — Progress Notes (Addendum)
CSW was consulted by ED RN to assist with helping the pt transport home. According to RN, pt is ready for discharge however the pt does not have a key to get into his home, and his roommate was not answering the phone.   CSW met with pt to assess needs. Pt states that he and his roommate live at [employer] and that they both health concerns. Pt states that he was afraid his roommate was asleep and would not hear the phone or doorbell once he arrived home. Pt states that his roommate does not ambulate well and was unsure if he could come to the door. With pt's permission, CSW contacted pt's roommate, Leslee Home ([phone]) to see if he was able to assist. CSW was unable to reach [place] or the pt's father, Simeon Craft [phone]). CSW then contacted [employer] to see if anyone was available to unlock the pt's door. CSW spoke with Raiford Noble [phone]) with [facility] who states that the pt's roommate is always home. Rick added that he was unable to come to the home to unlock the door for the pt though did acknowledge that he had a key. CSW contacted pt's roommate once more and was able to speak with him. Per Jeannett Senior, he is able to unlock the door for the pt once he arrives with EMS.    CSW reviewed with the pt if he had any home health services that worked with him. Pt reported that at this time, he "lives independently and hopes to continue living that way". Pt states that he is willing to have home health services and chose [facility]. EDP notified and placed the orders for home health including a RN, SW, PT and OT. On call CM notified.   Water engineer notified. No further needs identified at this time.

## 2012-05-20 NOTE — ED Notes (Signed)
Nurse was walking by and noticed pt having [employer] Seizure. Dr. Rubin Payor witnessed/notified. 1mg  of Ativan given. Loading dose of Keppra to be given. Pt postictal currently. NAD.

## 2012-05-20 NOTE — ED Provider Notes (Addendum)
History     CSN: [id number]  Arrival date & time 05/20/12  1218   First MD Initiated Contact with Patient 05/20/12 1237      Chief Complaint  Patient presents with  . Seizures    (Consider location/radiation/quality/duration/timing/severity/associated sxs/prior treatment) The history is provided by the patient.   patient was a fall out of his wheelchair. Patient states both his arms were shaking. He did not lose consciousness. He does have a history of seizures. He is without complaints.  Past Medical History  Diagnosis Date  . Seizures   . Cerebral palsy   . Hypertension   . Mental retardation     History reviewed. No pertinent past surgical history.  No family history on file.  History  Substance Use Topics  . Smoking status: Never Smoker   . Smokeless tobacco: Not on file  . Alcohol Use: No      Review of Systems  Constitutional: Negative for appetite change.  Cardiovascular: Negative for chest pain.  Gastrointestinal: Negative for abdominal pain.  Genitourinary: Negative for flank pain.  Musculoskeletal: Negative for back pain.  Neurological: Positive for seizures.    Allergies  Review of patient's allergies indicates no known allergies.  Home Medications   Current Outpatient Rx  Name Route Sig Dispense Refill  . AMITRIPTYLINE HCL 25 MG PO TABS Oral Take 25 mg by mouth at bedtime.     Marland Kitchen CLONIDINE HCL 0.2 MG PO TABS Oral Take 0.2 mg by mouth at bedtime.     . DUTASTERIDE-TAMSULOSIN HCL 0.5-0.4 MG PO CAPS Oral Take 1 tablet by mouth daily.    . FENOFIBRATE 160 MG PO TABS Oral Take 160 mg by mouth daily.     Marland Kitchen LACOSAMIDE 100 MG PO TABS Oral Take 100 mg by mouth 2 (two) times daily.    Marland Kitchen LORAZEPAM 1 MG PO TABS Oral Take 1 mg by mouth 3 (three) times daily.    . ABC PLUS SENIOR PO TABS Oral Take 1 tablet by mouth daily.     Marland Kitchen PANTOPRAZOLE SODIUM 40 MG PO TBEC Oral Take 40 mg by mouth daily.    Marland Kitchen PRAZOSIN HCL 2 MG PO CAPS Oral Take 2 mg by mouth at  bedtime.     Marland Kitchen LEVETIRACETAM 500 MG PO TABS Oral Take 1 tablet (500 mg total) by mouth every 12 (twelve) hours. 20 tablet 0    BP 162/72  Pulse 91  Temp 97.8 F (36.6 C)  Resp 18  SpO2 97%  Physical Exam  Constitutional: He appears well-developed.  HENT:  Head: Normocephalic.  Cardiovascular: Normal rate.   Pulmonary/Chest: Effort normal.  Abdominal: Soft.  Neurological: He is alert.    ED Course  Procedures (including critical care time)  Labs Reviewed  URINALYSIS, ROUTINE W REFLEX MICROSCOPIC - Abnormal; Notable for the following:    APPearance CLOUDY (*)     Ketones, ur TRACE (*)     All other components within normal limits  POCT I-STAT, CHEM 8 - Abnormal; Notable for the following:    Potassium 3.4 (*)     Glucose, Bld 118 (*)     Hemoglobin 12.9 (*)     HCT 38.0 (*)     All other components within normal limits   Dg Chest 2 View  05/20/2012  *RADIOLOGY REPORT*  Clinical Data: Seizure, hypertension  CHEST - 2 VIEW  Comparison: 03/27/2012  Findings: Cardiomediastinal silhouette is stable.  No acute infiltrate or pleural effusion.  No pulmonary edema.  Bony thorax is stable.  IMPRESSION: No active disease.  Original Report Authenticated By: Natasha Mead, M.D.     1. Seizure       MDM  The patient returned back to the ER. He was seen earlier and stated that it had both arm shaking. This did not sound like a seizure. While patient is being transferred from the EMS stretcher back to his bed he had a reported tonic-clonic seizure. He was postictal after that. Patient was returned to the ER. Lab work is reassuring. After discussion with neurology patient be started on 500 mg of Keppra twice a day. He'll follow with his neurologist.        Juliet Rude. Rubin Payor, MD 05/20/12 1513  Patient had a tonic clonic seizure while waiting for D/c.  Will now load with IV keppra and monitor before discharge. Care turned over to Dr Lorenso Courier.  Juliet Rude. Rubin Payor, MD 05/20/12  1607  Juliet Rude. Rubin Payor, MD 07/17/12 [phone]

## 2012-05-20 NOTE — ED Notes (Signed)
Pt reports he got a shower this am was getting dressed and his WC slipped out from under him, fell to floor. Has no complaints of pain, just wants to be checked out. Then reports he vomited, unsure of when, possibly last night. Sts he feels fine now, again just wanted to be checked out.

## 2012-05-20 NOTE — ED Notes (Signed)
[facility] called for transport home. 

## 2012-05-20 NOTE — ED Notes (Signed)
Per EMS pt slid out of his wheel chair while trying to get dressed. Pt is from home has home health nurse. Pt denies injury or pain just wants to be checked out.

## 2012-05-20 NOTE — Progress Notes (Signed)
Pt [place] p[place] dr Jeannett [place] [facility] [place]

## 2012-05-20 NOTE — ED Notes (Signed)
[id number]<AV> Expected date:05/20/12<BR> Expected time:12:04 PM<BR> Means of arrival:Ambulance<BR> Comments:<BR> seizure

## 2012-05-22 NOTE — Care Management Note (Signed)
    Page 1 of 1   05/22/2012     1:16:06 PM   CARE MANAGEMENT NOTE 05/22/2012  Patient:  TAJON, MORING   Account Number:  [id number]  Date Initiated:  05/22/2012  Documentation initiated by:  Sharrie Rothman  Subjective/Objective Assessment:   Pt in ED with seizures and has CP. Pt lives with a friend that also has CP.     Action/Plan:   05/20/12-CM called to arrange [facility] RN, PT, OT, and SW. Roosvelt Harps, Rn of [facility] is aware of referral. Pts nurse Abby notified to fax orders and Face to Face to [phone]. No DME needed   Anticipated DC Date:  05/20/2012   Anticipated DC Plan:  HOME W HOME HEALTH SERVICES      DC Planning Services  CM consult      [facility] Choice  HOME HEALTH   Choice offered to / List presented to:  C-1 Patient        HH arranged  HH-1 RN  HH-2 PT  HH-3 OT  HH-6 SOCIAL WORKER      HH agency  [facility]   Status of service:  Completed, signed off Medicare Important Message given?   (If response is "NO", the following Medicare IM given date fields will be blank) Date Medicare IM given:   Date Additional Medicare IM given:    Discharge Disposition:  HOME W HOME HEALTH SERVICES  Per UR Regulation:    If discussed at Long Length of Stay Meetings, dates discussed:    Comments:

## 2012-05-30 ENCOUNTER — Encounter (HOSPITAL_COMMUNITY): Payer: Self-pay | Admitting: Emergency Medicine

## 2012-05-30 ENCOUNTER — Emergency Department (HOSPITAL_COMMUNITY)
Admission: EM | Admit: 2012-05-30 | Discharge: 2012-05-31 | Disposition: A | Discharge status: Home / Self Care | Payer: Medicare Other | Attending: Emergency Medicine | Admitting: Emergency Medicine

## 2012-05-30 DIAGNOSIS — W19XXXA Unspecified fall, initial encounter: Secondary | ICD-10-CM

## 2012-05-30 DIAGNOSIS — G809 Cerebral palsy, unspecified: Secondary | ICD-10-CM | POA: Insufficient documentation

## 2012-05-30 DIAGNOSIS — Z043 Encounter for examination and observation following other accident: Secondary | ICD-10-CM | POA: Insufficient documentation

## 2012-05-30 DIAGNOSIS — I10 Essential (primary) hypertension: Secondary | ICD-10-CM | POA: Insufficient documentation

## 2012-05-30 DIAGNOSIS — W050XXA Fall from non-moving wheelchair, initial encounter: Secondary | ICD-10-CM | POA: Insufficient documentation

## 2012-05-30 DIAGNOSIS — G40909 Epilepsy, unspecified, not intractable, without status epilepticus: Secondary | ICD-10-CM | POA: Insufficient documentation

## 2012-05-30 DIAGNOSIS — Z79899 Other long term (current) drug therapy: Secondary | ICD-10-CM | POA: Insufficient documentation

## 2012-05-30 DIAGNOSIS — F79 Unspecified intellectual disabilities: Secondary | ICD-10-CM | POA: Insufficient documentation

## 2012-05-30 LAB — POCT I-STAT, CHEM 8
Chloride: 102 mEq/L (ref 96–112)
Glucose, Bld: 100 mg/dL — ABNORMAL HIGH (ref 70–99)
HCT: 36 % — ABNORMAL LOW (ref 39.0–52.0)
Hemoglobin: 12.2 g/dL — ABNORMAL LOW (ref 13.0–17.0)
Potassium: 3.6 mEq/L (ref 3.5–5.1)
Sodium: 141 mEq/L (ref 135–145)

## 2012-05-30 NOTE — ED Provider Notes (Signed)
Medical screening examination/treatment/procedure(s) were performed by non-physician practitioner and as supervising physician I was immediately available for consultation/collaboration.   Tache Bobst R Deisha Stull, MD 05/30/12 2308 

## 2012-05-30 NOTE — ED Notes (Signed)
[id number]<IO> Expected date:<BR> Expected time:<BR> Means of arrival:<BR> Comments:<BR> Rm 7, Medic 212, 60 M, Fall from W/C

## 2012-05-30 NOTE — ED Notes (Signed)
Pt sts he slid from a wheel chair earlier tonight. Did not hit his head, no pain. NAD

## 2012-05-30 NOTE — ED Notes (Signed)
[facility] called to transport patient home 

## 2012-05-30 NOTE — ED Provider Notes (Signed)
History     CSN: [id number]  Arrival date & time 05/30/12  2053   First MD Initiated Contact with Patient 05/30/12 2107      Chief Complaint  Patient presents with  . Fall    (Consider location/radiation/quality/duration/timing/severity/associated sxs/prior treatment) HPI  Pertinent negatives include no numbness, no abdominal pain, no nausea, no vomiting and no headaches.  patient reportedly slid out of his wheelchair this evening. He admits that he accidentally took his Ativan earlier than he was supposed to, which was around 3 hours ago and it made him sleepy. He was sent in by his home health nurse. He is no injury or pain or just wants to get checked out. No new numbness or weakness. No back pain. No dysuria. He states that he has had some shaking in his arms yesterday. He thinks that he may need to stay overnight to get watched.   Past Medical History  Diagnosis Date  . Seizures   . Cerebral palsy   . Hypertension   . Mental retardation     History reviewed. No pertinent past surgical history.  No family history on file.  History  Substance Use Topics  . Smoking status: Never Smoker   . Smokeless tobacco: Not on file  . Alcohol Use: No      Review of Systems   HEENT: denies blurry vision or change in hearing, denies head injury PULMONARY: Denies difficulty breathing and SOB CARDIAC: denies chest pain or heart palpitations MUSCULOSKELETAL:  Denies muscle injury ABDOMEN AL: denies abdominal pain GU: denies loss of bowel or urinary control NEURO: denies numbness and tingling in extremities SKIN: no new rashes PSYCH: patient denies anxiety or depression. NECK: Pt denies having neck pain     Allergies  Review of patient's allergies indicates no known allergies.  Home Medications   Current Outpatient Rx  Name Route Sig Dispense Refill  . AMITRIPTYLINE HCL 25 MG PO TABS Oral Take 25 mg by mouth at bedtime.     Marland Kitchen CLONIDINE HCL 0.2 MG PO TABS Oral Take  0.2 mg by mouth at bedtime.     . DUTASTERIDE-TAMSULOSIN HCL 0.5-0.4 MG PO CAPS Oral Take 1 tablet by mouth daily.    . FENOFIBRATE 160 MG PO TABS Oral Take 160 mg by mouth daily.     Marland Kitchen LACOSAMIDE 100 MG PO TABS Oral Take 100 mg by mouth 2 (two) times daily.    Marland Kitchen LEVETIRACETAM 500 MG PO TABS Oral Take 1 tablet (500 mg total) by mouth every 12 (twelve) hours. 20 tablet 0  . LORAZEPAM 1 MG PO TABS Oral Take 1 mg by mouth 3 (three) times daily.    . ABC PLUS SENIOR PO TABS Oral Take 1 tablet by mouth daily.     Marland Kitchen PANTOPRAZOLE SODIUM 40 MG PO TBEC Oral Take 40 mg by mouth daily.    Marland Kitchen PRAZOSIN HCL 2 MG PO CAPS Oral Take 2 mg by mouth at bedtime.       BP 151/80  Pulse 84  Temp 97.6 F (36.4 C) (Oral)  Resp 15  SpO2 98%  Physical Exam  Nursing note and vitals reviewed. Constitutional: He appears well-developed and well-nourished. No distress.  HENT:  Head: Normocephalic and atraumatic.  Eyes: Pupils are equal, round, and reactive to light.  Neck: Trachea normal and normal range of motion. Neck supple.  Cardiovascular: Normal rate and regular rhythm.   Pulmonary/Chest: Effort normal.  Abdominal: Soft.  Musculoskeletal:  Pt has cerebral palsy and is wheel chair bound  Neurological: He is alert.  Skin: Skin is warm and dry.    ED Course  Procedures (including critical care time)  Labs Reviewed  POCT I-STAT, CHEM 8 - Abnormal; Notable for the following:    Glucose, Bld 100 (*)     Hemoglobin 12.2 (*)     HCT 36.0 (*)     All other components within normal limits   No results found.   1. Fall       MDM   Patient was a fall out of his wheelchair. No apparent injury. Patient states that his arms were both shaking last night. I doubt this is a seizure. He was seen in the last few days it had a negative urinalysis at that time. I do not believe he needs to watch this time and will be discharged home.   Pt has been advised of the symptoms that warrant their return to the  ED. Patient has voiced understanding and has agreed to follow-up with the PCP or specialist.        Dorthula Matas, PA 05/30/12 2251

## 2012-05-30 NOTE — ED Notes (Signed)
Patient given discharge instructions, information, prescriptions, and diet order. Patient states that they adequately understand discharge information given and to return to ED if symptoms return or worsen.    [facility] called to transport patient back.

## 2012-05-30 NOTE — ED Notes (Signed)
Pt alert, arrives from home, c/o fall from wheelchair, onset was this evening, hx of cerebral palsy, denies LOC, resp even unlabored, skin pwd, Denies c/o, requesting eval

## 2012-06-07 ENCOUNTER — Emergency Department (HOSPITAL_COMMUNITY): Payer: Medicare Other

## 2012-06-07 ENCOUNTER — Encounter (HOSPITAL_COMMUNITY): Payer: Self-pay | Admitting: Emergency Medicine

## 2012-06-07 ENCOUNTER — Emergency Department (HOSPITAL_COMMUNITY)
Admission: EM | Admit: 2012-06-07 | Discharge: 2012-06-10 | Disposition: A | Discharge status: Home / Self Care | Payer: Medicare Other | Source: Home / Self Care | Attending: Emergency Medicine | Admitting: Emergency Medicine

## 2012-06-07 DIAGNOSIS — S92309A Fracture of unspecified metatarsal bone(s), unspecified foot, initial encounter for closed fracture: Secondary | ICD-10-CM

## 2012-06-07 DIAGNOSIS — F79 Unspecified intellectual disabilities: Secondary | ICD-10-CM | POA: Insufficient documentation

## 2012-06-07 DIAGNOSIS — M25579 Pain in unspecified ankle and joints of unspecified foot: Secondary | ICD-10-CM | POA: Insufficient documentation

## 2012-06-07 DIAGNOSIS — G809 Cerebral palsy, unspecified: Secondary | ICD-10-CM | POA: Insufficient documentation

## 2012-06-07 DIAGNOSIS — R059 Cough, unspecified: Secondary | ICD-10-CM | POA: Insufficient documentation

## 2012-06-07 DIAGNOSIS — R05 Cough: Secondary | ICD-10-CM | POA: Insufficient documentation

## 2012-06-07 DIAGNOSIS — Z79899 Other long term (current) drug therapy: Secondary | ICD-10-CM | POA: Insufficient documentation

## 2012-06-07 DIAGNOSIS — G40909 Epilepsy, unspecified, not intractable, without status epilepticus: Secondary | ICD-10-CM | POA: Insufficient documentation

## 2012-06-07 DIAGNOSIS — W19XXXA Unspecified fall, initial encounter: Secondary | ICD-10-CM

## 2012-06-07 DIAGNOSIS — W050XXA Fall from non-moving wheelchair, initial encounter: Secondary | ICD-10-CM | POA: Insufficient documentation

## 2012-06-07 HISTORY — DX: Depression, unspecified: F32.A

## 2012-06-07 HISTORY — DX: Major depressive disorder, single episode, unspecified: F32.9

## 2012-06-07 NOTE — ED Notes (Signed)
PER EMS- pt picked up from home with c/o fall.  Pt has hx of cerebral palsy.  EMS had to force entry into house, upon opening the door pt was sitting on the floor next wheelchair, alert and oriented and no LOC.  No deformities noted. Denies head injury or neck and back pain.  Pt c/o r ankle pain, ankle splinted and ice applied by EMS.

## 2012-06-07 NOTE — ED Notes (Signed)
[id number]<WG> Expected date:<BR> Expected time:<BR> Means of arrival:<BR> Comments:<BR> M 211

## 2012-06-07 NOTE — ED Provider Notes (Signed)
History     CSN: [id number]  Arrival date & time 06/07/12  2007   First MD Initiated Contact with Patient 06/07/12 2137      Chief Complaint  Patient presents with  . Fall    (Consider location/radiation/quality/duration/timing/severity/associated sxs/prior treatment) HPI  Patient states that he fell out of his wheelchair today and thinks he hurt his right leg. He denies hitting his head or loss of consciousness. He denies pain in his back chest or upper extremities. He presents via EMS with a splint on his right lower leg.  PCP Dr. Evlyn Kanner  Past Medical History  Diagnosis Date  . Seizures   . Cerebral palsy   . Hypertension   . Mental retardation   . Depression     History reviewed. No pertinent past surgical history.  No family history on file.  History  Substance Use Topics  . Smoking status: Never Smoker   . Smokeless tobacco: Not on file  . Alcohol Use: No  Uses a wheelchair Lives alone   Review of Systems  All other systems reviewed and are negative.    Allergies  Review of patient's allergies indicates no known allergies.  Home Medications   Current Outpatient Rx  Name Route Sig Dispense Refill  . AMITRIPTYLINE HCL 25 MG PO TABS Oral Take 25 mg by mouth at bedtime.     Marland Kitchen CLONIDINE HCL 0.2 MG PO TABS Oral Take 0.2 mg by mouth at bedtime.     . DUTASTERIDE-TAMSULOSIN HCL 0.5-0.4 MG PO CAPS Oral Take 1 tablet by mouth daily.    . FENOFIBRATE 160 MG PO TABS Oral Take 160 mg by mouth daily.     Marland Kitchen LACOSAMIDE 100 MG PO TABS Oral Take 100 mg by mouth 2 (two) times daily.    Marland Kitchen LEVETIRACETAM 500 MG PO TABS Oral Take 1 tablet (500 mg total) by mouth every 12 (twelve) hours. 20 tablet 0  . LORAZEPAM 1 MG PO TABS Oral Take 1 mg by mouth 3 (three) times daily.    . ABC PLUS SENIOR PO TABS Oral Take 1 tablet by mouth daily.     Marland Kitchen PANTOPRAZOLE SODIUM 40 MG PO TBEC Oral Take 40 mg by mouth daily.    Marland Kitchen PRAZOSIN HCL 2 MG PO CAPS Oral Take 2 mg by mouth at bedtime.         BP 157/86  Pulse 100  Temp 97.6 F (36.4 C) (Oral)  Resp 18  SpO2 100%  Vital signs normal    Physical Exam  Nursing note and vitals reviewed. Constitutional: He is oriented to person, place, and time.  Non-toxic appearance. He does not appear ill. No distress.       Thin slightly ill kept  HENT:  Head: Normocephalic and atraumatic.  Right Ear: External ear normal.  Left Ear: External ear normal.  Nose: Nose normal. No mucosal edema or rhinorrhea.  Mouth/Throat: Oropharynx is clear and moist and mucous membranes are normal. No dental abscesses or uvula swelling.  Eyes: Conjunctivae and EOM are normal. Pupils are equal, round, and reactive to light.  Neck: Normal range of motion and full passive range of motion without pain. Neck supple.  Cardiovascular: Normal rate, regular rhythm and normal heart sounds.  Exam reveals no gallop and no friction rub.   No murmur heard. Pulmonary/Chest: Effort normal and breath sounds normal. No respiratory distress. He has no wheezes. He has no rhonchi. He has no rales. He exhibits no tenderness and no crepitus.  Abdominal: Soft. Normal appearance and bowel sounds are normal. He exhibits no distension. There is no tenderness. There is no rebound and no guarding.  Musculoskeletal: Normal range of motion. He exhibits no edema and no tenderness.       Patient has extension of his feet consistent that the patient he does not walk. He has some mild swelling of his foot on the right however it appears symmetrical with the one on the left. He has no obvious deformity to his ankle and he does not appear to have a lot of pain in his ankle or foot. However these were the areas he said he was having pain at home. He has no deformity of his upper arms or in his knees.  Neurological: He is alert and oriented to person, place, and time. He has normal strength. No cranial nerve deficit.  Skin: Skin is warm, dry and intact. No rash noted. No erythema. No pallor.   Psychiatric: He has a normal mood and affect. His speech is normal and behavior is normal. His mood appears not anxious.    ED Course  Procedures (including critical care time)  Pt refused pain medication. His main concern seems to be getting something to eat. He says he doesn't need any pain meds to go home   Dg Ankle Complete Right  06/07/2012  *RADIOLOGY REPORT*  Clinical Data: Right ankle pain post fall  RIGHT ANKLE - COMPLETE 3+ VIEW  Comparison: None  Findings: Soft tissue swelling greatest laterally. Ankle mortise intact. Question mild osseous demineralization. No ankle fracture or dislocation identified. Fracture identified at base of fifth metatarsal. No additional focal osseous abnormalities identified.  IMPRESSION: Minimally distracted fracture at base of right fifth metatarsal.   Original Report Authenticated By: Lollie Marrow, M.D.    Dg Foot Complete Right  06/07/2012  *RADIOLOGY REPORT*  Clinical Data: Right foot pain post fall  RIGHT FOOT COMPLETE - 3+ VIEW  Comparison: None  Findings: Soft tissue swelling greatest at dorsum of foot. Bones appear demineralized. Minimally distracted fracture identified at base of right fifth metatarsal. Distortion on AP view due to foot/ankle flexion. Nondiagnostic oblique view. No additional fracture, dislocation, or bone destruction identified.  IMPRESSION: Distracted fracture at base of right fifth metatarsal.   Original Report Authenticated By: Lollie Marrow, M.D.      1. Fall   2. Fx metatarsal     Plan discharge  Devoria Albe, MD, FACEP   MDM          Ward Givens, MD 06/08/12 [id number]

## 2012-06-08 NOTE — ED Notes (Signed)
[id number]<AV> Expected date:<BR> Expected time:<BR> Means of arrival:<BR> Comments:<BR> Closed

## 2012-06-08 NOTE — ED Notes (Signed)
Called and left a message on the social work line [phone] for placement

## 2012-06-08 NOTE — ED Notes (Signed)
Per Charge RN Vergia Alcon this patient is only here because of saftey issues regarding transportation home. If patient is unable to have safe transportation home this am, then RN is to involve Social Work for transportation or possible placement.

## 2012-06-08 NOTE — Progress Notes (Addendum)
CSW consulted by ED RN to assist with placement. CSW met with patient to assess his needs. Patient states that he lives with his roommate, Colin Bradley [phone]) at [facility] and states he plans to continue to live at [employer]. CSW spoke with patient's father, Colin Bradley [phone]) to confirm plans to return to apartment when discharged. Father informed CSW that patient has a DSS Caseworker, Colin Bradley [phone]) who looks after him for his overall well-being (groceries, arranges care, etc..). CSW left message for Colin Bradley, awaiting call back. Anticipating discharge tomorrow back to apartment. CSW to follow-up tomorrow.   [name], LCSW [facility] Clinical Social Worker cell #: [phone]

## 2012-06-08 NOTE — ED Notes (Signed)
Pt incontinent of stool. New diaper placed on pt. Linens changed. Pt repositioned for comfort.

## 2012-06-08 NOTE — ED Notes (Signed)
Call placed to patient residence. Pt has roommate who is disabled and unable to care for or assist patient. Patient has home health care that will "be there in the morning" per roommate. Patient is to stay in TCU until morning and attempt to transfer back to regular home environment. Phone number on face sheet is correct. MD Caparossi verified this plan. Report given to [employer]. Pt transferred to TCU in NAD, respirations even, unlabored, no complaints.

## 2012-06-08 NOTE — ED Notes (Signed)
Paged social work

## 2012-06-09 MED ORDER — PANTOPRAZOLE SODIUM 40 MG PO TBEC
40.0000 mg | DELAYED_RELEASE_TABLET | Freq: Two times a day (BID) | ORAL | Status: DC
  Administered 2012-06-09 – 2012-06-10 (×3): 40 mg via ORAL
  Filled 2012-06-09 (×2): qty 1

## 2012-06-09 MED ORDER — FENOFIBRATE 160 MG PO TABS
160.0000 mg | ORAL_TABLET | Freq: Every day | ORAL | Status: DC
  Administered 2012-06-09 – 2012-06-10 (×2): 160 mg via ORAL
  Filled 2012-06-09 (×2): qty 1

## 2012-06-09 MED ORDER — ADULT MULTIVITAMIN W/MINERALS CH
1.0000 | ORAL_TABLET | Freq: Every day | ORAL | Status: DC
  Administered 2012-06-09 – 2012-06-10 (×2): 1 via ORAL
  Filled 2012-06-09 (×2): qty 1

## 2012-06-09 MED ORDER — LORAZEPAM 2 MG/ML IJ SOLN
INTRAMUSCULAR | Status: AC
  Filled 2012-06-09: qty 1

## 2012-06-09 MED ORDER — CLONIDINE HCL 0.1 MG PO TABS
0.2000 mg | ORAL_TABLET | Freq: Every day | ORAL | Status: DC
  Administered 2012-06-09: 0.2 mg via ORAL
  Filled 2012-06-09: qty 2

## 2012-06-09 MED ORDER — PRAZOSIN HCL 2 MG PO CAPS
2.0000 mg | ORAL_CAPSULE | Freq: Every day | ORAL | Status: DC
  Administered 2012-06-09: 2 mg via ORAL
  Filled 2012-06-09 (×2): qty 1

## 2012-06-09 MED ORDER — LORAZEPAM 1 MG PO TABS
1.0000 mg | ORAL_TABLET | Freq: Three times a day (TID) | ORAL | Status: DC
  Administered 2012-06-09 – 2012-06-10 (×4): 1 mg via ORAL
  Filled 2012-06-09 (×4): qty 1

## 2012-06-09 MED ORDER — LACOSAMIDE 150 MG PO TABS: 150.0000 mg | ORAL_TABLET | Freq: Two times a day (BID) | ORAL | Status: DC

## 2012-06-09 MED ORDER — TAMSULOSIN HCL 0.4 MG PO CAPS
0.4000 mg | ORAL_CAPSULE | Freq: Every day | ORAL | Status: DC
  Administered 2012-06-09 – 2012-06-10 (×2): 0.4 mg via ORAL
  Filled 2012-06-09: qty 1

## 2012-06-09 MED ORDER — ABC PLUS SENIOR PO TABS: 1.0000 | ORAL_TABLET | Freq: Every day | ORAL | Status: DC

## 2012-06-09 MED ORDER — DUTASTERIDE-TAMSULOSIN HCL 0.5-0.4 MG PO CAPS: 1.0000 | ORAL_CAPSULE | Freq: Every day | ORAL | Status: DC

## 2012-06-09 MED ORDER — AMITRIPTYLINE HCL 25 MG PO TABS
25.0000 mg | ORAL_TABLET | Freq: Every day | ORAL | Status: DC
  Administered 2012-06-09: 25 mg via ORAL
  Filled 2012-06-09: qty 1

## 2012-06-09 MED ORDER — LORAZEPAM 2 MG/ML IJ SOLN
1.0000 mg | Freq: Once | INTRAMUSCULAR | Status: AC
  Administered 2012-06-09: 1 mg via INTRAVENOUS

## 2012-06-09 MED ORDER — LACOSAMIDE 50 MG PO TABS
150.0000 mg | ORAL_TABLET | Freq: Two times a day (BID) | ORAL | Status: DC
  Administered 2012-06-09 – 2012-06-10 (×3): 150 mg via ORAL
  Filled 2012-06-09: qty 3
  Filled 2012-06-09: qty 1
  Filled 2012-06-09: qty 3

## 2012-06-09 MED ORDER — DUTASTERIDE 0.5 MG PO CAPS
0.5000 mg | ORAL_CAPSULE | Freq: Every day | ORAL | Status: DC
  Administered 2012-06-09 – 2012-06-10 (×2): 0.5 mg via ORAL
  Filled 2012-06-09 (×2): qty 1

## 2012-06-09 NOTE — ED Notes (Signed)
Witnessed patient having a grand mal seizure. Patient's head was locked back and all extremities were vigorously shaking. RN and Tech at the bedside and protected airway by placing nasal canula at 2L on and placing head of bed higher to prevent aspiration. Placed patient on monitor to check vitals and heart rate. Patient's oxygen sats were in the mid 80's during the seizure, but climbed to the 90's when the seizure settled down. Seizure lasted about one minute. IV was placed in left upper arm, and MD made aware of seizure. Orders were followed to give IV Ativan 1mg . During seizure, heart rate averaged in the 160's, then settled to the 110's after the Ativan was administered. Will continue to monitor and report and further seizures (patient has hx of this)

## 2012-06-09 NOTE — Progress Notes (Signed)
This CSW has left a message for DSS caseworker to contact us- await callback- patient and his father anticipate d/c back to [facility] Living apartment setting as prior to this admit.  Reece Levy, MSW, Theresia Majors [phone]

## 2012-06-09 NOTE — Progress Notes (Signed)
Spoke with Marcelino Duster who states she is Mr. Lippy "caregiver" and she works for [employer]- she requests that I contact her supervisor for further questions and is resistant to further discussion. I have left a message for her supervisor, Pieter Partridge, [phone], and await call back.  Reece Levy, MSW, Theresia Majors [phone]

## 2012-06-09 NOTE — ED Provider Notes (Signed)
Pt hadgeneralized sz withnessed by rN lastin1 1 -2 minutes .at 1130 am . presemntly pt awake ,alert lungs cta cor rrr tachycardic. atrivan ordered. Pt has been off of anticinvulsants since arrival here. meds renewed  Date: 06/09/2012  Rate: 150  Rhythm: sinus tachycardia  QRS Axis: left  Intervals: normal  ST/T Wave abnormalities: nonspecific ST changes  Conduction Disutrbances:right bundle branch block  Narrative Interpretation:   Old EKG Reviewed: tracing form 05/16/12 showed sinus tachy 105 /min, o/w no signif,. change   Doug Sou, MD 06/09/12 1213

## 2012-06-09 NOTE — Care Management Note (Unsigned)
    Page 1 of 1   06/08/2012     4:08:15 PM   CARE MANAGEMENT NOTE 06/08/2012  Patient:  Colin Bradley, Colin Bradley   Account Number:  [id number]  Date Initiated:  06/08/2012  Documentation initiated by:  Lanier Clam  Subjective/Objective Assessment:   RECEIVED CALL TO ASSESS D/C PLANS.HX:MR.     Action/Plan:   FROM HOME W/DISABLED ROOMMATE.RECEIVES CAREGIVER SERVICES.HAS MEDICAID CASE WORKER-SW FOLLOWING.   Anticipated DC Date:  06/09/2012   Anticipated DC Plan:  HOME/SELF CARE  In-house referral  Clinical Social Worker      DC Planning Services  CM consult      [facility]  Resumption Of Svcs/PTA Provider   Choice offered to / List presented to:             Status of service:  In process, will continue to follow Medicare Important Message given?   (If response is "NO", the following Medicare IM given date fields will be blank) Date Medicare IM given:   Date Additional Medicare IM given:    Discharge Disposition:  HOME/SELF CARE  Per UR Regulation:    If discussed at Long Length of Stay Meetings, dates discussed:    Comments:  06/08/12 Ashrith Sagan RN,BSN NCM [phone] SW FOLLOWING FOR D/C BACK TO APARTMENT TOMORROW ONCE SHE TALKS TO CASE WORKER.CASE MGR FOLLOWING IF NEEDED.

## 2012-06-09 NOTE — ED Notes (Signed)
Pt is here for fall ,  He lives with roommate at home,  He had a seizure today per report received from [facility],  Pt also has IV access and is on cardiac monitor, noted to have elevated heart rate

## 2012-06-10 ENCOUNTER — Emergency Department (HOSPITAL_COMMUNITY): Payer: Medicare Other

## 2012-06-10 LAB — CBC WITH DIFFERENTIAL/PLATELET
HCT: 39.6 % (ref 39.0–52.0)
Hemoglobin: 13.7 g/dL (ref 13.0–17.0)
Lymphocytes Relative: 24 % (ref 12–46)
MCHC: 34.6 g/dL (ref 30.0–36.0)
Monocytes Absolute: 0.6 10*3/uL (ref 0.1–1.0)
Monocytes Relative: 10 % (ref 3–12)
Neutro Abs: 4.3 10*3/uL (ref 1.7–7.7)
WBC: 6.6 10*3/uL (ref 4.0–10.5)

## 2012-06-10 LAB — URINALYSIS, ROUTINE W REFLEX MICROSCOPIC
Hgb urine dipstick: NEGATIVE
Leukocytes, UA: NEGATIVE
Protein, ur: NEGATIVE mg/dL
Urobilinogen, UA: 0.2 mg/dL (ref 0.0–1.0)

## 2012-06-10 LAB — COMPREHENSIVE METABOLIC PANEL
BUN: 23 mg/dL (ref 6–23)
CO2: 26 mEq/L (ref 19–32)
Chloride: 102 mEq/L (ref 96–112)
Creatinine, Ser: 1.15 mg/dL (ref 0.50–1.35)
GFR calc non Af Amer: 67 mL/min — ABNORMAL LOW (ref >90)
Glucose, Bld: 119 mg/dL — ABNORMAL HIGH (ref 70–99)
Total Bilirubin: 0.6 mg/dL (ref 0.3–1.2)

## 2012-06-10 MED ORDER — SODIUM CHLORIDE 0.9 % IV BOLUS (SEPSIS)
500.0000 mL | Freq: Once | INTRAVENOUS | Status: AC
  Administered 2012-06-10: 500 mL via INTRAVENOUS

## 2012-06-10 MED ORDER — SODIUM CHLORIDE 0.9 % IV BOLUS (SEPSIS)
1000.0000 mL | Freq: Once | INTRAVENOUS | Status: AC
  Administered 2012-06-10: 1000 mL via INTRAVENOUS

## 2012-06-10 NOTE — ED Notes (Signed)
[facility] here to pick up patient 

## 2012-06-10 NOTE — ED Notes (Signed)
Report received from [facility].  Pt returning to ED from TCU to be admitted as an inpatient.  Pt is MR and lives with MR roommate. Pt alert, able to understand directions and uses urinal independently.

## 2012-06-10 NOTE — ED Notes (Signed)
Bed:WA02<BR> Expected date:<BR> Expected time:<BR> Means of arrival:<BR> Comments:<BR> closed

## 2012-06-10 NOTE — ED Notes (Signed)
Called [facility] to transport patient home.  

## 2012-06-10 NOTE — ED Notes (Signed)
Pt eating sandwich and drinking a drink.  Waiting for ride home.

## 2012-06-10 NOTE — ED Notes (Signed)
Replaced blanket padding with actual seizure pads

## 2012-06-10 NOTE — ED Provider Notes (Signed)
4:25 PM The patient is much better at this time.  Heart rate is down to 104 after 1 L of IV fluids.  The patient has a right metatarsal fracture for which she will followup with orthopedics.  The patient is nonambulatory home.  He has a place to live and he will be transported back by [facility].  PCP and orthopedic followup.  Labs and chest x-ray within normal limits.  His seizure that occurred yesterday was secondary to not being placed on his antiseizure medicines.  He will resume all his medications when he returns home.  Lyanne Co, MD 06/10/12 [phone]

## 2012-06-10 NOTE — ED Provider Notes (Signed)
History     CSN: [id number]  Arrival date & time 06/07/12  2007   First MD Initiated Contact with Patient 06/07/12 2137      Chief Complaint  Patient presents with  . Fall    (Consider location/radiation/quality/duration/timing/severity/associated sxs/prior treatment) HPI Asked by nursing staff to eval pt. Pt initially seen several days ago for fall from wheel chair and foot fracture was diagnosed. Pt was felt to be at baseline and d/c'd. Apparently there was some difficulty arranging his transport home and he stayed in the CDU. He had a seizure yesterday and his home meds were continued. Pt has been persistently tachycardic up to 150's. Currently 120's-130's. Apparent baseline is 90-100. He had a low grade fever yesterday. Pt c/o cough but denies HA, neck pain/stiffness, CP, SOB, Abd pain, N/V/D, urinary symptoms. He is wheelchair bound due to CP. Will readmit to ED, draw labs, CXR, EKG and give IVF's.   Past Medical History  Diagnosis Date  . Seizures   . Cerebral palsy   . Hypertension   . Mental retardation   . Depression     History reviewed. No pertinent past surgical history.  No family history on file.  History  Substance Use Topics  . Smoking status: Never Smoker   . Smokeless tobacco: Not on file  . Alcohol Use: No      Review of Systems  Constitutional: Positive for fever. Negative for chills.  HENT: Negative for neck pain and neck stiffness.   Respiratory: Positive for cough. Negative for shortness of breath and wheezing.   Cardiovascular: Negative for chest pain and palpitations.  Gastrointestinal: Negative for nausea, vomiting, abdominal pain and diarrhea.  Genitourinary: Negative for dysuria and hematuria.  Musculoskeletal: Negative for back pain.  Skin: Negative for rash and wound.  Neurological: Negative for dizziness, light-headedness, numbness and headaches.    Allergies  Review of patient's allergies indicates no known allergies.  Home  Medications   Current Outpatient Rx  Name Route Sig Dispense Refill  . AMITRIPTYLINE HCL 25 MG PO TABS Oral Take 25 mg by mouth at bedtime.     Marland Kitchen CLONIDINE HCL 0.2 MG PO TABS Oral Take 0.2 mg by mouth at bedtime.     . DUTASTERIDE-TAMSULOSIN HCL 0.5-0.4 MG PO CAPS Oral Take 1 tablet by mouth daily.    . FENOFIBRATE 160 MG PO TABS Oral Take 160 mg by mouth daily.     Marland Kitchen LACOSAMIDE 150 MG PO TABS Oral Take 150 mg by mouth 2 (two) times daily.    Marland Kitchen LORAZEPAM 1 MG PO TABS Oral Take 1 mg by mouth 3 (three) times daily.    . ABC PLUS SENIOR PO TABS Oral Take 1 tablet by mouth daily.     Marland Kitchen PANTOPRAZOLE SODIUM 40 MG PO TBEC Oral Take 40 mg by mouth 2 (two) times daily.     Marland Kitchen PRAZOSIN HCL 2 MG PO CAPS Oral Take 2 mg by mouth at bedtime.       BP 116/92  Pulse 104  Temp 97.9 F (36.6 C) (Oral)  Resp 20  SpO2 98%  Physical Exam  Nursing note and vitals reviewed. Constitutional: He is oriented to person, place, and time. He appears well-developed and well-nourished. No distress.  HENT:  Head: Normocephalic and atraumatic.       Mucosa dry  Eyes: EOM are normal. Pupils are equal, round, and reactive to light.  Neck: Normal range of motion. Neck supple.       No  meningismus, no mid line TTP  Cardiovascular: Regular rhythm.        Tachycardia  Pulmonary/Chest: Effort normal and breath sounds normal. No respiratory distress. He has no wheezes. He has no rales.  Abdominal: Soft. Bowel sounds are normal. There is no tenderness. There is no rebound and no guarding.  Musculoskeletal: Normal range of motion. He exhibits no edema and no tenderness.  Neurological: He is alert and oriented to person, place, and time.       Upper ext 4/5, lower ext atrophied 1/5 motor  Skin: Skin is warm and dry. No rash noted. No erythema.  Psychiatric: He has a normal mood and affect. His behavior is normal.    ED Course  Procedures (including critical care time)   Labs Reviewed  CBC WITH DIFFERENTIAL    COMPREHENSIVE METABOLIC PANEL  URINALYSIS, ROUTINE W REFLEX MICROSCOPIC   No results found.   1. Fall   2. Fx metatarsal      Date: 06/10/2012  Rate: 115  Rhythm: sinus tachycardia  QRS Axis: normal  Intervals: normal  ST/T Wave abnormalities: normal  Conduction Disutrbances:none  Narrative Interpretation:   Old EKG Reviewed: unchanged  '  MDM          Loren Racer, MD 06/12/12 [phone]

## 2012-06-10 NOTE — ED Notes (Signed)
Pt requesting strawberry ice cream.  Called service response and they are bringing it up

## 2012-06-12 ENCOUNTER — Emergency Department (HOSPITAL_COMMUNITY): Payer: Medicare Other

## 2012-06-12 ENCOUNTER — Encounter (HOSPITAL_COMMUNITY): Payer: Self-pay | Admitting: Emergency Medicine

## 2012-06-12 ENCOUNTER — Inpatient Hospital Stay (HOSPITAL_COMMUNITY)
Admission: EM | Admit: 2012-06-12 | Discharge: 2012-06-15 | DRG: 392 | Disposition: A | Discharge status: Nursing Home (Includes ICF) | Payer: Medicare Other | Attending: Endocrinology | Admitting: Endocrinology

## 2012-06-12 DIAGNOSIS — I10 Essential (primary) hypertension: Secondary | ICD-10-CM | POA: Diagnosis present

## 2012-06-12 DIAGNOSIS — D649 Anemia, unspecified: Secondary | ICD-10-CM | POA: Diagnosis present

## 2012-06-12 DIAGNOSIS — Z79899 Other long term (current) drug therapy: Secondary | ICD-10-CM

## 2012-06-12 DIAGNOSIS — K922 Gastrointestinal hemorrhage, unspecified: Secondary | ICD-10-CM | POA: Diagnosis present

## 2012-06-12 DIAGNOSIS — R000 Tachycardia, unspecified: Secondary | ICD-10-CM

## 2012-06-12 DIAGNOSIS — F32A Depression, unspecified: Secondary | ICD-10-CM | POA: Diagnosis present

## 2012-06-12 DIAGNOSIS — R195 Other fecal abnormalities: Secondary | ICD-10-CM | POA: Diagnosis present

## 2012-06-12 DIAGNOSIS — Z87898 Personal history of other specified conditions: Secondary | ICD-10-CM | POA: Diagnosis present

## 2012-06-12 DIAGNOSIS — K2100 Gastro-esophageal reflux disease with esophagitis, without bleeding: Principal | ICD-10-CM | POA: Diagnosis present

## 2012-06-12 DIAGNOSIS — F329 Major depressive disorder, single episode, unspecified: Secondary | ICD-10-CM | POA: Diagnosis present

## 2012-06-12 DIAGNOSIS — R799 Abnormal finding of blood chemistry, unspecified: Secondary | ICD-10-CM

## 2012-06-12 DIAGNOSIS — F79 Unspecified intellectual disabilities: Secondary | ICD-10-CM | POA: Diagnosis present

## 2012-06-12 DIAGNOSIS — F3289 Other specified depressive episodes: Secondary | ICD-10-CM | POA: Diagnosis present

## 2012-06-12 DIAGNOSIS — E876 Hypokalemia: Secondary | ICD-10-CM | POA: Diagnosis present

## 2012-06-12 DIAGNOSIS — G809 Cerebral palsy, unspecified: Secondary | ICD-10-CM | POA: Diagnosis present

## 2012-06-12 DIAGNOSIS — K209 Esophagitis, unspecified without bleeding: Secondary | ICD-10-CM | POA: Diagnosis present

## 2012-06-12 DIAGNOSIS — K449 Diaphragmatic hernia without obstruction or gangrene: Secondary | ICD-10-CM | POA: Diagnosis present

## 2012-06-12 DIAGNOSIS — Z993 Dependence on wheelchair: Secondary | ICD-10-CM

## 2012-06-12 DIAGNOSIS — G40909 Epilepsy, unspecified, not intractable, without status epilepticus: Secondary | ICD-10-CM | POA: Diagnosis present

## 2012-06-12 HISTORY — DX: Benign prostatic hyperplasia without lower urinary tract symptoms: N40.0

## 2012-06-12 HISTORY — DX: Calculus of kidney: N20.0

## 2012-06-12 HISTORY — DX: Other specified anxiety disorders: F41.8

## 2012-06-12 HISTORY — DX: Gastrointestinal hemorrhage, unspecified: K92.2

## 2012-06-12 HISTORY — DX: Esophageal obstruction: K22.2

## 2012-06-12 LAB — COMPREHENSIVE METABOLIC PANEL
ALT: 11 U/L (ref 0–53)
Calcium: 10.2 mg/dL (ref 8.4–10.5)
GFR calc Af Amer: >90 mL/min (ref >90)
Glucose, Bld: 113 mg/dL — ABNORMAL HIGH (ref 70–99)
Sodium: 143 mEq/L (ref 135–145)
Total Protein: 6.6 g/dL (ref 6.0–8.3)

## 2012-06-12 LAB — URINALYSIS, ROUTINE W REFLEX MICROSCOPIC
Bilirubin Urine: NEGATIVE
Leukocytes, UA: NEGATIVE
Nitrite: NEGATIVE
Specific Gravity, Urine: 1.028 (ref 1.005–1.030)
pH: 5 (ref 5.0–8.0)

## 2012-06-12 LAB — CBC WITH DIFFERENTIAL/PLATELET
Basophils Absolute: 0 10*3/uL (ref 0.0–0.1)
Basophils Relative: 0 % (ref 0–1)
Hemoglobin: 12.7 g/dL — ABNORMAL LOW (ref 13.0–17.0)
MCHC: 34.4 g/dL (ref 30.0–36.0)
Neutro Abs: 7.9 10*3/uL — ABNORMAL HIGH (ref 1.7–7.7)
Neutrophils Relative %: 79 % — ABNORMAL HIGH (ref 43–77)
Platelets: 264 10*3/uL (ref 150–400)
RDW: 13.2 % (ref 11.5–15.5)

## 2012-06-12 LAB — POCT I-STAT TROPONIN I: Troponin i, poc: 0.02 ng/mL (ref 0.00–0.08)

## 2012-06-12 LAB — LIPASE, BLOOD: Lipase: 32 U/L (ref 11–59)

## 2012-06-12 LAB — OCCULT BLOOD, POC DEVICE: Fecal Occult Bld: POSITIVE

## 2012-06-12 MED ORDER — MORPHINE SULFATE 2 MG/ML IJ SOLN: 2.0000 mg | INTRAMUSCULAR | Status: DC | PRN

## 2012-06-12 MED ORDER — SODIUM CHLORIDE 0.9 % IV BOLUS (SEPSIS)
1000.0000 mL | Freq: Once | INTRAVENOUS | Status: AC
  Administered 2012-06-12: 1000 mL via INTRAVENOUS

## 2012-06-12 MED ORDER — ONDANSETRON HCL 4 MG/2ML IJ SOLN: 4.0000 mg | Freq: Four times a day (QID) | INTRAMUSCULAR | Status: DC | PRN

## 2012-06-12 MED ORDER — ONDANSETRON HCL 4 MG PO TABS: 4.0000 mg | ORAL_TABLET | Freq: Four times a day (QID) | ORAL | Status: DC | PRN

## 2012-06-12 MED ORDER — FENOFIBRATE 160 MG PO TABS
160.0000 mg | ORAL_TABLET | Freq: Every day | ORAL | Status: DC
  Administered 2012-06-13 – 2012-06-15 (×3): 160 mg via ORAL
  Filled 2012-06-12 (×3): qty 1

## 2012-06-12 MED ORDER — ADULT MULTIVITAMIN W/MINERALS CH
1.0000 | ORAL_TABLET | Freq: Every day | ORAL | Status: DC
  Administered 2012-06-13 – 2012-06-15 (×3): 1 via ORAL
  Filled 2012-06-12 (×3): qty 1

## 2012-06-12 MED ORDER — SODIUM CHLORIDE 0.9 % IV SOLN
INTRAVENOUS | Status: DC
  Administered 2012-06-12: via INTRAVENOUS
  Administered 2012-06-13: 20 mL/h via INTRAVENOUS

## 2012-06-12 MED ORDER — IOHEXOL 300 MG/ML  SOLN
80.0000 mL | Freq: Once | INTRAMUSCULAR | Status: AC | PRN
  Administered 2012-06-12: 80 mL via INTRAVENOUS

## 2012-06-12 MED ORDER — SODIUM CHLORIDE 0.9 % IV SOLN
8.0000 mg/h | INTRAVENOUS | Status: DC
  Administered 2012-06-12: 8 mg/h via INTRAVENOUS
  Filled 2012-06-12 (×2): qty 80

## 2012-06-12 MED ORDER — SODIUM CHLORIDE 0.9 % IV SOLN
INTRAVENOUS | Status: DC
  Administered 2012-06-12: 21:00:00 via INTRAVENOUS

## 2012-06-12 MED ORDER — LACOSAMIDE 100 MG PO TABS
1.0000 | ORAL_TABLET | Freq: Every day | ORAL | Status: DC
  Filled 2012-06-12: qty 1

## 2012-06-12 MED ORDER — LORAZEPAM 1 MG PO TABS
1.0000 mg | ORAL_TABLET | Freq: Three times a day (TID) | ORAL | Status: DC
  Administered 2012-06-12 – 2012-06-15 (×9): 1 mg via ORAL
  Filled 2012-06-12 (×9): qty 1

## 2012-06-12 MED ORDER — HYDROCODONE-ACETAMINOPHEN 5-325 MG PO TABS: 1.0000 | ORAL_TABLET | ORAL | Status: DC | PRN

## 2012-06-12 MED ORDER — ONDANSETRON HCL 8 MG PO TABS: 4.0000 mg | ORAL_TABLET | Freq: Four times a day (QID) | ORAL | Status: DC | PRN

## 2012-06-12 MED ORDER — SODIUM CHLORIDE 0.9 % IV BOLUS (SEPSIS): 1000.0000 mL | Freq: Once | INTRAVENOUS | Status: DC

## 2012-06-12 MED ORDER — AMITRIPTYLINE HCL 25 MG PO TABS
25.0000 mg | ORAL_TABLET | Freq: Every day | ORAL | Status: DC
  Administered 2012-06-12 – 2012-06-14 (×3): 25 mg via ORAL
  Filled 2012-06-12 (×4): qty 1

## 2012-06-12 MED ORDER — IOHEXOL 300 MG/ML  SOLN
20.0000 mL | INTRAMUSCULAR | Status: AC
  Administered 2012-06-12 (×2): 20 mL via ORAL

## 2012-06-12 MED ORDER — SODIUM CHLORIDE 0.9 % IV SOLN
INTRAVENOUS | Status: AC
  Administered 2012-06-12: 21:00:00 via INTRAVENOUS

## 2012-06-12 MED ORDER — HEPARIN SODIUM (PORCINE) 5000 UNIT/ML IJ SOLN: 5000.0000 [IU] | Freq: Three times a day (TID) | INTRAMUSCULAR | Status: DC

## 2012-06-12 MED ORDER — SODIUM CHLORIDE 0.9 % IV SOLN: 8.0000 mg/h | INTRAVENOUS | Status: DC

## 2012-06-12 MED ORDER — ONDANSETRON HCL 4 MG/2ML IJ SOLN: 4.0000 mg | Freq: Three times a day (TID) | INTRAMUSCULAR | Status: AC | PRN

## 2012-06-12 MED ORDER — CLONIDINE HCL 0.2 MG PO TABS
0.2000 mg | ORAL_TABLET | Freq: Every day | ORAL | Status: DC
  Administered 2012-06-12 – 2012-06-14 (×3): 0.2 mg via ORAL
  Filled 2012-06-12 (×4): qty 1

## 2012-06-12 MED ORDER — SODIUM CHLORIDE 0.9 % IV SOLN
80.0000 mg | INTRAVENOUS | Status: DC
  Filled 2012-06-12: qty 80

## 2012-06-12 NOTE — H&P (Signed)
[facility]                                                                                    Patient Demographics  Colin Bradley, is a 61 y.o. male  CSN: [id number]  MRN: [id number]  DOB - 01-07-1951  Admit Date - 06/12/2012  Outpatient Primary MD for the patient is Julian Hy, MD   With History of -  Past Medical History  Diagnosis Date  . Seizures   . Cerebral palsy   . Hypertension   . Mental retardation   . Depression       History reviewed. No pertinent past surgical history.  in for   Chief Complaint  Patient presents with  . Abdominal Pain     HPI  Colin Bradley  is a 61 y.o. male, with significant past medical history as mentioned above, mainly for cerebral palsy, pleasant ,presents with complaints of abdominal pain started this afternoon, presentation to ED patient reports his abdominal pain was resolved, and his abdomen exam was benign, had lipase within normal limits, no fever, no leukocytosis, had CT abdomen and pelvis done which did show evidence of esophagitis and hiatal hernia, patient had Hemoccult done in ED which came back Hemoccult positive, patient denies any melena, bright red blood per rectum, as well denies any coffee-ground emesis, any nausea or vomiting, the Hemoccult was done as patient had very mild drop in his hemoglobin from 13.7-12.7, patient was not orthostatic or tachycardic, he was started on Protonix drip in ED, patient is very poor historian, by reviewing his medical records he had recent colonoscopy in December of 2011 which was significant for mild diverticulosis and internal hemorrhoids, as well had EGD in November 2011 showing hiatal hernia and esophagitis.    Review of Systems    In addition to the HPI above,  No Fever-chills, No Headache, No changes with Vision or hearing, No problems swallowing food or Liquids, No Chest pain, Cough or Shortness of Breath, Reports currently his abdominal pain resolved,  No Nausea or Vommitting, Bowel movements are regular, No Blood in stool or Urine, No dysuria, No new skin rashes or bruises, No new joints pains-aches,  No new weakness, tingling, numbness in any extremity, No recent weight gain or loss, No polyuria, polydypsia or polyphagia, No significant Mental Stressors.  A full 10 point Review of Systems was done, except as stated above, all other Review of Systems were negative.   Social History History  Substance Use Topics  . Smoking status: Never Smoker   . Smokeless tobacco: Not on file  . Alcohol Use: No     Family History No family history on file.   Prior to Admission medications   Medication Sig Start Date End Date Taking? Authorizing Provider  amitriptyline (ELAVIL) 25 MG tablet Take 25 mg by mouth at bedtime.    Yes Historical Provider, MD  cloNIDine (CATAPRES) 0.2 MG tablet Take 0.2 mg by mouth at bedtime.    Yes Historical Provider, MD  Dutasteride-Tamsulosin HCl (JALYN) 0.5-0.4 MG CAPS Take 1 tablet by mouth daily.   Yes Historical Provider, MD  fenofibrate 160 MG tablet Take 160 mg by  mouth daily.    Yes Historical Provider, MD  Lacosamide (VIMPAT) 100 MG TABS Take 1 tablet by mouth daily.   Yes Historical Provider, MD  LORazepam (ATIVAN) 1 MG tablet Take 1 mg by mouth 3 (three) times daily.   Yes Historical Provider, MD  pantoprazole (PROTONIX) 40 MG tablet Take 40 mg by mouth 2 (two) times daily.    Yes Historical Provider, MD  prazosin (MINIPRESS) 2 MG capsule Take 2 mg by mouth at bedtime.    Yes Historical Provider, MD  Multiple Vitamins-Minerals (ABC PLUS SENIOR) TABS Take 1 tablet by mouth daily.     Historical Provider, MD    No Known Allergies  Physical Exam  Vitals  Blood pressure 133/78, pulse 124, temperature 98 F (36.7 C), temperature source Oral, resp. rate 12, weight 50.6 kg (111 lb 8.8 oz), SpO2 100.00%.   1. General lying in lying in bed in NAD,    2. patient is*wishes but inappropriately,  3.  No F.N deficits, ALL C.Nerves Intact, Strength 5/5 all 4 extremities, Sensation intact all 4 extremities, Plantars down going.  4. Ears and Eyes appear Normal, Conjunctivae clear, PERRLA. Moist Oral Mucosa.  5. Supple Neck, No JVD, No cervical lymphadenopathy appriciated, No Carotid Bruits.  6. Symmetrical Chest wall movement, Good air movement bilaterally, CTAB.  7. RRR, No Gallops, Rubs or Murmurs, No Parasternal Heave.  8. Positive Bowel Sounds, Abdomen Soft, Non tender, No organomegaly appriciated,No rebound -guarding or rigidity.  9.  No Cyanosis, Normal Skin Turgor, No Skin Rash or Bruise.  10. Good muscle tone,  joints appear normal , no effusions, Normal ROM.  11. No Palpable Lymph Nodes in Neck or Axillae    Data Review  CBC  Lab 06/12/12 1714 06/10/12 1428  WBC 10.0 6.6  HGB 12.7* 13.7  HCT 36.9* 39.6  PLT 264 285  MCV 94.9 93.2  MCH 32.6 32.2  MCHC 34.4 34.6  RDW 13.2 13.2  LYMPHSABS 1.3 1.6  MONOABS 0.8 0.6  EOSABS 0.0 0.1  BASOSABS 0.0 0.0  BANDABS -- --   ------------------------------------------------------------------------------------------------------------------  Chemistries   Lab 06/12/12 1510 06/10/12 1428  NA 143 139  K 3.7 3.6  CL 106 102  CO2 26 26  GLUCOSE 113* 119*  BUN 49* 23  CREATININE 0.98 1.15  CALCIUM 10.2 9.8  MG -- --  AST 24 23  ALT 11 12  ALKPHOS 45 55  BILITOT 0.7 0.6   ------------------------------------------------------------------------------------------------------------------ CrCl is unknown because both a height and weight (above a minimum accepted value) are required for this calculation. ------------------------------------------------------------------------------------------------------------------ No results found for this basename: TSH,T4TOTAL,FREET3,T3FREE,THYROIDAB in the last 72 hours   Coagulation profile No results found for this basename: INR:5,PROTIME:5 in the last 168  hours ------------------------------------------------------------------------------------------------------------------- No results found for this basename: DDIMER:2 in the last 72 hours -------------------------------------------------------------------------------------------------------------------  Cardiac Enzymes No results found for this basename: CK:3,CKMB:3,TROPONINI:3,MYOGLOBIN:3 in the last 168 hours ------------------------------------------------------------------------------------------------------------------ No components found with this basename: POCBNP:3   ---------------------------------------------------------------------------------------------------------------  Urinalysis    Component Value Date/Time   COLORURINE YELLOW 06/12/2012 1809   APPEARANCEUR CLEAR 06/12/2012 1809   LABSPEC 1.028 06/12/2012 1809   PHURINE 5.0 06/12/2012 1809   GLUCOSEU NEGATIVE 06/12/2012 1809   HGBUR NEGATIVE 06/12/2012 1809   BILIRUBINUR NEGATIVE 06/12/2012 1809   KETONESUR NEGATIVE 06/12/2012 1809   PROTEINUR NEGATIVE 06/12/2012 1809   UROBILINOGEN 0.2 06/12/2012 1809   NITRITE NEGATIVE 06/12/2012 1809   LEUKOCYTESUR NEGATIVE 06/12/2012 1809    ----------------------------------------------------------------------------------------------------------------    Imaging results:  Dg Chest 2 View  05/20/2012  *RADIOLOGY REPORT*  Clinical Data: Seizure, hypertension  CHEST - 2 VIEW  Comparison: 03/27/2012  Findings: Cardiomediastinal silhouette is stable.  No acute infiltrate or pleural effusion.  No pulmonary edema.  Bony thorax is stable.  IMPRESSION: No active disease.  Original Report Authenticated By: Natasha Mead, M.D.   Dg Ankle Complete Right  06/07/2012  *RADIOLOGY REPORT*  Clinical Data: Right ankle pain post fall  RIGHT ANKLE - COMPLETE 3+ VIEW  Comparison: None  Findings: Soft tissue swelling greatest laterally. Ankle mortise intact. Question mild osseous demineralization. No ankle fracture or  dislocation identified. Fracture identified at base of fifth metatarsal. No additional focal osseous abnormalities identified.  IMPRESSION: Minimally distracted fracture at base of right fifth metatarsal.   Original Report Authenticated By: Lollie Marrow, M.D.    Ct Abdomen Pelvis W Contrast  06/12/2012  *RADIOLOGY REPORT*  Clinical Data: Right-sided abdominal pain.  Tachycardia.  CT ABDOMEN AND PELVIS WITH CONTRAST  Technique:  Multidetector CT imaging of the abdomen and pelvis was performed following the standard protocol during bolus administration of intravenous contrast.  Contrast: 80mL OMNIPAQUE IOHEXOL 300 MG/ML  SOLN  Comparison: 03/14/2010  Findings: Tiny right pleural effusion is noted.  There is incomplete visualization of a 1.9 cm nodular opacity in the right infrahilar region which could represent right hilar lymphadenopathy or pulmonary nodule.  A small hiatal hernia is again seen.  There is also diffuse wall thickening of the distal esophagus which is suspicious for esophagitis.  A 1.6 cm cyst in the lateral dome the liver is stable.  No liver masses are identified.  Gallbladder is unremarkable.  The stomach is distended by oral contrast but no obstructing mass or inflammatory process identified.  The spleen, pancreas, adrenal glands, and kidneys are normal in appearance.  No evidence of hydronephrosis.  No soft tissue masses or lymphadenopathy seen elsewhere within the abdomen or pelvis.  There is no evidence of inflammatory process or abnormal fluid collections.  No evidence of bowel wall thickening or dilatation.  IMPRESSION:  1.  Small hiatal hernia, with distal esophageal wall thickening suspicious for esophagitis.  Consider endoscopy for further evaluation. 2. Tiny right pleural effusion, with partial visualization of the nodular density in the right infrahilar region, which could represent right hilar lymphadenopathy or pulmonary nodule. Consider chest CT with contrast for further evaluation.    Original Report Authenticated By: Danae Orleans, M.D.    Dg Chest Port 1 View  06/12/2012  *RADIOLOGY REPORT*  Clinical Data: Right upper quadrant pain  PORTABLE CHEST - 1 VIEW  Comparison: 06/10/2012  Findings: Cardiomediastinal silhouette is stable.  No acute infiltrate or pleural effusion.  No pulmonary edema.  Bony thorax is stable.  IMPRESSION: No active disease.  No significant change.   Original Report Authenticated By: Natasha Mead, M.D.    Dg Chest Port 1 View  06/10/2012  *RADIOLOGY REPORT*  Clinical Data: Cough.  PORTABLE CHEST - 1 VIEW  Comparison: 05/20/2012  Findings: The lungs are clear without focal infiltrate, edema, pneumothorax or pleural effusion. Interstitial markings are diffusely coarsened with chronic features. Cardiopericardial silhouette is at upper limits of normal for size. Bones are diffusely demineralized. Telemetry leads overlie the chest.  IMPRESSION: Stable.  No acute findings.   Original Report Authenticated By: ERIC A. MANSELL, M.D.    Dg Foot Complete Right  06/07/2012  *RADIOLOGY REPORT*  Clinical Data: Right foot pain post fall  RIGHT FOOT COMPLETE - 3+ VIEW  Comparison: None  Findings: Soft tissue swelling greatest at dorsum of foot. Bones appear demineralized. Minimally distracted fracture identified at base of right fifth metatarsal. Distortion on AP view due to foot/ankle flexion. Nondiagnostic oblique view. No additional fracture, dislocation, or bone destruction identified.  IMPRESSION: Distracted fracture at base of right fifth metatarsal.   Original Report Authenticated By: Lollie Marrow, M.D.      Assessment & Plan  Active Problems:  Heme positive stool  ESOPHAGITIS  DEPRESSION, CHRONIC  CEREBRAL PALSY  BENIGN PROSTATIC HYPERTROPHY, HX OF    1. Hemoccult positive stool: There are no reports of melena, or blood per rectum or coffee-ground emesis, patient  is known to have history of esophagitis, does not appear to have any significant bleeding, will  monitor H&H, will start on Protonix drip, will have a clear liquid diet n.p.o. after midnight, if GI we'll plan for any procedure, will have GI consulted in a.m..  2. Esophagitis. Continue with Protonix, has no complaints of dysphagia or odynophagia  3. Depression. Continue home medication  4. Cerebral palsy, continue with supportive care  5. BPH. Continue her meds   DVT Prophylaxis SCDs   AM Labs Ordered, also please review Full Orders  Family Communication: Admission, patients condition and plan of care including tests being ordered have been discussed with the patient  who indicate understanding and agree with the plan and Code Status.  Code Status  full  Disposition Plan:   Time spent in minutes :  Condition GUARDED  Danyon Mcginness M.D on 06/12/2012 at 11:30 PM   [facility] Office  [phone]

## 2012-06-12 NOTE — ED Provider Notes (Signed)
Medical screening examination/treatment/procedure(s) were conducted as a shared visit with non-physician practitioner(s) and myself.  I personally evaluated the patient during the encounter  The patient presents to the emergency room with complaints of abdominal pain. He is noted to be tachycardic here in the emergency department. His lab tests do show a slight decrease in his hemoglobin associated with an elevated BUN.  His guaiac test was positive suggesting the possibility of an upper GI bleed. He will be admitted to the hospital for observation and serial hemoglobins  Celene Kras, MD 06/12/12 2119

## 2012-06-12 NOTE — ED Notes (Signed)
Patient denies chest pain or shortness of breath.  Heart rate with EMS below 100 currently 120. EKG taken

## 2012-06-12 NOTE — ED Notes (Signed)
Report given to floor nurse , transportesd in stable condition .

## 2012-06-12 NOTE — ED Notes (Signed)
EMS called for RUQ while eating 10 minutes prior to EMS arrival.  Pain 8/10 per EMS is wheelchair dependent for cerebral pals ey.

## 2012-06-12 NOTE — ED Provider Notes (Signed)
History     CSN: [id number]  Arrival date & time 06/12/12  1326   First MD Initiated Contact with Patient 06/12/12 1459      Chief Complaint  Patient presents with  . Abdominal Pain    (Consider location/radiation/quality/duration/timing/severity/associated sxs/prior treatment) The history is provided by the patient. The history is limited by a developmental delay.    61 y.o. male with PMH sig for cerebral palsy, seizure d/o  and MR c/o RUQ pain onset this AM, 10/10 lasting 2 hours resolved without intervention. Denies fever, N/V change in bowel or bladder habits. No prior similar episodes. Denies CP, SOB, HA.   Past Medical History  Diagnosis Date  . Seizures   . Cerebral palsy   . Hypertension   . Mental retardation   . Depression     History reviewed. No pertinent past surgical history.  No family history on file.  History  Substance Use Topics  . Smoking status: Never Smoker   . Smokeless tobacco: Not on file  . Alcohol Use: No      Review of Systems  Constitutional: Negative for fever.  Respiratory: Negative for shortness of breath.   Cardiovascular: Negative for chest pain.  Gastrointestinal: Positive for abdominal pain. Negative for nausea, vomiting and diarrhea.  Skin: Negative for pallor.  All other systems reviewed and are negative.    Allergies  Review of patient's allergies indicates no known allergies.  Home Medications   Current Outpatient Rx  Name Route Sig Dispense Refill  . AMITRIPTYLINE HCL 25 MG PO TABS Oral Take 25 mg by mouth at bedtime.     Marland Kitchen CLONIDINE HCL 0.2 MG PO TABS Oral Take 0.2 mg by mouth at bedtime.     . DUTASTERIDE-TAMSULOSIN HCL 0.5-0.4 MG PO CAPS Oral Take 1 tablet by mouth daily.    . FENOFIBRATE 160 MG PO TABS Oral Take 160 mg by mouth daily.     Marland Kitchen LACOSAMIDE 150 MG PO TABS Oral Take 150 mg by mouth 2 (two) times daily.    Marland Kitchen LORAZEPAM 1 MG PO TABS Oral Take 1 mg by mouth 3 (three) times daily.    . ABC PLUS SENIOR PO  TABS Oral Take 1 tablet by mouth daily.     Marland Kitchen PANTOPRAZOLE SODIUM 40 MG PO TBEC Oral Take 40 mg by mouth 2 (two) times daily.     Marland Kitchen PRAZOSIN HCL 2 MG PO CAPS Oral Take 2 mg by mouth at bedtime.       There were no vitals taken for this visit.  Physical Exam  Nursing note and vitals reviewed. Constitutional: He is oriented to person, place, and time. He appears well-developed and well-nourished. No distress.  HENT:  Head: Normocephalic and atraumatic.       Mucous membranes dry.   Eyes: Conjunctivae and EOM are normal.  Neck: Normal range of motion. No JVD present.  Cardiovascular:       Tachy to 130's a rest. No MRG  Pulmonary/Chest: Effort normal.  Abdominal: Soft. Bowel sounds are normal. He exhibits no distension and no mass. There is tenderness. There is no rebound and no guarding.       Mild TTP to RUQ and RLQ, no peritoneal signs. Murphy's negative  Musculoskeletal: Normal range of motion.       Wasting of bilateral LEs +DP  Neurological: He is alert and oriented to person, place, and time.  Psychiatric: He has a normal mood and affect.    ED Course  Procedures (including critical care time)  Labs Reviewed  COMPREHENSIVE METABOLIC PANEL - Abnormal; Notable for the following:    Glucose, Bld 113 (*)     BUN 49 (*)     Albumin 3.4 (*)     GFR calc non Af Amer 87 (*)     All other components within normal limits  CBC WITH DIFFERENTIAL - Abnormal; Notable for the following:    RBC 3.89 (*)     Hemoglobin 12.7 (*)     HCT 36.9 (*)     Neutrophils Relative 79 (*)     Neutro Abs 7.9 (*)     All other components within normal limits  URINALYSIS, ROUTINE W REFLEX MICROSCOPIC  LIPASE, BLOOD  POCT I-STAT TROPONIN I  OCCULT BLOOD, POC DEVICE  OCCULT BLOOD, POC DEVICE   Ct Abdomen Pelvis W Contrast  06/12/2012  *RADIOLOGY REPORT*  Clinical Data: Right-sided abdominal pain.  Tachycardia.  CT ABDOMEN AND PELVIS WITH CONTRAST  Technique:  Multidetector CT imaging of the abdomen  and pelvis was performed following the standard protocol during bolus administration of intravenous contrast.  Contrast: 80mL OMNIPAQUE IOHEXOL 300 MG/ML  SOLN  Comparison: 03/14/2010  Findings: Tiny right pleural effusion is noted.  There is incomplete visualization of a 1.9 cm nodular opacity in the right infrahilar region which could represent right hilar lymphadenopathy or pulmonary nodule.  A small hiatal hernia is again seen.  There is also diffuse wall thickening of the distal esophagus which is suspicious for esophagitis.  A 1.6 cm cyst in the lateral dome the liver is stable.  No liver masses are identified.  Gallbladder is unremarkable.  The stomach is distended by oral contrast but no obstructing mass or inflammatory process identified.  The spleen, pancreas, adrenal glands, and kidneys are normal in appearance.  No evidence of hydronephrosis.  No soft tissue masses or lymphadenopathy seen elsewhere within the abdomen or pelvis.  There is no evidence of inflammatory process or abnormal fluid collections.  No evidence of bowel wall thickening or dilatation.  IMPRESSION:  1.  Small hiatal hernia, with distal esophageal wall thickening suspicious for esophagitis.  Consider endoscopy for further evaluation. 2. Tiny right pleural effusion, with partial visualization of the nodular density in the right infrahilar region, which could represent right hilar lymphadenopathy or pulmonary nodule. Consider chest CT with contrast for further evaluation.   Original Report Authenticated By: Danae Orleans, M.D.    Dg Chest Port 1 View  06/12/2012  *RADIOLOGY REPORT*  Clinical Data: Right upper quadrant pain  PORTABLE CHEST - 1 VIEW  Comparison: 06/10/2012  Findings: Cardiomediastinal silhouette is stable.  No acute infiltrate or pleural effusion.  No pulmonary edema.  Bony thorax is stable.  IMPRESSION: No active disease.  No significant change.   Original Report Authenticated By: Natasha Mead, M.D.      Date:  06/12/2012  Rate: 117  Rhythm: sinus tachycardia  QRS Axis: left  Intervals: normal  ST/T Wave abnormalities: nonspecific ST/T changes  Conduction Disutrbances:nonspecific intraventricular conduction delay  Narrative Interpretation:   Old EKG Reviewed: none available   1. Tachycardia   2. Occult blood in stools   3. Elevated BUN       MDM  61 y.o. wheelchair bound man with MR c/o  2 hours of severe RUQ pain resolved spontaneously PTA.   Pt is Tachy to 130's at rest, I will rehydrate him as his MM look dry.   Troponin negative and EKG shows no changes.  I will obtain bloodwork, CXR and UA  Pt remains pain free, HR decreases to 120 after 1 L bolus. Abdominal exam remains Benign.   Hemoglobin is is 12.2 down from 12.9 2 weeks ago. We'll send off a guaiac test and order a CT of the abdomen/pelvis.   His Hemoccult test is positive and BUN is elevated to 49 increased from 23 x48 hrs. Ago. Considering his tachycardia I think it is reasonable to admit him for serial CBCs to rule out an upper GI bleed  Pt will be admitted under the care Dr. Cornell Barman Traid team ten to a telemetry bed    [facility], PA-C 06/12/12 2113

## 2012-06-12 NOTE — ED Notes (Signed)
Patient requested food [facility] ordered food.  Patient ate crackers. When warm food tray arrived patient refused.

## 2012-06-12 NOTE — ED Notes (Signed)
Patient given oral contrast by CT technologist.  

## 2012-06-13 ENCOUNTER — Encounter (HOSPITAL_COMMUNITY): Payer: Self-pay | Admitting: Physician Assistant

## 2012-06-13 DIAGNOSIS — K922 Gastrointestinal hemorrhage, unspecified: Secondary | ICD-10-CM | POA: Diagnosis present

## 2012-06-13 DIAGNOSIS — D649 Anemia, unspecified: Secondary | ICD-10-CM | POA: Diagnosis present

## 2012-06-13 DIAGNOSIS — G40909 Epilepsy, unspecified, not intractable, without status epilepticus: Secondary | ICD-10-CM | POA: Diagnosis present

## 2012-06-13 DIAGNOSIS — R195 Other fecal abnormalities: Secondary | ICD-10-CM

## 2012-06-13 LAB — CBC
HCT: 25.9 % — ABNORMAL LOW (ref 39.0–52.0)
Hemoglobin: 9 g/dL — ABNORMAL LOW (ref 13.0–17.0)
MCHC: 34.7 g/dL (ref 30.0–36.0)
MCV: 93.5 fL (ref 78.0–100.0)
RDW: 13.1 % (ref 11.5–15.5)

## 2012-06-13 LAB — BASIC METABOLIC PANEL
BUN: 31 mg/dL — ABNORMAL HIGH (ref 6–23)
CO2: 24 mEq/L (ref 19–32)
Chloride: 110 mEq/L (ref 96–112)
Creatinine, Ser: 0.98 mg/dL (ref 0.50–1.35)
Glucose, Bld: 81 mg/dL (ref 70–99)

## 2012-06-13 MED ORDER — PANTOPRAZOLE SODIUM 40 MG PO TBEC
40.0000 mg | DELAYED_RELEASE_TABLET | Freq: Two times a day (BID) | ORAL | Status: DC
  Administered 2012-06-13 – 2012-06-15 (×5): 40 mg via ORAL
  Filled 2012-06-13 (×5): qty 1

## 2012-06-13 MED ORDER — SODIUM CHLORIDE 0.9 % IV SOLN: INTRAVENOUS | Status: DC

## 2012-06-13 MED ORDER — PANTOPRAZOLE SODIUM 40 MG IV SOLR
40.0000 mg | INTRAVENOUS | Status: DC
  Filled 2012-06-13 (×2): qty 40

## 2012-06-13 MED ORDER — SUCRALFATE 1 GM/10ML PO SUSP
1.0000 g | Freq: Two times a day (BID) | ORAL | Status: DC
  Administered 2012-06-13 – 2012-06-15 (×6): 1 g via ORAL
  Filled 2012-06-13 (×7): qty 10

## 2012-06-13 MED ORDER — LACOSAMIDE 50 MG PO TABS
100.0000 mg | ORAL_TABLET | Freq: Every day | ORAL | Status: DC
  Administered 2012-06-13 – 2012-06-15 (×3): 100 mg via ORAL
  Filled 2012-06-13 (×3): qty 2

## 2012-06-13 NOTE — Progress Notes (Signed)
Pt is a poor historian and no visitor this time. Unable to complete admission history. Will review previous notes and pass along to day shift nurse. Pt's keys are in his belonging bag.

## 2012-06-13 NOTE — Consult Note (Signed)
[facility] Consult: 8:43 AM 06/13/2012   Referring Provider: Elgergawy Primary Care Physician:  Julian Hy, MD Primary Gastroenterologist:  Dr. Lina Sar  Reason for Consultation:  FOB positive stool  HPI: Colin Bradley is a 61 y.o. male.  Has cerebral palsy, mental retardation and seizure disorder.  Hx non-transfusion requiring GI bleed in 2009 with coffee ground emesis.  Presented to ED 9/6 c/o abd pain, unable to specify location, intensity, quality.  CT scan showed HH and esophagitis. Also evident is right hilar adenopathy vs pulmonary nodule.  Stool FOB positive.  No hx melena, BPR.  Hgb 12.7, was 13.7 on 9/4.   This AM Hgb dropped to 9.0.   BUN 49 compared to 23 two days ago.   This AM he has no abd pain or nausea.  He is wondering when he can have breakfast, kept NPO overnight in case of endoscopy. No BMs since arrival to hospital   EGD in 09/2008 showed erosive esophagitis, stricture at GE jx was not dilated.  No barretts on biopsy Colonoscopy 09/2010 with diverticulosis, internal hemorrhoids.  EGD 09/2010 with HH and esophagitis.  List of PTA meds includes BID Protonix, no ASA or NSAIDs.  Lipase and LFTs normal.  Current medical mgt is with Protonix 40 mg IV q 24 hours, clear diet.   Past Medical History  Diagnosis Date  . Seizures   . Cerebral palsy   . Hypertension   . Mental retardation   . Depression   . Nephrolithiasis   . BPH (benign prostatic hyperplasia)   . GI bleed 2009    coffee ground emesis, erosive esophagitis on EGD by Dr Juanda Chance  . Esophageal stricture 2009    not dilated during the EGD, biopsy benign:  no Barretts    Past Surgical History  Procedure Date  . Basal cell carcinoma excision     forehead  . Squamous cell carcinoma excision     lip    Prior to Admission medications   Medication Sig Start Date End Date Taking? Authorizing Provider  amitriptyline (ELAVIL) 25 MG tablet Take 25 mg  by mouth at bedtime.    Yes Historical Provider, MD  cloNIDine (CATAPRES) 0.2 MG tablet Take 0.2 mg by mouth at bedtime.    Yes Historical Provider, MD  Dutasteride-Tamsulosin HCl (JALYN) 0.5-0.4 MG CAPS Take 1 tablet by mouth daily.   Yes Historical Provider, MD  fenofibrate 160 MG tablet Take 160 mg by mouth daily.    Yes Historical Provider, MD  Lacosamide (VIMPAT) 100 MG TABS Take 1 tablet by mouth daily.   Yes Historical Provider, MD  LORazepam (ATIVAN) 1 MG tablet Take 1 mg by mouth 3 (three) times daily.   Yes Historical Provider, MD  pantoprazole (PROTONIX) 40 MG tablet Take 40 mg by mouth 2 (two) times daily.    Yes Historical Provider, MD  prazosin (MINIPRESS) 2 MG capsule Take 2 mg by mouth at bedtime.    Yes Historical Provider, MD  Multiple Vitamins-Minerals (ABC PLUS SENIOR) TABS Take 1 tablet by mouth daily.     Historical Provider, MD    Scheduled Meds:    . sodium chloride   Intravenous STAT  . amitriptyline  25 mg Oral QHS  . cloNIDine  0.2 mg Oral QHS  . fenofibrate  160 mg Oral Daily  . iohexol  20 mL Oral Q1 Hr x 2  . Lacosamide  1 tablet Oral Daily  . LORazepam  1 mg Oral TID  . multivitamin with  minerals  1 tablet Oral Daily  . pantoprazole (PROTONIX) IV  40 mg Intravenous Q24H  . sodium chloride  1,000 mL Intravenous Once  . sodium chloride  1,000 mL Intravenous Once   Infusions:    . sodium chloride 75 mL/hr at 06/12/12 2335   PRN Meds: HYDROcodone-acetaminophen, iohexol, ondansetron (ZOFRAN) IV, ondansetron (ZOFRAN) IV, ondansetron, DISCONTD:  morphine injection, DISCONTD: ondansetron (ZOFRAN) IV, DISCONTD: ondansetron   Allergies as of 06/12/2012  . (No Known Allergies)    No family history on file.  History   Social History  . Marital Status: Single    Spouse Name: N/A    Number of Children: N/A  . Years of Education: N/A   Occupational History  . Not on file.   Social History Main Topics  . Smoking status: Never Smoker   . Smokeless  tobacco: Not on file  . Alcohol Use: No  . Drug Use: No  . Sexually Active:    Other Topics Concern  . Not on file   Social History Narrative  . No narrative on file    REVIEW OF SYSTEMS: Eating well at home.  No belly pain at present mobilizes with power chair No headaches.  No blurry vision. "I'm a healthy guy" No joint pain.  No sadness.  No rash, sores or itching   PHYSICAL EXAM: Vital signs in last 24 hours: Temp:  [98 F (36.7 C)-98.4 F (36.9 C)] 98.4 F (36.9 C) (09/07 0620) Pulse Rate:  [102-124] 102  (09/07 0620) Resp:  [12-20] 16  (09/07 0620) BP: (108-157)/(61-90) 108/61 mmHg (09/07 0620) SpO2:  [95 %-100 %] 98 % (09/07 0620) Weight:  [111 lb 8.8 oz (50.6 kg)] 111 lb 8.8 oz (50.6 kg) (09/06 2156)  General: looks well.  Looks young for age Head:  No swelling or asymmetry  Eyes:  No icterus or pallor Ears:  Not HOH  Nose:  No discharge Mouth:  Dry MM.  No sores.  Lips chapped Neck:  No mass or JVD Lungs:  Clear.  No SOB Heart: RRR.  No MRG Abdomen:  Soft, NT, ND.  No HSM.  No mass or bruits.   Rectal: deferred   Musc/Skeltl: no joint swellilng.  Some contractures of limbs: mild Extremities:  Right greater than left non pitting pedal edema  Neurologic:  Oriented to self and hospital.  Moves all 4 limbs but limited strength and mobility of legs >>> than arms.   Skin:  No rash or sores Tattoos:  none Nodes:  No cervical or inguinal adenopathy   Psych:  Pleasant, cooperative.  Not agitated.   Intake/Output from previous day: 09/06 0701 - 09/07 0700 In: 240 [P.O.:240] Out: 425 [Urine:425] Intake/Output this shift:    LAB RESULTS:  Basename 06/13/12 0646 06/12/12 1714 06/10/12 1428  WBC 5.6 10.0 6.6  HGB 9.0* 12.7* 13.7  HCT 25.9* 36.9* 39.6  PLT 188 264 285  MCV           93   BMET Lab Results  Component Value Date   NA 141 06/13/2012   NA 143 06/12/2012   NA 139 06/10/2012   K 3.5 06/13/2012   K 3.7 06/12/2012   K 3.6 06/10/2012   CL 110  06/13/2012   CL 106 06/12/2012   CL 102 06/10/2012   CO2 24 06/13/2012   CO2 26 06/12/2012   CO2 26 06/10/2012   GLUCOSE 81 06/13/2012   GLUCOSE 113* 06/12/2012   GLUCOSE 119* 06/10/2012   BUN  31* 06/13/2012   BUN 49* 06/12/2012   BUN 23 06/10/2012   CREATININE 0.98 06/13/2012   CREATININE 0.98 06/12/2012   CREATININE 1.15 06/10/2012   CALCIUM 8.7 06/13/2012   CALCIUM 10.2 06/12/2012   CALCIUM 9.8 06/10/2012   LFT  Basename 06/12/12 1510 06/10/12 1428  PROT 6.6 7.0  ALBUMIN 3.4* 3.6  AST 24 23  ALT 11 12  ALKPHOS 45 55  BILITOT 0.7 0.6  BILIDIR -- --  IBILI -- --  Lipase                               32   PT/INR None assayed.     RADIOLOGY STUDIES: Ct Abdomen Pelvis W Contrast 06/12/2012 Contrast: 80mL OMNIPAQUE IOHEXOL 300 MG/ML  SOLN  Comparison: 03/14/2010  Findings: Tiny right pleural effusion is noted.  There is incomplete visualization of a 1.9 cm nodular opacity in the right infrahilar region which could represent right hilar lymphadenopathy or pulmonary nodule.  A small hiatal hernia is again seen.  There is also diffuse wall thickening of the distal esophagus which is suspicious for esophagitis.  A 1.6 cm cyst in the lateral dome the liver is stable.  No liver masses are identified.  Gallbladder is unremarkable.  The stomach is distended by oral contrast but no obstructing mass or inflammatory process identified.  The spleen, pancreas, adrenal glands, and kidneys are normal in appearance.  No evidence of hydronephrosis.  No soft tissue masses or lymphadenopathy seen elsewhere within the abdomen or pelvis.  There is no evidence of inflammatory process or abnormal fluid collections.  No evidence of bowel wall thickening or dilatation.  IMPRESSION:  1.  Small hiatal hernia, with distal esophageal wall thickening suspicious for esophagitis.  Consider endoscopy for further evaluation. 2. Tiny right pleural effusion, with partial visualization of the nodular density in the right infrahilar region, which could  represent right hilar lymphadenopathy or pulmonary nodule. Consider chest CT with contrast for further evaluation.   Original Report Authenticated By: Danae Orleans, M.D.    Dg Chest Port 1 View 06/12/2012  *RADIOLOGY REPORT*  Clinical Data: Right upper quadrant pain  PORTABLE CHEST - 1 VIEW  Comparison: 06/10/2012  Findings: Cardiomediastinal silhouette is stable.  No acute infiltrate or pleural effusion.  No pulmonary edema.  Bony thorax is stable.  IMPRESSION: No active disease.  No significant change.   Original Report Authenticated By: Natasha Mead, M.D.     ENDOSCOPIC STUDIES: See HPI  IMPRESSION: *  Abdominal pain, resolved and non-recurrent *  FOB positive stool.  Hx of erosive esophagitis in past on 2 different EGDs, this is likely the source of current issue. .  *  Hx esoph stricture, no dysphagia.  *  Anemia, ABL *  Mental retardation, CP, seizure d/o   PLAN: *  Twice daily PPI, oral ok.  Make sure he is taking it 20 to 30 mins before meal in AM and PM (could take last dose at night time before bed).  Will add BID Carafate.  No EGD.  Needs to have ROV with Dr Juanda Chance in about 4 weeks.  *  Will feed the patient *  Due for H and h at noon today.   LOS: 1 day   Jennye Moccasin  06/13/2012, 8:43 AM Pager: [phone]      ________________________________________________________________________  [facility] MD note:  I personally examined the patient, reviewed the data. Repeat H/H shows  Hb is 9.  Pretty impressive drop, not sure if very much is dilutional.  No OVERT bleeding per nursing.  Will plan on EGD tomorrow AM.  Clears only today.   Rob Bunting, MD [facility] Pager [phone]

## 2012-06-13 NOTE — Progress Notes (Addendum)
Subjective: Feels a bit better. No abdominal pain or nausea. Mostly empty ginger ale at bedside. No CP or SOB. Seems calm   Objective: Vital signs in last 24 hours: Temp:  [97.6 F (36.4 C)-98.4 F (36.9 C)] 97.6 F (36.4 C) (09/07 1550) Pulse Rate:  [88-124] 88  (09/07 1550) Resp:  [12-20] 14  (09/07 1550) BP: (108-148)/(61-78) 135/67 mmHg (09/07 1550) SpO2:  [95 %-100 %] 99 % (09/07 1550) Weight:  [50.6 kg (111 lb 8.8 oz)] 50.6 kg (111 lb 8.8 oz) (09/06 2156)  Intake/Output from previous day: 09/06 0701 - 09/07 0700 In: 240 [P.O.:240] Out: 425 [Urine:425] Intake/Output this shift: Total I/O In: 360 [P.O.:360] Out: 550 [Urine:550]  General: alert, no distress. Face symmetric, neck supple. Lungs clear. Ht regular. abd soft NT, nondistended. Good BS's. Awake. Mentation at baseline. No tremor  Lab Results   Basename 06/13/12 1201 06/13/12 0646 06/12/12 1714  WBC -- 5.6 10.0  RBC -- 2.77* 3.89*  HGB 9.0* 9.0* --  HCT 26.1* 25.9* --  MCV -- 93.5 94.9  MCH -- 32.5 32.6  RDW -- 13.1 13.2  PLT -- 188 264    Basename 06/13/12 0646 06/12/12 1510  NA 141 143  K 3.5 3.7  CL 110 106  CO2 24 26  GLUCOSE 81 113*  BUN 31* 49*  CREATININE 0.98 0.98  CALCIUM 8.7 10.2    Studies/Results: Ct Abdomen Pelvis W Contrast  06/12/2012  *RADIOLOGY REPORT*  Clinical Data: Right-sided abdominal pain.  Tachycardia.  CT ABDOMEN AND PELVIS WITH CONTRAST  Technique:  Multidetector CT imaging of the abdomen and pelvis was performed following the standard protocol during bolus administration of intravenous contrast.  Contrast: 80mL OMNIPAQUE IOHEXOL 300 MG/ML  SOLN  Comparison: 03/14/2010  Findings: Tiny right pleural effusion is noted.  There is incomplete visualization of a 1.9 cm nodular opacity in the right infrahilar region which could represent right hilar lymphadenopathy or pulmonary nodule.  A small hiatal hernia is again seen.  There is also diffuse wall thickening of the distal esophagus  which is suspicious for esophagitis.  A 1.6 cm cyst in the lateral dome the liver is stable.  No liver masses are identified.  Gallbladder is unremarkable.  The stomach is distended by oral contrast but no obstructing mass or inflammatory process identified.  The spleen, pancreas, adrenal glands, and kidneys are normal in appearance.  No evidence of hydronephrosis.  No soft tissue masses or lymphadenopathy seen elsewhere within the abdomen or pelvis.  There is no evidence of inflammatory process or abnormal fluid collections.  No evidence of bowel wall thickening or dilatation.  IMPRESSION:  1.  Small hiatal hernia, with distal esophageal wall thickening suspicious for esophagitis.  Consider endoscopy for further evaluation. 2. Tiny right pleural effusion, with partial visualization of the nodular density in the right infrahilar region, which could represent right hilar lymphadenopathy or pulmonary nodule. Consider chest CT with contrast for further evaluation.   Original Report Authenticated By: Danae Orleans, M.D.    Dg Chest Port 1 View  06/12/2012  *RADIOLOGY REPORT*  Clinical Data: Right upper quadrant pain  PORTABLE CHEST - 1 VIEW  Comparison: 06/10/2012  Findings: Cardiomediastinal silhouette is stable.  No acute infiltrate or pleural effusion.  No pulmonary edema.  Bony thorax is stable.  IMPRESSION: No active disease.  No significant change.   Original Report Authenticated By: Natasha Mead, M.D.     Scheduled Meds:   . sodium chloride   Intravenous STAT  .  amitriptyline  25 mg Oral QHS  . cloNIDine  0.2 mg Oral QHS  . fenofibrate  160 mg Oral Daily  . iohexol  20 mL Oral Q1 Hr x 2  . lacosamide  100 mg Oral Daily  . LORazepam  1 mg Oral TID  . multivitamin with minerals  1 tablet Oral Daily  . pantoprazole  40 mg Oral BID AC  . sodium chloride  1,000 mL Intravenous Once  . sucralfate  1 g Oral BID  . DISCONTD: heparin  5,000 Units Subcutaneous Q8H  . DISCONTD: Lacosamide  1 tablet Oral Daily   . DISCONTD: pantoprazole (PROTONIX) IV  80 mg Intravenous STAT  . DISCONTD: pantoprazole (PROTONIX) IV  40 mg Intravenous Q24H  . DISCONTD: sodium chloride  1,000 mL Intravenous Once   Continuous Infusions:   . sodium chloride 20 mL/hr (06/13/12 1046)  . sodium chloride    . DISCONTD: sodium chloride 125 mL/hr at 06/12/12 2104  . DISCONTD: pantoprozole (PROTONIX) infusion 8 mg/hr (06/12/12 2341)  . DISCONTD: pantoprozole (PROTONIX) infusion Stopped (06/13/12 0746)   PRN Meds:HYDROcodone-acetaminophen, iohexol, ondansetron (ZOFRAN) IV, ondansetron (ZOFRAN) IV, ondansetron, DISCONTD:  morphine injection, DISCONTD: ondansetron (ZOFRAN) IV, DISCONTD: ondansetron  Assessment/Plan: Patient Active Problem List . Heme positive stool/GI Bleed: doing better, hemodynamically stable. Hgb still at 9.   planned investigation by GI in AM    . Anemia    Diagnosis  . DEPRESSION, CHRONIC: seems OK  . MENTAL RETARDATION: at baseline  . 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 ESOPHAGITIS: likely the source of bleeding  .    .   . BENIGN PROSTATIC HYPERTROPHY, HX OF: on Rx SEIZURE DISORDER: on vimpat/lacosamide            LOS: 1 day   Khalid Lacko ALAN 06/13/2012, 4:32 PM

## 2012-06-13 NOTE — Progress Notes (Signed)
H&P appreciated. Patients PCP is Dr. Evlyn Kanner which was confirmed with patients father. Discussed with Dr. Evlyn Kanner who knows patient and has kindly accepted care onto his service. The [facility] service will sign off at this time and are available if further assistance is needed. D/w patients nurse, said to be stable, GI has consulted and are getting an H&H at noon. This MD has not seen the patient.  Telly Jawad 9:30 AM

## 2012-06-14 ENCOUNTER — Encounter (HOSPITAL_COMMUNITY)
Admission: EM | Disposition: A | Discharge status: Nursing Home (Includes ICF) | Payer: Self-pay | Source: Home / Self Care | Attending: Endocrinology

## 2012-06-14 ENCOUNTER — Encounter (HOSPITAL_COMMUNITY): Payer: Self-pay

## 2012-06-14 DIAGNOSIS — K449 Diaphragmatic hernia without obstruction or gangrene: Secondary | ICD-10-CM

## 2012-06-14 HISTORY — PX: ESOPHAGOGASTRODUODENOSCOPY: SHX5428

## 2012-06-14 LAB — BASIC METABOLIC PANEL
BUN: 21 mg/dL (ref 6–23)
CO2: 26 mEq/L (ref 19–32)
Chloride: 108 mEq/L (ref 96–112)
Creatinine, Ser: 0.92 mg/dL (ref 0.50–1.35)
Glucose, Bld: 89 mg/dL (ref 70–99)
Potassium: 3.1 mEq/L — ABNORMAL LOW (ref 3.5–5.1)

## 2012-06-14 LAB — CBC
HCT: 26.9 % — ABNORMAL LOW (ref 39.0–52.0)
Hemoglobin: 9.2 g/dL — ABNORMAL LOW (ref 13.0–17.0)
MCV: 92.8 fL (ref 78.0–100.0)
RDW: 13 % (ref 11.5–15.5)
WBC: 3.8 10*3/uL — ABNORMAL LOW (ref 4.0–10.5)

## 2012-06-14 SURGERY — EGD (ESOPHAGOGASTRODUODENOSCOPY)
Anesthesia: Moderate Sedation

## 2012-06-14 MED ORDER — FENTANYL CITRATE 0.05 MG/ML IJ SOLN
INTRAMUSCULAR | Status: DC | PRN
  Administered 2012-06-14 (×4): 25 ug via INTRAVENOUS

## 2012-06-14 MED ORDER — BUTAMBEN-TETRACAINE-BENZOCAINE 2-2-14 % EX AERO
INHALATION_SPRAY | CUTANEOUS | Status: DC | PRN
  Administered 2012-06-14: 2 via TOPICAL

## 2012-06-14 MED ORDER — MIDAZOLAM HCL 5 MG/ML IJ SOLN
INTRAMUSCULAR | Status: AC
  Filled 2012-06-14: qty 2

## 2012-06-14 MED ORDER — FENTANYL CITRATE 0.05 MG/ML IJ SOLN
INTRAMUSCULAR | Status: AC
  Filled 2012-06-14: qty 2

## 2012-06-14 MED ORDER — DIPHENHYDRAMINE HCL 50 MG/ML IJ SOLN
INTRAMUSCULAR | Status: AC
  Filled 2012-06-14: qty 1

## 2012-06-14 MED ORDER — MIDAZOLAM HCL 10 MG/2ML IJ SOLN
INTRAMUSCULAR | Status: DC | PRN
  Administered 2012-06-14 (×4): 2 mg via INTRAVENOUS

## 2012-06-14 MED ORDER — POTASSIUM CHLORIDE 20 MEQ/15ML (10%) PO LIQD
20.0000 meq | Freq: Every day | ORAL | Status: DC
  Administered 2012-06-14 – 2012-06-15 (×2): 20 meq via ORAL
  Filled 2012-06-14 (×2): qty 15

## 2012-06-14 NOTE — Op Note (Signed)
[facility] [address] [place], [address]   ENDOSCOPY PROCEDURE REPORT  PATIENT: Colin Bradley, Colin Bradley  MR#: [id number] BIRTHDATE: [birth year] , 60  yrs. old GENDER: Male ENDOSCOPIST: Rachael Fee, MD PROCEDURE DATE:  06/14/2012 PROCEDURE:  EGD, diagnostic ASA CLASS:     Class III INDICATIONS:  heme + stool, anemia (Hb 9, was 13 several weeks ago); no overt GI bleeding; . Dr. Juanda Chance EGD in 09/2008 showed erosive esophagitis, stricture at GE jx was not dilated. No barretts on biopsy;  [facility] Colonoscopy 09/2010 with diverticulosis, internal hemorrhoids. [facility] EGD 09/2010 with HH and esophagitis. MEDICATIONS: Fentanyl 100 mcg IV and Versed 8 mg IV TOPICAL ANESTHETIC: Cetacaine Spray  DESCRIPTION OF PROCEDURE: After the risks benefits and alternatives of the procedure were thoroughly explained, informed consent was obtained.  The Pentax Gastroscope [id number] endoscope was introduced through the mouth and advanced to the second portion of the duodenum. Without limitations.  The instrument was slowly withdrawn as the mucosa was fully examined.     There was mild, linear erosive esophagitis (reflux related) and a small 3cm hiatal hernia.  There was no old or recent blood in his stomach.  The examination was otherwise normal.  Retroflexed views revealed no abnormalities.     The scope was then withdrawn from the patient and the procedure completed. COMPLICATIONS: There were no complications.  ENDOSCOPIC IMPRESSION: Mild erosive, reflux related esophagitis and relatively small hiatal hernia.  No new or old blood in his stomach.  RECOMMENDATIONS: He has had no overt bleeding.  Since colonoscopy 2 years ago by Dr. Juanda Chance found no signficant lesions, I don't think that needs to be repeated now.  Ok to discharge from GI perspective, follow clinically for overt bleeding, repeat CBD in 4 weeks.    eSigned:  Rachael Fee, MD 06/14/2012 10:42  AM

## 2012-06-14 NOTE — Plan of Care (Signed)
Problem: Phase III Progression Outcomes Goal: Pain controlled on oral analgesia Outcome: Completed/Met Date Met:  06/14/12 Pain well controled with meds. Pain at patients tolerable level. Will continue to monitor and intervene if necessary.    Goal: Activity at appropriate level-compared to baseline (UP IN CHAIR FOR HEMODIALYSIS)  Outcome: Not Progressing Persistent contractures. Protecting skin prone to pressure sores. Repositioning the pt. Goal: Voiding independently Outcome: Completed/Met Date Met:  06/14/12 Uses urinal to void. adequate urine. Will continue to monitor. Goal: IV/normal saline lock discontinued Outcome: Progressing Pt not drinking adequate po fluid. ON iv with KVO. Will encourage to drink water.

## 2012-06-14 NOTE — Progress Notes (Signed)
Subjective: Did OK with procedure. K is noted to be low Will see if he can eat OK  HGB looks pretty good BP a bit soft at times   Objective: Vital signs in last 24 hours: Temp:  [97 F (36.1 C)-98 F (36.7 C)] 98 F (36.7 C) (09/08 1000) Pulse Rate:  [78-101] 78  (09/08 0442) Resp:  [10-78] 78  (09/08 1100) BP: (101-139)/(41-80) 116/66 mmHg (09/08 1100) SpO2:  [95 %-100 %] 100 % (09/08 1100) Weight:  [50.5 kg (111 lb 5.3 oz)] 50.5 kg (111 lb 5.3 oz) (09/08 0442)  Intake/Output from previous day: 09/07 0701 - 09/08 0700 In: 360 [P.O.:360] Out: 1250 [Urine:1250] Intake/Output this shift:    General: alertno distress. Face symmetric, neck supple. Lungs clear. Ht regular. abd soft NT, nondistended. Good BS's. Awake. Mentation at baseline. No tremor   Lab Results   Basename 06/14/12 0730 06/13/12 1201 06/13/12 0646  WBC 3.8* -- 5.6  RBC 2.90* -- 2.77*  HGB 9.2* 9.0* --  HCT 26.9* 26.1* --  MCV 92.8 -- 93.5  MCH 31.7 -- 32.5  RDW 13.0 -- 13.1  PLT 196 -- 188    Basename 06/14/12 0730 06/13/12 0646  NA 142 141  K 3.1* 3.5  CL 108 110  CO2 26 24  GLUCOSE 89 81  BUN 21 31*  CREATININE 0.92 0.98  CALCIUM 8.9 8.7    Studies/Results: Ct Abdomen Pelvis W Contrast  06/12/2012  *RADIOLOGY REPORT*  Clinical Data: Right-sided abdominal pain.  Tachycardia.  CT ABDOMEN AND PELVIS WITH CONTRAST  Technique:  Multidetector CT imaging of the abdomen and pelvis was performed following the standard protocol during bolus administration of intravenous contrast.  Contrast: 80mL OMNIPAQUE IOHEXOL 300 MG/ML  SOLN  Comparison: 03/14/2010  Findings: Tiny right pleural effusion is noted.  There is incomplete visualization of a 1.9 cm nodular opacity in the right infrahilar region which could represent right hilar lymphadenopathy or pulmonary nodule.  A small hiatal hernia is again seen.  There is also diffuse wall thickening of the distal esophagus which is suspicious for esophagitis.  A 1.6 cm  cyst in the lateral dome the liver is stable.  No liver masses are identified.  Gallbladder is unremarkable.  The stomach is distended by oral contrast but no obstructing mass or inflammatory process identified.  The spleen, pancreas, adrenal glands, and kidneys are normal in appearance.  No evidence of hydronephrosis.  No soft tissue masses or lymphadenopathy seen elsewhere within the abdomen or pelvis.  There is no evidence of inflammatory process or abnormal fluid collections.  No evidence of bowel wall thickening or dilatation.  IMPRESSION:  1.  Small hiatal hernia, with distal esophageal wall thickening suspicious for esophagitis.  Consider endoscopy for further evaluation. 2. Tiny right pleural effusion, with partial visualization of the nodular density in the right infrahilar region, which could represent right hilar lymphadenopathy or pulmonary nodule. Consider chest CT with contrast for further evaluation.   Original Report Authenticated By: Danae Orleans, M.D.    Dg Chest Port 1 View  06/12/2012  *RADIOLOGY REPORT*  Clinical Data: Right upper quadrant pain  PORTABLE CHEST - 1 VIEW  Comparison: 06/10/2012  Findings: Cardiomediastinal silhouette is stable.  No acute infiltrate or pleural effusion.  No pulmonary edema.  Bony thorax is stable.  IMPRESSION: No active disease.  No significant change.   Original Report Authenticated By: Natasha Mead, M.D.     Scheduled Meds:   . amitriptyline  25 mg Oral QHS  .  cloNIDine  0.2 mg Oral QHS  . fenofibrate  160 mg Oral Daily  . lacosamide  100 mg Oral Daily  . LORazepam  1 mg Oral TID  . multivitamin with minerals  1 tablet Oral Daily  . pantoprazole  40 mg Oral BID AC  . sucralfate  1 g Oral BID  . DISCONTD: Lacosamide  1 tablet Oral Daily   Continuous Infusions:   . sodium chloride 20 mL/hr (06/13/12 1046)  . DISCONTD: sodium chloride     PRN Meds:HYDROcodone-acetaminophen, ondansetron (ZOFRAN) IV, ondansetron, DISCONTD:  butamben-tetracaine-benzocaine, DISCONTD: fentaNYL, DISCONTD: midazolam  Assessment/Plan:   Heme positive stool/GI Bleed: doing better, hemodynamically stable. Hgb still at 9.esophagitis found, advance diet, replace K, mobilize  .  Anemia   Diagnosis  .  DEPRESSION, CHRONIC: seems OK  .  MENTAL RETARDATION: at baseline  .  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  ESOPHAGITIS: likely the source of bleeding  .  .  . BENIGN PROSTATIC HYPERTROPHY, HX OF: on Rx  SEIZURE DISORDER: on vimpat/lacosamide     LOS: 2 days   Kristena Wilhelmi ALAN 06/14/2012, 11:32 AM

## 2012-06-14 NOTE — Interval H&P Note (Signed)
History and Physical Interval Note:  06/14/2012 10:09 AM  Colin Bradley  has presented today for surgery, with the diagnosis of esopahgitis, anemia, fob + stool  The various methods of treatment have been discussed with the patient and family. After consideration of risks, benefits and other options for treatment, the patient has consented to  Procedure(s) (LRB) with comments: ESOPHAGOGASTRODUODENOSCOPY (EGD) (N/A) as a surgical intervention .  The patient's history has been reviewed, patient examined, no change in status, stable for surgery.  I have reviewed the patient's chart and labs.  Questions were answered to the patient's satisfaction.     Rob Bunting

## 2012-06-15 ENCOUNTER — Encounter (HOSPITAL_COMMUNITY): Payer: Self-pay | Admitting: Gastroenterology

## 2012-06-15 LAB — BASIC METABOLIC PANEL
BUN: 19 mg/dL (ref 6–23)
Calcium: 9 mg/dL (ref 8.4–10.5)
Creatinine, Ser: 0.94 mg/dL (ref 0.50–1.35)
GFR calc Af Amer: >90 mL/min (ref >90)
GFR calc non Af Amer: 88 mL/min — ABNORMAL LOW (ref >90)
Glucose, Bld: 110 mg/dL — ABNORMAL HIGH (ref 70–99)
Potassium: 3.5 mEq/L (ref 3.5–5.1)

## 2012-06-15 LAB — CBC
HCT: 26.6 % — ABNORMAL LOW (ref 39.0–52.0)
Hemoglobin: 9.1 g/dL — ABNORMAL LOW (ref 13.0–17.0)
MCH: 31.9 pg (ref 26.0–34.0)
MCHC: 34.2 g/dL (ref 30.0–36.0)
MCV: 93.3 fL (ref 78.0–100.0)
RDW: 13 % (ref 11.5–15.5)

## 2012-06-15 MED ORDER — POLYSACCHARIDE IRON COMPLEX 150 MG PO CAPS
150.0000 mg | ORAL_CAPSULE | Freq: Every day | ORAL | Status: DC
  Administered 2012-06-15: 150 mg via ORAL
  Filled 2012-06-15: qty 1

## 2012-06-15 MED ORDER — POLYSACCHARIDE IRON COMPLEX 150 MG PO CAPS: 150.0000 mg | ORAL_CAPSULE | Freq: Every day | ORAL | Status: DC

## 2012-06-15 MED ORDER — SUCRALFATE 1 GM/10ML PO SUSP: 1.0000 g | Freq: Two times a day (BID) | ORAL | Status: DC

## 2012-06-15 NOTE — Progress Notes (Signed)
CSW received a call from Charge RN re: pt and signature for d/c paperwork.  After consulting with CSW Chiropodist, CSW explained that pt can sign for himself since he has full capacity.  Though his signature, due to his physical needs may not look the same as we are used to as long as he can make a "mark" that "signature" will be taken.  CSW also advised Consulting civil engineer to make a note under his signature of the RN who witnessed the signature.    Charge RN also consulted CSW for transportation back to pt's independent living (apartment).  CSW will arrange this and assist further as needed. Vickii Penna, LCSWA [phone]  Clinical Social Work

## 2012-06-15 NOTE — Plan of Care (Signed)
Problem: Phase III Progression Outcomes Goal: Activity at appropriate level-compared to baseline (UP IN CHAIR FOR HEMODIALYSIS)  Outcome: Not Progressing Pt mobile within bed. Needs repositioning q 2 hrs. Towel applied between knees. Stage 1 sacral ulcer. Applied mepilex. Goal: IV/normal saline lock discontinued Outcome: Completed/Met Date Met:  06/15/12 Pt not drinking adequate po fluids. Will encourage to take PO. Goal: Discharge plan remains appropriate-arrangements made Outcome: Completed/Met Date Met:  06/15/12 Pt to be DC'd to a SNF. Will coordinate.

## 2012-06-15 NOTE — Progress Notes (Signed)
Talked with Dr. Evlyn Kanner.  Patient states can sign discharge papers as he has done in the past and return to prior home - [employer] per ambulance.

## 2012-06-15 NOTE — Clinical Documentation Improvement (Signed)
GENERIC DOCUMENTATION CLARIFICATION QUERY  THIS DOCUMENT IS NOT A PERMANENT PART OF THE MEDICAL RECORD  TO RESPOND TO THE THIS QUERY, FOLLOW THE INSTRUCTIONS BELOW:  1. If needed, update documentation for the patient's encounter via the notes activity.  2. Access this query again and click edit on the In [employer].  3. After updating, or not, click F2 to complete all highlighted (required) fields concerning your review. Select "additional documentation in the medical record" OR "no additional documentation provided".  4. Click Sign note button.  5. The deficiency will fall out of your In Basket *Please let us know if you are not able to complete this workflow by phone or e-mail (listed below).  Please update your documentation within the medical record to reflect your response to this query.                                                                                        06/15/12   Dear [facility],  In a better effort to capture your patient's severity of illness, reflect appropriate length of stay and utilization of resources, a review of the patient medical record has revealed the following indicators.    Based on your clinical judgment, please clarify and document in a progress note and/or discharge summary the clinical condition associated with the following supporting information:  In responding to this query please exercise your independent judgment.  The fact that a query is asked, does not imply that any particular answer is desired or expected.   Possible Clinical Conditions?  _______Hypokalemia   _______Other Condition  _______Cannot Clinically Determine     Risk Factors: K is noted to be low; replace K, noted per 9/8 progress notes.  Diagnostics: 9/8: potassium: 3.1 9/7: potassium: 3.5  Treatment: 9/8: kcl po daily.   You may use possible, probable, or suspect with inpatient documentation. possible, probable, suspected diagnoses MUST  be documented at the time of discharge  Reviewed:   Hypokalemia documented per 9/9 discharge summary.  Thank You,  Marciano Sequin,  Clinical Documentation Specialist:  Pager: [phone]  Health Information Management [facility]

## 2012-06-15 NOTE — Discharge Summary (Signed)
DISCHARGE SUMMARY  Colin Bradley  MR#: [id number]  DOB:Nov 29, 1950  Date of Admission: 06/12/2012 Date of Discharge: 06/15/2012  Attending Physician:Lovella Hardie ALAN  Patient's ION:[id number] Colin Diener, MD  Consults:    Discharge Diagnoses: Principal Problem:  *Esophagitis, stable Active Problems:  DEPRESSION, CHRONIC  CEREBRAL PALSY   BENIGN PROSTATIC HYPERTROPHY, HX OF  Heme positive stool  Anemia. With HGB 9.2  Seizure disorder, stable  Hiatal hernia Hypokalemia, replaced   Discharge Medications: Medication List  As of 06/15/2012 12:42 PM   TAKE these medications         ABC Plus Senior Tabs   Take 1 tablet by mouth daily.      amitriptyline 25 MG tablet   Commonly known as: ELAVIL   Take 25 mg by mouth at bedtime.      cloNIDine 0.2 MG tablet   Commonly known as: CATAPRES   Take 0.2 mg by mouth at bedtime.      fenofibrate 160 MG tablet   Take 160 mg by mouth daily.      JALYN 0.5-0.4 MG Caps   Generic drug: Dutasteride-Tamsulosin HCl   Take 1 tablet by mouth daily.      LORazepam 1 MG tablet   Commonly known as: ATIVAN   Take 1 mg by mouth 3 (three) times daily.      pantoprazole 40 MG tablet   Commonly known as: PROTONIX   Take 40 mg by mouth 2 (two) times daily.      prazosin 2 MG capsule   Commonly known as: MINIPRESS   Take 2 mg by mouth at bedtime.      sucralfate 1 GM/10ML suspension   Commonly known as: CARAFATE   Take 10 mLs (1 g total) by mouth 2 (two) times daily.      VIMPAT 100 MG Tabs   Generic drug: Lacosamide   Take 1 tablet by mouth daily. NU-IRON 150 MG ONCE DAILY            Hospital Procedures: Dg Chest 2 View  05/20/2012  *RADIOLOGY REPORT*  Clinical Data: Seizure, hypertension  CHEST - 2 VIEW  Comparison: 03/27/2012  Findings: Cardiomediastinal silhouette is stable.  No acute infiltrate or pleural effusion.  No pulmonary edema.  Bony thorax is stable.  IMPRESSION: No active disease.  Original Report Authenticated By:  Natasha Mead, M.D.   Dg Ankle Complete Right  06/07/2012  *RADIOLOGY REPORT*  Clinical Data: Right ankle pain post fall  RIGHT ANKLE - COMPLETE 3+ VIEW  Comparison: None  Findings: Soft tissue swelling greatest laterally. Ankle mortise intact. Question mild osseous demineralization. No ankle fracture or dislocation identified. Fracture identified at base of fifth metatarsal. No additional focal osseous abnormalities identified.  IMPRESSION: Minimally distracted fracture at base of right fifth metatarsal.   Original Report Authenticated By: Lollie Marrow, M.D.    Ct Abdomen Pelvis W Contrast  06/12/2012  *RADIOLOGY REPORT*  Clinical Data: Right-sided abdominal pain.  Tachycardia.  CT ABDOMEN AND PELVIS WITH CONTRAST  Technique:  Multidetector CT imaging of the abdomen and pelvis was performed following the standard protocol during bolus administration of intravenous contrast.  Contrast: 80mL OMNIPAQUE IOHEXOL 300 MG/ML  SOLN  Comparison: 03/14/2010  Findings: Tiny right pleural effusion is noted.  There is incomplete visualization of a 1.9 cm nodular opacity in the right infrahilar region which could represent right hilar lymphadenopathy or pulmonary nodule.  A small hiatal hernia is again seen.  There is also diffuse wall thickening of the distal esophagus  which is suspicious for esophagitis.  A 1.6 cm cyst in the lateral dome the liver is stable.  No liver masses are identified.  Gallbladder is unremarkable.  The stomach is distended by oral contrast but no obstructing mass or inflammatory process identified.  The spleen, pancreas, adrenal glands, and kidneys are normal in appearance.  No evidence of hydronephrosis.  No soft tissue masses or lymphadenopathy seen elsewhere within the abdomen or pelvis.  There is no evidence of inflammatory process or abnormal fluid collections.  No evidence of bowel wall thickening or dilatation.  IMPRESSION:  1.  Small hiatal hernia, with distal esophageal wall thickening  suspicious for esophagitis.  Consider endoscopy for further evaluation. 2. Tiny right pleural effusion, with partial visualization of the nodular density in the right infrahilar region, which could represent right hilar lymphadenopathy or pulmonary nodule. Consider chest CT with contrast for further evaluation.   Original Report Authenticated By: Danae Orleans, M.D.    Dg Chest Port 1 View  06/12/2012  *RADIOLOGY REPORT*  Clinical Data: Right upper quadrant pain  PORTABLE CHEST - 1 VIEW  Comparison: 06/10/2012  Findings: Cardiomediastinal silhouette is stable.  No acute infiltrate or pleural effusion.  No pulmonary edema.  Bony thorax is stable.  IMPRESSION: No active disease.  No significant change.   Original Report Authenticated By: Natasha Mead, M.D.    Dg Chest Port 1 View  06/10/2012  *RADIOLOGY REPORT*  Clinical Data: Cough.  PORTABLE CHEST - 1 VIEW  Comparison: 05/20/2012  Findings: The lungs are clear without focal infiltrate, edema, pneumothorax or pleural effusion. Interstitial markings are diffusely coarsened with chronic features. Cardiopericardial silhouette is at upper limits of normal for size. Bones are diffusely demineralized. Telemetry leads overlie the chest.  IMPRESSION: Stable.  No acute findings.   Original Report Authenticated By: ERIC A. MANSELL, M.D.    Dg Foot Complete Right  06/07/2012  *RADIOLOGY REPORT*  Clinical Data: Right foot pain post fall  RIGHT FOOT COMPLETE - 3+ VIEW  Comparison: None  Findings: Soft tissue swelling greatest at dorsum of foot. Bones appear demineralized. Minimally distracted fracture identified at base of right fifth metatarsal. Distortion on AP view due to foot/ankle flexion. Nondiagnostic oblique view. No additional fracture, dislocation, or bone destruction identified.  IMPRESSION: Distracted fracture at base of right fifth metatarsal.   Original Report Authenticated By: Lollie Marrow, M.D.     History of Present Illness: abd pain and  anemia  Hospital Course: Colin Bradley with prior esophagitis, presented with abd pain and anemia. Treated with fluids, never needed transfusion. Seen by GI and had endoscopy yesterday with findings of esophagitis but no acute bleeding. Hgb and vital signs have been stable. Diet has been advanced and the pt is feeling better. Will d/c back to his group home setting. Now on both PPI and carafate and now iron has been added for his blood loss anemia. No flares of his seizure disorder were noted.  Day of Discharge Exam BP 113/52  Pulse 94  Temp 98 F (36.7 C) (Oral)  Resp 18  Wt 50.5 kg (111 lb 5.3 oz)  SpO2 100%  Physical Exam: General appearance: alert, sitting up in no distress. Face symmetric Eyes: no scleral icterus Throat: oropharynx moist without erythema Resp: clear to auscultation bilaterally, no wheezes or rales Cardio: regular rate and rhythm GI: soft, non-tender; bowel sounds normal; no masses,  no organomegaly, nondistended Extremities: no clubbing, cyanosis or edema Neuro: mentation at baseline, not overly anxious, speech  clear, no tremor Skin: no telangiectasia or rashes  Discharge Labs:  [facility] 06/15/12 0545 06/14/12 0730  NA 142 142  K 3.5 3.1*  CL 109 108  CO2 27 26  GLUCOSE 110* 89  BUN 19 21  CREATININE 0.94 0.92  CALCIUM 9.0 8.9  MG -- --  PHOS -- --    Basename 06/12/12 1510  AST 24  ALT 11  ALKPHOS 45  BILITOT 0.7  PROT 6.6  ALBUMIN 3.4*    Basename 06/15/12 0545 06/14/12 0730 06/12/12 1714  WBC 5.1 3.8* --  NEUTROABS -- -- 7.9*  HGB 9.1* 9.2* --  HCT 26.6* 26.9* --  MCV 93.3 92.8 --  PLT 210 196 --                 ENDOSCOPIC IMPRESSION:  Mild erosive, reflux related esophagitis and relatively small hiatal  hernia. No new or old blood in his stomach.  RECOMMENDATIONS:  He has had no overt bleeding. Since colonoscopy 2 years ago by Dr.  Juanda Chance found no signficant lesions, I don't think that needs to be  repeated now. Ok to discharge  from GI perspective, follow  clinically for overt bleeding, repeat CBD in 4 weeks.   Discharge instructions:   Disposition: back to group home  Follow-up Appts: Follow-up with Dr. Evlyn Kanner at [facility] in 2 weeks  Call for appointment.  Condition on Discharge: improved  Tests Needing Follow-up: none  Signed: Nigel Ericsson ALAN 06/15/2012, 12:42 PM

## 2012-06-15 NOTE — Progress Notes (Addendum)
06/15/2012 5:32 PM Nursing note Attempted several times to call caregiver Marcelino Duster regarding pt. Discharge and need to contact [place] his nurse regarding new prescriptions that needed to be picked up. No response or call back. Spoke with Lucia Gaskins Nurse Case Manager regarding this, she states she will follow up with Pt. Caregiver Marcelino Duster in am 9/10 regarding new prescriptions to be picked up. Dina RN, updated on this as well as patient.  Colin Bradley, Blanchard Kelch

## 2012-06-21 ENCOUNTER — Encounter (HOSPITAL_COMMUNITY): Payer: Self-pay | Admitting: *Deleted

## 2012-06-21 ENCOUNTER — Emergency Department (HOSPITAL_COMMUNITY)
Admission: EM | Admit: 2012-06-21 | Discharge: 2012-06-22 | Disposition: A | Discharge status: Home / Self Care | Payer: Medicare Other | Source: Home / Self Care

## 2012-06-21 DIAGNOSIS — M542 Cervicalgia: Secondary | ICD-10-CM | POA: Insufficient documentation

## 2012-06-21 DIAGNOSIS — F411 Generalized anxiety disorder: Secondary | ICD-10-CM | POA: Insufficient documentation

## 2012-06-21 DIAGNOSIS — G809 Cerebral palsy, unspecified: Secondary | ICD-10-CM | POA: Insufficient documentation

## 2012-06-21 DIAGNOSIS — F79 Unspecified intellectual disabilities: Secondary | ICD-10-CM | POA: Insufficient documentation

## 2012-06-21 DIAGNOSIS — I10 Essential (primary) hypertension: Secondary | ICD-10-CM | POA: Insufficient documentation

## 2012-06-21 DIAGNOSIS — F329 Major depressive disorder, single episode, unspecified: Secondary | ICD-10-CM | POA: Insufficient documentation

## 2012-06-21 DIAGNOSIS — F3289 Other specified depressive episodes: Secondary | ICD-10-CM | POA: Insufficient documentation

## 2012-06-21 DIAGNOSIS — N400 Enlarged prostate without lower urinary tract symptoms: Secondary | ICD-10-CM | POA: Insufficient documentation

## 2012-06-21 MED ORDER — ACETAMINOPHEN 325 MG PO TABS
650.0000 mg | ORAL_TABLET | Freq: Once | ORAL | Status: AC
  Administered 2012-06-21: 650 mg via ORAL
  Filled 2012-06-21: qty 2

## 2012-06-21 NOTE — ED Provider Notes (Signed)
History     CSN: [id number]  Arrival date & time 06/21/12  2222   First MD Initiated Contact with Patient 06/21/12 2235      Chief Complaint  Patient presents with  . Neck Pain    (Consider location/radiation/quality/duration/timing/severity/associated sxs/prior treatment) HPI History provided by pt who is a poor historian.  Pt has h/o CP.  Presents w/ c/o posterior neck pain that started this evening when he was getting into bed and right after taking his medications.  He hears a crackling when he turns his head.  Has had this for several nights.  No associated fever or upper extremity weakness/paresthesias.  His roommate gave him a pink pill that seemed to help with the pain.  Denies trauma but has been cleaning his apartment this week.  Per prior chart, pt admitted this month for esophagitis, abd pain and heme pos stool w/ anemia.    Past Medical History  Diagnosis Date  . Seizures   . Cerebral palsy   . Hypertension   . Mental retardation   . Depression   . Nephrolithiasis   . BPH (benign prostatic hyperplasia)   . GI bleed 2009    coffee ground emesis, erosive esophagitis on EGD by Dr Juanda Chance  . Esophageal stricture 2009    not dilated during the EGD, biopsy benign:  no Barretts  . Depression with anxiety     Past Surgical History  Procedure Date  . Basal cell carcinoma excision 2002    forehead  . Squamous cell carcinoma excision 2004    lip  . Bowen dz 2002    right cheek   . Esophagogastroduodenoscopy 06/14/2012    Procedure: ESOPHAGOGASTRODUODENOSCOPY (EGD);  Surgeon: Rachael Fee, MD;  Location: [facility] ENDOSCOPY;  Service: Endoscopy;  Laterality: N/A;    No family history on file.  History  Substance Use Topics  . Smoking status: Never Smoker   . Smokeless tobacco: Not on file  . Alcohol Use: No      Review of Systems  All other systems reviewed and are negative.    Allergies  Review of patient's allergies indicates no known allergies.  Home  Medications   Current Outpatient Rx  Name Route Sig Dispense Refill  . AMITRIPTYLINE HCL 25 MG PO TABS Oral Take 25 mg by mouth at bedtime.     Marland Kitchen CLONIDINE HCL 0.2 MG PO TABS Oral Take 0.2 mg by mouth at bedtime.     . DUTASTERIDE-TAMSULOSIN HCL 0.5-0.4 MG PO CAPS Oral Take 1 tablet by mouth daily.    . FENOFIBRATE 160 MG PO TABS Oral Take 160 mg by mouth daily.     Marland Kitchen POLYSACCHARIDE IRON COMPLEX 150 MG PO CAPS Oral Take 1 capsule (150 mg total) by mouth daily. 30 capsule 2  . LACOSAMIDE 100 MG PO TABS Oral Take 1 tablet by mouth daily.    Marland Kitchen LORAZEPAM 1 MG PO TABS Oral Take 1 mg by mouth 3 (three) times daily.    . ABC PLUS SENIOR PO TABS Oral Take 1 tablet by mouth daily.     Marland Kitchen PANTOPRAZOLE SODIUM 40 MG PO TBEC Oral Take 40 mg by mouth 2 (two) times daily.     Marland Kitchen PRAZOSIN HCL 2 MG PO CAPS Oral Take 2 mg by mouth at bedtime.     . SUCRALFATE 1 GM/10ML PO SUSP Oral Take 10 mLs (1 g total) by mouth 2 (two) times daily. 420 mL 1    BP 171/81  Pulse 99  Temp 97.7 F (36.5 C) (Oral)  Resp 20  SpO2 100%  Physical Exam  Nursing note and vitals reviewed. Constitutional: He is oriented to person, place, and time. He appears well-developed and well-nourished. No distress.  HENT:  Head: Normocephalic and atraumatic.  Eyes:       Normal appearance  Neck: Normal range of motion.  Cardiovascular: Normal rate and regular rhythm.        hypertensive  Pulmonary/Chest: Effort normal and breath sounds normal. No respiratory distress.  Musculoskeletal: Normal range of motion.       No tenderness of neck, including cervical spine.  Pt does not appear uncomfortable w/ active ROM.  Full active ROM of upper extremities.  5/5 upper extremity strength.  2+ radial pulses and distal sensation intact.   Neurological: He is alert and oriented to person, place, and time.  Skin: Skin is warm and dry. No rash noted.  Psychiatric: He has a normal mood and affect. His behavior is normal.    ED Course    Procedures (including critical care time)  Labs Reviewed - No data to display No results found.   1. Neck pain       MDM  Pt presents w/ c/o non-traumatic neck pain.  H/o CP and is a poor historian.  No signs of neck trauma or infectious process on exam and NV intact.  He rotating head and moving all extremities w/ ease.  Will treat pain and reassess shortly.   Pt received po tylenol w/ relief of pain.  VSS.  D/c'd home w/ recommendation to take tylenol, rest and apply heat/ice and f/u with his PCP. He has a home Geneticist, molecular.  IInstructed to return if he develops fever or pain worsens.  He appears to understand.          Arie Sabina [place], [place] 06/22/12 1820

## 2012-06-21 NOTE — ED Notes (Signed)
PT reports his neck started one hour tonight. Pt denies any injury to neck. Pt reports he is out of pain meds.

## 2012-06-22 ENCOUNTER — Emergency Department (HOSPITAL_COMMUNITY): Payer: Medicare Other

## 2012-06-22 ENCOUNTER — Emergency Department (HOSPITAL_COMMUNITY)
Admission: EM | Admit: 2012-06-22 | Discharge: 2012-06-23 | Disposition: A | Discharge status: Home / Self Care | Payer: Medicare Other | Attending: Emergency Medicine | Admitting: Emergency Medicine

## 2012-06-22 ENCOUNTER — Emergency Department (HOSPITAL_COMMUNITY)
Admission: EM | Admit: 2012-06-22 | Discharge: 2012-06-22 | Disposition: A | Discharge status: Home / Self Care | Payer: Medicare Other | Attending: Emergency Medicine | Admitting: Emergency Medicine

## 2012-06-22 ENCOUNTER — Encounter (HOSPITAL_COMMUNITY): Payer: Self-pay

## 2012-06-22 ENCOUNTER — Encounter (HOSPITAL_COMMUNITY): Payer: Self-pay | Admitting: *Deleted

## 2012-06-22 DIAGNOSIS — R569 Unspecified convulsions: Secondary | ICD-10-CM

## 2012-06-22 DIAGNOSIS — F3289 Other specified depressive episodes: Secondary | ICD-10-CM | POA: Insufficient documentation

## 2012-06-22 DIAGNOSIS — F411 Generalized anxiety disorder: Secondary | ICD-10-CM | POA: Insufficient documentation

## 2012-06-22 DIAGNOSIS — F419 Anxiety disorder, unspecified: Secondary | ICD-10-CM

## 2012-06-22 DIAGNOSIS — N400 Enlarged prostate without lower urinary tract symptoms: Secondary | ICD-10-CM | POA: Insufficient documentation

## 2012-06-22 DIAGNOSIS — I10 Essential (primary) hypertension: Secondary | ICD-10-CM | POA: Insufficient documentation

## 2012-06-22 DIAGNOSIS — F79 Unspecified intellectual disabilities: Secondary | ICD-10-CM | POA: Insufficient documentation

## 2012-06-22 DIAGNOSIS — E876 Hypokalemia: Secondary | ICD-10-CM

## 2012-06-22 DIAGNOSIS — G40909 Epilepsy, unspecified, not intractable, without status epilepticus: Secondary | ICD-10-CM

## 2012-06-22 DIAGNOSIS — F329 Major depressive disorder, single episode, unspecified: Secondary | ICD-10-CM | POA: Insufficient documentation

## 2012-06-22 DIAGNOSIS — G809 Cerebral palsy, unspecified: Secondary | ICD-10-CM | POA: Insufficient documentation

## 2012-06-22 LAB — BASIC METABOLIC PANEL
BUN: 13 mg/dL (ref 6–23)
Calcium: 9.3 mg/dL (ref 8.4–10.5)
Creatinine, Ser: 0.97 mg/dL (ref 0.50–1.35)
GFR calc Af Amer: >90 mL/min (ref >90)
GFR calc non Af Amer: 87 mL/min — ABNORMAL LOW (ref >90)

## 2012-06-22 LAB — URINALYSIS, ROUTINE W REFLEX MICROSCOPIC
Bilirubin Urine: NEGATIVE
Glucose, UA: NEGATIVE mg/dL
Hgb urine dipstick: NEGATIVE
Ketones, ur: NEGATIVE mg/dL
Leukocytes, UA: NEGATIVE
Nitrite: NEGATIVE
Protein, ur: NEGATIVE mg/dL
Specific Gravity, Urine: 1.013 (ref 1.005–1.030)
Urobilinogen, UA: 0.2 mg/dL (ref 0.0–1.0)
pH: 7 (ref 5.0–8.0)

## 2012-06-22 LAB — BASIC METABOLIC PANEL WITH GFR
CO2: 24 meq/L (ref 19–32)
Chloride: 101 meq/L (ref 96–112)
Glucose, Bld: 101 mg/dL — ABNORMAL HIGH (ref 70–99)
Potassium: 3.1 meq/L — ABNORMAL LOW (ref 3.5–5.1)
Sodium: 138 meq/L (ref 135–145)

## 2012-06-22 MED ORDER — LORAZEPAM 1 MG PO TABS
1.0000 mg | ORAL_TABLET | Freq: Once | ORAL | Status: AC
  Administered 2012-06-22: 1 mg via ORAL
  Filled 2012-06-22: qty 1

## 2012-06-22 MED ORDER — POTASSIUM CHLORIDE 20 MEQ/15ML (10%) PO LIQD
40.0000 meq | Freq: Once | ORAL | Status: AC
  Administered 2012-06-22: 40 meq via ORAL
  Filled 2012-06-22: qty 30

## 2012-06-22 NOTE — ED Notes (Signed)
[facility] here to transport pt home 

## 2012-06-22 NOTE — Discharge Instructions (Signed)
Seizure, Adult A seizure is when the body shakes uncontrollably (convulsion). It can be a scary experience. A seizure is not a diagnosis. It is a sign that something else may be wrong with brain and/or spinal cord (central nervous system). In the Emergency Department, your condition is evaluated. The seizure is then treated. You will likely need follow-up with your caregiver. You will possibly need further testing and evaluation. Your caregiver or the specialist to whom you are referred will determine if further treatment is needed. After a seizure, you may be confused, dazed and drowsy. These problems (symptoms) often follow a seizure. Medication given to treat the seizure may also cause some of these changes. The time following a seizure is known as a refractory period. Hospital admission is seldom required unless there are other conditions present such as trauma or metabolic problems. Sometimes the seizure activity follows a fainting episode. This may have been caused by a brief drop in blood pressure. These fainting (syncopal) seizures are generally not a cause for concern.  HOME CARE INSTRUCTIONS   Follow up with your caregiver as suggested.   If any problems happen, get help right away.   Do not swim or drive until your caregiver says it is okay.  Document Released: 09/20/2000 Document Revised: 09/12/2011 Document Reviewed: 09/11/2011 [employer] Patient Information 2012 [employer]. 

## 2012-06-22 NOTE — ED Notes (Signed)
The pt fell from his w/c earlier today and has been seen here earlier today for the same.  He came back in by gems.  Cerebral palsy

## 2012-06-22 NOTE — ED Provider Notes (Signed)
History     CSN: [id number]  Arrival date & time 06/22/12  9528   First MD Initiated Contact with Patient 06/22/12 [phone]      Chief Complaint  Patient presents with  . Anxiety  . Seizures    (Consider location/radiation/quality/duration/timing/severity/associated sxs/prior treatment) HPI Comments: Pt has a roommate, possibly at a group home based on prior records, has known seizure disorder, takes ativan daily for anxiety, reportedly had a seizure this AM.  Pt has stopped, is complaining of feeling anxious.  He denies nausea or HA currently, no neck pain.  He reports has had a mild cough productive of white sputum.  No fevers, chills.  Denies CP, abd pain.  He continues to report he is scared and nervous.  Level 5 caveat due to MR and CP.  The history is provided by the patient, medical records and the EMS personnel.    Past Medical History  Diagnosis Date  . Seizures   . Cerebral palsy   . Hypertension   . Mental retardation   . Depression   . Nephrolithiasis   . BPH (benign prostatic hyperplasia)   . GI bleed 2009    coffee ground emesis, erosive esophagitis on EGD by Dr Juanda Chance  . Esophageal stricture 2009    not dilated during the EGD, biopsy benign:  no Barretts  . Depression with anxiety     Past Surgical History  Procedure Date  . Basal cell carcinoma excision 2002    forehead  . Squamous cell carcinoma excision 2004    lip  . Bowen dz 2002    right cheek   . Esophagogastroduodenoscopy 06/14/2012    Procedure: ESOPHAGOGASTRODUODENOSCOPY (EGD);  Surgeon: Rachael Fee, MD;  Location: [facility] ENDOSCOPY;  Service: Endoscopy;  Laterality: N/A;    History reviewed. No pertinent family history.  History  Substance Use Topics  . Smoking status: Never Smoker   . Smokeless tobacco: Not on file  . Alcohol Use: No      Review of Systems  Unable to perform ROS: Other    Allergies  Review of patient's allergies indicates no known allergies.  Home Medications    Current Outpatient Rx  Name Route Sig Dispense Refill  . AMITRIPTYLINE HCL 25 MG PO TABS Oral Take 25 mg by mouth at bedtime.     Marland Kitchen CLONIDINE HCL 0.2 MG PO TABS Oral Take 0.2 mg by mouth at bedtime.     . DUTASTERIDE-TAMSULOSIN HCL 0.5-0.4 MG PO CAPS Oral Take 1 tablet by mouth daily.    . FENOFIBRATE 160 MG PO TABS Oral Take 160 mg by mouth daily.     Marland Kitchen POLYSACCHARIDE IRON COMPLEX 150 MG PO CAPS Oral Take 1 capsule (150 mg total) by mouth daily. 30 capsule 2  . LACOSAMIDE 100 MG PO TABS Oral Take 1 tablet by mouth daily.    Marland Kitchen LORAZEPAM 1 MG PO TABS Oral Take 1 mg by mouth 3 (three) times daily.    . ABC PLUS SENIOR PO TABS Oral Take 1 tablet by mouth daily.     Marland Kitchen PANTOPRAZOLE SODIUM 40 MG PO TBEC Oral Take 40 mg by mouth 2 (two) times daily.     Marland Kitchen PRAZOSIN HCL 2 MG PO CAPS Oral Take 2 mg by mouth at bedtime.     . SUCRALFATE 1 GM/10ML PO SUSP Oral Take 10 mLs (1 g total) by mouth 2 (two) times daily. 420 mL 1    BP 158/87  Pulse 109  Temp 97.4  F (36.3 C)  Resp 16  SpO2 100%  Physical Exam  Nursing note and vitals reviewed. Constitutional: He appears well-developed and well-nourished. No distress.  HENT:  Head: Normocephalic and atraumatic.  Eyes: EOM are normal. Pupils are equal, round, and reactive to light.  Neck: Normal range of motion. Neck supple.  Cardiovascular: Normal rate.   Pulmonary/Chest: Effort normal. No respiratory distress. He has no rales.  Abdominal: Soft. He exhibits no distension. There is no tenderness.  Neurological: He is alert. He displays tremor. No sensory deficit. He exhibits abnormal muscle tone. Coordination abnormal.       Moves both arms some spontaneously, will grip with both hands when asked.  Tracks, communicates with me in direct fashion.  Redirectable.    Skin: Skin is warm. No rash noted. He is not diaphoretic.  Psychiatric: His mood appears anxious. His affect is not angry and not inappropriate. His speech is delayed. His speech is not  slurred. He is not agitated. He exhibits abnormal recent memory and abnormal remote memory.    ED Course  Procedures (including critical care time)  Labs Reviewed  BASIC METABOLIC PANEL - Abnormal; Notable for the following:    Potassium 3.1 (*)     Glucose, Bld 101 (*)     GFR calc non Af Amer 87 (*)     All other components within normal limits  URINALYSIS, ROUTINE W REFLEX MICROSCOPIC   Dg Chest Port 1 View  06/22/2012  *RADIOLOGY REPORT*  Clinical Data: Seizure and cough.  PORTABLE CHEST - 1 VIEW  Comparison: One view chest 06/12/2012.  Findings: The heart size is normal.  The patient is rotated to the right.  The lungs are clear.  The visualized soft tissues and bony thorax are unremarkable.  IMPRESSION: Negative chest.   Original Report Authenticated By: Jamesetta Orleans. MATTERN, M.D.      1. Seizure   2. Anxiety   3. Hypokalemia     ECG at time 0805 shows sinus tachycardia at rate 110, RBBB, normal axis, no ST or T wave abn's.  No sig change from ECG on 06/12/12.    10:05 AM K+ is marginally low at 3.1.  Will give some oral replacement here.  Pt can follow up with PCP this week regarding all symptoms.      12:33 PM UA is ok.  Pt has eaten, less anxious, HR is improved to 90.  Will d/c home.     MDM  Pt probably back to baseline, awaiting caregiver to arrive.  Pt no longer seizing, mild tachycardia is chronic based on prior notes.  Pt seen yesterday for neck pain.  Will give ativan here.  Neck is not stiff or rigid.  No rash.  Will check CXR and electrolytes here.          Gavin Pound. Jatavia Keltner, MD 06/22/12 1235

## 2012-06-22 NOTE — ED Notes (Addendum)
Per EMS, pt with Cerebral Palsy from home with a roommate that has CP and a caretaker.  Pt states his caretaker was not there yet this morning.  While taking BP, pt had a seizure.  Thayer Ohm, [place] notified of pt's seizure.  Pt initially postictal, now alert and responsive.  No injuries noted to pt.  Pt with hx of seizures.

## 2012-06-22 NOTE — ED Notes (Signed)
Called pt's contact Arvil Chaco who stated he is unable to assist with pt.  He gave the caretaker's phone number.  Called that number and spoke with Marcelino Duster.  She stated that she is on the way there and will be there when pt arrives.  [facility] called for transport.

## 2012-06-22 NOTE — ED Notes (Signed)
The pt has hte same pain on his forehead and  Knee rt.  No swelling etc./.  He wants to stay a night in the hosp

## 2012-06-23 NOTE — ED Notes (Signed)
PT ambulated with baseline gait; VSS; A&Ox3; no signs of distress; respirations even and unlabored; skin warm and dry; no questions upon discharge.  

## 2012-06-23 NOTE — ED Notes (Signed)
[facility] called to transport pt. To home 

## 2012-06-23 NOTE — ED Provider Notes (Signed)
History     CSN: [id number]  Arrival date & time 06/22/12  1907   First MD Initiated Contact with Patient 06/22/12 2109      Chief Complaint  Patient presents with  . Fall    (Consider location/radiation/quality/duration/timing/severity/associated sxs/prior treatment) HPI  This patient is a 61 year old man with cerebral palsy, mental retardation and a history of seizure disorder post mood disorder. Chart review shows that he is seen in this emergency department rather frequently. In fact, this is his third visit to this emergency department in the past 36 hours. He was discharged yesterday after having been seen by Dr. Oletta Lamas with report of questionable seizure and fall from [facility].  The patient says he is here tonight because he felt like he might have a seizure. He told his remade this and has roommate hit his medical alert button. EMS responded to the scene and transported the patient to the emergency department. The patient denies actually experiencing a seizure this evening. He denies any fall subsequent to his discharge yesterday. He is without any other complaints. From chart review, it does not appear that the patient is on a daily antiepileptic. The patient is unable to name his medications for me to is not accompanied by family or friends. My attempt to reach his roommate by phone was unsuccessful  Past Medical History  Diagnosis Date  . Seizures   . Cerebral palsy   . Hypertension   . Mental retardation   . Depression   . Nephrolithiasis   . BPH (benign prostatic hyperplasia)   . GI bleed 2009    coffee ground emesis, erosive esophagitis on EGD by Dr Juanda Chance  . Esophageal stricture 2009    not dilated during the EGD, biopsy benign:  no Barretts  . Depression with anxiety     Past Surgical History  Procedure Date  . Basal cell carcinoma excision 2002    forehead  . Squamous cell carcinoma excision 2004    lip  . Bowen dz 2002    right cheek   .  Esophagogastroduodenoscopy 06/14/2012    Procedure: ESOPHAGOGASTRODUODENOSCOPY (EGD);  Surgeon: Rachael Fee, MD;  Location: [facility] ENDOSCOPY;  Service: Endoscopy;  Laterality: N/A;    No family history on file.  History  Substance Use Topics  . Smoking status: Never Smoker   . Smokeless tobacco: Not on file  . Alcohol Use: No      Review of Systems Limited secondary to MR.   Denies fever.  Denies chest pain Denies SOB, cough Denies chest pain.  Denies dysuria, hematuria Denies abdominal pain, nausea, vomiting, diarrhea.  Denies extremity pain.   Allergies  Review of patient's allergies indicates no known allergies.  Home Medications   Current Outpatient Rx  Name Route Sig Dispense Refill  . AMITRIPTYLINE HCL 25 MG PO TABS Oral Take 25 mg by mouth at bedtime.     Marland Kitchen CLONIDINE HCL 0.2 MG PO TABS Oral Take 0.2 mg by mouth at bedtime.     . DUTASTERIDE-TAMSULOSIN HCL 0.5-0.4 MG PO CAPS Oral Take 1 tablet by mouth daily.    . FENOFIBRATE 160 MG PO TABS Oral Take 160 mg by mouth 2 (two) times daily.     Marland Kitchen POLYSACCHARIDE IRON COMPLEX 150 MG PO CAPS Oral Take 150 mg by mouth 2 (two) times daily.    Marland Kitchen LACOSAMIDE 150 MG PO TABS Oral Take 150 mg by mouth 2 (two) times daily.    Marland Kitchen LORAZEPAM 1 MG PO TABS Oral  Take 1 mg by mouth 3 (three) times daily.    . ADULT MULTIVITAMIN W/MINERALS CH Oral Take 1 tablet by mouth daily.    Marland Kitchen PANTOPRAZOLE SODIUM 40 MG PO TBEC Oral Take 40 mg by mouth 2 (two) times daily.     Marland Kitchen PRAZOSIN HCL 2 MG PO CAPS Oral Take 2 mg by mouth at bedtime.     . SUCRALFATE 1 GM/10ML PO SUSP Oral Take 1 g by mouth 2 (two) times daily.    . TRIAMCINOLONE 0.1 % CREAM:EUCERIN CREAM 1:1 Topical Apply 1 application topically 2 (two) times daily as needed. For itching      BP 177/94  Pulse 116  Temp 98.1 F (36.7 C) (Oral)  Resp 19  SpO2 100%  Physical Exam  Gen: Chronically ill appearing, no acute distress. Alert and oriented to self and place. Head: NCAT, no signs  of trauma Eyes: PERL, EOMI Nose: no epistaixis or rhinorrhea Mouth/throat: mucosa is moist and pink Neck: supple, no stridor, no C-spine tenderness Lungs: CTA B, no wheezing, rhonchi or rales Abd: soft, notender, nondistended Back: Significant kyphosis is present, there also appears to be mild scoliosis, no tenderness to palpation. Skin: no rashese, wnl Neuro: CN ii-xii grossly intact, pt can move all 4 extremities but clearly has poor motor control of his LE - this is a chronic condition.  Psyche; normal affect,  calm and cooperative, poor insight.   ED Course  Procedures (including critical care time)  Labs Reviewed - No data to display Dg Chest [facility] 1 View  06/22/2012  *RADIOLOGY REPORT*  Clinical Data: Seizure and cough.  PORTABLE CHEST - 1 VIEW  Comparison: One view chest 06/12/2012.  Findings: The heart size is normal.  The patient is rotated to the right.  The lungs are clear.  The visualized soft tissues and bony thorax are unremarkable.  IMPRESSION: Negative chest.   Original Report Authenticated By: Jamesetta Orleans. MATTERN, M.D.      1. Cerebral palsy   2. Seizure disorder   3. Hypertension       MDM  ED work up is non-diagnostic. The patient had U/A and chemistries performed < 24 hrs ago and there is no indication to repeat.  I have counseled the patient on the importance of outpatient follow up with his primary care physician.  The patient does not know if he has a neurologist. He will be transported back to his residence by [facility] as he is non-ambulatory.   Level 5 caveat secondary to CP and MR        Brandt Loosen, MD 06/23/12 [phone]

## 2012-06-23 NOTE — ED Notes (Signed)
[facility] called  

## 2012-06-28 ENCOUNTER — Emergency Department (HOSPITAL_COMMUNITY): Payer: Medicare Other

## 2012-06-28 ENCOUNTER — Emergency Department (HOSPITAL_COMMUNITY)
Admission: EM | Admit: 2012-06-28 | Discharge: 2012-06-28 | Disposition: A | Discharge status: Home / Self Care | Payer: Medicare Other | Attending: Emergency Medicine | Admitting: Emergency Medicine

## 2012-06-28 ENCOUNTER — Encounter (HOSPITAL_COMMUNITY): Payer: Self-pay | Admitting: Emergency Medicine

## 2012-06-28 DIAGNOSIS — W19XXXA Unspecified fall, initial encounter: Secondary | ICD-10-CM

## 2012-06-28 DIAGNOSIS — M25559 Pain in unspecified hip: Secondary | ICD-10-CM | POA: Insufficient documentation

## 2012-06-28 DIAGNOSIS — M549 Dorsalgia, unspecified: Secondary | ICD-10-CM | POA: Insufficient documentation

## 2012-06-28 DIAGNOSIS — G809 Cerebral palsy, unspecified: Secondary | ICD-10-CM | POA: Insufficient documentation

## 2012-06-28 DIAGNOSIS — W050XXA Fall from non-moving wheelchair, initial encounter: Secondary | ICD-10-CM | POA: Insufficient documentation

## 2012-06-28 DIAGNOSIS — Z79899 Other long term (current) drug therapy: Secondary | ICD-10-CM | POA: Insufficient documentation

## 2012-06-28 DIAGNOSIS — M25551 Pain in right hip: Secondary | ICD-10-CM

## 2012-06-28 DIAGNOSIS — S39012A Strain of muscle, fascia and tendon of lower back, initial encounter: Secondary | ICD-10-CM

## 2012-06-28 DIAGNOSIS — I10 Essential (primary) hypertension: Secondary | ICD-10-CM | POA: Insufficient documentation

## 2012-06-28 DIAGNOSIS — S335XXA Sprain of ligaments of lumbar spine, initial encounter: Secondary | ICD-10-CM | POA: Insufficient documentation

## 2012-06-28 NOTE — ED Notes (Signed)
Pt denies hitting head 

## 2012-06-28 NOTE — ED Provider Notes (Signed)
History     CSN: [id number]  Arrival date & time 06/28/12  Colin Bradley   First MD Initiated Contact with Patient 06/28/12 1828      Chief Complaint  Patient presents with  . Fall    (Consider location/radiation/quality/duration/timing/severity/associated sxs/prior treatment) HPI Comments: [name] is a 61 y.o. Male with hs of Cp, metal retardation, wheelchair bound. Here for an episode of a fall. Pt states he was transferring from a chair into a bed when he slipped off and fell down. States landing on the back. States pain in the lower back and right leg. Denies hitting his head, denies LOC. Hx of frequent falls in the past.    Past Medical History  Diagnosis Date  . Seizures   . Cerebral palsy   . Hypertension   . Mental retardation   . Depression   . Nephrolithiasis   . BPH (benign prostatic hyperplasia)   . GI bleed 2009    coffee ground emesis, erosive esophagitis on EGD by Dr Juanda Chance  . Esophageal stricture 2009    not dilated during the EGD, biopsy benign:  no Barretts  . Depression with anxiety     Past Surgical History  Procedure Date  . Basal cell carcinoma excision 2002    forehead  . Squamous cell carcinoma excision 2004    lip  . Bowen dz 2002    right cheek   . Esophagogastroduodenoscopy 06/14/2012    Procedure: ESOPHAGOGASTRODUODENOSCOPY (EGD);  Surgeon: Rachael Fee, MD;  Location: [facility] ENDOSCOPY;  Service: Endoscopy;  Laterality: N/A;    No family history on file.  History  Substance Use Topics  . Smoking status: Never Smoker   . Smokeless tobacco: Not on file  . Alcohol Use: No      Review of Systems  Constitutional: Negative for fever and chills.  HENT: Negative for neck pain and neck stiffness.   Eyes: Negative.   Respiratory: Negative.   Cardiovascular: Negative.   Gastrointestinal: Negative for nausea, vomiting and abdominal pain.  Genitourinary: Negative for dysuria.  Musculoskeletal: Positive for back pain.  Skin: Negative.     Neurological: Negative for dizziness, weakness, light-headedness and headaches.  Hematological: Negative.     Allergies  Review of patient's allergies indicates no known allergies.  Home Medications   Current Outpatient Rx  Name Route Sig Dispense Refill  . AMITRIPTYLINE HCL 25 MG PO TABS Oral Take 25 mg by mouth at bedtime.     Marland Kitchen CLONIDINE HCL 0.2 MG PO TABS Oral Take 0.2 mg by mouth at bedtime.     . DUTASTERIDE-TAMSULOSIN HCL 0.5-0.4 MG PO CAPS Oral Take 1 tablet by mouth daily.    . FENOFIBRATE 160 MG PO TABS Oral Take 160 mg by mouth 2 (two) times daily.     Marland Kitchen POLYSACCHARIDE IRON COMPLEX 150 MG PO CAPS Oral Take 150 mg by mouth 2 (two) times daily.    Marland Kitchen LACOSAMIDE 150 MG PO TABS Oral Take 150 mg by mouth 2 (two) times daily.    Marland Kitchen LORAZEPAM 1 MG PO TABS Oral Take 1 mg by mouth 3 (three) times daily.    . ADULT MULTIVITAMIN W/MINERALS CH Oral Take 1 tablet by mouth daily.    Marland Kitchen PANTOPRAZOLE SODIUM 40 MG PO TBEC Oral Take 40 mg by mouth 2 (two) times daily.     Marland Kitchen PRAZOSIN HCL 2 MG PO CAPS Oral Take 2 mg by mouth at bedtime.     . SUCRALFATE 1 GM/10ML PO SUSP  Oral Take 1 g by mouth 2 (two) times daily.    . TRIAMCINOLONE 0.1 % CREAM:EUCERIN CREAM 1:1 Topical Apply 1 application topically 2 (two) times daily as needed. For itching      BP 120/67  Pulse 97  Temp 97.7 F (36.5 C) (Oral)  Resp 18  SpO2 98%  Physical Exam  Nursing note and vitals reviewed. Constitutional: He is oriented to person, place, and time. He appears well-developed and well-nourished. No distress.  HENT:  Head: Normocephalic and atraumatic.  Eyes: Conjunctivae normal are normal. Pupils are equal, round, and reactive to light.  Cardiovascular: Normal rate, regular rhythm and normal heart sounds.   Pulmonary/Chest: Effort normal and breath sounds normal. No respiratory distress. He has no wheezes. He has no rales.  Abdominal: Soft. Bowel sounds are normal. He exhibits no distension. There is no tenderness.  There is no rebound.  Musculoskeletal:       Tenderness over lumbar spine. No bruising, swelling, deformity noted. Tender with palpation over bilateral pelvis. Pain with ROM of right hip, full ROM however of bilateral hips with flexion, internal and external rotation.   Neurological: He is alert and oriented to person, place, and time.  Skin: Skin is warm and dry.  Psychiatric: He has a normal mood and affect.    ED Course  Procedures (including critical care time)  Pt with mechanical fall, no head injury, no LOC, witnessed by roommate. Pt complaining of lower back pain, hip pain. X-rays ordered.   Results for orders placed during the hospital encounter of 06/22/12  URINALYSIS, ROUTINE W REFLEX MICROSCOPIC      Component Value Range   Color, Urine YELLOW  YELLOW   APPearance CLEAR  CLEAR   Specific Gravity, Urine 1.013  1.005 - 1.030   pH 7.0  5.0 - 8.0   Glucose, UA NEGATIVE  NEGATIVE mg/dL   Hgb urine dipstick NEGATIVE  NEGATIVE   Bilirubin Urine NEGATIVE  NEGATIVE   Ketones, ur NEGATIVE  NEGATIVE mg/dL   Protein, ur NEGATIVE  NEGATIVE mg/dL   Urobilinogen, UA 0.2  0.0 - 1.0 mg/dL   Nitrite NEGATIVE  NEGATIVE   Leukocytes, UA NEGATIVE  NEGATIVE  BASIC METABOLIC PANEL      Component Value Range   Sodium 138  135 - 145 mEq/L   Potassium 3.1 (*) 3.5 - 5.1 mEq/L   Chloride 101  96 - 112 mEq/L   CO2 24  19 - 32 mEq/L   Glucose, Bld 101 (*) 70 - 99 mg/dL   BUN 13  6 - 23 mg/dL   Creatinine, Ser 1.47  0.50 - 1.35 mg/dL   Calcium 9.3  8.4 - 82.9 mg/dL   GFR calc non Af Amer 87 (*) >90 mL/min   GFR calc Af Amer >90  >90 mL/min   Dg Lumbar Spine Complete  06/28/2012  *RADIOLOGY REPORT*  Clinical Data: Fall.  Paraplegia.  LUMBAR SPINE - COMPLETE 4+ VIEW  Comparison: 06/12/2012 CT scan  Findings: Cooperation issues caused nonstandard frontal projection, which is oblique to the right.  This reduces diagnostic sensitivity and specificity.  Lower thoracic and lumbar spondylosis observed.  Minimal degenerative grade 1 posterior subluxation of L2-3, similar to prior.  Minimal grade 1 posterior subluxation is also present at L3-4.  No fracture or acute bony findings.  IMPRESSION:  1.  Lumbar spondylosis.  No fracture or acute bony findings observed. 2.  Mildly reduced sensitivity due to positioning and cooperation issues.   Original Report Authenticated  By: Dellia Cloud, M.D.    Dg Hip Bilateral Vito Berger  06/28/2012  *RADIOLOGY REPORT*  Clinical Data: 61 year old male status post fall.  Paraplegia. Pain.  BILATERAL HIP WITH PELVIS - 4+ VIEW  Comparison: 01/29/2012.  Findings: Stable bone mineralization. Chronic widening of the symphysis pubis appears stable.  Zipper artifact.  Hip joint spaces appear stable. Right proximal femur appears intact.  Left proximal femur appears intact.  SI joints within normal limits.  No acute fracture of the pelvis identified.  IMPRESSION: No acute fracture or dislocation identified about the bilateral hips or pelvis.  .   Original Report Authenticated By: Harley Hallmark, M.D.       8:38 PM  X-rays negative. Pt at baseline. Hx of frequent falls. Pt asking to be discharged. He will be sent back in stable condition.    1. Fall   2. Lumbar strain   3. Pain in right hip       MDM          Lottie Mussel, PA 06/29/12 0151

## 2012-06-28 NOTE — ED Notes (Signed)
Per EMS - pt was getting out of wheelchair to move to bed, pt reports he fell and slide onto the ground. Pt c/o lower back pain. No neck pain, no LOC. VVS throughout transport. Pt from home apartment, has a roommate. Pt A&Ox4, pt has slurred speech but is normal for pt.

## 2012-06-28 NOTE — ED Notes (Signed)
Patient was taken back to his home by [facility].

## 2012-06-29 ENCOUNTER — Emergency Department (HOSPITAL_COMMUNITY)
Admission: EM | Admit: 2012-06-29 | Discharge: 2012-06-29 | Disposition: A | Discharge status: Home / Self Care | Payer: Medicare Other | Attending: Emergency Medicine | Admitting: Emergency Medicine

## 2012-06-29 ENCOUNTER — Encounter (HOSPITAL_COMMUNITY): Payer: Self-pay | Admitting: *Deleted

## 2012-06-29 ENCOUNTER — Emergency Department (HOSPITAL_COMMUNITY): Payer: Medicare Other

## 2012-06-29 DIAGNOSIS — G809 Cerebral palsy, unspecified: Secondary | ICD-10-CM | POA: Insufficient documentation

## 2012-06-29 DIAGNOSIS — Z049 Encounter for examination and observation for unspecified reason: Secondary | ICD-10-CM | POA: Insufficient documentation

## 2012-06-29 DIAGNOSIS — F341 Dysthymic disorder: Secondary | ICD-10-CM | POA: Insufficient documentation

## 2012-06-29 DIAGNOSIS — F79 Unspecified intellectual disabilities: Secondary | ICD-10-CM

## 2012-06-29 DIAGNOSIS — I10 Essential (primary) hypertension: Secondary | ICD-10-CM | POA: Insufficient documentation

## 2012-06-29 DIAGNOSIS — W19XXXA Unspecified fall, initial encounter: Secondary | ICD-10-CM | POA: Insufficient documentation

## 2012-06-29 NOTE — ED Provider Notes (Signed)
History     CSN: [id number]  Arrival date & time 06/29/12  1046   First MD Initiated Contact with Patient 06/29/12 1121      Chief Complaint  Patient presents with  . Fall    (Consider location/radiation/quality/duration/timing/severity/associated sxs/prior treatment) HPIJerry W Bradley is a 61 y.o. male with a history of seizures (although he is on no seizure medication), cerebral palsy and developmental delay. Patient has a history of falling frequently and coming into the emergency department, he's had multiple workups and ends up getting discharged home. Patient is not complaining about any pain at this time, he presents with C-spine precautions but does complain of neck pain at all. He has not complained of headache only some minor right hip pain. Pain does not radiate he has not taken anything for it, there are no other alleviating or exacerbating factors.    Past Medical History  Diagnosis Date  . Seizures   . Cerebral palsy   . Hypertension   . Mental retardation   . Depression   . Nephrolithiasis   . BPH (benign prostatic hyperplasia)   . GI bleed 2009    coffee ground emesis, erosive esophagitis on EGD by Dr Juanda Chance  . Esophageal stricture 2009    not dilated during the EGD, biopsy benign:  no Barretts  . Depression with anxiety     Past Surgical History  Procedure Date  . Basal cell carcinoma excision 2002    forehead  . Squamous cell carcinoma excision 2004    lip  . Bowen dz 2002    right cheek   . Esophagogastroduodenoscopy 06/14/2012    Procedure: ESOPHAGOGASTRODUODENOSCOPY (EGD);  Surgeon: Rachael Fee, MD;  Location: [facility] ENDOSCOPY;  Service: Endoscopy;  Laterality: N/A;    No family history on file.  History  Substance Use Topics  . Smoking status: Never Smoker   . Smokeless tobacco: Not on file  . Alcohol Use: No      Review of Systems patient is an unreliable historian, I think the review of systems is limited due to his lack of recall.    Allergies  Review of patient's allergies indicates no known allergies.  Home Medications   Current Outpatient Rx  Name Route Sig Dispense Refill  . AMITRIPTYLINE HCL 25 MG PO TABS Oral Take 25 mg by mouth at bedtime.     Marland Kitchen CLONIDINE HCL 0.2 MG PO TABS Oral Take 0.2 mg by mouth at bedtime.     . DUTASTERIDE-TAMSULOSIN HCL 0.5-0.4 MG PO CAPS Oral Take 1 tablet by mouth daily.    . FENOFIBRATE 160 MG PO TABS Oral Take 160 mg by mouth 2 (two) times daily.     Marland Kitchen POLYSACCHARIDE IRON COMPLEX 150 MG PO CAPS Oral Take 150 mg by mouth 2 (two) times daily.    Marland Kitchen LACOSAMIDE 150 MG PO TABS Oral Take 150 mg by mouth 2 (two) times daily.    Marland Kitchen LORAZEPAM 1 MG PO TABS Oral Take 1 mg by mouth 3 (three) times daily.    . ADULT MULTIVITAMIN W/MINERALS CH Oral Take 1 tablet by mouth daily.    Marland Kitchen PANTOPRAZOLE SODIUM 40 MG PO TBEC Oral Take 40 mg by mouth 2 (two) times daily.     Marland Kitchen PRAZOSIN HCL 2 MG PO CAPS Oral Take 2 mg by mouth at bedtime.     . SUCRALFATE 1 GM/10ML PO SUSP Oral Take 1 g by mouth 2 (two) times daily.    . TRIAMCINOLONE 0.1 %  CREAM:EUCERIN CREAM 1:1 Topical Apply 1 application topically 2 (two) times daily as needed. For itching      BP 147/74  Pulse 90  Temp 97.5 F (36.4 C)  Resp 18  SpO2 98%  Physical Exam  Nursing notes reviewed.  Electronic medical record reviewed. VITAL SIGNS:   Filed Vitals:   06/29/12 1109  BP: 147/74  Pulse: 90  Temp: 97.5 F (36.4 C)  Resp: 18  SpO2: 98%   CONSTITUTIONAL: Awake, oriented, appears non-toxic HENT: Normocephalic, oral mucosa pink and moist, airway patent. Nares patent without drainage. Small 1 cm diameter abrasion to the bridge of the nose. External ears normal. Head is otherwise atraumatic. EYES: Conjunctiva clear, EOMI, PERRLA NECK: Trachea midline, non-tender, supple CARDIOVASCULAR: Normal heart rate, Normal rhythm, No murmurs, rubs, gallops PULMONARY/CHEST: Clear to auscultation, no rhonchi, wheezes, or rales. Symmetrical breath  sounds. Non-tender. ABDOMINAL: Non-distended, soft, non-tender - no rebound or guarding.  BS normal. NEUROLOGIC: Alert, no new separate sensory deficits, mild spastic tone in the upper extremities, paraplegic with respect to lower extremities. Follows commands.  EXTREMITIES: No clubbing, cyanosis, or edema.  SKIN: Warm, Dry, No erythema, No rash PSYCH: Mood is mildly anxious, he's asking to return home, mood congruent with affect, patient has troubles recalling recent and distant memory   ED Course  Procedures (including critical care time)  Labs Reviewed - No data to display Dg Lumbar Spine Complete  06/28/2012  *RADIOLOGY REPORT*  Clinical Data: Fall.  Paraplegia.  LUMBAR SPINE - COMPLETE 4+ VIEW  Comparison: 06/12/2012 CT scan  Findings: Cooperation issues caused nonstandard frontal projection, which is oblique to the right.  This reduces diagnostic sensitivity and specificity.  Lower thoracic and lumbar spondylosis observed. Minimal degenerative grade 1 posterior subluxation of L2-3, similar to prior.  Minimal grade 1 posterior subluxation is also present at L3-4.  No fracture or acute bony findings.  IMPRESSION:  1.  Lumbar spondylosis.  No fracture or acute bony findings observed. 2.  Mildly reduced sensitivity due to positioning and cooperation issues.   Original Report Authenticated By: Dellia Cloud, M.D.    Dg Hip Bilateral Vito Berger  06/28/2012  *RADIOLOGY REPORT*  Clinical Data: 61 year old male status post fall.  Paraplegia. Pain.  BILATERAL HIP WITH PELVIS - 4+ VIEW  Comparison: 01/29/2012.  Findings: Stable bone mineralization. Chronic widening of the symphysis pubis appears stable.  Zipper artifact.  Hip joint spaces appear stable. Right proximal femur appears intact.  Left proximal femur appears intact.  SI joints within normal limits.  No acute fracture of the pelvis identified.  IMPRESSION: No acute fracture or dislocation identified about the bilateral hips or pelvis.  .    Original Report Authenticated By: Harley Hallmark, M.D.      No diagnosis found.    MDM  Colin Bradley is a 61 y.o. male with a history of multiple ER visits for falls, patient has a written history of seizures but denies any seizure today. Patient is not currently taking any seizure medications. She complains of some mild right hip pain. He does have a small abrasion to the nose.  Patient is not taking any blood thinning medications, based on his prior notes it seems he is at his baseline, no focal neurologic deficits that are new and may be think that he's got intracranial hemorrhage, do not think he needs a CT of his head. I do not think we need to pursue a complete laboratory workup either. I will obtain x-rays of the  right hip to rule out fracture in this patient who is paraplegic otherwise wheelchair-bound. In addition we'll consult social work to see if the patient is placed in the past spot 4 emeses had multiple falls and multiple ER visits for the same problems.  Per case management and social work, which has prevented Mr. Vickers from having his normal home health care nurse as well as PT OT and social work because it was not reordered after an admission. Home health PT/OT, social work and home nurse have been ordered and will be discharged back home in good condition.          Jones Skene, MD 06/29/12 1810

## 2012-06-29 NOTE — Progress Notes (Signed)
[facility] ED CM consulted by SW about d/c planning for pt. Reporting EDP wanting assist with facility placement for pt CM spoke with pt to assess home status and pt plans for disposition Pt is wanting d/c home with plans for facility placement that will help him decrease his falls and ED visits Pt reports having a room mate, [facility] and recent stay at [facility].  Pt agrees to d/c home with HHRN/PT/OT/SW.  CM spoke with Darl Pikes of [facility] to confirm pt had HHRN/PT/OT/SW services prior to 9/6-9/13 mc admission but not resumed.  CM spoke with Dr Rulon Abide who agrees to resume orders for HHRN/PT/OT/SW for d/c home with assist of Dr Evlyn Kanner office for further placement ED CM l;left a voice message on Dr Rinaldo Cloud on call RN line [phone] requesting assist in getting pt to facility Left CM contact number for further questions

## 2012-06-29 NOTE — Progress Notes (Signed)
CSW consult for d/c needs. Pt with 15 ED admissions in 6 months. Pt lives in apartment [facility]) with a roommate, Leslee Home [phone]. Pt has a DSS Caseworker, Marcelino Duster [phone]. CSW placed call to pt's case worker requesting return call to determine if additional services available to pt in community vs. Possible higher level of care in group home setting? CSW also left message for pt's father, Simeon Craft [phone], requesting return call.  RNCM notified and is also reviewing for any additional resources.  Dellie Burns, MSW, NFAOZ [phone] (ED coverage)

## 2012-06-29 NOTE — ED Provider Notes (Signed)
Medical screening examination/treatment/procedure(s) were performed by non-physician practitioner and as supervising physician I was immediately available for consultation/collaboration.   Shelda Jakes, MD 06/29/12 [phone]

## 2012-06-29 NOTE — ED Notes (Signed)
AVW:[id number]<XB> Expected date:06/29/12<BR> Expected time:10:32 AM<BR> Means of arrival:Ambulance<BR> Comments:<BR> ElderlyM,unwitnessed fall

## 2012-06-29 NOTE — ED Notes (Signed)
Awaiting [facility] arrival 

## 2012-06-29 NOTE — ED Notes (Signed)
Per EMS pt was found lying on ground by [employer] that was called by pt's room mate. Pt has hx MR. C/o of right hip pain 4/10.  Abrasion to nose. Denies any neck or head pain.

## 2012-06-29 NOTE — ED Notes (Signed)
Called to check on [facility] transport status. Communications sts ems is "running behind."

## 2012-06-30 ENCOUNTER — Emergency Department (HOSPITAL_COMMUNITY)
Admission: EM | Admit: 2012-06-30 | Discharge: 2012-06-30 | Disposition: A | Discharge status: Home / Self Care | Payer: Medicare Other | Attending: Emergency Medicine | Admitting: Emergency Medicine

## 2012-06-30 DIAGNOSIS — I10 Essential (primary) hypertension: Secondary | ICD-10-CM | POA: Insufficient documentation

## 2012-06-30 DIAGNOSIS — R079 Chest pain, unspecified: Secondary | ICD-10-CM | POA: Insufficient documentation

## 2012-06-30 DIAGNOSIS — W19XXXA Unspecified fall, initial encounter: Secondary | ICD-10-CM

## 2012-06-30 DIAGNOSIS — F3289 Other specified depressive episodes: Secondary | ICD-10-CM | POA: Insufficient documentation

## 2012-06-30 DIAGNOSIS — F329 Major depressive disorder, single episode, unspecified: Secondary | ICD-10-CM | POA: Insufficient documentation

## 2012-06-30 DIAGNOSIS — R29810 Facial weakness: Secondary | ICD-10-CM | POA: Insufficient documentation

## 2012-06-30 DIAGNOSIS — F411 Generalized anxiety disorder: Secondary | ICD-10-CM | POA: Insufficient documentation

## 2012-06-30 DIAGNOSIS — F79 Unspecified intellectual disabilities: Secondary | ICD-10-CM | POA: Insufficient documentation

## 2012-06-30 DIAGNOSIS — Z993 Dependence on wheelchair: Secondary | ICD-10-CM | POA: Insufficient documentation

## 2012-06-30 DIAGNOSIS — Z9181 History of falling: Secondary | ICD-10-CM | POA: Insufficient documentation

## 2012-06-30 DIAGNOSIS — G809 Cerebral palsy, unspecified: Secondary | ICD-10-CM | POA: Insufficient documentation

## 2012-06-30 NOTE — ED Notes (Signed)
Pt to department via EMS from home- pt lives at home and has a home health care nurse that checks on him. She reports that she found him in his wheelchair with right-sided facial droop and weakness. Pt also had slurred speech. Unknown LSN. Bp-140/90 Cbg-90 HR-80. 18g LAC.

## 2012-07-07 NOTE — ED Provider Notes (Signed)
History     CSN: [id number]  Arrival date & time 06/30/12  1031   First MD Initiated Contact with Patient 06/30/12 1104      Chief Complaint  Patient presents with  . stroke like symptoms     (Consider location/radiation/quality/duration/timing/severity/associated sxs/prior treatment) HPI.LADARIOUS KRESSE is a 61 y.o. male whom I saw yesterday at [facility] hospital fall. Patient has a pertinent medical history of seizures, cerebral palsy and mental retardation patient says he ran his wheelchair into a wall and his chest hurts.  Currently minor pain, no marks or bleeding, no SOB, ureters or chills. No exertional chest pain. Patient denies any seizure activity today.  History per EMS, home health nurse says pt had right sided facial droop and weakness with slurred speech.  These symptoms were not present on transport and not present now.  No other complaints.  Past Medical History  Diagnosis Date  . Seizures   . Cerebral palsy   . Hypertension   . Mental retardation   . Depression   . Nephrolithiasis   . BPH (benign prostatic hyperplasia)   . GI bleed 2009    coffee ground emesis, erosive esophagitis on EGD by Dr Juanda Chance  . Esophageal stricture 2009    not dilated during the EGD, biopsy benign:  no Barretts  . Depression with anxiety     Past Surgical History  Procedure Date  . Basal cell carcinoma excision 2002    forehead  . Squamous cell carcinoma excision 2004    lip  . Bowen dz 2002    right cheek   . Esophagogastroduodenoscopy 06/14/2012    Procedure: ESOPHAGOGASTRODUODENOSCOPY (EGD);  Surgeon: Rachael Fee, MD;  Location: [facility] ENDOSCOPY;  Service: Endoscopy;  Laterality: N/A;    No family history on file.  History  Substance Use Topics  . Smoking status: Never Smoker   . Smokeless tobacco: Not on file  . Alcohol Use: No      Review of Systems At least 10pt or greater review of systems completed and are negative except where specified in the HPI.  Allergies    Review of patient's allergies indicates no known allergies.  Home Medications   Current Outpatient Rx  Name Route Sig Dispense Refill  . AMITRIPTYLINE HCL 25 MG PO TABS Oral Take 25 mg by mouth at bedtime.     Marland Kitchen CLONIDINE HCL 0.2 MG PO TABS Oral Take 0.2 mg by mouth at bedtime.     . DUTASTERIDE-TAMSULOSIN HCL 0.5-0.4 MG PO CAPS Oral Take 1 tablet by mouth daily.    . FENOFIBRATE 160 MG PO TABS Oral Take 160 mg by mouth 2 (two) times daily.     Marland Kitchen POLYSACCHARIDE IRON COMPLEX 150 MG PO CAPS Oral Take 150 mg by mouth 2 (two) times daily.    Marland Kitchen LACOSAMIDE 150 MG PO TABS Oral Take 150 mg by mouth 2 (two) times daily.    Marland Kitchen LORAZEPAM 1 MG PO TABS Oral Take 1 mg by mouth 3 (three) times daily.    . ADULT MULTIVITAMIN W/MINERALS CH Oral Take 1 tablet by mouth daily.    Marland Kitchen PANTOPRAZOLE SODIUM 40 MG PO TBEC Oral Take 40 mg by mouth 2 (two) times daily.     Marland Kitchen PRAZOSIN HCL 2 MG PO CAPS Oral Take 2 mg by mouth at bedtime.     . SUCRALFATE 1 GM/10ML PO SUSP Oral Take 1 g by mouth 2 (two) times daily.    . TRIAMCINOLONE 0.1 % CREAM:EUCERIN CREAM  1:1 Topical Apply 1 application topically 2 (two) times daily as needed. For itching      BP 168/91  Pulse 99  Temp 98 F (36.7 C) (Oral)  Resp 18  SpO2 100%  Physical Exam  Nursing notes reviewed.  Electronic medical record reviewed. VITAL SIGNS:   Filed Vitals:   06/30/12 1036 06/30/12 1324 06/30/12 1630 06/30/12 1847  BP: 116/72 135/61 149/83 168/91  Pulse:    99  Temp: 97.4 F (36.3 C)   98 F (36.7 C)  TempSrc: Oral   Oral  Resp: 16 16  18   SpO2: 100% 97%  100%   CONSTITUTIONAL: Awake, oriented, appears non-toxic HENT: Atraumatic, normocephalic, oral mucosa pink and moist, airway patent. Nares patent without drainage. External ears normal. EYES: Conjunctiva clear, EOMI, PERRLA NECK: Trachea midline, non-tender, supple CARDIOVASCULAR: Normal heart rate, Normal rhythm, No murmurs, rubs, gallops PULMONARY/CHEST: Clear to auscultation, no  rhonchi, wheezes, or rales. Symmetrical breath sounds. Non-tender. ABDOMINAL: Non-distended, soft, non-tender - no rebound or guarding.  BS normal. NEUROLOGIC: Non-focal, moving all four extremities, spastic extremities, no gross sensory or motor deficits.  EXTREMITIES: No clubbing, cyanosis, or edema SKIN: Warm, Dry, No erythema, No rash  ED Course  Procedures (including critical care time)  Labs Reviewed - No data to display No results found.   1. Wheelchair bound   2. Fall     MDM  TSUGIO GLEN is a 61 y.o. male is a patient with cerebral palsy, mental retardation, seizures who is wheelchair-bound at home and has had frequent falls-care of this patient last night at [facility] he has had 16 ER admissions over the last month home health nurses she found him with right-sided and weakness with slurred 8 however ICD-9 of this on presentation today. It appears to be at his baseline, he is saying he is at some minor right sided chest wall pain from running his wheelchair into a wall but is otherwise feeling well, he's asking for something to eat and would like a Coke.  Arrange for the patient to have home health care nurse as well as social work, PT and OT consults.  I do not think any further workup is indicated at this time..  Pt to be DC home stable and good condition.          Jones Skene, MD 07/07/12 [phone]

## 2012-07-12 ENCOUNTER — Encounter (HOSPITAL_COMMUNITY): Payer: Self-pay

## 2012-07-12 ENCOUNTER — Emergency Department (HOSPITAL_COMMUNITY): Payer: Medicare Other

## 2012-07-12 ENCOUNTER — Emergency Department (HOSPITAL_COMMUNITY)
Admission: EM | Admit: 2012-07-12 | Discharge: 2012-07-12 | Disposition: A | Discharge status: Home / Self Care | Payer: Medicare Other | Attending: Emergency Medicine | Admitting: Emergency Medicine

## 2012-07-12 DIAGNOSIS — F341 Dysthymic disorder: Secondary | ICD-10-CM | POA: Insufficient documentation

## 2012-07-12 DIAGNOSIS — I10 Essential (primary) hypertension: Secondary | ICD-10-CM | POA: Insufficient documentation

## 2012-07-12 DIAGNOSIS — G809 Cerebral palsy, unspecified: Secondary | ICD-10-CM | POA: Insufficient documentation

## 2012-07-12 DIAGNOSIS — F79 Unspecified intellectual disabilities: Secondary | ICD-10-CM | POA: Insufficient documentation

## 2012-07-12 DIAGNOSIS — K5900 Constipation, unspecified: Secondary | ICD-10-CM | POA: Insufficient documentation

## 2012-07-12 DIAGNOSIS — R109 Unspecified abdominal pain: Secondary | ICD-10-CM | POA: Insufficient documentation

## 2012-07-12 DIAGNOSIS — Z79899 Other long term (current) drug therapy: Secondary | ICD-10-CM | POA: Insufficient documentation

## 2012-07-12 DIAGNOSIS — G40909 Epilepsy, unspecified, not intractable, without status epilepticus: Secondary | ICD-10-CM | POA: Insufficient documentation

## 2012-07-12 LAB — BASIC METABOLIC PANEL
Calcium: 9.7 mg/dL (ref 8.4–10.5)
Creatinine, Ser: 1.08 mg/dL (ref 0.50–1.35)
GFR calc Af Amer: 83 mL/min — ABNORMAL LOW (ref >90)
GFR calc non Af Amer: 72 mL/min — ABNORMAL LOW (ref >90)
Sodium: 140 mEq/L (ref 135–145)

## 2012-07-12 LAB — CBC WITH DIFFERENTIAL/PLATELET
Basophils Absolute: 0 10*3/uL (ref 0.0–0.1)
Eosinophils Relative: 2 % (ref 0–5)
Lymphocytes Relative: 30 % (ref 12–46)
MCV: 92.6 fL (ref 78.0–100.0)
Neutro Abs: 2.9 10*3/uL (ref 1.7–7.7)
Neutrophils Relative %: 59 % (ref 43–77)
Platelets: 224 10*3/uL (ref 150–400)
RDW: 13.1 % (ref 11.5–15.5)
WBC: 4.8 10*3/uL (ref 4.0–10.5)

## 2012-07-12 MED ORDER — BISACODYL 5 MG PO TBEC: 5.0000 mg | DELAYED_RELEASE_TABLET | Freq: Two times a day (BID) | ORAL | Status: DC

## 2012-07-12 NOTE — ED Provider Notes (Addendum)
History     CSN: [id number]  Arrival date & time 07/12/12  1818   First MD Initiated Contact with Patient 07/12/12 1925      Chief Complaint  Patient presents with  . Abdominal Pain  . Constipation    (Consider location/radiation/quality/duration/timing/severity/associated sxs/prior treatment) Patient is a 61 y.o. male presenting with abdominal pain and constipation. The history is provided by the patient and medical records.  Abdominal Pain The primary symptoms of the illness include abdominal pain. The primary symptoms of the illness do not include fever, shortness of breath, nausea, vomiting or diarrhea.  Additional symptoms associated with the illness include constipation.  Constipation  Associated symptoms include abdominal pain. Pertinent negatives include no fever, no diarrhea, no nausea, no vomiting, no chest pain, no headaches and no coughing.  1 y male with hx CP, SZ, MR presents to ed with abd pain and constipation. No vomiting. No hx of abd surgery.   No other pain.    Past Medical History  Diagnosis Date  . Seizures   . Cerebral palsy   . Hypertension   . Mental retardation   . Depression   . Nephrolithiasis   . BPH (benign prostatic hyperplasia)   . GI bleed 2009    coffee ground emesis, erosive esophagitis on EGD by Dr Juanda Chance  . Esophageal stricture 2009    not dilated during the EGD, biopsy benign:  no Barretts  . Depression with anxiety     Past Surgical History  Procedure Date  . Basal cell carcinoma excision 2002    forehead  . Squamous cell carcinoma excision 2004    lip  . Bowen dz 2002    right cheek   . Esophagogastroduodenoscopy 06/14/2012    Procedure: ESOPHAGOGASTRODUODENOSCOPY (EGD);  Surgeon: Rachael Fee, MD;  Location: [facility] ENDOSCOPY;  Service: Endoscopy;  Laterality: N/A;    History reviewed. No pertinent family history.  History  Substance Use Topics  . Smoking status: Never Smoker   . Smokeless tobacco: Not on file  . Alcohol  Use: No      Review of Systems  Constitutional: Negative for fever.  Respiratory: Negative for cough and shortness of breath.   Cardiovascular: Negative for chest pain.  Gastrointestinal: Positive for abdominal pain and constipation. Negative for nausea, vomiting and diarrhea.  Neurological: Negative for headaches.  All other systems reviewed and are negative.    Allergies  Review of patient's allergies indicates no known allergies.  Home Medications   Current Outpatient Rx  Name Route Sig Dispense Refill  . AMITRIPTYLINE HCL 25 MG PO TABS Oral Take 25 mg by mouth at bedtime.     Marland Kitchen CLONIDINE HCL 0.2 MG PO TABS Oral Take 0.2 mg by mouth at bedtime.     . DUTASTERIDE-TAMSULOSIN HCL 0.5-0.4 MG PO CAPS Oral Take 1 tablet by mouth daily.    . FENOFIBRATE 160 MG PO TABS Oral Take 160 mg by mouth 2 (two) times daily.     Marland Kitchen POLYSACCHARIDE IRON COMPLEX 150 MG PO CAPS Oral Take 150 mg by mouth 2 (two) times daily.    Marland Kitchen LACOSAMIDE 150 MG PO TABS Oral Take 150 mg by mouth 2 (two) times daily.    Marland Kitchen LORAZEPAM 1 MG PO TABS Oral Take 1 mg by mouth 3 (three) times daily.    . ADULT MULTIVITAMIN W/MINERALS CH Oral Take 1 tablet by mouth daily.    Marland Kitchen PANTOPRAZOLE SODIUM 40 MG PO TBEC Oral Take 40 mg by mouth 2 (  two) times daily.     Marland Kitchen PRAZOSIN HCL 2 MG PO CAPS Oral Take 2 mg by mouth at bedtime.     . SUCRALFATE 1 GM/10ML PO SUSP Oral Take 1 g by mouth 2 (two) times daily.    . TRIAMCINOLONE 0.1 % CREAM:EUCERIN CREAM 1:1 Topical Apply 1 application topically 2 (two) times daily as needed. For itching      BP 167/92  Pulse 75  Temp 98 F (36.7 C) (Oral)  Resp 18  SpO2 100%  Physical Exam  Nursing note and vitals reviewed. Constitutional: No distress.  HENT:  Head: Normocephalic and atraumatic.  Eyes: Conjunctivae normal and EOM are normal.  Neck: Normal range of motion. Neck supple.  Cardiovascular: Normal rate.   No murmur heard. Pulmonary/Chest: Effort normal. No respiratory  distress.  Abdominal: Soft. Bowel sounds are normal. There is no tenderness. There is no rebound and no guarding.  Neurological: He is alert.  Skin: Skin is warm and dry.    ED Course  Procedures (including critical care time) 64 y male with MR, epilepsy and cp  Who does not ambulate has constipation and abd pain. No distress. No acute abdomen.  Will check labs and xr.    Labs Reviewed  BASIC METABOLIC PANEL  CBC WITH DIFFERENTIAL   No results found.   No diagnosis found.    MDM  Abdominal pain Constipation         Cheri Guppy, MD 07/12/12 2012  Cheri Guppy, MD 07/12/12 2150

## 2012-07-12 NOTE — ED Notes (Signed)
Patient transported to X-ray 

## 2012-07-12 NOTE — ED Notes (Signed)
Ptar called 

## 2012-07-12 NOTE — ED Notes (Signed)
Dr. Caporossi at bedside. 

## 2012-07-12 NOTE — ED Notes (Signed)
Per [facility], pt c/o left lower abd pain and constipation for past couple days. Pt covered in urine. 160/98, 82 HR, 18 RR

## 2012-07-21 ENCOUNTER — Encounter (HOSPITAL_COMMUNITY): Payer: Self-pay

## 2012-07-21 ENCOUNTER — Emergency Department (HOSPITAL_COMMUNITY)
Admission: EM | Admit: 2012-07-21 | Discharge: 2012-07-21 | Disposition: A | Discharge status: Home / Self Care | Payer: Medicare Other | Attending: Emergency Medicine | Admitting: Emergency Medicine

## 2012-07-21 DIAGNOSIS — I10 Essential (primary) hypertension: Secondary | ICD-10-CM | POA: Insufficient documentation

## 2012-07-21 DIAGNOSIS — Z049 Encounter for examination and observation for unspecified reason: Secondary | ICD-10-CM | POA: Insufficient documentation

## 2012-07-21 DIAGNOSIS — G809 Cerebral palsy, unspecified: Secondary | ICD-10-CM | POA: Insufficient documentation

## 2012-07-21 DIAGNOSIS — W19XXXA Unspecified fall, initial encounter: Secondary | ICD-10-CM | POA: Insufficient documentation

## 2012-07-21 DIAGNOSIS — F79 Unspecified intellectual disabilities: Secondary | ICD-10-CM | POA: Insufficient documentation

## 2012-07-21 DIAGNOSIS — F341 Dysthymic disorder: Secondary | ICD-10-CM | POA: Insufficient documentation

## 2012-07-21 NOTE — ED Notes (Signed)
Per EMS- Patient fell while trying to transfer himself from the bed to his motorized wheelchair. Patient has cerebral palsy and roommates also have cerebral palsy. Roommates reported that the patient has had increased falls and increased confusion in the past 2 months. EMS reported that the patient was incontinent upon arrival. No injuries noted and pateint denies any injuries or pain at this time.

## 2012-07-21 NOTE — ED Notes (Signed)
[id number]<JY> Expected date:<BR> Expected time:<BR> Means of arrival:<BR> Comments:<BR> CP pt fell out of chair

## 2012-07-21 NOTE — ED Provider Notes (Signed)
History     CSN: [id number]  Arrival date & time 07/21/12  0903   First MD Initiated Contact with Patient 07/21/12 [phone]      Chief Complaint  Patient presents with  . Fall    (Consider location/radiation/quality/duration/timing/severity/associated sxs/prior treatment) HPI Pt with history of cerebral palsy and developmental delay with numerous ED visits, mostly for falls, was brought to the ED via EMS after he fell transferring from bed to motorized wheelchair this morning. He denies any injury, no pain, requesting crackers and soda.   Past Medical History  Diagnosis Date  . Seizures   . Cerebral palsy   . Hypertension   . Mental retardation   . Depression   . Nephrolithiasis   . BPH (benign prostatic hyperplasia)   . GI bleed 2009    coffee ground emesis, erosive esophagitis on EGD by Dr Juanda Chance  . Esophageal stricture 2009    not dilated during the EGD, biopsy benign:  no Barretts  . Depression with anxiety     Past Surgical History  Procedure Date  . Basal cell carcinoma excision 2002    forehead  . Squamous cell carcinoma excision 2004    lip  . Bowen dz 2002    right cheek   . Esophagogastroduodenoscopy 06/14/2012    Procedure: ESOPHAGOGASTRODUODENOSCOPY (EGD);  Surgeon: Rachael Fee, MD;  Location: [facility] ENDOSCOPY;  Service: Endoscopy;  Laterality: N/A;    No family history on file.  History  Substance Use Topics  . Smoking status: Never Smoker   . Smokeless tobacco: Not on file  . Alcohol Use: No      Review of Systems All other systems reviewed and are negative except as noted in HPI.   Allergies  Review of patient's allergies indicates no known allergies.  Home Medications   Current Outpatient Rx  Name Route Sig Dispense Refill  . AMITRIPTYLINE HCL 25 MG PO TABS Oral Take 25 mg by mouth at bedtime.     Marland Kitchen BISACODYL 5 MG PO TBEC Oral Take 1 tablet (5 mg total) by mouth 2 (two) times daily. 14 tablet 0  . CLONIDINE HCL 0.2 MG PO TABS Oral Take  0.2 mg by mouth at bedtime.     . DUTASTERIDE-TAMSULOSIN HCL 0.5-0.4 MG PO CAPS Oral Take 1 tablet by mouth daily.    . FENOFIBRATE 160 MG PO TABS Oral Take 160 mg by mouth 2 (two) times daily.     Marland Kitchen POLYSACCHARIDE IRON COMPLEX 150 MG PO CAPS Oral Take 150 mg by mouth 2 (two) times daily.    Marland Kitchen LACOSAMIDE 150 MG PO TABS Oral Take 150 mg by mouth 2 (two) times daily.    Marland Kitchen LORAZEPAM 1 MG PO TABS Oral Take 1 mg by mouth 3 (three) times daily.    . ADULT MULTIVITAMIN W/MINERALS CH Oral Take 1 tablet by mouth daily.    Marland Kitchen PANTOPRAZOLE SODIUM 40 MG PO TBEC Oral Take 40 mg by mouth 2 (two) times daily.     Marland Kitchen PRAZOSIN HCL 2 MG PO CAPS Oral Take 2 mg by mouth at bedtime.     . SUCRALFATE 1 GM/10ML PO SUSP Oral Take 1 g by mouth 2 (two) times daily.    . TRIAMCINOLONE 0.1 % CREAM:EUCERIN CREAM 1:1 Topical Apply 1 application topically 2 (two) times daily as needed. For itching      BP 153/69  Pulse 69  Temp 97.5 F (36.4 C) (Oral)  Resp 16  SpO2 95%  Physical Exam  Nursing note and vitals reviewed. Constitutional: He appears well-developed and well-nourished.  HENT:  Head: Normocephalic and atraumatic.  Eyes: EOM are normal. Pupils are equal, round, and reactive to light.  Neck: Normal range of motion. Neck supple.  Cardiovascular: Normal rate, normal heart sounds and intact distal pulses.   Pulmonary/Chest: Effort normal and breath sounds normal.  Abdominal: Bowel sounds are normal. He exhibits no distension. There is no tenderness.  Musculoskeletal: He exhibits no edema and no tenderness.       Spastic lower extremities at baseline, no deformity or bruising  Neurological: He is alert. He has normal strength. No cranial nerve deficit or sensory deficit.  Skin: Skin is warm and dry. No rash noted.  Psychiatric: He has a normal mood and affect.    ED Course  Procedures (including critical care time)  Labs Reviewed - No data to display No results found.   No diagnosis found.    MDM    Pt with fall while transferring to a wheelchair. Has had numerous ED visits for similar with prior workup negative. He denies any symptoms at present. He is only asking for crackers and soda. He reports he has not been to see his PCP in 'a while'. There is no injury apparent today, no indication for further ED workup. Will return patient to his home. Advised him to follow up with his PCP as needed.         Charles B. Bernette Mayers, MD 07/21/12 [phone]

## 2012-07-27 ENCOUNTER — Emergency Department (HOSPITAL_COMMUNITY)
Admission: EM | Admit: 2012-07-27 | Discharge: 2012-07-27 | Disposition: A | Discharge status: Home / Self Care | Payer: Medicare Other | Attending: Emergency Medicine | Admitting: Emergency Medicine

## 2012-07-27 DIAGNOSIS — I10 Essential (primary) hypertension: Secondary | ICD-10-CM | POA: Insufficient documentation

## 2012-07-27 DIAGNOSIS — Y929 Unspecified place or not applicable: Secondary | ICD-10-CM | POA: Insufficient documentation

## 2012-07-27 DIAGNOSIS — Z79899 Other long term (current) drug therapy: Secondary | ICD-10-CM | POA: Insufficient documentation

## 2012-07-27 DIAGNOSIS — Y9389 Activity, other specified: Secondary | ICD-10-CM | POA: Insufficient documentation

## 2012-07-27 DIAGNOSIS — F79 Unspecified intellectual disabilities: Secondary | ICD-10-CM | POA: Insufficient documentation

## 2012-07-27 DIAGNOSIS — F3289 Other specified depressive episodes: Secondary | ICD-10-CM | POA: Insufficient documentation

## 2012-07-27 DIAGNOSIS — F329 Major depressive disorder, single episode, unspecified: Secondary | ICD-10-CM | POA: Insufficient documentation

## 2012-07-27 DIAGNOSIS — G809 Cerebral palsy, unspecified: Secondary | ICD-10-CM | POA: Insufficient documentation

## 2012-07-27 DIAGNOSIS — W19XXXA Unspecified fall, initial encounter: Secondary | ICD-10-CM | POA: Insufficient documentation

## 2012-07-27 DIAGNOSIS — Z043 Encounter for examination and observation following other accident: Secondary | ICD-10-CM | POA: Insufficient documentation

## 2012-07-27 DIAGNOSIS — Z8719 Personal history of other diseases of the digestive system: Secondary | ICD-10-CM | POA: Insufficient documentation

## 2012-07-27 NOTE — ED Notes (Addendum)
Pt taken off KED with physician, RN, and NT.

## 2012-07-27 NOTE — ED Notes (Signed)
[id number]<VW> Expected date:<BR> Expected time:<BR> Means of arrival:<BR> Comments:<BR> M LSB

## 2012-07-27 NOTE — ED Provider Notes (Signed)
History     CSN: [id number]  Arrival date & time 07/27/12  1842   First MD Initiated Contact with Patient 07/27/12 1927      Chief Complaint  Patient presents with  . Fall    HPI The patient was transported to the ED via EMS after he fell to the floor after trying to transport himself from the chair to the bed. The patient and his roommate to have a special needs. Patient himself has cerebral palsy.  Patient denies loss of consciousness. He denies any neck pain or back pain. Currently he denies any complaints and states he would like a coke and some crackers.  Patient has been seen many times in the past for similar events.   Past Medical History  Diagnosis Date  . Seizures   . Cerebral palsy   . Hypertension   . Mental retardation   . Depression   . Nephrolithiasis   . BPH (benign prostatic hyperplasia)   . GI bleed 2009    coffee ground emesis, erosive esophagitis on EGD by Dr Juanda Chance  . Esophageal stricture 2009    not dilated during the EGD, biopsy benign:  no Barretts  . Depression with anxiety     Past Surgical History  Procedure Date  . Basal cell carcinoma excision 2002    forehead  . Squamous cell carcinoma excision 2004    lip  . Bowen dz 2002    right cheek   . Esophagogastroduodenoscopy 06/14/2012    Procedure: ESOPHAGOGASTRODUODENOSCOPY (EGD);  Surgeon: Rachael Fee, MD;  Location: [facility] ENDOSCOPY;  Service: Endoscopy;  Laterality: N/A;    No family history on file.  History  Substance Use Topics  . Smoking status: Never Smoker   . Smokeless tobacco: Not on file  . Alcohol Use: No      Review of Systems  All other systems reviewed and are negative.    Allergies  Review of patient's allergies indicates no known allergies.  Home Medications   Current Outpatient Rx  Name Route Sig Dispense Refill  . AMITRIPTYLINE HCL 25 MG PO TABS Oral Take 25 mg by mouth at bedtime.     Marland Kitchen BISACODYL 5 MG PO TBEC Oral Take 1 tablet (5 mg total) by mouth 2  (two) times daily. 14 tablet 0  . CLONIDINE HCL 0.2 MG PO TABS Oral Take 0.2 mg by mouth at bedtime.     . DUTASTERIDE-TAMSULOSIN HCL 0.5-0.4 MG PO CAPS Oral Take 1 tablet by mouth daily.    . FENOFIBRATE 160 MG PO TABS Oral Take 160 mg by mouth 2 (two) times daily.     Marland Kitchen POLYSACCHARIDE IRON COMPLEX 150 MG PO CAPS Oral Take 150 mg by mouth 2 (two) times daily.    Marland Kitchen LACOSAMIDE 150 MG PO TABS Oral Take 150 mg by mouth 2 (two) times daily.    Marland Kitchen LORAZEPAM 1 MG PO TABS Oral Take 1 mg by mouth 3 (three) times daily.    . ADULT MULTIVITAMIN W/MINERALS CH Oral Take 1 tablet by mouth daily.    Marland Kitchen PANTOPRAZOLE SODIUM 40 MG PO TBEC Oral Take 40 mg by mouth 2 (two) times daily.     Marland Kitchen PRAZOSIN HCL 2 MG PO CAPS Oral Take 2 mg by mouth at bedtime.     . SUCRALFATE 1 GM/10ML PO SUSP Oral Take 1 g by mouth 2 (two) times daily.    . TRIAMCINOLONE 0.1 % CREAM:EUCERIN CREAM 1:1 Topical Apply 1 application topically 2 (two)  times daily as needed. For itching      BP 148/85  Pulse 78  Temp 98.1 F (36.7 C) (Oral)  Resp 16  SpO2 100%  Physical Exam  Nursing note and vitals reviewed. Constitutional: He appears well-developed and well-nourished. No distress.  HENT:  Head: Normocephalic and atraumatic.  Right Ear: External ear normal.  Left Ear: External ear normal.  Eyes: Conjunctivae normal are normal. Right eye exhibits no discharge. Left eye exhibits no discharge. No scleral icterus.  Neck: Neck supple. No tracheal deviation present.  Cardiovascular: Normal rate, regular rhythm and intact distal pulses.   Pulmonary/Chest: Effort normal and breath sounds normal. No stridor. No respiratory distress. He has no wheezes. He has no rales.  Abdominal: Soft. Bowel sounds are normal. He exhibits no distension. There is no tenderness. There is no rebound and no guarding.  Musculoskeletal: He exhibits no edema and no tenderness.       Bilateral upper extremities and lower extremities  were fully ranged and palpated,  there are no areas of tenderness or deformity, patient has no pain whatsoever  Neurological: He is alert. He has normal strength. No cranial nerve deficit ( no gross defecits noted) or sensory deficit. He exhibits normal muscle tone. He displays no seizure activity. Coordination abnormal.  Skin: Skin is warm and dry. No rash noted.  Psychiatric: He has a normal mood and affect.    ED Course  Procedures (including critical care time)  Labs Reviewed - No data to display No results found.   1. Fall       MDM  The patient is been seen many times in the past for similar events. He does have mental retardation and the patient does not have a great degree of insight into his medical problems. At this time there does not appear to be any evidence of an acute emergency medical condition and the patient appears stable for discharge with appropriate outpatient follow up.         Celene Kras, MD 07/27/12 2020

## 2012-07-27 NOTE — ED Notes (Signed)
Pt was AAAOx4. Pt able to urinate, given cracker and soda, and pt able to ask for ride home. Pt cleared from [employer] via physician. No trauma, no deformities, no crepitus noted.

## 2012-07-27 NOTE — ED Notes (Signed)
Pt calling call light for ride home and crackers. Pt A

## 2012-07-27 NOTE — ED Notes (Signed)
Pt came from home.   Pt has cerebral Palsy.  Pt found by EMS sitting in dark.  Pt lights are disconnected.  Pt and roommate is a part of ESP (extra special pple), Pt present to ED with c/o fall.  Per EMS, Pt was trying to transport from chair to bed.  Pt fall was unwittnessed.  Pt was locked in room per EMS.  Pt advised EMS that he was on floor for an hour.  Pt denies LOC. Pt denies trauma to head. Pt c/o back pain.  Pt present on immobilizer.  Pt vitals 136/80 HR 76, 18 resp.  100 percent RA.  Pt AAOX3

## 2012-08-12 ENCOUNTER — Emergency Department (HOSPITAL_COMMUNITY)
Admission: EM | Admit: 2012-08-12 | Discharge: 2012-08-12 | Disposition: A | Discharge status: Home / Self Care | Payer: Medicare Other | Attending: Emergency Medicine | Admitting: Emergency Medicine

## 2012-08-12 ENCOUNTER — Encounter (HOSPITAL_COMMUNITY): Payer: Self-pay | Admitting: Emergency Medicine

## 2012-08-12 DIAGNOSIS — F79 Unspecified intellectual disabilities: Secondary | ICD-10-CM | POA: Insufficient documentation

## 2012-08-12 DIAGNOSIS — Z23 Encounter for immunization: Secondary | ICD-10-CM | POA: Insufficient documentation

## 2012-08-12 DIAGNOSIS — L8990 Pressure ulcer of unspecified site, unspecified stage: Secondary | ICD-10-CM | POA: Insufficient documentation

## 2012-08-12 DIAGNOSIS — Z79899 Other long term (current) drug therapy: Secondary | ICD-10-CM | POA: Insufficient documentation

## 2012-08-12 DIAGNOSIS — Z87442 Personal history of urinary calculi: Secondary | ICD-10-CM | POA: Insufficient documentation

## 2012-08-12 DIAGNOSIS — L8991 Pressure ulcer of unspecified site, stage 1: Secondary | ICD-10-CM | POA: Insufficient documentation

## 2012-08-12 DIAGNOSIS — F341 Dysthymic disorder: Secondary | ICD-10-CM | POA: Insufficient documentation

## 2012-08-12 DIAGNOSIS — Z993 Dependence on wheelchair: Secondary | ICD-10-CM | POA: Insufficient documentation

## 2012-08-12 DIAGNOSIS — G809 Cerebral palsy, unspecified: Secondary | ICD-10-CM | POA: Insufficient documentation

## 2012-08-12 DIAGNOSIS — W050XXA Fall from non-moving wheelchair, initial encounter: Secondary | ICD-10-CM | POA: Insufficient documentation

## 2012-08-12 DIAGNOSIS — Z8669 Personal history of other diseases of the nervous system and sense organs: Secondary | ICD-10-CM | POA: Insufficient documentation

## 2012-08-12 DIAGNOSIS — G7109 Other specified muscular dystrophies: Secondary | ICD-10-CM | POA: Insufficient documentation

## 2012-08-12 DIAGNOSIS — T07XXXA Unspecified multiple injuries, initial encounter: Secondary | ICD-10-CM | POA: Insufficient documentation

## 2012-08-12 DIAGNOSIS — Z8719 Personal history of other diseases of the digestive system: Secondary | ICD-10-CM | POA: Insufficient documentation

## 2012-08-12 DIAGNOSIS — F0390 Unspecified dementia without behavioral disturbance: Secondary | ICD-10-CM | POA: Insufficient documentation

## 2012-08-12 DIAGNOSIS — Y9389 Activity, other specified: Secondary | ICD-10-CM | POA: Insufficient documentation

## 2012-08-12 DIAGNOSIS — I10 Essential (primary) hypertension: Secondary | ICD-10-CM | POA: Insufficient documentation

## 2012-08-12 DIAGNOSIS — Y92009 Unspecified place in unspecified non-institutional (private) residence as the place of occurrence of the external cause: Secondary | ICD-10-CM | POA: Insufficient documentation

## 2012-08-12 DIAGNOSIS — W19XXXA Unspecified fall, initial encounter: Secondary | ICD-10-CM

## 2012-08-12 DIAGNOSIS — S51809A Unspecified open wound of unspecified forearm, initial encounter: Secondary | ICD-10-CM | POA: Insufficient documentation

## 2012-08-12 DIAGNOSIS — N400 Enlarged prostate without lower urinary tract symptoms: Secondary | ICD-10-CM | POA: Insufficient documentation

## 2012-08-12 MED ORDER — BACITRACIN ZINC 500 UNIT/GM EX OINT
TOPICAL_OINTMENT | CUTANEOUS | Status: AC
  Administered 2012-08-12: 1
  Filled 2012-08-12: qty 2.7

## 2012-08-12 MED ORDER — TETANUS-DIPHTH-ACELL PERTUSSIS 5-2.5-18.5 LF-MCG/0.5 IM SUSP
0.5000 mL | Freq: Once | INTRAMUSCULAR | Status: AC
  Administered 2012-08-12: 0.5 mL via INTRAMUSCULAR
  Filled 2012-08-12: qty 0.5

## 2012-08-12 NOTE — ED Notes (Signed)
PER EMS- pt picked up from home. Pt lives at home with another man, both slightly MR.  Speech incomprehensible.  C/o fall from wheelchair.  Pt reports that he was reaching down to plug in wheelchair when he slide down wheelchair.  EMS reports it looked as if pt was wedged between night stand and bed.  Pt's med alert alerted EMS.  Pt unaware of last meal.  EMS concerned about pt ability to independently care for himself at home.   Possible consult to social work.  No head injury and pt denies any pain.  No visible deformities noted.

## 2012-08-12 NOTE — Progress Notes (Addendum)
CSW met with pt at beside to assess pt home situation. Pt shared that he lives in apartment and is wheelchair bound. Pt states that he is able to go to the grocery store, with his motorized wheelchair but only likes to eat Hamburgers. {t states, "I'm ready to go home and go to bed." Pt refused resources to help with meals including meals on wheels. Pt agreed for a Child psychotherapist from APS to visit patient at home to assess pt needs. The concern for pt at home is in regards to pt frequent visits to the ED due to pt frequents falls from wheelchair. Pt states he has a seat belt on wheelchair but sometimes doesn't use it.   Pt is currently seen by a home health agency however pt is unsure of the company name. Pt was seen by [facility] and was told by room mate that he sees [facility].   CSW spoke with [facility]PS, awaiting case worker to call this Clinical research associate back.   [phone]([phone] number for [place] [facility])   .Doree Albee  [phone] .08/12/2012 7:07pm   CSW spoke with APS case worker who received intake information for patient. APS plans to place a home visit for patient.   Catha Gosselin, LCSWA  [phone] .08/12/2012 19:07pm

## 2012-08-12 NOTE — Progress Notes (Signed)
[facility] ED Cm noted another ED visit for fall from w/c, total ED visits 19 in last 6 months Cm saw pt on 06/29/12 and home health set through [facility] Pt states he is receiving visits Cm & pt discussed pt using his seatbelt on his w/c to decrease or prevent further falls Cm consulted by EDP, Jacubowitz for d/c planning CM updated him on CM's visit with pt for this ED visit Recommendation: Referral to SW for possible community resources ([facility]) upon d/c Cm spoke with Tamela Oddi at [facility] [phone]) at 1850 and informed pt was seen on 06/30/12 but his roommate informed Home health staff pt is followed by [facility] and refused further services

## 2012-08-12 NOTE — Progress Notes (Signed)
Cm unable to reach [facility] staff after 5 pm

## 2012-08-12 NOTE — ED Notes (Signed)
Pt alert and oriented to person and place.  Not oriented to time.

## 2012-08-12 NOTE — ED Provider Notes (Signed)
History     CSN: [id number]  Arrival date & time 08/12/12  1627   First MD Initiated Contact with Patient 08/12/12 1816      Chief Complaint  Patient presents with  . Fall    (Consider location/radiation/quality/duration/timing/severity/associated sxs/prior treatment) HPI Brought via EMS patient reports fell out of his wheelchair today. He was wedged between his nightstand and bed. He denies pain anywhere. No treatment prior to coming here. Patient reports he has a motorized wheelchair. No treatment prior to coming here Past Medical History  Diagnosis Date  . Seizures   . Cerebral palsy   . Hypertension   . Mental retardation   . Depression   . Nephrolithiasis   . BPH (benign prostatic hyperplasia)   . GI bleed 2009    coffee ground emesis, erosive esophagitis on EGD by Dr Juanda Chance  . Esophageal stricture 2009    not dilated during the EGD, biopsy benign:  no Barretts  . Depression with anxiety     Past Surgical History  Procedure Date  . Basal cell carcinoma excision 2002    forehead  . Squamous cell carcinoma excision 2004    lip  . Bowen dz 2002    right cheek   . Esophagogastroduodenoscopy 06/14/2012    Procedure: ESOPHAGOGASTRODUODENOSCOPY (EGD);  Surgeon: Rachael Fee, MD;  Location: [facility] ENDOSCOPY;  Service: Endoscopy;  Laterality: N/A;    No family history on file.  History  Substance Use Topics  . Smoking status: Never Smoker   . Smokeless tobacco: Not on file  . Alcohol Use: No      Review of Systems  Unable to perform ROS: Dementia  Constitutional: Negative.   HENT: Negative.   Respiratory: Negative.   Cardiovascular: Negative.   Gastrointestinal: Negative.   Musculoskeletal: Negative.   Skin: Negative.   Neurological: Negative.        Wheelchair-bound  Hematological: Negative.   Psychiatric/Behavioral: Negative.     Allergies  Review of patient's allergies indicates no known allergies.  Home Medications   Current Outpatient Rx    Name  Route  Sig  Dispense  Refill  . AMITRIPTYLINE HCL 25 MG PO TABS   Oral   Take 25 mg by mouth at bedtime.          Marland Kitchen BISACODYL 5 MG PO TBEC   Oral   Take 1 tablet (5 mg total) by mouth 2 (two) times daily.   14 tablet   0   . CLONIDINE HCL 0.2 MG PO TABS   Oral   Take 0.2 mg by mouth at bedtime.          . DUTASTERIDE-TAMSULOSIN HCL 0.5-0.4 MG PO CAPS   Oral   Take 1 tablet by mouth daily.         . FENOFIBRATE 160 MG PO TABS   Oral   Take 160 mg by mouth 2 (two) times daily.          Marland Kitchen POLYSACCHARIDE IRON COMPLEX 150 MG PO CAPS   Oral   Take 150 mg by mouth 2 (two) times daily.         Marland Kitchen LACOSAMIDE 150 MG PO TABS   Oral   Take 150 mg by mouth 2 (two) times daily.         Marland Kitchen LORAZEPAM 1 MG PO TABS   Oral   Take 1 mg by mouth 3 (three) times daily.         . ADULT MULTIVITAMIN W/MINERALS [facility]  Oral   Take 1 tablet by mouth daily.         Marland Kitchen PANTOPRAZOLE SODIUM 40 MG PO TBEC   Oral   Take 40 mg by mouth 2 (two) times daily.          Marland Kitchen PRAZOSIN HCL 2 MG PO CAPS   Oral   Take 2 mg by mouth at bedtime.          . SUCRALFATE 1 GM/10ML PO SUSP   Oral   Take 1 g by mouth 2 (two) times daily.         . TRIAMCINOLONE 0.1 % CREAM:EUCERIN CREAM 1:1   Topical   Apply 1 application topically 2 (two) times daily as needed. For itching           BP 138/83  Pulse 81  Temp 97.5 F (36.4 C) (Oral)  Resp 18  Physical Exam  Nursing note and vitals reviewed. Constitutional: He appears well-developed.       Cachectic alert appropriate pleasant  HENT:  Head: Normocephalic and atraumatic.  Eyes: Conjunctivae normal are normal. Pupils are equal, round, and reactive to light.  Neck: Neck supple. No tracheal deviation present. No thyromegaly present.  Cardiovascular: Normal rate and regular rhythm.   No murmur heard. Pulmonary/Chest: Effort normal and breath sounds normal.  Abdominal: Soft. Bowel sounds are normal. He exhibits no distension.  There is no tenderness.  Musculoskeletal: Normal range of motion. He exhibits no edema and no tenderness.       Bilateral lower tenderness with muscular atrophy  Neurological: He is alert. Coordination normal.  Skin: Skin is warm and dry. No rash noted.       3 cm skin tear at left forearm. Dime sized healing abrasion right shin. Stage I superficial dime sized decubitus ulcer and presyncopal area  Psychiatric: He has a normal mood and affect.    ED Course  Procedures (including critical care time)  Labs Reviewed - No data to display No results found.   No diagnosis found.   Social work called to evaluate patient  MDM  Patient wishes to go home.  Social work evaluate patient. Adult Protective Services will visit patient in his home Diagnosis #1 fall #2 contusions multiple sites #3 decubitus ulcer        Doug Sou, MD 08/12/12 2013

## 2012-08-12 NOTE — ED Notes (Signed)
[id number]> Expected date:<BR> Expected time:<BR> Means of arrival:<BR> Comments:<BR> Fall from wc

## 2012-08-12 NOTE — ED Notes (Signed)
Ptar notified regarding pt transport back home.  En route

## 2012-08-13 ENCOUNTER — Other Ambulatory Visit: Payer: Self-pay | Admitting: *Deleted

## 2012-08-13 MED ORDER — PANTOPRAZOLE SODIUM 40 MG PO TBEC: 40.0000 mg | DELAYED_RELEASE_TABLET | Freq: Two times a day (BID) | ORAL | Status: DC

## 2013-07-05 ENCOUNTER — Emergency Department (HOSPITAL_COMMUNITY): Payer: Medicare Other

## 2013-07-05 ENCOUNTER — Encounter (HOSPITAL_COMMUNITY): Payer: Self-pay | Admitting: Emergency Medicine

## 2013-07-05 ENCOUNTER — Emergency Department (HOSPITAL_COMMUNITY)
Admission: EM | Admit: 2013-07-05 | Discharge: 2013-07-06 | Disposition: A | Discharge status: Skilled Nursing Facility | Payer: Medicare Other | Attending: Emergency Medicine | Admitting: Emergency Medicine

## 2013-07-05 DIAGNOSIS — I10 Essential (primary) hypertension: Secondary | ICD-10-CM | POA: Insufficient documentation

## 2013-07-05 DIAGNOSIS — F341 Dysthymic disorder: Secondary | ICD-10-CM | POA: Insufficient documentation

## 2013-07-05 DIAGNOSIS — R4182 Altered mental status, unspecified: Secondary | ICD-10-CM | POA: Insufficient documentation

## 2013-07-05 DIAGNOSIS — G40909 Epilepsy, unspecified, not intractable, without status epilepticus: Secondary | ICD-10-CM | POA: Insufficient documentation

## 2013-07-05 DIAGNOSIS — Z87442 Personal history of urinary calculi: Secondary | ICD-10-CM | POA: Insufficient documentation

## 2013-07-05 DIAGNOSIS — Z79899 Other long term (current) drug therapy: Secondary | ICD-10-CM | POA: Insufficient documentation

## 2013-07-05 DIAGNOSIS — K222 Esophageal obstruction: Secondary | ICD-10-CM | POA: Insufficient documentation

## 2013-07-05 DIAGNOSIS — Z87448 Personal history of other diseases of urinary system: Secondary | ICD-10-CM | POA: Insufficient documentation

## 2013-07-05 DIAGNOSIS — F79 Unspecified intellectual disabilities: Secondary | ICD-10-CM | POA: Insufficient documentation

## 2013-07-05 LAB — COMPREHENSIVE METABOLIC PANEL
CO2: 25 mEq/L (ref 19–32)
Calcium: 9.6 mg/dL (ref 8.4–10.5)
Creatinine, Ser: 1.17 mg/dL (ref 0.50–1.35)
GFR calc Af Amer: 75 mL/min — ABNORMAL LOW (ref >90)
GFR calc non Af Amer: 65 mL/min — ABNORMAL LOW (ref >90)
Glucose, Bld: 84 mg/dL (ref 70–99)
Sodium: 136 mEq/L (ref 135–145)
Total Protein: 6.8 g/dL (ref 6.0–8.3)

## 2013-07-05 LAB — CBC WITH DIFFERENTIAL/PLATELET
HCT: 36.3 % — ABNORMAL LOW (ref 39.0–52.0)
Hemoglobin: 12.7 g/dL — ABNORMAL LOW (ref 13.0–17.0)
Lymphocytes Relative: 39 % (ref 12–46)
Lymphs Abs: 1.7 10*3/uL (ref 0.7–4.0)
Monocytes Absolute: 0.5 10*3/uL (ref 0.1–1.0)
Monocytes Relative: 12 % (ref 3–12)
Neutro Abs: 1.7 10*3/uL (ref 1.7–7.7)
Neutrophils Relative %: 40 % — ABNORMAL LOW (ref 43–77)
RBC: 3.92 MIL/uL — ABNORMAL LOW (ref 4.22–5.81)

## 2013-07-05 NOTE — ED Notes (Signed)
PER PTAR- pt picked up from [facility] with c/o altered mental status. Staff reports they "can't understand pt tonight."  Pt hx of cerebral palsy and MR.

## 2013-07-05 NOTE — ED Notes (Signed)
Bed: RU04 Expected date:  Expected time:  Means of arrival:  Comments: EMS Altered Mental Status

## 2013-07-05 NOTE — ED Notes (Signed)
PA at bedside.

## 2013-07-06 LAB — URINALYSIS, ROUTINE W REFLEX MICROSCOPIC
Glucose, UA: NEGATIVE mg/dL
Leukocytes, UA: NEGATIVE
Protein, ur: NEGATIVE mg/dL
Specific Gravity, Urine: 1.013 (ref 1.005–1.030)
pH: 5.5 (ref 5.0–8.0)

## 2013-07-06 NOTE — ED Notes (Signed)
[facility] called for transport.  

## 2013-07-06 NOTE — ED Provider Notes (Signed)
The patient received in checkout from Dr. Karma Ganja  Urinalysis is negative. Repeat vital signs are normal.  There is no evidence for acute toxic or metabolic processes. He appears to be at his baseline.  Plan: Continue usual treatments at his nursing care facility  Flint Melter, MD 07/06/13 1610

## 2013-07-06 NOTE — ED Provider Notes (Signed)
CSN: [id number]     Arrival date & time 07/05/13  2006 History   First MD Initiated Contact with Patient 07/05/13 2022     Chief Complaint  Patient presents with  . Altered Mental Status   (Consider location/radiation/quality/duration/timing/severity/associated sxs/prior Treatment) HPI A LEVEL 5 CAVEAT PERTAINS DUE TO POOR HISTORIAN/MENTAL RETARDATION Pt presents from his nursing facility with report that staff couldn't understand his speech as well as usual.  Pt denies any current complaints.  He has hx of MR and CP.    Past Medical History  Diagnosis Date  . Seizures   . Cerebral palsy   . Hypertension   . Mental retardation   . Depression   . Nephrolithiasis   . BPH (benign prostatic hyperplasia)   . GI bleed 2009    coffee ground emesis, erosive esophagitis on EGD by Dr Juanda Chance  . Esophageal stricture 2009    not dilated during the EGD, biopsy benign:  no Barretts  . Depression with anxiety    Past Surgical History  Procedure Laterality Date  . Basal cell carcinoma excision  2002    forehead  . Squamous cell carcinoma excision  2004    lip  . Bowen dz  2002    right cheek   . Esophagogastroduodenoscopy  06/14/2012    Procedure: ESOPHAGOGASTRODUODENOSCOPY (EGD);  Surgeon: Rachael Fee, MD;  Location: [facility] ENDOSCOPY;  Service: Endoscopy;  Laterality: N/A;   History reviewed. No pertinent family history. History  Substance Use Topics  . Smoking status: Never Smoker   . Smokeless tobacco: Not on file  . Alcohol Use: No    Review of Systems UNABLE TO OBTAIN ROS DUE TO LEVEL 5 CAVEAT  Allergies  Review of patient's allergies indicates no known allergies.  Home Medications   Current Outpatient Rx  Name  Route  Sig  Dispense  Refill  . amitriptyline (ELAVIL) 25 MG tablet   Oral   Take 25 mg by mouth at bedtime.          . bisacodyl (DULCOLAX) 5 MG EC tablet   Oral   Take 1 tablet (5 mg total) by mouth 2 (two) times daily.   14 tablet   0   . cloNIDine  (CATAPRES) 0.2 MG tablet   Oral   Take 0.2 mg by mouth at bedtime.          . Dutasteride-Tamsulosin HCl (JALYN) 0.5-0.4 MG CAPS   Oral   Take 1 tablet by mouth daily.         Marland Kitchen escitalopram (LEXAPRO) 20 MG tablet   Oral   Take 20 mg by mouth daily.         . fenofibrate 160 MG tablet   Oral   Take 160 mg by mouth 2 (two) times daily.          . Lacosamide (VIMPAT) 150 MG TABS   Oral   Take 150 mg by mouth 2 (two) times daily.         Marland Kitchen LORazepam (ATIVAN) 1 MG tablet   Oral   Take 1 mg by mouth 3 (three) times daily.         . Multiple Vitamin (MULTIVITAMIN WITH MINERALS) TABS   Oral   Take 1 tablet by mouth daily.         . pantoprazole (PROTONIX) 40 MG tablet   Oral   Take 1 tablet (40 mg total) by mouth 2 (two) times daily.   60 tablet   2   .  prazosin (MINIPRESS) 2 MG capsule   Oral   Take 2 mg by mouth at bedtime.          . sucralfate (CARAFATE) 1 GM/10ML suspension   Oral   Take 1 g by mouth 2 (two) times daily.         . Triamcinolone Acetonide (TRIAMCINOLONE 0.1 % CREAM : EUCERIN) CREA   Topical   Apply 1 application topically 2 (two) times daily as needed. For itching          BP 142/104  Pulse 83  Temp(Src) 97.6 F (36.4 C) (Oral)  Resp 16  SpO2 96% Vitals reviewed Physical Exam Physical Examination: General appearance - alert, well appearing, and in no distress Mental status - alert, oriented to person, place, and time Eyes - pupils equal and reactive, extraocular eye movements intact Mouth - mucous membranes moist, pharynx normal without lesions Chest - clear to auscultation, no wheezes, rales or rhonchi, symmetric air entry Heart - normal rate, regular rhythm, normal S1, S2, no murmurs, rubs, clicks or gallops Abdomen - soft, nontender, nondistended, no masses or organomegaly Neurological - alert, oriented, slightly slurred speech, cranial nerves grossly intact, strength intact in extremities, increased tone of lower  extremities which is his baseline Extremities - peripheral pulses normal, no pedal edema, no clubbing or cyanosis Skin - normal coloration and turgor, no rashes  ED Course  Procedures (including critical care time) Labs Review Labs Reviewed  COMPREHENSIVE METABOLIC PANEL - Abnormal; Notable for the following:    AST 50 (*)    GFR calc non Af Amer 65 (*)    GFR calc Af Amer 75 (*)    All other components within normal limits  CBC WITH DIFFERENTIAL - Abnormal; Notable for the following:    RBC 3.92 (*)    Hemoglobin 12.7 (*)    HCT 36.3 (*)    Neutrophils Relative % 40 (*)    Eosinophils Relative 9 (*)    All other components within normal limits  TROPONIN I  URINALYSIS, ROUTINE W REFLEX MICROSCOPIC   Imaging Review Ct Head Wo Contrast  07/05/2013   CLINICAL DATA:  Altered mental status.  EXAM: CT HEAD WITHOUT CONTRAST  TECHNIQUE: Contiguous axial images were obtained from the base of the skull through the vertex without intravenous contrast.  COMPARISON:  04/16/2012  FINDINGS: Mild atrophy and chronic microvascular changes. Prominent calcifications in the basal ganglia, stable. Calcification within the pons, stable. No acute infarction. No hemorrhage or hydrocephalus. No extra-axial fluid collection. No acute calvarial abnormality.  IMPRESSION: Stable chronic changes as above. No acute findings.   Electronically Signed   By: Charlett Nose M.D.   On: 07/05/2013 22:15    MDM   1. Altered mental state    Pt with hx of MR/CP presenting with concern for altered mental status.  Pt is awake and alert on my exam, he denies any complaints, his speech is clear.  He is requesting to go home.  He is eating crackers.  Workup including labs and head CT are reassuring.  Awaiting urine sample.  If this is negative as well patient may be discharged back to his facility.  Pt signed out at end of shift pending urine.      Ethelda Chick, MD 07/06/13 [phone]

## 2014-06-16 ENCOUNTER — Other Ambulatory Visit (HOSPITAL_COMMUNITY): Payer: Medicare Other

## 2014-06-16 NOTE — Pre-Procedure Instructions (Addendum)
Colin Bradley  06/16/2014   Your procedure is scheduled on: Monday, September 14.              Report to [facility] Admitting at  9:00AM.  Call this number if you have problems the morning of surgery: [phone]   Remember:   Do not eat food or drink liquids after midnight Sunday, September 13.   Take these medicines the morning of surgery with A SIP OF WATER: cloNIDine (CATAPRES), escitalopram (LEXAPRO), LORazepam (ATIVAN), pantoprazole (PROTONIX).      Do not wear jewelry, make-up or nail polish.  Do not wear lotions, powders, or perfumes.   Men may shave face and neck.  Do not bring valuables to the hospital.           [facility] is not responsible  for any belongings or valuables.               Contacts, dentures or bridgework may not be worn into surgery.  Leave suitcase in the car. After surgery it may be brought to your room.  For patients admitted to the hospital, discharge time is determined by your treatment team.               Patients discharged the day of surgery will not be allowed to drive home.  Name and phone number of your driver: -   Special Instructions: -   Please read over the following fact sheets that you were given: Pain Booklet, Coughing and Deep Breathing and Surgical Site Infection Prevention  [facility] - Preparing for Surgery  Before surgery, you can play an important role.  Because skin is not sterile, your skin needs to be as free of germs as possible.  You can reduce the number of germs on you skin by washing with CHG (chlorahexidine gluconate) soap before surgery.  CHG is an antiseptic cleaner which kills germs and bonds with the skin to continue killing germs even after washing.  Please DO NOT use if you have an allergy to CHG or antibacterial soaps.  If your skin becomes reddened/irritated stop using the CHG and inform your nurse when you arrive at Short Stay.  Do not shave (including legs and underarms) for at least 48 hours  prior to the first CHG shower.  You may shave your face.  Please follow these instructions carefully:   1.  Shower with CHG Soap the night before surgery and the morning of Surgery.  2.  If you choose to wash your hair, wash your hair first as usual with your normal shampoo.  3.  After you shampoo, rinse your hair and body thoroughly to remove the shampoo.  4.  Use CHG as you would any other liquid soap.  You can apply CHG directly to the skin and wash gently with scrungie or a clean washcloth.  5.  Apply the CHG Soap to your body ONLY FROM THE NECK DOWN.  Do not use on open wounds or open sores.  Avoid contact with your eyes, ears, mouth and genitals (private parts).  Wash genitals (private parts) with your normal soap.  6.  Wash thoroughly, paying special attention to the area where your surgery will be performed.  7.  Thoroughly rinse your body with warm water from the neck down.  8.  DO NOT shower/wash with your normal soap after using and rinsing off the CHG Soap.  9.  Pat yourself dry with a clean towel.  10.  Wear clean pajamas.            11.  Place clean sheets on your bed the night of your first shower and do not sleep with pets.  Day of Surgery  Do not apply any lotions/deoderants the morning of surgery.  Please wear clean clothes to the hospital/surgery center.

## 2014-06-16 NOTE — H&P (Signed)
HISTORY AND PHYSICAL  ERBY Bradley is a 63 y.o. male patient referred Colin his general dentist for extraction of grossly carious teeth.  No diagnosis found.  Past Medical History  Diagnosis Date  . Seizures   . Cerebral palsy   . Hypertension   . Mental retardation   . Depression   . Nephrolithiasis   . BPH (benign prostatic hyperplasia)   . GI bleed 2009    coffee ground emesis, erosive esophagitis on EGD Colin Dr Juanda Chance  . Esophageal stricture 2009    not dilated during the EGD, biopsy benign:  no Barretts  . Depression with anxiety     No current facility-administered medications for this encounter.   Current Outpatient Prescriptions  Medication Sig Dispense Refill  . amitriptyline (ELAVIL) 25 MG tablet Take 25 mg Colin mouth at bedtime.       . bisacodyl (DULCOLAX) 5 MG EC tablet Take 1 tablet (5 mg total) Colin mouth 2 (two) times daily.  14 tablet  0  . cloNIDine (CATAPRES) 0.2 MG tablet Take 0.2 mg Colin mouth at bedtime.       . Dutasteride-Tamsulosin HCl (JALYN) 0.5-0.4 MG CAPS Take 1 tablet Colin mouth daily.      Marland Kitchen escitalopram (LEXAPRO) 20 MG tablet Take 20 mg Colin mouth daily.      . fenofibrate 160 MG tablet Take 160 mg Colin mouth 2 (two) times daily.       . Lacosamide (VIMPAT) 150 MG TABS Take 150 mg Colin mouth 2 (two) times daily.      Marland Kitchen LORazepam (ATIVAN) 1 MG tablet Take 1 mg Colin mouth 3 (three) times daily.      . Multiple Vitamin (MULTIVITAMIN WITH MINERALS) TABS Take 1 tablet Colin mouth daily.      . pantoprazole (PROTONIX) 40 MG tablet Take 1 tablet (40 mg total) Colin mouth 2 (two) times daily.  60 tablet  2  . prazosin (MINIPRESS) 2 MG capsule Take 2 mg Colin mouth at bedtime.       . sucralfate (CARAFATE) 1 GM/10ML suspension Take 1 g Colin mouth 2 (two) times daily.      . Triamcinolone Acetonide (TRIAMCINOLONE 0.1 % CREAM : EUCERIN) CREA Apply 1 application topically 2 (two) times daily as needed. For itching      . [DISCONTINUED] doxazosin (CARDURA) 8 MG tablet Take 8 mg Colin mouth  at bedtime.        . [DISCONTINUED] phenytoin (DILANTIN) 200 MG ER capsule Take 200 mg Colin mouth 2 (two) times daily.         No Known Allergies Active Problems:   * No active hospital problems. *  Vitals: There were no vitals taken for this visit. Lab results:No results found for this or any previous visit (from the past 24 hour(s)). Radiology Results: No results found. General appearance: cooperative and slowed mentation Head: Normocephalic, without obvious abnormality, atraumatic Eyes: negative Nose: Nares normal. Septum midline. Mucosa normal. No drainage or sinus tenderness. Throat: multiple dental caries teeth # 2, 10, 12, 18, 21, 22, 29, 32; pharynx clear Neck: no adenopathy, supple, symmetrical, trachea midline and thyroid not enlarged, symmetric, no tenderness/mass/nodules Resp: clear to auscultation bilaterally Cardio: regular rate and rhythm, S1, S2 normal, no murmur, click, rub or gallop Extremities: paralysis lower extremities  Assessment:Non-restorable teeth # 2, 10, 12, 18, 21, 22, 29, 32  Plan: Dental extractions. GA. Day surgery.   Colin Bradley M 06/16/2014

## 2014-06-17 ENCOUNTER — Encounter (HOSPITAL_COMMUNITY)
Admission: RE | Admit: 2014-06-17 | Discharge: 2014-06-17 | Disposition: A | Discharge status: Home / Self Care | Payer: Medicare Other | Source: Ambulatory Visit | Attending: Oral Surgery | Admitting: Oral Surgery

## 2014-06-17 ENCOUNTER — Encounter (HOSPITAL_COMMUNITY): Payer: Self-pay

## 2014-06-17 ENCOUNTER — Ambulatory Visit (HOSPITAL_COMMUNITY)
Admission: RE | Admit: 2014-06-17 | Discharge: 2014-06-17 | Disposition: A | Discharge status: Home / Self Care | Payer: Medicare Other | Source: Ambulatory Visit | Attending: Anesthesiology | Admitting: Anesthesiology

## 2014-06-17 DIAGNOSIS — R569 Unspecified convulsions: Secondary | ICD-10-CM | POA: Diagnosis not present

## 2014-06-17 DIAGNOSIS — I10 Essential (primary) hypertension: Secondary | ICD-10-CM | POA: Diagnosis not present

## 2014-06-17 LAB — BASIC METABOLIC PANEL
Anion gap: 12 (ref 5–15)
BUN: 18 mg/dL (ref 6–23)
CO2: 23 mEq/L (ref 19–32)
CREATININE: 1.13 mg/dL (ref 0.50–1.35)
Calcium: 9 mg/dL (ref 8.4–10.5)
Chloride: 106 mEq/L (ref 96–112)
GFR, EST AFRICAN AMERICAN: 78 mL/min — AB (ref >90)
GFR, EST NON AFRICAN AMERICAN: 67 mL/min — AB (ref >90)
GLUCOSE: 91 mg/dL (ref 70–99)
Potassium: 3.7 mEq/L (ref 3.7–5.3)
Sodium: 141 mEq/L (ref 137–147)

## 2014-06-17 LAB — CBC
HCT: 33.6 % — ABNORMAL LOW (ref 39.0–52.0)
HEMOGLOBIN: 11.1 g/dL — AB (ref 13.0–17.0)
MCH: 31.1 pg (ref 26.0–34.0)
MCHC: 33 g/dL (ref 30.0–36.0)
MCV: 94.1 fL (ref 78.0–100.0)
PLATELETS: 212 10*3/uL (ref 150–400)
RBC: 3.57 MIL/uL — ABNORMAL LOW (ref 4.22–5.81)
RDW: 14.6 % (ref 11.5–15.5)
WBC: 4.8 10*3/uL (ref 4.0–10.5)

## 2014-06-17 NOTE — Progress Notes (Signed)
Primary - dr [facility] and dr. Evlyn Kanner

## 2014-06-19 MED ORDER — CEFAZOLIN SODIUM-DEXTROSE 2-3 GM-% IV SOLR
2.0000 g | INTRAVENOUS | Status: AC
Start: 2014-06-20 — End: 2014-06-20
  Administered 2014-06-20: 2 g via INTRAVENOUS
  Filled 2014-06-19: qty 50

## 2014-06-20 ENCOUNTER — Encounter (HOSPITAL_COMMUNITY): Payer: Medicare Other | Admitting: Vascular Surgery

## 2014-06-20 ENCOUNTER — Ambulatory Visit (HOSPITAL_COMMUNITY)
Admission: RE | Admit: 2014-06-20 | Discharge: 2014-06-20 | Disposition: A | Discharge status: Home / Self Care | Payer: Medicare Other | Source: Ambulatory Visit | Attending: Oral Surgery | Admitting: Oral Surgery

## 2014-06-20 ENCOUNTER — Encounter (HOSPITAL_COMMUNITY): Payer: Self-pay | Admitting: Anesthesiology

## 2014-06-20 ENCOUNTER — Ambulatory Visit (HOSPITAL_COMMUNITY): Payer: Medicare Other | Admitting: Anesthesiology

## 2014-06-20 ENCOUNTER — Encounter (HOSPITAL_COMMUNITY)
Admission: RE | Disposition: A | Discharge status: Home / Self Care | Payer: Self-pay | Source: Ambulatory Visit | Attending: Oral Surgery

## 2014-06-20 DIAGNOSIS — N400 Enlarged prostate without lower urinary tract symptoms: Secondary | ICD-10-CM | POA: Insufficient documentation

## 2014-06-20 DIAGNOSIS — I10 Essential (primary) hypertension: Secondary | ICD-10-CM | POA: Insufficient documentation

## 2014-06-20 DIAGNOSIS — K0530 Chronic periodontitis, unspecified: Secondary | ICD-10-CM

## 2014-06-20 DIAGNOSIS — R195 Other fecal abnormalities: Secondary | ICD-10-CM

## 2014-06-20 DIAGNOSIS — K209 Esophagitis, unspecified without bleeding: Secondary | ICD-10-CM

## 2014-06-20 DIAGNOSIS — R569 Unspecified convulsions: Secondary | ICD-10-CM | POA: Insufficient documentation

## 2014-06-20 DIAGNOSIS — F3289 Other specified depressive episodes: Secondary | ICD-10-CM | POA: Insufficient documentation

## 2014-06-20 DIAGNOSIS — K029 Dental caries, unspecified: Secondary | ICD-10-CM

## 2014-06-20 DIAGNOSIS — D649 Anemia, unspecified: Secondary | ICD-10-CM | POA: Diagnosis not present

## 2014-06-20 DIAGNOSIS — G40909 Epilepsy, unspecified, not intractable, without status epilepticus: Secondary | ICD-10-CM

## 2014-06-20 DIAGNOSIS — G809 Cerebral palsy, unspecified: Secondary | ICD-10-CM | POA: Diagnosis not present

## 2014-06-20 DIAGNOSIS — Z993 Dependence on wheelchair: Secondary | ICD-10-CM | POA: Insufficient documentation

## 2014-06-20 DIAGNOSIS — F79 Unspecified intellectual disabilities: Secondary | ICD-10-CM | POA: Diagnosis not present

## 2014-06-20 DIAGNOSIS — K449 Diaphragmatic hernia without obstruction or gangrene: Secondary | ICD-10-CM

## 2014-06-20 DIAGNOSIS — F329 Major depressive disorder, single episode, unspecified: Secondary | ICD-10-CM | POA: Insufficient documentation

## 2014-06-20 DIAGNOSIS — Z87898 Personal history of other specified conditions: Secondary | ICD-10-CM

## 2014-06-20 HISTORY — PX: MULTIPLE EXTRACTIONS WITH ALVEOLOPLASTY: SHX5342

## 2014-06-20 SURGERY — MULTIPLE EXTRACTION WITH ALVEOLOPLASTY
Anesthesia: General

## 2014-06-20 MED ORDER — MEPERIDINE HCL 25 MG/ML IJ SOLN: 6.2500 mg | INTRAMUSCULAR | Status: DC | PRN

## 2014-06-20 MED ORDER — 0.9 % SODIUM CHLORIDE (POUR BTL) OPTIME
TOPICAL | Status: DC | PRN
Start: 2014-06-20 — End: 2014-06-20
  Administered 2014-06-20: 1000 mL

## 2014-06-20 MED ORDER — LIDOCAINE-EPINEPHRINE 2 %-1:100000 IJ SOLN
INTRAMUSCULAR | Status: AC
  Filled 2014-06-20: qty 1

## 2014-06-20 MED ORDER — GLYCOPYRROLATE 0.2 MG/ML IJ SOLN
INTRAMUSCULAR | Status: AC
  Filled 2014-06-20: qty 2

## 2014-06-20 MED ORDER — LACTATED RINGERS IV SOLN
INTRAVENOUS | Status: DC | PRN
  Administered 2014-06-20: 08:00:00 via INTRAVENOUS

## 2014-06-20 MED ORDER — ONDANSETRON HCL 4 MG/2ML IJ SOLN
INTRAMUSCULAR | Status: DC | PRN
  Administered 2014-06-20: 4 mg via INTRAVENOUS

## 2014-06-20 MED ORDER — DEXAMETHASONE SODIUM PHOSPHATE 4 MG/ML IJ SOLN
INTRAMUSCULAR | Status: DC | PRN
  Administered 2014-06-20: 4 mg via INTRAVENOUS

## 2014-06-20 MED ORDER — HYDROCODONE-ACETAMINOPHEN 5-325 MG PO TABS: 1.0000 | ORAL_TABLET | ORAL | Status: DC | PRN

## 2014-06-20 MED ORDER — ROCURONIUM BROMIDE 50 MG/5ML IV SOLN
INTRAVENOUS | Status: AC
  Filled 2014-06-20: qty 1

## 2014-06-20 MED ORDER — FENTANYL CITRATE 0.05 MG/ML IJ SOLN
INTRAMUSCULAR | Status: AC
  Filled 2014-06-20: qty 5

## 2014-06-20 MED ORDER — ARTIFICIAL TEARS OP OINT
TOPICAL_OINTMENT | OPHTHALMIC | Status: AC
  Filled 2014-06-20: qty 3.5

## 2014-06-20 MED ORDER — OXYMETAZOLINE HCL 0.05 % NA SOLN
NASAL | Status: DC | PRN
  Administered 2014-06-20: 5 via NASAL

## 2014-06-20 MED ORDER — PROPOFOL 10 MG/ML IV BOLUS
INTRAVENOUS | Status: DC | PRN
  Administered 2014-06-20: 80 mg via INTRAVENOUS

## 2014-06-20 MED ORDER — OXYMETAZOLINE HCL 0.05 % NA SOLN
NASAL | Status: DC | PRN
  Administered 2014-06-20: 2 via NASAL

## 2014-06-20 MED ORDER — ONDANSETRON HCL 4 MG/2ML IJ SOLN
INTRAMUSCULAR | Status: AC
  Filled 2014-06-20: qty 2

## 2014-06-20 MED ORDER — MIDAZOLAM HCL 5 MG/5ML IJ SOLN
INTRAMUSCULAR | Status: DC | PRN
  Administered 2014-06-20: 1 mg via INTRAVENOUS

## 2014-06-20 MED ORDER — ROCURONIUM BROMIDE 100 MG/10ML IV SOLN
INTRAVENOUS | Status: DC | PRN
  Administered 2014-06-20: 20 mg via INTRAVENOUS

## 2014-06-20 MED ORDER — FENTANYL CITRATE 0.05 MG/ML IJ SOLN
INTRAMUSCULAR | Status: DC | PRN
  Administered 2014-06-20 (×2): 50 ug via INTRAVENOUS

## 2014-06-20 MED ORDER — PROPOFOL 10 MG/ML IV BOLUS
INTRAVENOUS | Status: AC
  Filled 2014-06-20: qty 20

## 2014-06-20 MED ORDER — LIDOCAINE-EPINEPHRINE 2 %-1:100000 IJ SOLN
INTRAMUSCULAR | Status: DC | PRN
  Administered 2014-06-20: 20 mL via INTRADERMAL

## 2014-06-20 MED ORDER — MIDAZOLAM HCL 2 MG/2ML IJ SOLN
INTRAMUSCULAR | Status: AC
  Filled 2014-06-20: qty 2

## 2014-06-20 MED ORDER — NEOSTIGMINE METHYLSULFATE 10 MG/10ML IV SOLN
INTRAVENOUS | Status: AC
  Filled 2014-06-20: qty 1

## 2014-06-20 MED ORDER — NEOSTIGMINE METHYLSULFATE 10 MG/10ML IV SOLN
INTRAVENOUS | Status: DC | PRN
  Administered 2014-06-20: 3 mg via INTRAVENOUS

## 2014-06-20 MED ORDER — GLYCOPYRROLATE 0.2 MG/ML IJ SOLN
INTRAMUSCULAR | Status: DC | PRN
  Administered 2014-06-20: 0.4 mg via INTRAVENOUS

## 2014-06-20 MED ORDER — ARTIFICIAL TEARS OP OINT
TOPICAL_OINTMENT | OPHTHALMIC | Status: DC | PRN
  Administered 2014-06-20: 1 via OPHTHALMIC

## 2014-06-20 MED ORDER — LACTATED RINGERS IV SOLN: INTRAVENOUS | Status: DC

## 2014-06-20 MED ORDER — FENTANYL CITRATE 0.05 MG/ML IJ SOLN: 25.0000 ug | INTRAMUSCULAR | Status: DC | PRN

## 2014-06-20 MED ORDER — DEXMEDETOMIDINE HCL IN NACL 200 MCG/50ML IV SOLN: INTRAVENOUS | Status: DC | PRN

## 2014-06-20 MED ORDER — DEXMEDETOMIDINE HCL 200 MCG/2ML IV SOLN
INTRAVENOUS | Status: DC | PRN
  Administered 2014-06-20: 8 ug via INTRAVENOUS

## 2014-06-20 MED ORDER — SODIUM CHLORIDE 0.9 % IR SOLN
Status: DC | PRN
Start: 2014-06-20 — End: 2014-06-20
  Administered 2014-06-20: 1000 mL

## 2014-06-20 MED ORDER — LIDOCAINE HCL (CARDIAC) 20 MG/ML IV SOLN
INTRAVENOUS | Status: DC | PRN
  Administered 2014-06-20: 100 mg via INTRAVENOUS

## 2014-06-20 MED ORDER — PROMETHAZINE HCL 25 MG/ML IJ SOLN: 6.2500 mg | INTRAMUSCULAR | Status: DC | PRN

## 2014-06-20 SURGICAL SUPPLY — 30 items
BLADE 10 SAFETY STRL DISP (BLADE) ×3 IMPLANT
BUR CROSS CUT FISSURE 1.6 (BURR) ×2 IMPLANT
BUR CROSS CUT FISSURE 1.6MM (BURR) ×1
BUR EGG ELITE 4.0 (BURR) ×2 IMPLANT
BUR EGG ELITE 4.0MM (BURR) ×1
CANISTER SUCTION 2500CC (MISCELLANEOUS) ×3 IMPLANT
COVER SURGICAL LIGHT HANDLE (MISCELLANEOUS) ×3 IMPLANT
CRADLE DONUT ADULT HEAD (MISCELLANEOUS) ×3 IMPLANT
GAUZE PACKING FOLDED 2  STR (GAUZE/BANDAGES/DRESSINGS) ×2
GAUZE PACKING FOLDED 2 STR (GAUZE/BANDAGES/DRESSINGS) ×1 IMPLANT
GLOVE BIO SURGEON STRL SZ 6.5 (GLOVE) ×4 IMPLANT
GLOVE BIO SURGEON STRL SZ7.5 (GLOVE) ×3 IMPLANT
GLOVE BIO SURGEONS STRL SZ 6.5 (GLOVE) ×2
GLOVE BIOGEL PI IND STRL 7.0 (GLOVE) ×2 IMPLANT
GLOVE BIOGEL PI INDICATOR 7.0 (GLOVE) ×4
GOWN STRL REUS W/ TWL LRG LVL3 (GOWN DISPOSABLE) ×2 IMPLANT
GOWN STRL REUS W/ TWL XL LVL3 (GOWN DISPOSABLE) ×1 IMPLANT
GOWN STRL REUS W/TWL LRG LVL3 (GOWN DISPOSABLE) ×6
GOWN STRL REUS W/TWL XL LVL3 (GOWN DISPOSABLE) ×3
KIT BASIN OR (CUSTOM PROCEDURE TRAY) ×3 IMPLANT
KIT ROOM TURNOVER OR (KITS) ×3 IMPLANT
NEEDLE 22X1 1/2 (OR ONLY) (NEEDLE) ×3 IMPLANT
NS IRRIG 1000ML POUR BTL (IV SOLUTION) ×3 IMPLANT
PAD ARMBOARD 7.5X6 YLW CONV (MISCELLANEOUS) ×6 IMPLANT
SUT CHROMIC 3 0 PS 2 (SUTURE) ×6 IMPLANT
SYR CONTROL 10ML LL (SYRINGE) ×3 IMPLANT
TOWEL OR 17X26 10 PK STRL BLUE (TOWEL DISPOSABLE) ×3 IMPLANT
TRAY ENT MC OR (CUSTOM PROCEDURE TRAY) ×3 IMPLANT
TUBING IRRIGATION (MISCELLANEOUS) ×3 IMPLANT
YANKAUER SUCT BULB TIP NO VENT (SUCTIONS) ×3 IMPLANT

## 2014-06-20 NOTE — Discharge Instructions (Signed)
What to Eat after Tooth extraction:   ° ° °For your first meals, you should eat lightly; only small meals at first.   Avoid Sharp, Crunchy, and Hot foods.   If you do not have nausea, you may eat larger meals.  Avoid spicy, greasy and heavy food, as these may make you sick after the anesthesia.  °. ° °Sinus precautions after surgery:  °1. Avoid blowing your nose.  °2. Avoid sneezing. If you might sneeze, Keep her mouth open and do not pinch your nose closed.  °3. Avoid sucking on straws. Avoid smoking.  °4. Do not lift objects weighing more than 20 pounds  °Call if questions arise.  ° °MOUTH CARE AFTER SURGERY  °FACTS:  °Ice used in ice bag helps keep the swelling down, and can help lessen the pain.  °It is easier to treat pain BEFORE it happens.  °Spitting disturbs the clot and may cause bleeding to start again, or to get worse.  °Smoking delays healing and can cause complications.  °Sharing prescriptions can be dangerous. Do not take medications not recently prescribed for you.  °Antibiotics may stop birth control pills from working. Use other means of birth control while on antibiotics.  °Warm salt water rinses after the first 24 hours will help lessen the swelling: Use 1/2 teaspoonful of table salt per oz.of water. °DO NOT:  °Do not spit. Do not drink through a straw.  °Strongly advised not to smoke, dip snuff or chew tobacco at least for 3 days.  °Do not eat sharp or crunchy foods. Avoid the area of surgery when chewing.  °Do not stop your antibiotics before your instructions say to do so.  °Do not eat hot foods until bleeding has stopped. If you need to, let your food cool down to room temperature. °EXPECT:  °Some swelling, especially first 2-3 days.  °Soreness or discomfort in varying degrees. Follow your dentist's instructions about how to handle pain before it starts.  °Pinkish saliva or light blood in saliva, or on your pillow in the morning. This can last around 24 hours.  °Bruising inside or outside the  mouth. This may not show up until 2-3 days after surgery. Don't worry, it will go away in time.  °Pieces of "bone" may work themselves loose. It's OK. If they bother you, let us know. °WHAT TO DO IMMEDIATELY AFTER SURGERY:  °Bite on the gauze with steady pressure for 1-2 hours. Don't chew on the gauze.  °Do not lie down flat. Raise your head support especially for the first 24 hours.  °Apply ice to your face on the side of the surgery. You may apply it 20 minutes on and a few minutes off. Ice for 8-12 hours. You may use ice up to 24 hours.  °Before the numbness wears off, take a pain pill as instructed.  °Prescription pain medication is not always required. °SWELLING:  °Expect swelling for the first couple of days. It should get better after that.  °If swelling increases 3 days or so after surgery; let us know as soon as possible. °FEVER:  °Take Tylenol every 4 hours if needed to lower your temperature, especially if it is at 100F or higher.  °Drink lots of fluids.  °If the fever does not go away, let us know. °BREATHING TROUBLE:  °Any unusual difficulty breathing means you have to have someone bring you to the emergency room ASAP °BLEEDING:  °Light oozing is expected for 24 hours or so.  °Prop head up   with pillows  °Avoid spitting  °Do not confuse bright red fresh flowing blood with lots of saliva colored with a little bit of blood.  °If you notice some bleeding, place gauze or a tea bag where it is bleeding and apply CONSTANT pressure by biting down for 1 hour. Avoid talking during this time. Do not remove the gauze or tea bag during this hour to "check" the bleeding.  °If you notice bright RED bleeding FLOWING out of particular area, and filling the floor of your mouth, put a wad of gauze on that area, bite down firmly and constantly. Call us immediately. If we're closed, have someone bring you to the emergency room. °ORAL HYGIENE:  °Brush your teeth as usual after meals and before bedtime.  °Use a soft  toothbrush around the area of surgery.  °DO NOT AVOID BRUSHING. Otherwise bacteria(germs) will grow and may delay healing or encourage infection.  °Since you cannot spit, just gently rinse and let the water flow out of your mouth.  °DO NOT SWISH HARD. °EATING:  °Cool liquids are a good point to start. Increase to soft foods as tolerated. °PRESCRIPTIONS:  °Follow the directions for your prescriptions exactly as written.  °If your doctor gave you a narcotic pain medication, do not drive, operate machinery or drink alcohol when on that medication. °QUESTIONS:  °Call our office during office hours ° ° °

## 2014-06-20 NOTE — Anesthesia Postprocedure Evaluation (Signed)
  Anesthesia Post-op Note  Patient: Colin Bradley  Procedure(s) Performed: Procedure(s): MULTIPLE EXTRACTIONS TEETH NUMBER TWO, TEN, TWELVE, EIGHTEEN, TWENTY-ONE, TWENTY-TWO, TWENTY-NINE, THIRTY-TWO (N/A)  Patient Location: PACU  Anesthesia Type:General  Level of Consciousness: awake, alert  and oriented  Airway and Oxygen Therapy: Patient Spontanous Breathing and Patient connected to nasal cannula oxygen  Post-op Pain: mild  Post-op Assessment: Post-op Vital signs reviewed, Patient's Cardiovascular Status Stable, Respiratory Function Stable, Patent Airway and No signs of Nausea or vomiting  Post-op Vital Signs: Reviewed and stable  Last Vitals:  Filed Vitals:   06/20/14 1042  BP: 133/76  Pulse: 82  Temp:   Resp: 12    Complications: No apparent anesthesia complications

## 2014-06-20 NOTE — Anesthesia Preprocedure Evaluation (Addendum)
Anesthesia Evaluation  Patient identified by MRN, date of birth, ID band Patient awake    Reviewed: Allergy & Precautions, H&P , NPO status , Patient's Chart, lab work & pertinent test results  Airway       Dental   Pulmonary          Cardiovascular hypertension, Pt. on medications     Neuro/Psych Depression Cerebral Palsy, Mental retardationWheelchair bound.  Cannot use lower ext.  Care giver claims some type of muscular dystrophy.  Will avoid sux, try blind nasal intubation  Neuromuscular disease    GI/Hepatic   Endo/Other    Renal/GU Renal disease: creat 1.3.     Musculoskeletal   Abdominal   Peds  Hematology  (+) anemia , H/H 11/33   Anesthesia Other Findings   Reproductive/Obstetrics                        Anesthesia Physical Anesthesia Plan  ASA: II  Anesthesia Plan: General   Post-op Pain Management:    Induction: Intravenous  Airway Management Planned: Oral ETT  Additional Equipment:   Intra-op Plan:   Post-operative Plan: Extubation in OR  Informed Consent: I have reviewed the patients History and Physical, chart, labs and discussed the procedure including the risks, benefits and alternatives for the proposed anesthesia with the patient or authorized representative who has indicated his/her understanding and acceptance.     Plan Discussed with:   Anesthesia Plan Comments:         Anesthesia Quick Evaluation

## 2014-06-20 NOTE — H&P (Signed)
H&P documentation  -History and Physical Reviewed  -Patient has been re-examined  -No change in the plan of care  Colin Bradley M  

## 2014-06-20 NOTE — Transfer of Care (Signed)
Immediate Anesthesia Transfer of Care Note  Patient: Colin Bradley  Procedure(s) Performed: Procedure(s): MULTIPLE EXTRACTIONS TEETH NUMBER TWO, TEN, TWELVE, EIGHTEEN, TWENTY-ONE, TWENTY-TWO, TWENTY-NINE, THIRTY-TWO (N/A)  Patient Location: PACU  Anesthesia Type:General  Level of Consciousness: awake, alert  and oriented  Airway & Oxygen Therapy: Patient Spontanous Breathing and Patient connected to face mask oxygen  Post-op Assessment: Report given to PACU RN  Post vital signs: Reviewed and stable  Complications: No apparent anesthesia complications

## 2014-06-20 NOTE — Op Note (Signed)
06/20/2014  9:30 AM  PATIENT:  Colin Bradley  63 y.o. male  PRE-OPERATIVE DIAGNOSIS:  NON-RESTORABLE TEETH  POST-OPERATIVE DIAGNOSIS:  SAME + nonrestorable tooth # 1  PROCEDURE:  Procedure(s): MULTIPLE EXTRACTIONS TEETH NUMBER ONE, TWO, TEN, TWELVE, EIGHTEEN, TWENTY-ONE, TWENTY-TWO, TWENTY-NINE, THIRTY-TWO  SURGEON:  Surgeon(s): Georgia Lopes, DDS  ANESTHESIA:   local and general  EBL:  minimal  DRAINS: none   SPECIMEN:  No Specimen  COUNTS:  YES  PLAN OF CARE: Discharge to home after PACU  PATIENT DISPOSITION:  PACU - hemodynamically stable.   PROCEDURE DETAILS: Dictation # [id number]  Georgia Lopes, DMD 06/20/2014 9:30 AM

## 2014-06-21 NOTE — Op Note (Signed)
NAMEULYSEE, FYOCK              ACCOUNT NO.:  [id number]  MEDICAL RECORD NO.:  [id number]  LOCATION:  [facility]                         FACILITY:  [facility]  PHYSICIAN:  Georgia Lopes, M.D.  DATE OF BIRTH:  [birth year]  DATE OF PROCEDURE:  06/20/2014 DATE OF DISCHARGE:  06/20/2014                              OPERATIVE REPORT   PREOPERATIVE DIAGNOSIS:  Nonrestorable teeth #2, #10, #12, #18, #21, #22, #29, #32.  POSTOPERATIVE DIAGNOSIS:  Nonrestorable teeth #2, #10, #12, #18, #21, #22, #29, #32; nonrestorable tooth #1.  PROCEDURES:  Extraction of teeth #1, #2, #10, #12, #18, #21, #22, #29, #32.  SURGEON:  Georgia Lopes, M.D.  ANESTHESIA:  General.  PROCEDURE IN DETAIL:  The patient was taken to the operating room, placed on the table in supine position.  General anesthesia was administered intravenously and an oral endotracheal tube was placed and marked.  The patient was then draped for the procedure and time-out was performed.  The posterior pharynx was suctioned and throat pack was placed.  Then, 2% lidocaine with 1:100,000 epinephrine was infiltrated in an inferior alveolar block on the right and left side and buccal and palatal infiltration in the right and left maxilla around the teeth to be removed.  A bite block was placed in the right side of the mouth and a sweetheart retractor was used to retract the teeth.  A #15 blade was used to make an incision beginning at tooth #18 on the buccal and lingual surfaces at the gingival sulcus and also around teeth #21 and #22 in the maxilla.  The incision was made around teeth #10 and #12 both buccally and palatally in the gingival sulcus.  The periosteum was reflected from around these teeth.  The teeth were elevated with a 301 elevator.  The upper teeth were removed using the dental forceps #150. Tooth #18 was removed with a 301 elevator.  Teeth #21 and #22, required removal of bone in order to gain purchase point with a 301  elevator and tooth #22 was removed with the Asch forceps and tooth #21 was removed with a rongeurs.  The sockets were then curetted and irrigated and closed with 3-0 chromic.  The bite block and sweetheart retractor were then repositioned to the other side of the mouth and the endotracheal tube was repositioned as well.  Then a #15 blade was used to make an incision around teeth #2, #29, and #32.  The periosteum was reflected and the teeth were elevated with a 301 elevator.  Upon elevation of tooth #2 and tooth #1 became grossly mobile and it was determined this tooth should be removed.  Then, the teeth were extracted using the 150 forceps in the maxilla and the Asch forceps in the mandible with tooth #29.  Tooth #32 required removal of additional bone to gain a purchase point and then the tooth was elevated and removed with a 301 elevator. The sockets were curetted, irrigated, and closed with 3-0 chromic.  The oral cavity was irrigated and inspected, and suctioned.  Throat pack was removed.  The patient was awakened and taken to the recovery room, breathing spontaneously in good condition.  ESTIMATED BLOOD LOSS:  Minimal.  COMPLICATIONS:  None.  SPECIMENS:  None.     Georgia Lopes, M.D.     SMJ/MEDQ  D:  06/20/2014  T:  06/21/2014  Job:  [id number]

## 2014-06-23 ENCOUNTER — Encounter (HOSPITAL_COMMUNITY): Payer: Self-pay | Admitting: Oral Surgery

## 2014-11-05 ENCOUNTER — Encounter (HOSPITAL_COMMUNITY): Payer: Self-pay | Admitting: Emergency Medicine

## 2014-11-05 ENCOUNTER — Inpatient Hospital Stay (HOSPITAL_COMMUNITY)
Admission: EM | Admit: 2014-11-05 | Discharge: 2014-11-07 | DRG: 194 | Disposition: A | Discharge status: Nursing Home (Includes ICF) | Payer: Medicare Other | Attending: Internal Medicine | Admitting: Internal Medicine

## 2014-11-05 ENCOUNTER — Emergency Department (HOSPITAL_COMMUNITY): Payer: Medicare Other

## 2014-11-05 DIAGNOSIS — E876 Hypokalemia: Secondary | ICD-10-CM | POA: Diagnosis present

## 2014-11-05 DIAGNOSIS — D649 Anemia, unspecified: Secondary | ICD-10-CM | POA: Diagnosis present

## 2014-11-05 DIAGNOSIS — G40909 Epilepsy, unspecified, not intractable, without status epilepticus: Secondary | ICD-10-CM

## 2014-11-05 DIAGNOSIS — R0902 Hypoxemia: Secondary | ICD-10-CM | POA: Diagnosis present

## 2014-11-05 DIAGNOSIS — E860 Dehydration: Secondary | ICD-10-CM | POA: Diagnosis present

## 2014-11-05 DIAGNOSIS — I452 Bifascicular block: Secondary | ICD-10-CM | POA: Diagnosis present

## 2014-11-05 DIAGNOSIS — Z79891 Long term (current) use of opiate analgesic: Secondary | ICD-10-CM | POA: Diagnosis not present

## 2014-11-05 DIAGNOSIS — N179 Acute kidney failure, unspecified: Secondary | ICD-10-CM | POA: Diagnosis present

## 2014-11-05 DIAGNOSIS — Z87442 Personal history of urinary calculi: Secondary | ICD-10-CM

## 2014-11-05 DIAGNOSIS — R112 Nausea with vomiting, unspecified: Secondary | ICD-10-CM | POA: Diagnosis present

## 2014-11-05 DIAGNOSIS — G809 Cerebral palsy, unspecified: Secondary | ICD-10-CM | POA: Diagnosis present

## 2014-11-05 DIAGNOSIS — F329 Major depressive disorder, single episode, unspecified: Secondary | ICD-10-CM | POA: Diagnosis present

## 2014-11-05 DIAGNOSIS — F79 Unspecified intellectual disabilities: Secondary | ICD-10-CM

## 2014-11-05 DIAGNOSIS — Y95 Nosocomial condition: Secondary | ICD-10-CM | POA: Diagnosis present

## 2014-11-05 DIAGNOSIS — J189 Pneumonia, unspecified organism: Principal | ICD-10-CM | POA: Diagnosis present

## 2014-11-05 DIAGNOSIS — R509 Fever, unspecified: Secondary | ICD-10-CM

## 2014-11-05 DIAGNOSIS — Z79899 Other long term (current) drug therapy: Secondary | ICD-10-CM | POA: Diagnosis not present

## 2014-11-05 DIAGNOSIS — R197 Diarrhea, unspecified: Secondary | ICD-10-CM | POA: Diagnosis present

## 2014-11-05 DIAGNOSIS — N400 Enlarged prostate without lower urinary tract symptoms: Secondary | ICD-10-CM | POA: Diagnosis present

## 2014-11-05 DIAGNOSIS — R6510 Systemic inflammatory response syndrome (SIRS) of non-infectious origin without acute organ dysfunction: Secondary | ICD-10-CM

## 2014-11-05 DIAGNOSIS — Z85828 Personal history of other malignant neoplasm of skin: Secondary | ICD-10-CM | POA: Diagnosis not present

## 2014-11-05 DIAGNOSIS — I10 Essential (primary) hypertension: Secondary | ICD-10-CM | POA: Diagnosis present

## 2014-11-05 DIAGNOSIS — J690 Pneumonitis due to inhalation of food and vomit: Secondary | ICD-10-CM

## 2014-11-05 LAB — I-STAT TROPONIN, ED: TROPONIN I, POC: 0 ng/mL (ref 0.00–0.08)

## 2014-11-05 LAB — URINALYSIS, ROUTINE W REFLEX MICROSCOPIC
GLUCOSE, UA: NEGATIVE mg/dL
Hgb urine dipstick: NEGATIVE
KETONES UR: NEGATIVE mg/dL
LEUKOCYTES UA: NEGATIVE
Nitrite: NEGATIVE
Protein, ur: NEGATIVE mg/dL
SPECIFIC GRAVITY, URINE: 1.024 (ref 1.005–1.030)
Urobilinogen, UA: 1 mg/dL (ref 0.0–1.0)
pH: 5 (ref 5.0–8.0)

## 2014-11-05 LAB — COMPREHENSIVE METABOLIC PANEL
ALBUMIN: 3.2 g/dL — AB (ref 3.5–5.2)
ALT: 17 U/L (ref 0–53)
AST: 27 U/L (ref 0–37)
Alkaline Phosphatase: 64 U/L (ref 39–117)
Anion gap: 10 (ref 5–15)
BILIRUBIN TOTAL: 0.7 mg/dL (ref 0.3–1.2)
BUN: 25 mg/dL — ABNORMAL HIGH (ref 6–23)
CALCIUM: 8.3 mg/dL — AB (ref 8.4–10.5)
CO2: 19 mmol/L (ref 19–32)
Chloride: 113 mmol/L — ABNORMAL HIGH (ref 96–112)
Creatinine, Ser: 1.44 mg/dL — ABNORMAL HIGH (ref 0.50–1.35)
GFR calc Af Amer: 58 mL/min — ABNORMAL LOW (ref >90)
GFR, EST NON AFRICAN AMERICAN: 50 mL/min — AB (ref >90)
GLUCOSE: 103 mg/dL — AB (ref 70–99)
Potassium: 3.4 mmol/L — ABNORMAL LOW (ref 3.5–5.1)
SODIUM: 142 mmol/L (ref 135–145)
Total Protein: 6.6 g/dL (ref 6.0–8.3)

## 2014-11-05 LAB — I-STAT CG4 LACTIC ACID, ED: LACTIC ACID, VENOUS: 1.98 mmol/L (ref 0.5–2.0)

## 2014-11-05 LAB — CBC WITH DIFFERENTIAL/PLATELET
Basophils Absolute: 0 10*3/uL (ref 0.0–0.1)
Basophils Relative: 0 % (ref 0–1)
Eosinophils Absolute: 0.2 10*3/uL (ref 0.0–0.7)
Eosinophils Relative: 1 % (ref 0–5)
HCT: 35.4 % — ABNORMAL LOW (ref 39.0–52.0)
Hemoglobin: 12.1 g/dL — ABNORMAL LOW (ref 13.0–17.0)
Lymphocytes Relative: 12 % (ref 12–46)
Lymphs Abs: 1.5 10*3/uL (ref 0.7–4.0)
MCH: 32.4 pg (ref 26.0–34.0)
MCHC: 34.2 g/dL (ref 30.0–36.0)
MCV: 94.9 fL (ref 78.0–100.0)
MONO ABS: 0.9 10*3/uL (ref 0.1–1.0)
Monocytes Relative: 7 % (ref 3–12)
Neutro Abs: 10.2 10*3/uL — ABNORMAL HIGH (ref 1.7–7.7)
Neutrophils Relative %: 80 % — ABNORMAL HIGH (ref 43–77)
PLATELETS: 283 10*3/uL (ref 150–400)
RBC: 3.73 MIL/uL — AB (ref 4.22–5.81)
RDW: 13.2 % (ref 11.5–15.5)
WBC: 12.8 10*3/uL — ABNORMAL HIGH (ref 4.0–10.5)

## 2014-11-05 LAB — PHENYTOIN LEVEL, TOTAL: Phenytoin Lvl: <2.5 ug/mL — ABNORMAL LOW (ref 10.0–20.0)

## 2014-11-05 LAB — LIPASE, BLOOD: Lipase: 21 U/L (ref 11–59)

## 2014-11-05 MED ORDER — GUAIFENESIN 100 MG/5ML PO SOLN
10.0000 mL | Freq: Three times a day (TID) | ORAL | Status: DC
Start: 2014-11-06 — End: 2014-11-07
  Administered 2014-11-06 – 2014-11-07 (×5): 200 mg via ORAL
  Filled 2014-11-05 (×7): qty 10

## 2014-11-05 MED ORDER — TAMSULOSIN HCL 0.4 MG PO CAPS
0.4000 mg | ORAL_CAPSULE | Freq: Every day | ORAL | Status: DC
  Administered 2014-11-06 – 2014-11-07 (×2): 0.4 mg via ORAL
  Filled 2014-11-05 (×3): qty 1

## 2014-11-05 MED ORDER — ALBUTEROL SULFATE (2.5 MG/3ML) 0.083% IN NEBU: 2.5000 mg | INHALATION_SOLUTION | Freq: Four times a day (QID) | RESPIRATORY_TRACT | Status: DC | PRN

## 2014-11-05 MED ORDER — ONDANSETRON HCL 4 MG/2ML IJ SOLN: 4.0000 mg | Freq: Three times a day (TID) | INTRAMUSCULAR | Status: DC | PRN

## 2014-11-05 MED ORDER — PROMETHAZINE HCL 25 MG PO TABS: 12.5000 mg | ORAL_TABLET | Freq: Two times a day (BID) | ORAL | Status: DC | PRN

## 2014-11-05 MED ORDER — STARCH (THICKENING) PO POWD
1.0000 g | Freq: Two times a day (BID) | ORAL | Status: DC
  Filled 2014-11-05: qty 227

## 2014-11-05 MED ORDER — CYCLOSPORINE 0.05 % OP EMUL
1.0000 [drp] | Freq: Two times a day (BID) | OPHTHALMIC | Status: DC
  Administered 2014-11-05 – 2014-11-07 (×4): 1 [drp] via OPHTHALMIC
  Filled 2014-11-05 (×5): qty 1

## 2014-11-05 MED ORDER — LACOSAMIDE 50 MG PO TABS
150.0000 mg | ORAL_TABLET | Freq: Two times a day (BID) | ORAL | Status: DC
  Administered 2014-11-05 – 2014-11-07 (×4): 150 mg via ORAL
  Filled 2014-11-05 (×4): qty 3

## 2014-11-05 MED ORDER — PANTOPRAZOLE SODIUM 40 MG PO TBEC
40.0000 mg | DELAYED_RELEASE_TABLET | Freq: Two times a day (BID) | ORAL | Status: DC
  Administered 2014-11-05 – 2014-11-07 (×4): 40 mg via ORAL
  Filled 2014-11-05 (×5): qty 1

## 2014-11-05 MED ORDER — ACETAMINOPHEN 650 MG RE SUPP
650.0000 mg | Freq: Once | RECTAL | Status: AC
  Administered 2014-11-05: 650 mg via RECTAL
  Filled 2014-11-05: qty 1

## 2014-11-05 MED ORDER — LACOSAMIDE 150 MG PO TABS: 150.0000 mg | ORAL_TABLET | Freq: Two times a day (BID) | ORAL | Status: DC

## 2014-11-05 MED ORDER — SODIUM CHLORIDE 0.9 % IV SOLN: INTRAVENOUS | Status: DC

## 2014-11-05 MED ORDER — ONDANSETRON HCL 4 MG PO TABS: 4.0000 mg | ORAL_TABLET | Freq: Three times a day (TID) | ORAL | Status: DC | PRN

## 2014-11-05 MED ORDER — VANCOMYCIN HCL IN DEXTROSE 1-5 GM/200ML-% IV SOLN
1000.0000 mg | Freq: Once | INTRAVENOUS | Status: AC
  Administered 2014-11-05: 1000 mg via INTRAVENOUS
  Filled 2014-11-05: qty 200

## 2014-11-05 MED ORDER — ESCITALOPRAM OXALATE 20 MG PO TABS
20.0000 mg | ORAL_TABLET | Freq: Every day | ORAL | Status: DC
  Administered 2014-11-06 – 2014-11-07 (×2): 20 mg via ORAL
  Filled 2014-11-05 (×4): qty 1

## 2014-11-05 MED ORDER — PRAZOSIN HCL 2 MG PO CAPS
2.0000 mg | ORAL_CAPSULE | Freq: Every day | ORAL | Status: DC
  Administered 2014-11-05 – 2014-11-06 (×2): 2 mg via ORAL
  Filled 2014-11-05 (×3): qty 1

## 2014-11-05 MED ORDER — ONDANSETRON HCL 4 MG/2ML IJ SOLN
4.0000 mg | Freq: Once | INTRAMUSCULAR | Status: AC
  Administered 2014-11-05: 4 mg via INTRAVENOUS
  Filled 2014-11-05: qty 2

## 2014-11-05 MED ORDER — LOPERAMIDE HCL 2 MG PO CAPS: 2.0000 mg | ORAL_CAPSULE | ORAL | Status: DC | PRN

## 2014-11-05 MED ORDER — SODIUM CHLORIDE 0.9 % IV SOLN
1000.0000 mL | INTRAVENOUS | Status: DC
  Administered 2014-11-05: 1000 mL via INTRAVENOUS

## 2014-11-05 MED ORDER — DEXTROMETHORPHAN POLISTIREX 30 MG/5ML PO LQCR
15.0000 mg | Freq: Two times a day (BID) | ORAL | Status: DC
  Administered 2014-11-05: 2.5 mg via ORAL
  Administered 2014-11-06 – 2014-11-07 (×3): 15 mg via ORAL
  Filled 2014-11-05 (×5): qty 5

## 2014-11-05 MED ORDER — PIPERACILLIN-TAZOBACTAM 3.375 G IVPB
3.3750 g | Freq: Three times a day (TID) | INTRAVENOUS | Status: DC
  Administered 2014-11-05 – 2014-11-07 (×6): 3.375 g via INTRAVENOUS
  Filled 2014-11-05 (×7): qty 50

## 2014-11-05 MED ORDER — AMITRIPTYLINE HCL 25 MG PO TABS
25.0000 mg | ORAL_TABLET | Freq: Every day | ORAL | Status: DC
  Administered 2014-11-06 – 2014-11-07 (×2): 25 mg via ORAL
  Filled 2014-11-05 (×3): qty 1

## 2014-11-05 MED ORDER — DUTASTERIDE 0.5 MG PO CAPS
0.5000 mg | ORAL_CAPSULE | Freq: Every day | ORAL | Status: DC
  Administered 2014-11-06 – 2014-11-07 (×2): 0.5 mg via ORAL
  Filled 2014-11-05 (×3): qty 1

## 2014-11-05 MED ORDER — ENOXAPARIN SODIUM 30 MG/0.3ML ~~LOC~~ SOLN
30.0000 mg | SUBCUTANEOUS | Status: DC
  Administered 2014-11-06 (×2): 30 mg via SUBCUTANEOUS
  Filled 2014-11-05 (×3): qty 0.3

## 2014-11-05 MED ORDER — DUTASTERIDE-TAMSULOSIN HCL 0.5-0.4 MG PO CAPS: 1.0000 | ORAL_CAPSULE | Freq: Every day | ORAL | Status: DC

## 2014-11-05 MED ORDER — LISINOPRIL 20 MG PO TABS
30.0000 mg | ORAL_TABLET | Freq: Every day | ORAL | Status: DC
  Administered 2014-11-06 – 2014-11-07 (×2): 30 mg via ORAL
  Filled 2014-11-05 (×3): qty 1

## 2014-11-05 MED ORDER — SODIUM CHLORIDE 0.9 % IV SOLN
INTRAVENOUS | Status: DC
  Administered 2014-11-05 – 2014-11-07 (×3): via INTRAVENOUS

## 2014-11-05 MED ORDER — RESOURCE THICKENUP CLEAR PO POWD
Freq: Two times a day (BID) | ORAL | Status: DC
  Administered 2014-11-06 – 2014-11-07 (×3): via ORAL
  Filled 2014-11-05: qty 125

## 2014-11-05 MED ORDER — LORAZEPAM 1 MG PO TABS
1.0000 mg | ORAL_TABLET | Freq: Three times a day (TID) | ORAL | Status: DC
  Administered 2014-11-05 – 2014-11-07 (×6): 1 mg via ORAL
  Filled 2014-11-05 (×6): qty 1

## 2014-11-05 MED ORDER — PIPERACILLIN-TAZOBACTAM 3.375 G IVPB 30 MIN
3.3750 g | Freq: Once | INTRAVENOUS | Status: AC
  Administered 2014-11-05: 3.375 g via INTRAVENOUS
  Filled 2014-11-05: qty 50

## 2014-11-05 MED ORDER — VANCOMYCIN HCL IN DEXTROSE 750-5 MG/150ML-% IV SOLN
750.0000 mg | INTRAVENOUS | Status: DC
  Filled 2014-11-05: qty 150

## 2014-11-05 MED ORDER — ADULT MULTIVITAMIN W/MINERALS CH
1.0000 | ORAL_TABLET | Freq: Every day | ORAL | Status: DC
  Administered 2014-11-06 – 2014-11-07 (×2): 1 via ORAL
  Filled 2014-11-05 (×3): qty 1

## 2014-11-05 MED ORDER — DEXTROSE 5 % IV SOLN: 1.0000 g | Freq: Three times a day (TID) | INTRAVENOUS | Status: DC

## 2014-11-05 MED ORDER — DAILY VITE PO TABS: ORAL_TABLET | Freq: Every day | ORAL | Status: DC

## 2014-11-05 MED ORDER — SUCRALFATE 1 GM/10ML PO SUSP
1.0000 g | Freq: Two times a day (BID) | ORAL | Status: DC
  Administered 2014-11-05 – 2014-11-07 (×4): 1 g via ORAL
  Filled 2014-11-05 (×5): qty 10

## 2014-11-05 MED ORDER — CLONIDINE HCL 0.2 MG PO TABS
0.2000 mg | ORAL_TABLET | Freq: Two times a day (BID) | ORAL | Status: DC
  Administered 2014-11-05 – 2014-11-07 (×4): 0.2 mg via ORAL
  Filled 2014-11-05 (×5): qty 1

## 2014-11-05 NOTE — ED Notes (Signed)
Per EMS [facility] found slumped over in wheelchair lethargic by staff. Pt n/v/d starting yesterday.

## 2014-11-05 NOTE — Progress Notes (Signed)
ANTIBIOTIC CONSULT NOTE - INITIAL  Pharmacy Consult for vancomycin and Zosyn Indication: rule out sepsis  No Known Allergies  Patient Measurements: Height: 5\' 7"  (170.2 cm) Weight: 115 lb (52.164 kg) IBW/kg (Calculated) : 66.1   Vital Signs: Temp: 97.8 F (36.6 C) (01/30 1620) Temp Source: Oral (01/30 1620) BP: 134/64 mmHg (01/30 1627) Pulse Rate: 108 (01/30 1627) Intake/Output from previous day:   Intake/Output from this shift: Total I/O In: 1500 [I.V.:1500] Out: -   Labs:  Recent Labs  11/05/14 1526  WBC 12.8*  HGB 12.1*  PLT 283  CREATININE 1.44*   Estimated Creatinine Clearance: 38.3 mL/min (by C-G formula based on Cr of 1.44).  Medical History: Past Medical History  Diagnosis Date  . Seizures   . Cerebral palsy   . Hypertension   . Mental retardation   . Depression   . Nephrolithiasis   . BPH (benign prostatic hyperplasia)   . GI bleed 2009    coffee ground emesis, erosive esophagitis on EGD by Dr Juanda ChanceBrodie  . Esophageal stricture 2009    not dilated during the EGD, biopsy benign:  no Barretts  . Depression with anxiety     Medications:  Scheduled:   Infusions:  . sodium chloride 1,000 mL (11/05/14 1550)  . vancomycin 1,000 mg (11/05/14 1549)   Assessment: [age] yoM with MR admitted from NH after being found slumped over and lethargic by staff. Pt reported to have N/V/D yesterday. He was noted with AMS, hypoxia, and tachycardia in the ED. Pharmacy is consulted to dose vancomycin and Zosyn for sepsis.  Antiinfectives  1/30 >> vancomycin >> 1/30 >> Zosyn >>    Labs / vitals Tmax: 102 WBCs: 12.8 Renal: SCr 1.44 (baseline unknown), CrCl 38 ml/min CG, 52 ml/min N  Microbiology 1/30 blood x2: IP 1/30 urine: IP    Goal of Therapy:  Vancomycin trough level 15-20 mcg/ml Zosyn per indication and renal function  Plan:  - noted 1st doses of antibiotics administered in the ED - Zosyn 3.375G IV q8h, each dose to be infused over 4 hours -  vancomycin 750mg  IV q24h - vancomycin trough at steady state if indicated - follow-up clinical course, culture results, renal function - follow-up antibiotic de-escalation and length of therapy   Thank you for the consult.  Ross LudwigJesse Giara Mcgaughey Akers, PharmD, BCPS Pager: [phone] Pharmacy: [phone] 11/05/2014 4:46 PM

## 2014-11-05 NOTE — H&P (Addendum)
[facility] History and Physical  Colin Bradley KGM:[id number] DOB: 12/03/1950 DOA: 11/05/2014   PCP: Julian Hy, MD    Chief Complaint: sent for vomiting/ diarrhea and lethargy  HPI: Colin Bradley is a 64 y.o. male with Cerebral palsy, developmental delay, seizure disorder, HTN, MR, GI bleed comes in after being found lethargic at the nursing home. He is not able to give me a history and therefore, history is obtained from the ER PA and the chart. He has been having vomiting and diarrhea for a number of days. In the ER he is found to be febrile and CXR reveals a pneumonia. He is hypoxic with a pulse ox of 80s on room air.    ROS: patient very slow to respond to questions. Some answers inappropriate therefore, unable to d ROS  Past Medical History  Diagnosis Date  . Seizures   . Cerebral palsy   . Hypertension   . Mental retardation   . Depression   . Nephrolithiasis   . BPH (benign prostatic hyperplasia)   . GI bleed 2009    coffee ground emesis, erosive esophagitis on EGD by Dr Juanda Chance  . Esophageal stricture 2009    not dilated during the EGD, biopsy benign:  no Barretts  . Depression with anxiety     Past Surgical History  Procedure Laterality Date  . Basal cell carcinoma excision  2002    forehead  . Squamous cell carcinoma excision  2004    lip  . Bowen dz  2002    right cheek   . Esophagogastroduodenoscopy  06/14/2012    Procedure: ESOPHAGOGASTRODUODENOSCOPY (EGD);  Surgeon: Rachael Fee, MD;  Location: [facility] ENDOSCOPY;  Service: Endoscopy;  Laterality: N/A;  . Multiple extractions with alveoloplasty N/A 06/20/2014    Procedure: MULTIPLE EXTRACTIONS TEETH NUMBER TWO, TEN, TWELVE, EIGHTEEN, TWENTY-ONE, TWENTY-TWO, TWENTY-NINE, THIRTY-TWO;  Surgeon: Georgia Lopes, DDS;  Location: [facility] OR;  Service: Oral Surgery;  Laterality: N/A;    Social History: lives in SNF, states he does not smoke or drink Non-ambulatory   No Known Allergies  No family  history on file. unable to obtain family history.    Prior to Admission medications   Medication Sig Start Date End Date Taking? Authorizing Provider  amitriptyline (ELAVIL) 25 MG tablet Take 25 mg by mouth daily with breakfast.    Yes Historical Provider, MD  cloNIDine (CATAPRES) 0.2 MG tablet Take 0.2 mg by mouth 2 (two) times daily.    Yes Historical Provider, MD  cycloSPORINE (RESTASIS) 0.05 % ophthalmic emulsion Place 1 drop into both eyes 2 (two) times daily.   Yes Historical Provider, MD  Dutasteride-Tamsulosin HCl (JALYN) 0.5-0.4 MG CAPS Take 1 tablet by mouth daily with breakfast.    Yes Historical Provider, MD  escitalopram (LEXAPRO) 20 MG tablet Take 20 mg by mouth daily with breakfast.    Yes Historical Provider, MD  food thickener (THICK IT) POWD Take 1 g by mouth 2 (two) times daily.   Yes Historical Provider, MD  guaiFENesin (ROBITUSSIN) 100 MG/5ML SOLN Take 10 mLs by mouth 3 (three) times daily. For 10 days 10/27/14  Yes Historical Provider, MD  Lacosamide (VIMPAT) 150 MG TABS Take 150 mg by mouth 2 (two) times daily.   Yes Historical Provider, MD  lisinopril (PRINIVIL,ZESTRIL) 30 MG tablet Take 30 mg by mouth daily with breakfast.   Yes Historical Provider, MD  loperamide (IMODIUM) 2 MG capsule Take 2-4 mg by mouth as needed for diarrhea or loose stools.  Take  after 1st loose stool, then  after each additional loose stool x 48 hours   Yes Historical Provider, MD  LORazepam (ATIVAN) 1 MG tablet Take 1 mg by mouth 3 (three) times daily.   Yes Historical Provider, MD  Multiple Vitamin (DAILY VITE PO) Take 1 tablet by mouth daily with breakfast.    Yes Historical Provider, MD  ondansetron (ZOFRAN) 4 MG tablet Take 4 mg by mouth 3 (three) times daily as needed for nausea or vomiting.   Yes Historical Provider, MD  pantoprazole (PROTONIX) 40 MG tablet Take 1 tablet (40 mg total) by mouth 2 (two) times daily. 08/13/12  Yes Hart Carwin, MD  prazosin (MINIPRESS) 2 MG capsule Take 2  mg by mouth at bedtime.    Yes Historical Provider, MD  promethazine (PHENERGAN) 12.5 MG tablet Take 12.5 mg by mouth every 12 (twelve) hours as needed for nausea or vomiting.   Yes Historical Provider, MD  HYDROcodone-acetaminophen (NORCO) 5-325 MG per tablet Take 1 tablet by mouth every 4 (four) hours as needed for moderate pain (D/C after 5 days). Patient not taking: Reported on 11/05/2014 06/20/14   Georgia Lopes, DDS  sucralfate (CARAFATE) 1 GM/10ML suspension Take 1 g by mouth 2 (two) times daily.    Historical Provider, MD     Physical Exam: Filed Vitals:   11/05/14 1645 11/05/14 1700 11/05/14 1715 11/05/14 1730  BP: 133/61 134/71 147/71 137/75  Pulse: 103 103 102 101  Temp:      TempSrc:      Resp: Height:      Weight:      SpO2: 95% 95% 95% 96%     General: awake and alert, no distress HEENT: Normocephalic and Atraumatic, Mucous membranes pink- dry oral mucosa                PERRLA; EOM intact; No scleral icterus,                 Nares: Patent, Oropharynx: Clear, Fair Dentition                 Neck: FROM, no cervical lymphadenopathy, thyromegaly, carotid bruit or JVD;  Breasts: deferred CHEST WALL: No tenderness  CHEST: Normal respiration, crackles at bases b/l  HEART: Regular rate and rhythm; no murmurs rubs or gallops  BACK: No kyphosis or scoliosis; no CVA tenderness  GI: Positive Bowel Sounds, soft, non-tender; no masses, no organomegaly Rectal Exam: deferred MSK: No cyanosis, clubbing, or edema Genitalia: not examined  SKIN:  no rash or ulceration  CNS: Alert and Oriented x 4, cannot move legs- normal strength in arms, CN 2-12 intact  Labs on Admission:  Basic Metabolic Panel:  Recent Labs Lab 11/05/14 1526  NA 142  K 3.4*  CL 113*  CO2 19  GLUCOSE 103*  BUN 25*  CREATININE 1.44*  CALCIUM 8.3*   Liver Function Tests:  Recent Labs Lab 11/05/14 1526  AST 27  ALT 17  ALKPHOS 64  BILITOT 0.7  PROT 6.6  ALBUMIN 3.2*    Recent  Labs Lab 11/05/14 1526  LIPASE 21   No results for input(s): AMMONIA in the last 168 hours. CBC:  Recent Labs Lab 11/05/14 1526  WBC 12.8*  NEUTROABS 10.2*  HGB 12.1*  HCT 35.4*  MCV 94.9  PLT 283   Cardiac Enzymes: No results for input(s): CKTOTAL, CKMB, CKMBINDEX, TROPONINI in the last 168 hours.  BNP (last 3 results) No results  for input(s): PROBNP in the last 8760 hours. CBG: No results for input(s): GLUCAP in the last 168 hours.  Radiological Exams on Admission: Dg Chest Port 1 View  11/05/2014   CLINICAL DATA:  Fever.  History of seizure.  EXAM: PORTABLE CHEST - 1 VIEW  COMPARISON:  PA and lateral chest 06/17/2014.  FINDINGS: Airspace disease is seen the lung bases bilaterally and in the right mid lung zone. Mild elevation of the right hemidiaphragm relative to the left is unchanged. Heart size is upper normal.  IMPRESSION: Right worse than left airspace disease most worrisome for pneumonia.   Electronically Signed   By: Drusilla Kannerhomas  Dalessio M.D.   On: 11/05/2014 15:55    EKG: Independently reviewed. Sinus tach with RBBB  Assessment/Plan Principal Problem:   HCAP- possible Aspiration pneumonia - may have aspirated when he vomited - cont Vanc and Zosyn - check legionella and strep antigens - cont O2 as needed - PRN nebs if wheezing  Active Problems: ARF - due to vomiting and diarrhea-  - hydrate with IVF  Hypokalemia - likely from diarrhea/ poor PO intake - replace k and recheck in the AM  Vomiting and diarrhea - benign abdominal exam - possible gastroenteritis but should rule out C diff colitis esp as above mentioned antibiotics will make it worse if he has C diff - place on full liquids for tonight    Mental retardation   Infantile cerebral palsy    Seizure disorder - cont Vimpat  HTN - cont Clonidine, Lisinopril   Consulted:   Code Status: full code Family Communication: none  DVT Prophylaxis:Lovenox  Time spent: 55 min  Jacquetta Polhamus,  MD [facility]  If 7PM-7AM, please contact night-coverage [email or web address] 11/05/2014, 6:00 PM

## 2014-11-05 NOTE — ED Notes (Signed)
Bed: ZO10WA14 Expected date:  Expected time:  Means of arrival:  Comments: Tachy, decreased Sats

## 2014-11-05 NOTE — ED Notes (Signed)
Lab report have enough blood from previous blood draw by Netty Starringerrance Mitchell.

## 2014-11-05 NOTE — ED Provider Notes (Signed)
CSN: [id number]     Arrival date & time 11/05/14  1518 History   First MD Initiated Contact with Patient 11/05/14 1532     Chief Complaint  Patient presents with  . Lethargic   . Emesis  . Diarrhea     (Consider location/radiation/quality/duration/timing/severity/associated sxs/prior Treatment) HPI Colin Bradley is a 64 y.o. male with hx of seizures, CP HTN, MR, GI bleed, presents to ED after being found altered at his nursing home around noon today. Pt at baseline talkative, responsive. Pt was found to be lethargic, slumped over in his wheelchair, coughing. Staff also report n/v/d that started yesterday. Pt transported for further evaluation. Pt unable to provide much history, states he is not having any pain. EMS states smelled strong of urine.   Past Medical History  Diagnosis Date  . Seizures   . Cerebral palsy   . Hypertension   . Mental retardation   . Depression   . Nephrolithiasis   . BPH (benign prostatic hyperplasia)   . GI bleed 2009    coffee ground emesis, erosive esophagitis on EGD by Dr Juanda Chance  . Esophageal stricture 2009    not dilated during the EGD, biopsy benign:  no Barretts  . Depression with anxiety    Past Surgical History  Procedure Laterality Date  . Basal cell carcinoma excision  2002    forehead  . Squamous cell carcinoma excision  2004    lip  . Bowen dz  2002    right cheek   . Esophagogastroduodenoscopy  06/14/2012    Procedure: ESOPHAGOGASTRODUODENOSCOPY (EGD);  Surgeon: Rachael Fee, MD;  Location: [facility] ENDOSCOPY;  Service: Endoscopy;  Laterality: N/A;  . Multiple extractions with alveoloplasty N/A 06/20/2014    Procedure: MULTIPLE EXTRACTIONS TEETH NUMBER TWO, TEN, TWELVE, EIGHTEEN, TWENTY-ONE, TWENTY-TWO, TWENTY-NINE, THIRTY-TWO;  Surgeon: Georgia Lopes, DDS;  Location: [facility] OR;  Service: Oral Surgery;  Laterality: N/A;   No family history on file. History  Substance Use Topics  . Smoking status: Never Smoker   . Smokeless tobacco: Not  on file  . Alcohol Use: No    Review of Systems  Unable to perform ROS: Mental status change      Allergies  Review of patient's allergies indicates no known allergies.  Home Medications   Prior to Admission medications   Medication Sig Start Date End Date Taking? Authorizing Provider  amitriptyline (ELAVIL) 25 MG tablet Take 25 mg by mouth at bedtime.     Historical Provider, MD  cloNIDine (CATAPRES) 0.2 MG tablet Take 0.2 mg by mouth 2 (two) times daily.     Historical Provider, MD  Dutasteride-Tamsulosin HCl (JALYN) 0.5-0.4 MG CAPS Take 1 tablet by mouth daily.    Historical Provider, MD  escitalopram (LEXAPRO) 20 MG tablet Take 20 mg by mouth daily.    Historical Provider, MD  HYDROcodone-acetaminophen (NORCO) 5-325 MG per tablet Take 1 tablet by mouth every 4 (four) hours as needed for moderate pain (D/C after 5 days). 06/20/14   Georgia Lopes, DDS  Lacosamide (VIMPAT) 150 MG TABS Take 150 mg by mouth 2 (two) times daily.    Historical Provider, MD  LORazepam (ATIVAN) 1 MG tablet Take 1 mg by mouth 3 (three) times daily.    Historical Provider, MD  Multiple Vitamin (DAILY VITE PO) Take 1 tablet by mouth daily.    Historical Provider, MD  pantoprazole (PROTONIX) 40 MG tablet Take 1 tablet (40 mg total) by mouth 2 (two) times daily. 08/13/12  Hart Carwinora M Brodie, MD  prazosin (MINIPRESS) 2 MG capsule Take 2 mg by mouth at bedtime.     Historical Provider, MD  sucralfate (CARAFATE) 1 GM/10ML suspension Take 1 g by mouth 2 (two) times daily.    Historical Provider, MD   BP 161/76 mmHg  Pulse 114  Temp(Src) 102 F (38.9 C) (Rectal)  Resp 25  SpO2 91% Physical Exam  Constitutional: He appears well-developed and well-nourished. No distress.  HENT:  Head: Normocephalic and atraumatic.  Eyes: Conjunctivae and EOM are normal. Pupils are equal, round, and reactive to light.  Neck: Normal range of motion. Neck supple.  No meningismus  Cardiovascular: Normal rate, regular rhythm and  normal heart sounds.   Pulmonary/Chest: Effort normal. No respiratory distress. He has no wheezes. He has rales.  Abdominal: Soft. Bowel sounds are normal. He exhibits no distension. There is no tenderness. There is no rebound.  Musculoskeletal: He exhibits no edema.  Neurological:  Initially asleep, not arousable to voice. Arousable to painful stimuli. Able to say yes or no to simple questions. grp strength equal bilaterally  Skin: Skin is warm and dry.  Nursing note and vitals reviewed.   ED Course  Procedures (including critical care time) Labs Review Labs Reviewed  CBC WITH DIFFERENTIAL/PLATELET - Abnormal; Notable for the following:    WBC 12.8 (*)    RBC 3.73 (*)    Hemoglobin 12.1 (*)    HCT 35.4 (*)    Neutrophils Relative % 80 (*)    Neutro Abs 10.2 (*)    All other components within normal limits  COMPREHENSIVE METABOLIC PANEL - Abnormal; Notable for the following:    Potassium 3.4 (*)    Chloride 113 (*)    Glucose, Bld 103 (*)    BUN 25 (*)    Creatinine, Ser 1.44 (*)    Calcium 8.3 (*)    Albumin 3.2 (*)    GFR calc non Af Amer 50 (*)    GFR calc Af Amer 58 (*)    All other components within normal limits  URINALYSIS, ROUTINE W REFLEX MICROSCOPIC - Abnormal; Notable for the following:    Color, Urine AMBER (*)    APPearance CLOUDY (*)    Bilirubin Urine SMALL (*)    All other components within normal limits  PHENYTOIN LEVEL, TOTAL - Abnormal; Notable for the following:    Phenytoin Lvl <2.5 (*)    All other components within normal limits  CULTURE, BLOOD (ROUTINE X 2)  CULTURE, BLOOD (ROUTINE X 2)  URINE CULTURE  CULTURE, EXPECTORATED SPUTUM-ASSESSMENT  GRAM STAIN  CLOSTRIDIUM DIFFICILE BY PCR  LIPASE, BLOOD  HIV ANTIBODY (ROUTINE TESTING)  LEGIONELLA ANTIGEN, URINE  STREP PNEUMONIAE URINARY ANTIGEN  I-STAT CG4 LACTIC ACID, ED  Rosezena SensorI-STAT TROPOININ, ED    Imaging Review Dg Chest Port 1 View  11/05/2014   CLINICAL DATA:  Fever.  History of seizure.   EXAM: PORTABLE CHEST - 1 VIEW  COMPARISON:  PA and lateral chest 06/17/2014.  FINDINGS: Airspace disease is seen the lung bases bilaterally and in the right mid lung zone. Mild elevation of the right hemidiaphragm relative to the left is unchanged. Heart size is upper normal.  IMPRESSION: Right worse than left airspace disease most worrisome for pneumonia.   Electronically Signed   By: Drusilla Kannerhomas  Dalessio M.D.   On: 11/05/2014 15:55     EKG Interpretation   Date/Time:  Saturday November 05 2014 15:25:02 EST Ventricular Rate:  113 PR Interval:  175 QRS  Duration: 141 QT Interval:  355 QTC Calculation: 487 R Axis:   -47 Text Interpretation:  Sinus tachycardia RBBB and LAFB No significant  change since last tracing Confirmed by BEATON  MD, ROBERT ([id number]) on  11/05/2014 3:32:59 PM      MDM   Final diagnoses:  Fever  SIRS (systemic inflammatory response syndrome)  HCAP (healthcare-associated pneumonia)  Nausea vomiting and diarrhea    Patient is here with altered mental status, hypoxic, tachycardic. Symptoms consistent with sepsis. Patient has had nausea, vomiting, diarrhea since yesterday, he is currently coughing. Suspect aspiration, possible UTI. Patient has MR in addition to his acute illness, unable to provide much history. He is alert and responds to simple commands. Labs of blood cultures ordered. Chest x-ray ordered. Will start on antibiotics after blood cultures are drawn. IV fluids started  CXR suspicious for pneumonia. Pt already started on sepsis protocol orders antibiotics, with no known source, which included vancomycin and zosyn. Pt's temp improved to 97.8. HR improving with IV fluids. He is away. No more vomiting. Discussed with [facility], will admit.   Filed Vitals:   11/05/14 1842 11/05/14 1845 11/05/14 1900 11/05/14 2221  BP: 131/74 148/86  120/64  Pulse: 100 98  86  Temp: 97.9 F (36.6 C)  98.9 F (37.2 C) 98.2 F (36.8 C)  TempSrc: Oral  Rectal Oral  Resp: Height:      Weight:      SpO2: 91% 96%  94%     Lottie Mussel, PA-C 11/06/14 0116  Nelia Shi, MD 11/06/14 2302

## 2014-11-06 DIAGNOSIS — N179 Acute kidney failure, unspecified: Secondary | ICD-10-CM

## 2014-11-06 DIAGNOSIS — G40909 Epilepsy, unspecified, not intractable, without status epilepticus: Secondary | ICD-10-CM

## 2014-11-06 LAB — BASIC METABOLIC PANEL
Anion gap: 7 (ref 5–15)
BUN: 21 mg/dL (ref 6–23)
CO2: 19 mmol/L (ref 19–32)
Calcium: 7.5 mg/dL — ABNORMAL LOW (ref 8.4–10.5)
Chloride: 116 mmol/L — ABNORMAL HIGH (ref 96–112)
Creatinine, Ser: 1.24 mg/dL (ref 0.50–1.35)
GFR calc non Af Amer: 60 mL/min — ABNORMAL LOW (ref >90)
GFR, EST AFRICAN AMERICAN: 69 mL/min — AB (ref >90)
Glucose, Bld: 74 mg/dL (ref 70–99)
POTASSIUM: 3.5 mmol/L (ref 3.5–5.1)
Sodium: 142 mmol/L (ref 135–145)

## 2014-11-06 LAB — CBC
HCT: 31.7 % — ABNORMAL LOW (ref 39.0–52.0)
Hemoglobin: 10.7 g/dL — ABNORMAL LOW (ref 13.0–17.0)
MCH: 32.3 pg (ref 26.0–34.0)
MCHC: 33.8 g/dL (ref 30.0–36.0)
MCV: 95.8 fL (ref 78.0–100.0)
Platelets: 257 10*3/uL (ref 150–400)
RBC: 3.31 MIL/uL — ABNORMAL LOW (ref 4.22–5.81)
RDW: 13.5 % (ref 11.5–15.5)
WBC: 10 10*3/uL (ref 4.0–10.5)

## 2014-11-06 LAB — STREP PNEUMONIAE URINARY ANTIGEN: STREP PNEUMO URINARY ANTIGEN: NEGATIVE

## 2014-11-06 MED ORDER — VANCOMYCIN HCL 500 MG IV SOLR
500.0000 mg | Freq: Two times a day (BID) | INTRAVENOUS | Status: DC
  Administered 2014-11-06 – 2014-11-07 (×2): 500 mg via INTRAVENOUS
  Filled 2014-11-06 (×3): qty 500

## 2014-11-06 NOTE — Progress Notes (Signed)
Call placed to [facility] per MD's order to inquire of pt's diet prior to admission.  spoke with Daniel NonesPortia, who said pt was on a regular diet with chopped foods and nectar thickened liquids. Pt's diet order modified to reflect same. Vwilliams,rn.

## 2014-11-06 NOTE — Progress Notes (Signed)
[facility] Progress Note   Colin Bradley:[id number] DOB: October 04, 1951 DOA: 11/05/2014 PCP: Julian Hy, MD  Brief narrative: Colin Bradley is a 64 y.o. male with Cerebral palsy, developmental delay, seizure disorder, HTN, MR, GI bleed comes in after being found lethargic at the nursing home. He was found to be febrile with a cough and a CXR revealing pneumonia   Subjective: No complaints today  Assessment/Plan: Principal Problem:   HCAP (healthcare-associated pneumonia) - with a high possibility of aspirating on vomitus - cont Vanc Zosyn- obtainSLP eval  Active Problems:  ARF/ dehydration - due to vomiting and diarrhea-  -resolving with hydration via IV  Hypokalemia - likely from diarrhea/ poor PO intake - replaced   Vomiting and diarrhea - benign abdominal exam - possible gastroenteritis - wanted to r/u C diff however, he has not had any episodes of diarrhea in the hospital therefore now there is less concern for C diff - tolerating full liquids- advance to regular diet now    Mental retardation   Infantile cerebral palsy    Seizure disorder - cont Vimpat and Dilantin   Code Status: Full code Family Communication:  Disposition Plan: return to SNF DVT prophylaxis: Lovenox  Consultants:   Procedures:   Antibiotics: Anti-infectives    Start     Dose/Rate Route Frequency Ordered Stop   11/06/14 1600  vancomycin (VANCOCIN) IVPB 750 mg/150 ml premix     750 mg150 mL/hr over 60 Minutes Intravenous Every 24 hours 11/05/14 1647     11/05/14 2200  piperacillin-tazobactam (ZOSYN) IVPB 3.375 g     3.375 g12.5 mL/hr over 240 Minutes Intravenous 3 times per day 11/05/14 1647     11/05/14 2200  ceFEPIme (MAXIPIME) 1 g in dextrose 5 % 50 mL IVPB  Status:  Discontinued     1 g100 mL/hr over 30 Minutes Intravenous 3 times per day 11/05/14 1840 11/05/14 1843   11/05/14 1545  piperacillin-tazobactam (ZOSYN) IVPB 3.375 g     3.375 g100 mL/hr over 30  Minutes Intravenous  Once 11/05/14 1539 11/05/14 1642   11/05/14 1545  vancomycin (VANCOCIN) IVPB 1000 mg/200 mL premix     1,000 mg200 mL/hr over 60 Minutes Intravenous  Once 11/05/14 1539 11/05/14 1649         Objective: Filed Weights   11/05/14 1605  Weight: 52.164 kg (115 lb)    Intake/Output Summary (Last 24 hours) at 11/06/14 1059 Last data filed at 11/06/14 0600  Gross per 24 hour  Intake 2703.33 ml  Output    200 ml  Net 2503.33 ml     Vitals Filed Vitals:   11/05/14 1845 11/05/14 1900 11/05/14 2221 11/06/14 0527  BP: 148/86  120/64 125/68  Pulse: 98  86 94  Temp:  98.9 F (37.2 C) 98.2 F (36.8 C) 97.8 F (36.6 C)  TempSrc:  Rectal Oral Oral  Resp: Height:      Weight:      SpO2: 96%  94% 94%    Exam: General: alert, No acute respiratory distress Lungs: Clear to auscultation bilaterally without wheezes or crackles Cardiovascular: Regular rate and rhythm without murmur gallop or rub normal S1 and S2 Abdomen: Nontender, nondistended, soft, bowel sounds positive, no rebound, no ascites, no appreciable mass Extremities: No significant cyanosis, clubbing, or edema bilateral lower extremities  Data Reviewed: Basic Metabolic Panel:  Recent Labs Lab 11/05/14 1526 11/06/14 0535  NA 142 142  K 3.4* 3.5  CL 113* 116*  CO2 19 19  GLUCOSE 103* 74  BUN 25* 21  CREATININE 1.44* 1.24  CALCIUM 8.3* 7.5*   Liver Function Tests:  Recent Labs Lab 11/05/14 1526  AST 27  ALT 17  ALKPHOS 64  BILITOT 0.7  PROT 6.6  ALBUMIN 3.2*    Recent Labs Lab 11/05/14 1526  LIPASE 21   No results for input(s): AMMONIA in the last 168 hours. CBC:  Recent Labs Lab 11/05/14 1526 11/06/14 0535  WBC 12.8* 10.0  NEUTROABS 10.2*  --   HGB 12.1* 10.7*  HCT 35.4* 31.7*  MCV 94.9 95.8  PLT 283 257   Cardiac Enzymes: No results for input(s): CKTOTAL, CKMB, CKMBINDEX, TROPONINI in the last 168 hours. BNP (last 3 results) No results for input(s):  PROBNP in the last 8760 hours. CBG: No results for input(s): GLUCAP in the last 168 hours.  No results found for this or any previous visit (from the past 240 hour(s)).   Studies:  Recent x-ray studies have been reviewed in detail by the Attending Physician  Scheduled Meds:  Scheduled Meds: . amitriptyline  25 mg Oral Q breakfast  . cloNIDine  0.2 mg Oral BID  . cycloSPORINE  1 drop Both Eyes BID  . dextromethorphan  15 mg Oral BID  . dutasteride  0.5 mg Oral Q breakfast   And  . tamsulosin  0.4 mg Oral Q breakfast  . enoxaparin (LOVENOX) injection  30 mg Subcutaneous Q24H  . escitalopram  20 mg Oral Q breakfast  . guaiFENesin  10 mL Oral TID  . lacosamide  150 mg Oral BID  . lisinopril  30 mg Oral Q breakfast  . LORazepam  1 mg Oral TID  . multivitamin with minerals  1 tablet Oral Q breakfast  . pantoprazole  40 mg Oral BID  . piperacillin-tazobactam (ZOSYN)  IV  3.375 g Intravenous 3 times per day  . prazosin  2 mg Oral QHS  . RESOURCE THICKENUP CLEAR   Oral BID WC  . sucralfate  1 g Oral BID  . vancomycin  750 mg Intravenous Q24H   Continuous Infusions: . sodium chloride 100 mL/hr at 11/05/14 1858    Time spent on care of this patient: 45 min   Mollye Guinta, MD 11/06/2014, 10:59 AM  LOS: 1 day   [facility] Office  [phone] Pager - Text Page per [email or web address]  If 7PM-7AM, please contact night-coverage [email or web address]

## 2014-11-06 NOTE — Progress Notes (Signed)
ANTIBIOTIC CONSULT NOTE   Pharmacy Consult for vancomycin and Zosyn Indication: rule out sepsis  No Known Allergies  Patient Measurements: Height: 5\' 7"  (170.2 cm) Weight: 115 lb (52.164 kg) IBW/kg (Calculated) : 66.1   Vital Signs: Temp: 97.9 F (36.6 C) (01/31 1351) Temp Source: Oral (01/31 1351) BP: 127/64 mmHg (01/31 1351) Pulse Rate: 92 (01/31 1351) Intake/Output from previous day: 01/30 0701 - 01/31 0700 In: 2703.3 [I.V.:2603.3; IV Piggyback:100] Out: 200 [Urine:200] Intake/Output from this shift:    Labs:  Recent Labs  11/05/14 1526 11/06/14 0535  WBC 12.8* 10.0  HGB 12.1* 10.7*  PLT 283 257  CREATININE 1.44* 1.24   Estimated Creatinine Clearance: 44.4 mL/min (by C-G formula based on Cr of 1.24).  Assessment: [age] yoM with MR admitted from NH after being found slumped over and lethargic by staff. Pt reported to have N/V/D yesterday. He was noted with AMS, hypoxia, and tachycardia in the ED. Pharmacy is consulted to dose vancomycin and Zosyn for sepsis.   1/30 >> vancomycin >> 1/30 >> Zosyn >>   Tmax: 102 WBCs: 12.8 Renal: SCr 1.24 (baseline unknown), CrCl 52 ml/min N  1/30 blood x2: IP 1/30 urine: IP  1/31 legionella Ag: 1/31 S. pneumo Ag:  Sputum: not collected C. Diff: not collected  Goal of Therapy:  Vancomycin trough level 15-20 mcg/ml Zosyn per indication and renal function  Plan:  - Continue Zosyn 3.375G IV q8h, each dose to be infused over 4 hours - Improved renal fx, change vancomcyin to 500mg  IV q12h - vancomycin trough at steady state if indicated - follow-up clinical course, culture results, renal function - follow-up antibiotic de-escalation and length of therapy  Juliette Alcideustin Zeigler, PharmD, BCPS.   Pager: [phone] 11/06/2014 3:02 PM

## 2014-11-07 LAB — LEGIONELLA ANTIGEN, URINE

## 2014-11-07 LAB — BASIC METABOLIC PANEL
Anion gap: 8 (ref 5–15)
BUN: 15 mg/dL (ref 6–23)
CO2: 20 mmol/L (ref 19–32)
Calcium: 7.9 mg/dL — ABNORMAL LOW (ref 8.4–10.5)
Chloride: 113 mmol/L — ABNORMAL HIGH (ref 96–112)
Creatinine, Ser: 1.22 mg/dL (ref 0.50–1.35)
GFR calc Af Amer: 71 mL/min — ABNORMAL LOW (ref >90)
GFR calc non Af Amer: 61 mL/min — ABNORMAL LOW (ref >90)
Glucose, Bld: 78 mg/dL (ref 70–99)
Potassium: 3.4 mmol/L — ABNORMAL LOW (ref 3.5–5.1)
Sodium: 141 mmol/L (ref 135–145)

## 2014-11-07 LAB — MAGNESIUM: Magnesium: 1.6 mg/dL (ref 1.5–2.5)

## 2014-11-07 MED ORDER — LORAZEPAM 1 MG PO TABS: 1.0000 mg | ORAL_TABLET | Freq: Three times a day (TID) | ORAL | Status: DC

## 2014-11-07 MED ORDER — CLONIDINE HCL 0.2 MG PO TABS: 0.2000 mg | ORAL_TABLET | Freq: Every day | ORAL | Status: DC

## 2014-11-07 MED ORDER — DEXTROMETHORPHAN POLISTIREX 30 MG/5ML PO LQCR: 15.0000 mg | Freq: Two times a day (BID) | ORAL | Status: DC

## 2014-11-07 MED ORDER — LISINOPRIL 30 MG PO TABS: 30.0000 mg | ORAL_TABLET | Freq: Every day | ORAL | Status: DC

## 2014-11-07 MED ORDER — LEVOFLOXACIN 500 MG PO TABS: 500.0000 mg | ORAL_TABLET | Freq: Every day | ORAL | Status: DC

## 2014-11-07 MED ORDER — RESOURCE THICKENUP CLEAR PO POWD: ORAL | Status: AC

## 2014-11-07 MED ORDER — POTASSIUM CHLORIDE ER 20 MEQ PO TBCR: 20.0000 meq | EXTENDED_RELEASE_TABLET | Freq: Every day | ORAL | Status: AC

## 2014-11-07 MED ORDER — LEVOFLOXACIN 500 MG PO TABS: 500.0000 mg | ORAL_TABLET | Freq: Every day | ORAL | Status: AC

## 2014-11-07 MED ORDER — CLONIDINE HCL 0.2 MG PO TABS: 0.2000 mg | ORAL_TABLET | Freq: Two times a day (BID) | ORAL | Status: AC

## 2014-11-07 MED ORDER — LISINOPRIL 10 MG PO TABS: 30.0000 mg | ORAL_TABLET | Freq: Every day | ORAL | Status: DC

## 2014-11-07 MED ORDER — ALBUTEROL SULFATE (2.5 MG/3ML) 0.083% IN NEBU: 2.5000 mg | INHALATION_SOLUTION | Freq: Four times a day (QID) | RESPIRATORY_TRACT | Status: AC | PRN

## 2014-11-07 MED ORDER — POTASSIUM CHLORIDE CRYS ER 20 MEQ PO TBCR
40.0000 meq | EXTENDED_RELEASE_TABLET | Freq: Once | ORAL | Status: AC
  Administered 2014-11-07: 40 meq via ORAL
  Filled 2014-11-07: qty 2

## 2014-11-07 MED ORDER — POTASSIUM CHLORIDE IN NACL 40-0.9 MEQ/L-% IV SOLN
INTRAVENOUS | Status: DC
  Administered 2014-11-07: 75 mL/h via INTRAVENOUS
  Filled 2014-11-07: qty 1000

## 2014-11-07 NOTE — Progress Notes (Signed)
Clinical Social Work  [facility] and spoke with Tammy who has reviewed information and is agreeable to accept patient today. CSW prepared DC packet with FL2, DC summary, hard scripts, and PTAR forms included. CSW left a message with guardian Junita Push(Dante Woolard [phone]) re: DC back to ALF. RN aware of DC plans. PTAR arranged for 4pm. PTAR #: [id number].  CSW is signing off but available if needed.  GroesbeckHolly Chanler [place], [place] [phone]

## 2014-11-07 NOTE — Clinical Documentation Improvement (Signed)
Presents with HCAP; possible aspiration pneumonia. Conflicting documentation regarding sepsis.   Patient febrile (102R), tachycardic (114,106), tachypneic (25), hypoxic (84%) in ED; placed on 2L FiO2  Sepsis protocol initialed in ED but not documented or on problem list by Medicine  WBC = 12.8  Source of infection: HCAP vs aspiration pneumonia  Vancomycin and Zosyn initiated  Please clarify if you feel Sepsis ruled in or ruled out, Respiratory Failure ruled in or ruled out and document findings in next progress note and include in discharge summary if applicable.  Thank You, Shellee MiloEileen T Hollister Wessler ,RN Clinical Documentation Specialist:  [phone]  [facility]- Health Information Management

## 2014-11-07 NOTE — Progress Notes (Signed)
UR complete 

## 2014-11-07 NOTE — Discharge Summary (Addendum)
Physician Discharge Summary  Colin Bradley MRN: [id number] DOB/AGE: Jun 12, 1951 64 y.o.  PCP: Sheela Stack, MD   Admit date: 11/05/2014 Discharge date: 11/07/2014  Discharge Diagnoses:      HCAP (healthcare-associated pneumonia) without sepsis Active Problems:   Mental retardation   Infantile cerebral palsy   Anemia   Seizure disorder  Follow-up recommendations Strict aspiration precautions Follow-up with PCP in 5-7 days Follow-up CBC, BMP in one week      Medication List    STOP taking these medications        HYDROcodone-acetaminophen 5-325 MG per tablet  Commonly known as:  NORCO     loperamide 2 MG capsule  Commonly known as:  IMODIUM      TAKE these medications        albuterol (2.5 MG/3ML) 0.083% nebulizer solution  Commonly known as:  PROVENTIL  Take 3 mLs (2.5 mg total) by nebulization every 6 (six) hours as needed for wheezing or shortness of breath.     amitriptyline 25 MG tablet  Commonly known as:  ELAVIL  Take 25 mg by mouth daily with breakfast.     cloNIDine 0.2 MG tablet  Commonly known as:  CATAPRES  Take 1 tablet (0.2 mg total) by mouth 2 (two) times daily.     cycloSPORINE 0.05 % ophthalmic emulsion  Commonly known as:  RESTASIS  Place 1 drop into both eyes 2 (two) times daily.     DAILY VITE PO  Take 1 tablet by mouth daily with breakfast.     dextromethorphan 30 MG/5ML liquid  Commonly known as:  DELSYM  Take 2.5 mLs (15 mg total) by mouth 2 (two) times daily.     escitalopram 20 MG tablet  Commonly known as:  LEXAPRO  Take 20 mg by mouth daily with breakfast.     food thickener Powd  Commonly known as:  THICK IT  Take 1 g by mouth 2 (two) times daily.     guaiFENesin 100 MG/5ML Soln  Commonly known as:  ROBITUSSIN  Take 10 mLs by mouth 3 (three) times daily. For 10 days     JALYN 0.5-0.4 MG Caps  Generic drug:  Dutasteride-Tamsulosin HCl  Take 1 tablet by mouth daily with breakfast.     levofloxacin 500 MG  tablet  Commonly known as:  LEVAQUIN  Take 1 tablet (500 mg total) by mouth daily.     lisinopril 30 MG tablet  Commonly known as:  PRINIVIL,ZESTRIL  Take 1 tablet (30 mg total) by mouth daily with breakfast.     LORazepam 1 MG tablet  Commonly known as:  ATIVAN  Take 1 tablet (1 mg total) by mouth 3 (three) times daily.     ondansetron 4 MG tablet  Commonly known as:  ZOFRAN  Take 4 mg by mouth 3 (three) times daily as needed for nausea or vomiting.     pantoprazole 40 MG tablet  Commonly known as:  PROTONIX  Take 1 tablet (40 mg total) by mouth 2 (two) times daily.     Potassium Chloride ER 20 MEQ Tbcr  Take 20 mEq by mouth daily.     prazosin 2 MG capsule  Commonly known as:  MINIPRESS  Take 2 mg by mouth at bedtime.     promethazine 12.5 MG tablet  Commonly known as:  PHENERGAN  Take 12.5 mg by mouth every 12 (twelve) hours as needed for nausea or vomiting.     [place]  2 times a day with meals     sucralfate 1 GM/10ML suspension  Commonly known as:  CARAFATE  Take 1 g by mouth 2 (two) times daily.     VIMPAT 150 MG Tabs  Generic drug:  Lacosamide  Take 150 mg by mouth 2 (two) times daily.        Discharge Condition:   Disposition: 01-Home or Self Care   Consults:   Significant Diagnostic Studies: Dg Chest Port 1 View  11/05/2014   CLINICAL DATA:  Fever.  History of seizure.  EXAM: PORTABLE CHEST - 1 VIEW  COMPARISON:  PA and lateral chest 06/17/2014.  FINDINGS: Airspace disease is seen the lung bases bilaterally and in the right mid lung zone. Mild elevation of the right hemidiaphragm relative to the left is unchanged. Heart size is upper normal.  IMPRESSION: Right worse than left airspace disease most worrisome for pneumonia.   Electronically Signed   By: Inge Rise M.D.   On: 11/05/2014 15:55      Microbiology: Recent Results (from the past 240 hour(s))  Culture, blood (routine x 2)     Status: None (Preliminary result)    Collection Time: 11/05/14  3:26 PM  Result Value Ref Range Status   Specimen Description BLOOD RIGHT WRIST  Final   Special Requests BOTTLES DRAWN AEROBIC AND ANAEROBIC 10 CC EACH  Final   Culture   Final           BLOOD CULTURE RECEIVED NO GROWTH TO DATE CULTURE WILL BE HELD FOR 5 DAYS BEFORE ISSUING A FINAL NEGATIVE REPORT Performed at [facility]    Report Status PENDING  Incomplete  Culture, blood (routine x 2)     Status: None (Preliminary result)   Collection Time: 11/05/14  3:26 PM  Result Value Ref Range Status   Specimen Description BLOOD LEFT HAND  Final   Special Requests BOTTLES DRAWN AEROBIC AND ANAEROBIC 5 CC EACH  Final   Culture   Final           BLOOD CULTURE RECEIVED NO GROWTH TO DATE CULTURE WILL BE HELD FOR 5 DAYS BEFORE ISSUING A FINAL NEGATIVE REPORT Performed at [facility]    Report Status PENDING  Incomplete  Urine culture     Status: None (Preliminary result)   Collection Time: 11/05/14  4:18 PM  Result Value Ref Range Status   Specimen Description URINE, CATHETERIZED  Final   Special Requests NONE  Final   Colony Count PENDING  Incomplete   Culture   Final    Culture reincubated for better growth Performed at [facility]    Report Status PENDING  Incomplete     Labs: Results for orders placed or performed during the hospital encounter of 11/05/14 (from the past 48 hour(s))  CBC WITH DIFFERENTIAL     Status: Abnormal   Collection Time: 11/05/14  3:26 PM  Result Value Ref Range   WBC 12.8 (H) 4.0 - 10.5 K/uL   RBC 3.73 (L) 4.22 - 5.81 MIL/uL   Hemoglobin 12.1 (L) 13.0 - 17.0 g/dL   HCT 35.4 (L) 39.0 - 52.0 %   MCV 94.9 78.0 - 100.0 fL   MCH 32.4 26.0 - 34.0 pg   MCHC 34.2 30.0 - 36.0 g/dL   RDW 13.2 11.5 - 15.5 %   Platelets 283 150 - 400 K/uL   Neutrophils Relative % 80 (H) 43 - 77 %   Neutro Abs 10.2 (H) 1.7 - 7.7  K/uL   Lymphocytes Relative 12 12 - 46 %   Lymphs Abs 1.5 0.7 - 4.0 K/uL   Monocytes Relative 7 3  - 12 %   Monocytes Absolute 0.9 0.1 - 1.0 K/uL   Eosinophils Relative 1 0 - 5 %   Eosinophils Absolute 0.2 0.0 - 0.7 K/uL   Basophils Relative 0 0 - 1 %   Basophils Absolute 0.0 0.0 - 0.1 K/uL  Comprehensive metabolic panel     Status: Abnormal   Collection Time: 11/05/14  3:26 PM  Result Value Ref Range   Sodium 142 135 - 145 mmol/L   Potassium 3.4 (L) 3.5 - 5.1 mmol/L   Chloride 113 (H) 96 - 112 mmol/L   CO2 19 19 - 32 mmol/L   Glucose, Bld 103 (H) 70 - 99 mg/dL   BUN 25 (H) 6 - 23 mg/dL   Creatinine, Ser 1.44 (H) 0.50 - 1.35 mg/dL   Calcium 8.3 (L) 8.4 - 10.5 mg/dL   Total Protein 6.6 6.0 - 8.3 g/dL   Albumin 3.2 (L) 3.5 - 5.2 g/dL   AST 27 0 - 37 U/L   ALT 17 0 - 53 U/L   Alkaline Phosphatase 64 39 - 117 U/L   Total Bilirubin 0.7 0.3 - 1.2 mg/dL   GFR calc non Af Amer 50 (L) >90 mL/min   GFR calc Af Amer 58 (L) >90 mL/min    Comment: (NOTE) The eGFR has been calculated using the CKD EPI equation. This calculation has not been validated in all clinical situations. eGFR's persistently <90 mL/min signify possible Chronic Kidney Disease.    Anion gap 10 5 - 15  Culture, blood (routine x 2)     Status: None (Preliminary result)   Collection Time: 11/05/14  3:26 PM  Result Value Ref Range   Specimen Description BLOOD RIGHT WRIST    Special Requests BOTTLES DRAWN AEROBIC AND ANAEROBIC 10 CC EACH    Culture             BLOOD CULTURE RECEIVED NO GROWTH TO DATE CULTURE WILL BE HELD FOR 5 DAYS BEFORE ISSUING A FINAL NEGATIVE REPORT Performed at [facility]    Report Status PENDING   Culture, blood (routine x 2)     Status: None (Preliminary result)   Collection Time: 11/05/14  3:26 PM  Result Value Ref Range   Specimen Description BLOOD LEFT HAND    Special Requests BOTTLES DRAWN AEROBIC AND ANAEROBIC 5 CC EACH    Culture             BLOOD CULTURE RECEIVED NO GROWTH TO DATE CULTURE WILL BE HELD FOR 5 DAYS BEFORE ISSUING A FINAL NEGATIVE REPORT Performed at [facility]    Report Status PENDING   Lipase, blood     Status: None   Collection Time: 11/05/14  3:26 PM  Result Value Ref Range   Lipase 21 11 - 59 U/L  I-Stat CG4 Lactic Acid, ED     Status: None   Collection Time: 11/05/14  3:59 PM  Result Value Ref Range   Lactic Acid, Venous 1.98 0.5 - 2.0 mmol/L  I-Stat Troponin, ED (not at [facility])     Status: None   Collection Time: 11/05/14  4:03 PM  Result Value Ref Range   Troponin i, poc 0.00 0.00 - 0.08 ng/mL   Comment 3            Comment: Due to the release kinetics of cTnI,  a negative result within the first hours of the onset of symptoms does not rule out myocardial infarction with certainty. If myocardial infarction is still suspected, repeat the test at appropriate intervals.   Urinalysis with microscopic     Status: Abnormal   Collection Time: 11/05/14  4:18 PM  Result Value Ref Range   Color, Urine AMBER (A) YELLOW    Comment: BIOCHEMICALS MAY BE AFFECTED BY COLOR   APPearance CLOUDY (A) CLEAR   Specific Gravity, Urine 1.024 1.005 - 1.030   pH 5.0 5.0 - 8.0   Glucose, UA NEGATIVE NEGATIVE mg/dL   Hgb urine dipstick NEGATIVE NEGATIVE   Bilirubin Urine SMALL (A) NEGATIVE   Ketones, ur NEGATIVE NEGATIVE mg/dL   Protein, ur NEGATIVE NEGATIVE mg/dL   Urobilinogen, UA 1.0 0.0 - 1.0 mg/dL   Nitrite NEGATIVE NEGATIVE   Leukocytes, UA NEGATIVE NEGATIVE    Comment: MICROSCOPIC NOT DONE ON URINES WITH NEGATIVE PROTEIN, BLOOD, LEUKOCYTES, NITRITE, OR GLUCOSE <1000 mg/dL.  Urine culture     Status: None (Preliminary result)   Collection Time: 11/05/14  4:18 PM  Result Value Ref Range   Specimen Description URINE, CATHETERIZED    Special Requests NONE    Colony Count PENDING    Culture      Culture reincubated for better growth Performed at [facility]    Report Status PENDING   Phenytoin level, total     Status: Abnormal   Collection Time: 11/05/14  5:16 PM  Result Value Ref Range   Phenytoin Lvl <2.5 (L) 10.0 -  20.0 ug/mL    Comment: Performed at [facility] metabolic panel     Status: Abnormal   Collection Time: 11/06/14  5:35 AM  Result Value Ref Range   Sodium 142 135 - 145 mmol/L   Potassium 3.5 3.5 - 5.1 mmol/L   Chloride 116 (H) 96 - 112 mmol/L   CO2 19 19 - 32 mmol/L   Glucose, Bld 74 70 - 99 mg/dL   BUN 21 6 - 23 mg/dL   Creatinine, Ser 1.24 0.50 - 1.35 mg/dL   Calcium 7.5 (L) 8.4 - 10.5 mg/dL   GFR calc non Af Amer 60 (L) >90 mL/min   GFR calc Af Amer 69 (L) >90 mL/min    Comment: (NOTE) The eGFR has been calculated using the CKD EPI equation. This calculation has not been validated in all clinical situations. eGFR's persistently <90 mL/min signify possible Chronic Kidney Disease.    Anion gap 7 5 - 15  CBC     Status: Abnormal   Collection Time: 11/06/14  5:35 AM  Result Value Ref Range   WBC 10.0 4.0 - 10.5 K/uL   RBC 3.31 (L) 4.22 - 5.81 MIL/uL   Hemoglobin 10.7 (L) 13.0 - 17.0 g/dL   HCT 31.7 (L) 39.0 - 52.0 %   MCV 95.8 78.0 - 100.0 fL   MCH 32.3 26.0 - 34.0 pg   MCHC 33.8 30.0 - 36.0 g/dL   RDW 13.5 11.5 - 15.5 %   Platelets 257 150 - 400 K/uL  Strep pneumoniae urinary antigen     Status: None   Collection Time: 11/06/14  6:27 AM  Result Value Ref Range   Strep Pneumo Urinary Antigen NEGATIVE NEGATIVE    Comment:        Infection due to S. pneumoniae cannot be absolutely ruled out since the antigen present may be below the detection limit of the test. Performed at [facility]  [place] metabolic panel     Status: Abnormal   Collection Time: 11/07/14  5:35 AM  Result Value Ref Range   Sodium 141 135 - 145 mmol/L   Potassium 3.4 (L) 3.5 - 5.1 mmol/L   Chloride 113 (H) 96 - 112 mmol/L   CO2 20 19 - 32 mmol/L   Glucose, Bld 78 70 - 99 mg/dL   BUN 15 6 - 23 mg/dL   Creatinine, Ser 1.22 0.50 - 1.35 mg/dL   Calcium 7.9 (L) 8.4 - 10.5 mg/dL   GFR calc non Af Amer 61 (L) >90 mL/min   GFR calc Af Amer 71 (L) >90 mL/min    Comment:  (NOTE) The eGFR has been calculated using the CKD EPI equation. This calculation has not been validated in all clinical situations. eGFR's persistently <90 mL/min signify possible Chronic Kidney Disease.    Anion gap 8 5 - 15  Magnesium     Status: None   Collection Time: 11/07/14  5:55 AM  Result Value Ref Range   Magnesium 1.6 1.5 - 2.5 mg/dL     HPI :Colin Bradley is a 64 y.o. male with Cerebral palsy, developmental delay, seizure disorder, HTN, MR, GI bleed comes in after being found lethargic at the nursing home. He is not able to give me a history and therefore, history is obtained from the ER PA and the chart. He has been having vomiting and diarrhea for a number of days. In the ER he is found to be febrile and CXR reveals a pneumonia. He is hypoxic with a pulse ox of 80s on room air.  HOSPITAL COURSE:  HCAP (healthcare-associated pneumonia) - with a high possibility of aspirating on vomitus Initially treated with Vanc Zosyn-2 days Switched to Levaquin 7 days No evidence of sepsis upon admission Aspiration precautions Speech therapy recommends dysphagia 3 diet with nectar thick liquids  ARF/ dehydration - due to vomiting and diarrhea-  -resolving with hydration via IV  Hypokalemia - likely from diarrhea/ poor PO intake Recheck BMP in one week  Vomiting and diarrhea - benign abdominal exam, diarrhea resolved, C. difficile canceled because of no diarrhea in the hospital - possible gastroenteritis -  - tolerating full liquids- advance to regular diet now   Mental retardation  Infantile cerebral palsy   Seizure disorder - cont Vimpat and Dilantin   Code Status: Full code    Discharge Exam: *   Blood pressure 148/83, pulse 91, temperature 98.5 F (36.9 C), temperature source Oral, resp. rate 16, height _0  (1.702 m), weight 52.164 kg (115 lb), SpO2 95 %.  General: alert, No acute respiratory distress Lungs: Clear to auscultation bilaterally without  wheezes or crackles Cardiovascular: Regular rate and rhythm without murmur gallop or rub normal S1 and S2 Abdomen: Nontender, nondistended, soft, bowel sounds positive, no rebound, no ascites, no appreciable mass Extremities: No significant cyanosis, clubbing, or edema bilateral lower extremities        Discharge Instructions    Diet - low sodium heart healthy    Complete by:  As directed      Increase activity slowly    Complete by:  As directed              Signed: Kinesha Auten 11/07/2014, 12:40 PM

## 2014-11-07 NOTE — Evaluation (Signed)
Clinical/Bedside Swallow Evaluation Patient Details  Name: Colin Bradley MRN: [id number] Date of Birth: 1951/10/02  Today's Date: 11/07/2014 Time: SLP Start Time (ACUTE ONLY): 1025 SLP Stop Time (ACUTE ONLY): 1050 SLP Time Calculation (min) (ACUTE ONLY): 25 min  Past Medical History:  Past Medical History  Diagnosis Date  . Seizures   . Cerebral palsy   . Hypertension   . Mental retardation   . Depression   . Nephrolithiasis   . BPH (benign prostatic hyperplasia)   . GI bleed 2009    coffee ground emesis, erosive esophagitis on EGD by Dr Juanda ChanceBrodie  . Esophageal stricture 2009    not dilated during the EGD, biopsy benign:  no Barretts  . Depression with anxiety    Past Surgical History:  Past Surgical History  Procedure Laterality Date  . Basal cell carcinoma excision  2002    forehead  . Squamous cell carcinoma excision  2004    lip  . Bowen dz  2002    right cheek   . Esophagogastroduodenoscopy  06/14/2012    Procedure: ESOPHAGOGASTRODUODENOSCOPY (EGD);  Surgeon: Rachael Feeaniel P Jacobs, MD;  [facility];  Service: Endoscopy;  Laterality: N/A;  . Multiple extractions with alveoloplasty N/A 06/20/2014    Procedure: MULTIPLE EXTRACTIONS TEETH NUMBER TWO, TEN, TWELVE, EIGHTEEN, TWENTY-ONE, TWENTY-TWO, TWENTY-NINE, THIRTY-TWO;  Surgeon: Georgia LopesScott M Jensen, DDS;  Location: [facility] OR;  Service: Oral Surgery;  Laterality: N/A;   HPI:  64 yo male [facility] with N/V PTA, ? aspiration of emesis.  PMH + for CXR right more than left ASD - concern for pna.  Pt has MR and resides at ALF.  Swallow eval ordered.    Assessment / Plan / Recommendation Clinical Impression  Pt with mild facial asymmetry on left but otherwise no focal CN deficits impacting swallow musculature.  Observe pt consuming thin, nectar, puree and solids.  Pt without indications of airway compromise, he does become distracted easily with people in the room and would talk while trying to masticate.  Rec continue mechanical  soft/nectar diet with intermittent supervision.  Uncertain why pt on nectar at SNF, recommend follow up to determine if he may qualify for dietary advancement.     Aspiration Risk  Mild    Diet Recommendation Dysphagia 3 (Mechanical Soft);Nectar-thick liquid   Liquid Administration via: Cup;Straw Medication Administration: Whole meds with puree Supervision: Patient able to self feed;Intermittent supervision to cue for compensatory strategies Compensations: Slow rate;Small sips/bites Postural Changes and/or Swallow Maneuvers: Seated upright 90 degrees;Upright 30-60 min after meal    Other  Recommendations Oral Care Recommendations: Oral care BID   Follow Up Recommendations  Home health SLP    Frequency and Duration        Pertinent Vitals/Pain Afebrile, decreased     Swallow Study Prior Functional Status    on modified diet PTA, RN reports tolerance of po medications today and no further N/V   General Date of Onset: 11/07/14 HPI: 64 yo male [facility] with N/V PTA, ? aspiration of emesis.  PMH + for CXR right more than left ASD - concern for pna.  Pt has MR and resides at ALF.  Swallow eval ordered.  Type of Study: Bedside swallow evaluation Previous Swallow Assessment: EGD - esoph stricture 2013 Diet Prior to this Study: Dysphagia 3 (soft);Nectar-thick liquids Respiratory Status: Room air History of Recent Intubation: No Behavior/Cognition: Alert;Cooperative;Pleasant mood Oral Cavity - Dentition: Adequate natural dentition Self-Feeding Abilities: Able to feed self Patient Positioning: Upright  in bed Baseline Vocal Quality: Clear Volitional Cough: Weak Volitional Swallow: Able to elicit    Oral/Motor/Sensory Function Overall Oral Motor/Sensory Function: Appears within functional limits for tasks assessed (except ? mild left facial asymmetry)   Ice Chips Ice chips: Not tested   Thin Liquid Thin Liquid: Within functional limits Presentation: Cup Other Comments: soda     Nectar Thick Nectar Thick Liquid: Within functional limits Presentation: Cup;Straw;Self Fed   Honey Thick Honey Thick Liquid: Not tested   Puree Puree: Within functional limits Presentation: Self Fed;Spoon   Solid   GO    Solid: Within functional limits Presentation: Self Fed Other Comments: mildly slow mastication and pt becomes distracted easily, but functional       Donavan Burnet, MS [facility] SLP [phone]

## 2014-11-07 NOTE — Progress Notes (Signed)
Clinical Social Work Department BRIEF PSYCHOSOCIAL ASSESSMENT 11/07/2014  Patient:  Colin Bradley,Colin Bradley     Account Number:  [id number]     Admit date:  11/05/2014  Clinical Social Worker:  Dennison BullaGERBER,Zebediah Beezley, LCSW  Date/Time:  11/07/2014 09:45 AM  Referred by:  Physician  Date Referred:  11/07/2014 Referred for  ALF Placement   Other Referral:   Interview type:  Other - See comment Other interview type:   Facility-Tammy at ALF    PSYCHOSOCIAL DATA Living Status:  FACILITY Admitted from [facility] Level of care:  Assisted Living Primary support name:  Junita PushDante Woolard Primary support relationship to patient:  NONE Degree of support available:   Guardian through DSS    CURRENT CONCERNS Current Concerns  Post-Acute Placement   Other Concerns:    SOCIAL WORK ASSESSMENT / PLAN CSW received referral due to patient being admitted from assisted living. CSW reviewed chart and patient is only alert to self. CSW spoke with Tammy at ALF and left a message for DSS guardian.    ALF reports that patient has lived with them for several years. Patient requires assistance to transfer to wheelchair but was able to propel himself in his wheelchair. ALF has to assist with feeding and bathing. ALF reports that mother has passed away and father recently had to be placed in SNF. Father signed rights over to DSS guardian because he wanted to ensure that patient was being cared for if he were to pass away. ALF reports that they want patient to return to their facility when he is medically stable.    Per chart review, patient has guardian through DSS Hoover Brunette(Roxane Brand [phone]) but her voicemail reports she is on medical leave and to contact Junita PushDante Woolard [phone] for assistance. CSW left a message with Ms. Woolard to discuss patient.    CSW completed FL2 and will continue to follow.   Assessment/plan status:  Psychosocial Support/Ongoing Assessment of Needs Other assessment/ plan:    Information/referral to community resources:   Will return to ALF    PATIENT'S/FAMILY'S RESPONSE TO PLAN OF CARE: Patient unable to fully participate in assessment. ALF engaged in assessment and reports they want patient to return at DC because he needs a stable environment and is comfortable living there. ALF aware that guardian needs to approve DC plans. ALF reports they do not need to evaluate patient prior to return. ALF thanked CSW for update on patient.       TrimbleHolly Geno Sydnor, [place] [phone]

## 2014-11-08 LAB — URINE CULTURE: Colony Count: 1000

## 2014-11-08 LAB — HIV ANTIBODY (ROUTINE TESTING W REFLEX): HIV Screen 4th Generation wRfx: NONREACTIVE

## 2014-11-11 LAB — CULTURE, BLOOD (ROUTINE X 2)
Culture: NO GROWTH
Culture: NO GROWTH

## 2014-12-02 ENCOUNTER — Emergency Department (HOSPITAL_COMMUNITY): Payer: Medicare Other

## 2014-12-02 ENCOUNTER — Encounter (HOSPITAL_COMMUNITY): Payer: Self-pay | Admitting: Emergency Medicine

## 2014-12-02 ENCOUNTER — Observation Stay (HOSPITAL_COMMUNITY): Payer: Medicare Other

## 2014-12-02 ENCOUNTER — Inpatient Hospital Stay (HOSPITAL_COMMUNITY)
Admission: EM | Admit: 2014-12-02 | Discharge: 2014-12-07 | DRG: 871 | Disposition: A | Discharge status: Skilled Nursing Facility | Payer: Medicare Other | Attending: Internal Medicine | Admitting: Internal Medicine

## 2014-12-02 DIAGNOSIS — N390 Urinary tract infection, site not specified: Secondary | ICD-10-CM | POA: Diagnosis present

## 2014-12-02 DIAGNOSIS — R0602 Shortness of breath: Secondary | ICD-10-CM

## 2014-12-02 DIAGNOSIS — R112 Nausea with vomiting, unspecified: Secondary | ICD-10-CM | POA: Diagnosis not present

## 2014-12-02 DIAGNOSIS — A419 Sepsis, unspecified organism: Secondary | ICD-10-CM | POA: Diagnosis not present

## 2014-12-02 DIAGNOSIS — J690 Pneumonitis due to inhalation of food and vomit: Secondary | ICD-10-CM | POA: Diagnosis present

## 2014-12-02 DIAGNOSIS — J9600 Acute respiratory failure, unspecified whether with hypoxia or hypercapnia: Secondary | ICD-10-CM | POA: Diagnosis not present

## 2014-12-02 DIAGNOSIS — Z993 Dependence on wheelchair: Secondary | ICD-10-CM

## 2014-12-02 DIAGNOSIS — R636 Underweight: Secondary | ICD-10-CM | POA: Diagnosis present

## 2014-12-02 DIAGNOSIS — T68XXXA Hypothermia, initial encounter: Secondary | ICD-10-CM | POA: Diagnosis present

## 2014-12-02 DIAGNOSIS — E876 Hypokalemia: Secondary | ICD-10-CM | POA: Diagnosis not present

## 2014-12-02 DIAGNOSIS — F329 Major depressive disorder, single episode, unspecified: Secondary | ICD-10-CM | POA: Diagnosis present

## 2014-12-02 DIAGNOSIS — I10 Essential (primary) hypertension: Secondary | ICD-10-CM | POA: Diagnosis present

## 2014-12-02 DIAGNOSIS — G40909 Epilepsy, unspecified, not intractable, without status epilepticus: Secondary | ICD-10-CM

## 2014-12-02 DIAGNOSIS — G9340 Encephalopathy, unspecified: Secondary | ICD-10-CM | POA: Diagnosis present

## 2014-12-02 DIAGNOSIS — R4182 Altered mental status, unspecified: Secondary | ICD-10-CM | POA: Diagnosis not present

## 2014-12-02 DIAGNOSIS — J989 Respiratory disorder, unspecified: Secondary | ICD-10-CM

## 2014-12-02 DIAGNOSIS — N400 Enlarged prostate without lower urinary tract symptoms: Secondary | ICD-10-CM | POA: Diagnosis present

## 2014-12-02 DIAGNOSIS — B9620 Unspecified Escherichia coli [E. coli] as the cause of diseases classified elsewhere: Secondary | ICD-10-CM | POA: Diagnosis present

## 2014-12-02 DIAGNOSIS — F32A Depression, unspecified: Secondary | ICD-10-CM | POA: Diagnosis present

## 2014-12-02 DIAGNOSIS — Z85828 Personal history of other malignant neoplasm of skin: Secondary | ICD-10-CM

## 2014-12-02 DIAGNOSIS — F79 Unspecified intellectual disabilities: Secondary | ICD-10-CM

## 2014-12-02 DIAGNOSIS — Z681 Body mass index (BMI) 19 or less, adult: Secondary | ICD-10-CM

## 2014-12-02 DIAGNOSIS — G809 Cerebral palsy, unspecified: Secondary | ICD-10-CM | POA: Diagnosis present

## 2014-12-02 DIAGNOSIS — R404 Transient alteration of awareness: Secondary | ICD-10-CM

## 2014-12-02 DIAGNOSIS — T428X5A Adverse effect of antiparkinsonism drugs and other central muscle-tone depressants, initial encounter: Secondary | ICD-10-CM | POA: Diagnosis present

## 2014-12-02 DIAGNOSIS — G92 Toxic encephalopathy: Secondary | ICD-10-CM | POA: Diagnosis present

## 2014-12-02 DIAGNOSIS — R1110 Vomiting, unspecified: Secondary | ICD-10-CM

## 2014-12-02 LAB — CBC WITH DIFFERENTIAL/PLATELET
BASOS ABS: 0 10*3/uL (ref 0.0–0.1)
Basophils Relative: 0 % (ref 0–1)
Eosinophils Absolute: 0.4 10*3/uL (ref 0.0–0.7)
Eosinophils Relative: 8 % — ABNORMAL HIGH (ref 0–5)
HCT: 38.4 % — ABNORMAL LOW (ref 39.0–52.0)
Hemoglobin: 13.3 g/dL (ref 13.0–17.0)
LYMPHS PCT: 27 % (ref 12–46)
Lymphs Abs: 1.4 10*3/uL (ref 0.7–4.0)
MCH: 32.1 pg (ref 26.0–34.0)
MCHC: 34.6 g/dL (ref 30.0–36.0)
MCV: 92.8 fL (ref 78.0–100.0)
Monocytes Absolute: 0.5 10*3/uL (ref 0.1–1.0)
Monocytes Relative: 10 % (ref 3–12)
NEUTROS ABS: 2.9 10*3/uL (ref 1.7–7.7)
NEUTROS PCT: 55 % (ref 43–77)
PLATELETS: 160 10*3/uL (ref 150–400)
RBC: 4.14 MIL/uL — ABNORMAL LOW (ref 4.22–5.81)
RDW: 13.5 % (ref 11.5–15.5)
WBC: 5.2 10*3/uL (ref 4.0–10.5)

## 2014-12-02 LAB — COMPREHENSIVE METABOLIC PANEL
ALT: 21 U/L (ref 0–53)
AST: 32 U/L (ref 0–37)
Albumin: 3.4 g/dL — ABNORMAL LOW (ref 3.5–5.2)
Alkaline Phosphatase: 85 U/L (ref 39–117)
Anion gap: 8 (ref 5–15)
BUN: 12 mg/dL (ref 6–23)
CALCIUM: 9.3 mg/dL (ref 8.4–10.5)
CO2: 24 mmol/L (ref 19–32)
Chloride: 106 mmol/L (ref 96–112)
Creatinine, Ser: 1.04 mg/dL (ref 0.50–1.35)
GFR calc Af Amer: 86 mL/min — ABNORMAL LOW (ref >90)
GFR, EST NON AFRICAN AMERICAN: 74 mL/min — AB (ref >90)
Glucose, Bld: 102 mg/dL — ABNORMAL HIGH (ref 70–99)
Potassium: 3.6 mmol/L (ref 3.5–5.1)
SODIUM: 138 mmol/L (ref 135–145)
Total Bilirubin: 0.6 mg/dL (ref 0.3–1.2)
Total Protein: 6.6 g/dL (ref 6.0–8.3)

## 2014-12-02 LAB — URINALYSIS, ROUTINE W REFLEX MICROSCOPIC
BILIRUBIN URINE: NEGATIVE
GLUCOSE, UA: NEGATIVE mg/dL
HGB URINE DIPSTICK: NEGATIVE
Ketones, ur: NEGATIVE mg/dL
Leukocytes, UA: NEGATIVE
Nitrite: NEGATIVE
Protein, ur: NEGATIVE mg/dL
SPECIFIC GRAVITY, URINE: 1.015 (ref 1.005–1.030)
Urobilinogen, UA: 0.2 mg/dL (ref 0.0–1.0)
pH: 6 (ref 5.0–8.0)

## 2014-12-02 LAB — MRSA PCR SCREENING: MRSA by PCR: POSITIVE — AB

## 2014-12-02 LAB — TSH: TSH: 2.167 u[IU]/mL (ref 0.350–4.500)

## 2014-12-02 LAB — TROPONIN I: Troponin I: <0.03 ng/mL (ref <0.031)

## 2014-12-02 LAB — I-STAT CG4 LACTIC ACID, ED: Lactic Acid, Venous: 0.85 mmol/L (ref 0.5–2.0)

## 2014-12-02 MED ORDER — RESOURCE THICKENUP CLEAR PO POWD
ORAL | Status: DC | PRN
  Filled 2014-12-02 (×2): qty 125

## 2014-12-02 MED ORDER — PROMETHAZINE HCL 25 MG PO TABS: 12.5000 mg | ORAL_TABLET | Freq: Two times a day (BID) | ORAL | Status: DC | PRN

## 2014-12-02 MED ORDER — SUCRALFATE 1 GM/10ML PO SUSP: 1.0000 g | Freq: Two times a day (BID) | ORAL | Status: DC

## 2014-12-02 MED ORDER — ADULT MULTIVITAMIN W/MINERALS CH
ORAL_TABLET | Freq: Every day | ORAL | Status: DC
  Administered 2014-12-03 – 2014-12-07 (×4): 1 via ORAL
  Filled 2014-12-02 (×6): qty 1

## 2014-12-02 MED ORDER — PRAZOSIN HCL 2 MG PO CAPS
2.0000 mg | ORAL_CAPSULE | Freq: Every day | ORAL | Status: DC
  Administered 2014-12-02 – 2014-12-06 (×3): 2 mg via ORAL
  Filled 2014-12-02 (×7): qty 1

## 2014-12-02 MED ORDER — ONDANSETRON HCL 4 MG/2ML IJ SOLN
4.0000 mg | Freq: Four times a day (QID) | INTRAMUSCULAR | Status: DC | PRN
  Administered 2014-12-03 (×2): 4 mg via INTRAVENOUS
  Filled 2014-12-02 (×2): qty 2

## 2014-12-02 MED ORDER — CLONIDINE HCL 0.2 MG PO TABS
0.2000 mg | ORAL_TABLET | Freq: Two times a day (BID) | ORAL | Status: DC
  Administered 2014-12-02 – 2014-12-07 (×7): 0.2 mg via ORAL
  Filled 2014-12-02 (×3): qty 1
  Filled 2014-12-02: qty 2
  Filled 2014-12-02 (×7): qty 1
  Filled 2014-12-02: qty 2
  Filled 2014-12-02: qty 1

## 2014-12-02 MED ORDER — LACOSAMIDE 50 MG PO TABS
150.0000 mg | ORAL_TABLET | Freq: Two times a day (BID) | ORAL | Status: DC
  Administered 2014-12-02 – 2014-12-03 (×2): 150 mg via ORAL
  Filled 2014-12-02 (×4): qty 3

## 2014-12-02 MED ORDER — ONDANSETRON HCL 4 MG PO TABS: 4.0000 mg | ORAL_TABLET | Freq: Four times a day (QID) | ORAL | Status: DC | PRN

## 2014-12-02 MED ORDER — STARCH (THICKENING) PO POWD: 1.0000 g | Freq: Two times a day (BID) | ORAL | Status: DC

## 2014-12-02 MED ORDER — ALBUTEROL SULFATE (2.5 MG/3ML) 0.083% IN NEBU: 2.5000 mg | INHALATION_SOLUTION | Freq: Four times a day (QID) | RESPIRATORY_TRACT | Status: DC | PRN

## 2014-12-02 MED ORDER — PANTOPRAZOLE SODIUM 40 MG PO TBEC
40.0000 mg | DELAYED_RELEASE_TABLET | Freq: Two times a day (BID) | ORAL | Status: DC
Start: 2014-12-02 — End: 2014-12-03
  Administered 2014-12-02 – 2014-12-03 (×2): 40 mg via ORAL
  Filled 2014-12-02 (×2): qty 1

## 2014-12-02 MED ORDER — LORAZEPAM 1 MG PO TABS: 1.0000 mg | ORAL_TABLET | Freq: Four times a day (QID) | ORAL | Status: DC | PRN

## 2014-12-02 MED ORDER — DUTASTERIDE 0.5 MG PO CAPS
0.5000 mg | ORAL_CAPSULE | Freq: Every day | ORAL | Status: DC
  Administered 2014-12-05 – 2014-12-06 (×2): 0.5 mg via ORAL
  Filled 2014-12-02 (×6): qty 1

## 2014-12-02 MED ORDER — CYCLOSPORINE 0.05 % OP EMUL
1.0000 [drp] | Freq: Two times a day (BID) | OPHTHALMIC | Status: DC
  Administered 2014-12-02 – 2014-12-06 (×9): 1 [drp] via OPHTHALMIC
  Filled 2014-12-02 (×13): qty 1

## 2014-12-02 MED ORDER — ACETAMINOPHEN 325 MG PO TABS: 650.0000 mg | ORAL_TABLET | Freq: Four times a day (QID) | ORAL | Status: DC | PRN

## 2014-12-02 MED ORDER — ESCITALOPRAM OXALATE 20 MG PO TABS
20.0000 mg | ORAL_TABLET | Freq: Every day | ORAL | Status: DC
  Administered 2014-12-05 – 2014-12-07 (×3): 20 mg via ORAL
  Filled 2014-12-02 (×6): qty 1

## 2014-12-02 MED ORDER — ACETAMINOPHEN 650 MG RE SUPP
650.0000 mg | Freq: Four times a day (QID) | RECTAL | Status: DC | PRN
  Administered 2014-12-03 – 2014-12-05 (×2): 650 mg via RECTAL
  Filled 2014-12-02 (×2): qty 1

## 2014-12-02 MED ORDER — ENOXAPARIN SODIUM 40 MG/0.4ML ~~LOC~~ SOLN
40.0000 mg | SUBCUTANEOUS | Status: DC
  Administered 2014-12-02 – 2014-12-06 (×5): 40 mg via SUBCUTANEOUS
  Filled 2014-12-02 (×6): qty 0.4

## 2014-12-02 MED ORDER — DEXTROSE-NACL 5-0.45 % IV SOLN
INTRAVENOUS | Status: AC
Start: 2014-12-02 — End: 2014-12-03
  Administered 2014-12-02: 1000 mL via INTRAVENOUS
  Administered 2014-12-03: 03:00:00 via INTRAVENOUS

## 2014-12-02 MED ORDER — LISINOPRIL 20 MG PO TABS
30.0000 mg | ORAL_TABLET | Freq: Every day | ORAL | Status: DC
  Administered 2014-12-03: 30 mg via ORAL
  Filled 2014-12-02 (×3): qty 1

## 2014-12-02 MED ORDER — FAMOTIDINE 20 MG PO TABS
20.0000 mg | ORAL_TABLET | Freq: Two times a day (BID) | ORAL | Status: DC
  Administered 2014-12-02 – 2014-12-03 (×2): 20 mg via ORAL
  Filled 2014-12-02 (×5): qty 1

## 2014-12-02 MED ORDER — MUPIROCIN 2 % EX OINT
1.0000 "application " | TOPICAL_OINTMENT | Freq: Two times a day (BID) | CUTANEOUS | Status: AC
  Administered 2014-12-02 – 2014-12-07 (×10): 1 via NASAL
  Filled 2014-12-02 (×4): qty 22

## 2014-12-02 MED ORDER — ONDANSETRON HCL 4 MG PO TABS: 4.0000 mg | ORAL_TABLET | Freq: Three times a day (TID) | ORAL | Status: DC | PRN

## 2014-12-02 MED ORDER — CHLORHEXIDINE GLUCONATE CLOTH 2 % EX PADS
6.0000 | MEDICATED_PAD | Freq: Every day | CUTANEOUS | Status: DC
  Administered 2014-12-04 – 2014-12-07 (×4): 6 via TOPICAL

## 2014-12-02 MED ORDER — TAMSULOSIN HCL 0.4 MG PO CAPS
0.4000 mg | ORAL_CAPSULE | Freq: Every day | ORAL | Status: DC
  Administered 2014-12-05 – 2014-12-06 (×2): 0.4 mg via ORAL
  Filled 2014-12-02 (×6): qty 1

## 2014-12-02 MED ORDER — DUTASTERIDE-TAMSULOSIN HCL 0.5-0.4 MG PO CAPS: 1.0000 | ORAL_CAPSULE | Freq: Every day | ORAL | Status: DC

## 2014-12-02 NOTE — ED Notes (Signed)
Attempted report x1. 

## 2014-12-02 NOTE — H&P (Addendum)
[facility] History and Physical  Sharion BalloonJerry W Debord GNF:[id number] DOB: May 30, 1951 DOA: 12/02/2014  Referring physician: ED PCP: Dr Florentina JennyHenry Tripp  Chief Complaint:  Altered mental status x 1 day  History provided by Judithann SaugerPorsha , nursing staff at  [facility]  HPI:  64 year old male resident of [facility] with past medical hx cerebral palsy with mental retardation,  developmental delay, seizure disorder, hypertension, history of GI bleed with recent hospitalization for altered mental status secondary to healthcare associated pneumonia and dehydration sent to the ED this morning for altered mental status since last evening.  At baseline patient is  well oriented and very talkative. He has been wheelchair-bound for several years. He does not have any bowel or urinary incontinence.   Since last evening,   He was alert but minimally talkative. Staff had difficulty giving him his medications. no documentation of fevers, chills, agitation , patient complaining of headache, dizziness, nausea, vomiting, chest pain, palpitations, shortness of breath, abdominal pain, bowel or urinary symptoms. Patient has had good appetite and taking his medications. No witnessed seizure activity or fall. This morning patient was difficult to arouse and not talking so was sent to the ED. Patient was started on baclofen since 2/23 as a muscle relaxant for his legs as they were stiff and patient had difficulty bending his knees on the wheelchair.  Course in the ED Patient was mildly hypothermic to 96.2, remaining vitals were stable. Blood work done showed WC 5.2, normal hemoglobin and hematocrit, platelets of 160, normal chemistry including LFTs. Troponin was negative. Glucose of 102. Chest x-ray was unremarkable. EKG showed normal sinus rhythm with prolonged PR interval and left anterior fascicular block (unchanged). UA was unremarkable.   hospitalists admission requested for  observation.  Review of Systems:  As outlined in history of present illness. Unable to obtain full review of systems due to patient's encephalopathy.    Past Medical History  Diagnosis Date  . Seizures   . Cerebral palsy   . Hypertension   . Mental retardation   . Depression   . Nephrolithiasis   . BPH (benign prostatic hyperplasia)   . GI bleed 2009    coffee ground emesis, erosive esophagitis on EGD by Dr Juanda ChanceBrodie  . Esophageal stricture 2009    not dilated during the EGD, biopsy benign:  no Barretts  . Depression with anxiety    Past Surgical History  Procedure Laterality Date  . Basal cell carcinoma excision  2002    forehead  . Squamous cell carcinoma excision  2004    lip  . Bowen dz  2002    right cheek   . Esophagogastroduodenoscopy  06/14/2012    Procedure: ESOPHAGOGASTRODUODENOSCOPY (EGD);  Surgeon: Rachael Feeaniel P Jacobs, MD;  [facility];  Service: Endoscopy;  Laterality: N/A;  . Multiple extractions with alveoloplasty N/A 06/20/2014    Procedure: MULTIPLE EXTRACTIONS TEETH NUMBER TWO, TEN, TWELVE, EIGHTEEN, TWENTY-ONE, TWENTY-TWO, TWENTY-NINE, THIRTY-TWO;  Surgeon: Georgia LopesScott M Jensen, DDS;  Location: [facility] OR;  Service: Oral Surgery;  Laterality: N/A;   Social History:  reports that he has never smoked. He does not have any smokeless tobacco history on file. He reports that he does not drink alcohol or use illicit drugs.  No Known Allergies  No family history on file.  Prior to Admission medications   Medication Sig Start Date End Date Taking? Authorizing Provider  albuterol (PROVENTIL) (2.5 MG/3ML) 0.083% nebulizer solution Take 3 mLs (2.5 mg total) by nebulization every 6 (six) hours  as needed for wheezing or shortness of breath. 11/07/14  Yes Richarda Overlie, MD  amitriptyline (ELAVIL) 25 MG tablet Take 25 mg by mouth daily with breakfast.    Yes Historical Provider, MD  baclofen (LIORESAL) 10 MG tablet Take 10 mg by mouth 3 (three) times daily.   Yes Historical  Provider, MD  cloNIDine (CATAPRES) 0.2 MG tablet Take 1 tablet (0.2 mg total) by mouth 2 (two) times daily. 11/07/14  Yes Richarda Overlie, MD  cycloSPORINE (RESTASIS) 0.05 % ophthalmic emulsion Place 1 drop into both eyes 2 (two) times daily.   Yes Historical Provider, MD  dextromethorphan (DELSYM) 30 MG/5ML liquid Take 2.5 mLs (15 mg total) by mouth 2 (two) times daily. 11/07/14  Yes Richarda Overlie, MD  dutasteride (AVODART) 0.5 MG capsule Take 0.5 mg by mouth daily.   Yes Historical Provider, MD  Dutasteride-Tamsulosin HCl (JALYN) 0.5-0.4 MG CAPS Take 1 tablet by mouth daily with breakfast.    Yes Historical Provider, MD  escitalopram (LEXAPRO) 20 MG tablet Take 20 mg by mouth daily with breakfast.    Yes Historical Provider, MD  food thickener (THICK IT) POWD Take 1 g by mouth 2 (two) times daily.   Yes Historical Provider, MD  Lacosamide (VIMPAT) 150 MG TABS Take 150 mg by mouth 2 (two) times daily.   Yes Historical Provider, MD  lisinopril (PRINIVIL,ZESTRIL) 30 MG tablet Take 1 tablet (30 mg total) by mouth daily with breakfast. Patient taking differently: Take 30 mg by mouth 2 (two) times daily.  11/07/14  Yes Richarda Overlie, MD  LORazepam (ATIVAN) 1 MG tablet Take 1 tablet (1 mg total) by mouth 3 (three) times daily. 11/07/14  Yes Richarda Overlie, MD  Multiple Vitamin (DAILY VITE PO) Take 1 tablet by mouth daily with breakfast.    Yes Historical Provider, MD  ondansetron (ZOFRAN) 4 MG tablet Take 4 mg by mouth 3 (three) times daily as needed for nausea or vomiting.   Yes Historical Provider, MD  pantoprazole (PROTONIX) 40 MG tablet Take 1 tablet (40 mg total) by mouth 2 (two) times daily. 08/13/12  Yes Hart Carwin, MD  prazosin (MINIPRESS) 2 MG capsule Take 2 mg by mouth at bedtime.    Yes Historical Provider, MD  promethazine (PHENERGAN) 12.5 MG tablet Take 12.5 mg by mouth every 12 (twelve) hours as needed for nausea or vomiting.   Yes Historical Provider, MD  ranitidine (ZANTAC) 150 MG tablet Take 150 mg  by mouth 2 (two) times daily.   Yes Historical Provider, MD  tamsulosin (FLOMAX) 0.4 MG CAPS capsule Take 0.4 mg by mouth daily.   Yes Historical Provider, MD  guaiFENesin (ROBITUSSIN) 100 MG/5ML SOLN Take 10 mLs by mouth 3 (three) times daily. For 10 days 10/27/14   Historical Provider, MD  Maltodextrin-Xanthan Gum (RESOURCE THICKENUP CLEAR) POWD 2 times a day with meals Patient not taking: Reported on 12/02/2014 11/07/14   Richarda Overlie, MD  potassium chloride 20 MEQ TBCR Take 20 mEq by mouth daily. Patient not taking: Reported on 12/02/2014 11/07/14   Richarda Overlie, MD  sucralfate (CARAFATE) 1 GM/10ML suspension Take 1 g by mouth 2 (two) times daily.    Historical Provider, MD     Physical Exam:  Filed Vitals:   12/02/14 0800 12/02/14 0830 12/02/14 0915 12/02/14 0945  BP: 120/68 120/62 163/87 182/93  Pulse: 75 68 83 101  Temp:      TempSrc:      Resp: SpO2: 96% 95% 95%  97%    Constitutional: Vital signs reviewed.  elderly male lying in bed alert but nonverbal, poorly responding to commands HEENT: no pallor, no icterus, moist oral mucosa, no cervical lymphadenopathy, supple neck Cardiovascular:  S1 and S2 tachycardic, no murmurs rub or gallop Chest: CTAB, no wheezes, rales, or rhonchi GI: Soft. Non-tender, non-distended, bowel sounds are normal,  GU: no CVA tenderness  musculoskeletal: warm, no edema Neurological: AA0 x 0  Labs on Admission:  Basic Metabolic Panel:  Recent Labs Lab 12/02/14 0643  NA 138  K 3.6  CL 106  CO2 24  GLUCOSE 102*  BUN 12  CREATININE 1.04  CALCIUM 9.3   Liver Function Tests:  Recent Labs Lab 12/02/14 0643  AST 32  ALT 21  ALKPHOS 85  BILITOT 0.6  PROT 6.6  ALBUMIN 3.4*   No results for input(s): LIPASE, AMYLASE in the last 168 hours. No results for input(s): AMMONIA in the last 168 hours. CBC:  Recent Labs Lab 12/02/14 0643  WBC 5.2  NEUTROABS 2.9  HGB 13.3  HCT 38.4*  MCV 92.8  PLT 160   Cardiac  Enzymes:  Recent Labs Lab 12/02/14 0643  TROPONINI <0.03   BNP: Invalid input(s): POCBNP CBG: No results for input(s): GLUCAP in the last 168 hours.  Radiological Exams on Admission: Dg Chest Port 1 View  12/02/2014   CLINICAL DATA:  Altered mental status  EXAM: PORTABLE CHEST - 1 VIEW  COMPARISON:  November 05, 2014.  FINDINGS: There is mild atelectasis in the right base. Lungs elsewhere clear. Heart is upper normal in size with pulmonary vascularity within normal limits. No adenopathy. There is a hiatal hernia present. No bone lesions.  IMPRESSION: Atelectasis right base. Lungs otherwise clear. Hiatal hernia present.   Electronically Signed   By: Bretta Bang III M.D.   On: 12/02/2014 06:58    EKG:  sinus rhythm prolonged PR interval, left anterior block (unchanged)  Assessment/Plan  Principal Problem:   Acute encephalopathy Possibly related to initiation of baclofen which has common side effect of drowsiness and confusion. Also on multiple different medications (benzodiazepine, Vimpat and antidepressant which will have synergistic effect). -Admit under observation to med surg. -We'll check head CT. Neuro checks every 4 hours. Check TSH,  -Keep nothing by mouth until patient more alert. -No signs of infection although aspiration pneumonia is something to be concerned about.  -We'll discontinue baclofen. Resume other home medications. Keep Ativan as when necessary. -Maintain seizure precautions. If mental status unimproved over the next 24 hours will need EEG. -PT eval.   Active Problems:  Hypothermia Mild. Possibly related to medication. Monitor closely.   Seizure disorder Continue Vimpat. Will change Ativan to when necessary Maintain seizure precautions    Depression  Resume home medications  Cerebral palsy and mental retardation As for the retirement center staff patient is well oriented and very talkative at baseline.  History of GI bleed Continue PPI and  famotidine    Diet: Nothing by mouth except for sips with meds  DVT prophylaxis: sq lovenox   Code Status: full code Family Communication:None at bedside Disposition Plan: Admit to MedSurg. Return to retirement center upon discharge  Colin Bradley [facility] Pager [phone]  Total time spent on admission :50 minutes  If 7PM-7AM, please contact night-coverage [email or web address] Password [facility] 12/02/2014, 10:25 AM

## 2014-12-02 NOTE — Discharge Instructions (Signed)

## 2014-12-02 NOTE — ED Notes (Signed)
[facility] Per staff patient had a recent change in medications and started taking Baclofen, and per staff, patient has had a slow decline in mental status since taking Baclofen. Patient is disoriented x4 and does not follow commands. Responsive to loud stimuli. Incontinent of urine. 2+ pitting edema in bilateral extremities. CBG 100, HR 100, BP 195/107

## 2014-12-02 NOTE — Progress Notes (Signed)
PT Cancellation Note  Patient Details Name: Sharion BalloonJerry W Kees MRN: [id number] DOB: April 01, 1951   Cancelled Treatment:    Reason Eval/Treat Not Completed: Patient not medically ready Patient currently on bed rest. Please update activity order for PT evaluation when you feel pt is medically ready.  [place], Lorielle Boehning S 12/02/2014, 2:19 PM Sunday SpillersLogan Secor [place], [place] [phone]

## 2014-12-02 NOTE — ED Notes (Signed)
Attempted report 

## 2014-12-02 NOTE — Evaluation (Signed)
Physical Therapy Evaluation Patient Details Name: Colin Bradley MRN: [id number] DOB: Aug 22, 1951 Today's Date: 12/02/2014   History of Present Illness  [age] year old male resident of [facility] with past medical hx cerebral palsy with mental retardation, developmental delay, seizure disorder, hypertension, history of GI bleed with recent hospitalization for altered mental status secondary to healthcare associated pneumonia and dehydration sent to the ED for altered mental status.  Clinical Impression  Pt admitted with the above complications. Pt currently with functional limitations due to the deficits listed below (see PT Problem List). From previous history patient apparently very talkative and oriented at baseline. Currently awake but unresponsive to verbal commands and painful stimuli, delayed and intermittent response to visual threat. Upon sitting edge of bed with total assist, pt had notable myoclonic jerk type movements through his upper extremities on several occasions. RN notified. He presents with significant tone throughout trunk, upper and lower extremities, unable to flex knees with passive attempt. Pt will benefit from skilled PT to increase their independence and safety with mobility to allow discharge to the venue listed below.       Follow Up Recommendations SNF;Supervision/Assistance - 24 hour    Equipment Recommendations  None recommended by PT    Recommendations for Other Services       Precautions / Restrictions Precautions Precautions: Fall Restrictions Weight Bearing Restrictions: No      Mobility  Bed Mobility Overal bed mobility: +2 for physical assistance;Needs Assistance Bed Mobility: Supine to Sit;Sit to Supine     Supine to sit: Total assist;+2 for physical assistance;HOB elevated Sit to supine: Total assist;+2 for physical assistance   General bed mobility comments: Total assist. Not responding to commands. Motor response for reaching to Lt  and Rt with tilting laterally. Very rigid, LE in extension and UEs in flexion.   Transfers                    Ambulation/Gait                Stairs            Wheelchair Mobility    Modified Rankin (Stroke Patients Only)       Balance Overall balance assessment: Needs assistance;History of Falls Sitting-balance support: Bilateral upper extremity supported Sitting balance-Leahy Scale: Zero Sitting balance - Comments: Unable to maintain seated balance without assist. Very rigid throughout.                                     Pertinent Vitals/Pain Pain Assessment:  (CPOT 4/8)    Home Living Family/patient expects to be discharged to:: Skilled nursing facility   Available Help at Discharge: Skilled Nursing Facility             Additional Comments: Pulled from notes, pt not responsive.    Prior Function           Comments: Per history, patient is typically oriented and talkative with nursing home staff. Appears to be w/c bound at baseline but unable to find further information on PLOF at this time.     Hand Dominance        Extremity/Trunk Assessment   Upper Extremity Assessment: Defer to OT evaluation           Lower Extremity Assessment: Difficult to assess due to impaired cognition         Communication   Communication: Other (comment) (  Pt not responding, baseline apparently talkative)  Cognition Arousal/Alertness:  (awake but non-responsive) Behavior During Therapy: Flat affect Overall Cognitive Status: No family/caregiver present to determine baseline cognitive functioning                 General Comments: Not responding     General Comments General comments (skin integrity, edema, etc.): Pt with notable myoclonic jerks in UEs. LEs very rigid, unable to flex at knees. Not responsive to commands or painful stimuli. Minimally responsive to visual threat.    Exercises        Assessment/Plan     PT Assessment Patient needs continued PT services  PT Diagnosis Difficulty walking;Altered mental status   PT Problem List Decreased strength;Decreased range of motion;Decreased activity tolerance;Decreased mobility;Decreased balance;Decreased coordination;Decreased cognition;Decreased knowledge of use of DME;Decreased safety awareness;Decreased knowledge of precautions;Impaired tone;Pain  PT Treatment Interventions DME instruction;Gait training;Functional mobility training;Therapeutic activities;Therapeutic exercise;Balance training;Neuromuscular re-education;Cognitive remediation;Patient/family education;Wheelchair mobility training   PT Goals (Current goals can be found in the Care Plan section) Acute Rehab PT Goals Patient Stated Goal: none stated PT Goal Formulation: Patient unable to participate in goal setting Time For Goal Achievement: 12/16/14 Potential to Achieve Goals: Fair    Frequency Min 2X/week   Barriers to discharge        Co-evaluation               End of Session Equipment Utilized During Treatment: Gait belt Activity Tolerance: Other (comment) (Pt not responding to commands) Patient left: in bed;with call bell/phone within reach;with bed alarm set Nurse Communication: Mobility status;Need for lift equipment;Precautions;Other (comment) (Pt with myoclonic jerking type movements of UEs)    Functional Assessment Tool Used: clinical observation Functional Limitation: Changing and maintaining body position Changing and Maintaining Body Position Current Status (Z6109): 100 percent impaired, limited or restricted Changing and Maintaining Body Position Goal Status (U0454): At least 40 percent but less than 60 percent impaired, limited or restricted    Time: 0981-1914 PT Time Calculation (min) (ACUTE ONLY): 15 min   Charges:   PT Evaluation $Initial PT Evaluation Tier I: 1 Procedure     PT G Codes:   PT G-Codes **NOT FOR INPATIENT CLASS** Functional  Assessment Tool Used: clinical observation Functional Limitation: Changing and maintaining body position Changing and Maintaining Body Position Current Status (N8295): 100 percent impaired, limited or restricted Changing and Maintaining Body Position Goal Status (A2130): At least 40 percent but less than 60 percent impaired, limited or restricted    Berton Mount 12/02/2014, 5:06 PM Sunday [place], [place] [phone]

## 2014-12-02 NOTE — ED Provider Notes (Signed)
CSN: [id number]     Arrival date & time 12/02/14  4098 History   None    Chief Complaint  Patient presents with  . Altered Mental Status     (Consider location/radiation/quality/duration/timing/severity/associated sxs/prior Treatment) HPI Comments: Ems reports that nursing home states that pt has mental status changes over the last 3 days. Per report pt is some what able to correspond. They state that mental status since starting taking baclofen 3 days go. No definite symptoms  The history is provided by the EMS personnel.    Past Medical History  Diagnosis Date  . Seizures   . Cerebral palsy   . Hypertension   . Mental retardation   . Depression   . Nephrolithiasis   . BPH (benign prostatic hyperplasia)   . GI bleed 2009    coffee ground emesis, erosive esophagitis on EGD by Dr Juanda Chance  . Esophageal stricture 2009    not dilated during the EGD, biopsy benign:  no Barretts  . Depression with anxiety    Past Surgical History  Procedure Laterality Date  . Basal cell carcinoma excision  2002    forehead  . Squamous cell carcinoma excision  2004    lip  . Bowen dz  2002    right cheek   . Esophagogastroduodenoscopy  06/14/2012    Procedure: ESOPHAGOGASTRODUODENOSCOPY (EGD);  Surgeon: Rachael Fee, MD;  Location: [facility] ENDOSCOPY;  Service: Endoscopy;  Laterality: N/A;  . Multiple extractions with alveoloplasty N/A 06/20/2014    Procedure: MULTIPLE EXTRACTIONS TEETH NUMBER TWO, TEN, TWELVE, EIGHTEEN, TWENTY-ONE, TWENTY-TWO, TWENTY-NINE, THIRTY-TWO;  Surgeon: Georgia Lopes, DDS;  Location: [facility] OR;  Service: Oral Surgery;  Laterality: N/A;   No family history on file. History  Substance Use Topics  . Smoking status: Never Smoker   . Smokeless tobacco: Not on file  . Alcohol Use: No    Review of Systems  Unable to perform ROS: Mental status change      Allergies  Review of patient's allergies indicates no known allergies.  Home Medications   Prior to Admission  medications   Medication Sig Start Date End Date Taking? Authorizing Provider  albuterol (PROVENTIL) (2.5 MG/3ML) 0.083% nebulizer solution Take 3 mLs (2.5 mg total) by nebulization every 6 (six) hours as needed for wheezing or shortness of breath. 11/07/14   Richarda Overlie, MD  amitriptyline (ELAVIL) 25 MG tablet Take 25 mg by mouth daily with breakfast.     Historical Provider, MD  cloNIDine (CATAPRES) 0.2 MG tablet Take 1 tablet (0.2 mg total) by mouth 2 (two) times daily. 11/07/14   Richarda Overlie, MD  cycloSPORINE (RESTASIS) 0.05 % ophthalmic emulsion Place 1 drop into both eyes 2 (two) times daily.    Historical Provider, MD  dextromethorphan (DELSYM) 30 MG/5ML liquid Take 2.5 mLs (15 mg total) by mouth 2 (two) times daily. 11/07/14   Richarda Overlie, MD  Dutasteride-Tamsulosin HCl (JALYN) 0.5-0.4 MG CAPS Take 1 tablet by mouth daily with breakfast.     Historical Provider, MD  escitalopram (LEXAPRO) 20 MG tablet Take 20 mg by mouth daily with breakfast.     Historical Provider, MD  food thickener (THICK IT) POWD Take 1 g by mouth 2 (two) times daily.    Historical Provider, MD  guaiFENesin (ROBITUSSIN) 100 MG/5ML SOLN Take 10 mLs by mouth 3 (three) times daily. For 10 days 10/27/14   Historical Provider, MD  Lacosamide (VIMPAT) 150 MG TABS Take 150 mg by mouth 2 (two) times daily.  Historical Provider, MD  lisinopril (PRINIVIL,ZESTRIL) 30 MG tablet Take 1 tablet (30 mg total) by mouth daily with breakfast. 11/07/14   Richarda OverlieNayana Abrol, MD  LORazepam (ATIVAN) 1 MG tablet Take 1 tablet (1 mg total) by mouth 3 (three) times daily. 11/07/14   Richarda OverlieNayana Abrol, MD  Maltodextrin-Xanthan Gum (RESOURCE THICKENUP CLEAR) POWD 2 times a day with meals 11/07/14   Richarda OverlieNayana Abrol, MD  Multiple Vitamin (DAILY VITE PO) Take 1 tablet by mouth daily with breakfast.     Historical Provider, MD  ondansetron (ZOFRAN) 4 MG tablet Take 4 mg by mouth 3 (three) times daily as needed for nausea or vomiting.    Historical Provider, MD   pantoprazole (PROTONIX) 40 MG tablet Take 1 tablet (40 mg total) by mouth 2 (two) times daily. 08/13/12   Hart Carwinora M Brodie, MD  potassium chloride 20 MEQ TBCR Take 20 mEq by mouth daily. 11/07/14   Richarda OverlieNayana Abrol, MD  prazosin (MINIPRESS) 2 MG capsule Take 2 mg by mouth at bedtime.     Historical Provider, MD  promethazine (PHENERGAN) 12.5 MG tablet Take 12.5 mg by mouth every 12 (twelve) hours as needed for nausea or vomiting.    Historical Provider, MD  sucralfate (CARAFATE) 1 GM/10ML suspension Take 1 g by mouth 2 (two) times daily.    Historical Provider, MD   BP 170/79 mmHg  Pulse 96  Temp(Src) 96.2 F (35.7 C) (Rectal)  Resp 11  SpO2 97% Physical Exam  Constitutional:  contacted  Cardiovascular: Normal rate and regular rhythm.   Pulmonary/Chest: Effort normal and breath sounds normal.  Abdominal: Soft. Bowel sounds are normal.  Musculoskeletal:  Bilateral 2+edema  Neurological:  Awake, no verbal communication  Nursing note and vitals reviewed.   ED Course  Procedures (including critical care time) Labs Review Labs Reviewed  MRSA PCR SCREENING - Abnormal; Notable for the following:    MRSA by PCR POSITIVE (*)    All other components within normal limits  COMPREHENSIVE METABOLIC PANEL - Abnormal; Notable for the following:    Glucose, Bld 102 (*)    Albumin 3.4 (*)    GFR calc non Af Amer 74 (*)    GFR calc Af Amer 86 (*)    All other components within normal limits  CBC WITH DIFFERENTIAL/PLATELET - Abnormal; Notable for the following:    RBC 4.14 (*)    HCT 38.4 (*)    Eosinophils Relative 8 (*)    All other components within normal limits  URINALYSIS, ROUTINE W REFLEX MICROSCOPIC  TROPONIN I  TSH  BASIC METABOLIC PANEL  I-STAT CG4 LACTIC ACID, ED    Imaging Review Dg Chest Port 1 View  12/02/2014   CLINICAL DATA:  Altered mental status  EXAM: PORTABLE CHEST - 1 VIEW  COMPARISON:  November 05, 2014.  FINDINGS: There is mild atelectasis in the right base. Lungs  elsewhere clear. Heart is upper normal in size with pulmonary vascularity within normal limits. No adenopathy. There is a hiatal hernia present. No bone lesions.  IMPRESSION: Atelectasis right base. Lungs otherwise clear. Hiatal hernia present.   Electronically Signed   By: Bretta BangWilliam  Woodruff III M.D.   On: 12/02/2014 06:58     EKG Interpretation   Date/Time:  Friday December 02 2014 06:37:51 EST Ventricular Rate:  92 PR Interval:  223 QRS Duration: 147 QT Interval:  428 QTC Calculation: 529 R Axis:   -42 Text Interpretation:  Sinus rhythm Prolonged PR interval RBBB and LAFB  Confirmed by Malva CoganELOS  MD, Riley Lam ([id number]) on 12/02/2014 6:58:17 AM      MDM   Final diagnoses:  Altered mental status    Pt admitted to the hospital for change in mental status. No definite cause of infection.    Teressa Lower, NP 12/02/14 1721  Gerhard Munch, MD 12/02/14 [phone]

## 2014-12-02 NOTE — Progress Notes (Signed)
Received Report from RN [place]

## 2014-12-03 ENCOUNTER — Observation Stay (HOSPITAL_COMMUNITY): Payer: Medicare Other

## 2014-12-03 DIAGNOSIS — G809 Cerebral palsy, unspecified: Secondary | ICD-10-CM

## 2014-12-03 DIAGNOSIS — T68XXXA Hypothermia, initial encounter: Secondary | ICD-10-CM | POA: Diagnosis not present

## 2014-12-03 DIAGNOSIS — F79 Unspecified intellectual disabilities: Secondary | ICD-10-CM

## 2014-12-03 DIAGNOSIS — F329 Major depressive disorder, single episode, unspecified: Secondary | ICD-10-CM | POA: Diagnosis not present

## 2014-12-03 DIAGNOSIS — G9340 Encephalopathy, unspecified: Secondary | ICD-10-CM | POA: Diagnosis not present

## 2014-12-03 LAB — BASIC METABOLIC PANEL
Anion gap: 5 (ref 5–15)
BUN: 10 mg/dL (ref 6–23)
CO2: 22 mmol/L (ref 19–32)
CREATININE: 0.92 mg/dL (ref 0.50–1.35)
Calcium: 8.5 mg/dL (ref 8.4–10.5)
Chloride: 110 mmol/L (ref 96–112)
GFR calc Af Amer: >90 mL/min (ref >90)
GFR calc non Af Amer: 87 mL/min — ABNORMAL LOW (ref >90)
Glucose, Bld: 91 mg/dL (ref 70–99)
Potassium: 3.5 mmol/L (ref 3.5–5.1)
SODIUM: 137 mmol/L (ref 135–145)

## 2014-12-03 LAB — URINALYSIS, ROUTINE W REFLEX MICROSCOPIC
Bilirubin Urine: NEGATIVE
Glucose, UA: NEGATIVE mg/dL
Ketones, ur: NEGATIVE mg/dL
NITRITE: NEGATIVE
Protein, ur: 100 mg/dL — AB
SPECIFIC GRAVITY, URINE: 1.013 (ref 1.005–1.030)
Urobilinogen, UA: 1 mg/dL (ref 0.0–1.0)
pH: 8 (ref 5.0–8.0)

## 2014-12-03 LAB — BLOOD GAS, ARTERIAL
Acid-base deficit: 3.3 mmol/L — ABNORMAL HIGH (ref 0.0–2.0)
BICARBONATE: 20.7 meq/L (ref 20.0–24.0)
DRAWN BY: 249101
FIO2: 1 %
O2 Saturation: 98.4 %
PATIENT TEMPERATURE: 100.2
PO2 ART: 129 mmHg — AB (ref 80.0–100.0)
TCO2: 21.8 mmol/L (ref 0–100)
pCO2 arterial: 35.9 mmHg (ref 35.0–45.0)
pH, Arterial: 7.384 (ref 7.350–7.450)

## 2014-12-03 LAB — PROCALCITONIN: Procalcitonin: <0.1 ng/mL

## 2014-12-03 LAB — CBC
HEMATOCRIT: 37.4 % — AB (ref 39.0–52.0)
Hemoglobin: 12.9 g/dL — ABNORMAL LOW (ref 13.0–17.0)
MCH: 31.5 pg (ref 26.0–34.0)
MCHC: 34.5 g/dL (ref 30.0–36.0)
MCV: 91.4 fL (ref 78.0–100.0)
PLATELETS: 162 10*3/uL (ref 150–400)
RBC: 4.09 MIL/uL — AB (ref 4.22–5.81)
RDW: 13.5 % (ref 11.5–15.5)
WBC: 10.5 10*3/uL (ref 4.0–10.5)

## 2014-12-03 LAB — URINE MICROSCOPIC-ADD ON

## 2014-12-03 LAB — LACTIC ACID, PLASMA: LACTIC ACID, VENOUS: 1.1 mmol/L (ref 0.5–2.0)

## 2014-12-03 LAB — BRAIN NATRIURETIC PEPTIDE: B NATRIURETIC PEPTIDE 5: 133.7 pg/mL — AB (ref 0.0–100.0)

## 2014-12-03 LAB — GLUCOSE, CAPILLARY: Glucose-Capillary: 123 mg/dL — ABNORMAL HIGH (ref 70–99)

## 2014-12-03 LAB — D-DIMER, QUANTITATIVE (NOT AT ARMC): D DIMER QUANT: 0.4 ug{FEU}/mL (ref 0.00–0.48)

## 2014-12-03 MED ORDER — CHLORHEXIDINE GLUCONATE 0.12 % MT SOLN
15.0000 mL | Freq: Two times a day (BID) | OROMUCOSAL | Status: DC
  Administered 2014-12-03 – 2014-12-07 (×8): 15 mL via OROMUCOSAL
  Filled 2014-12-03 (×11): qty 15

## 2014-12-03 MED ORDER — METOPROLOL TARTRATE 1 MG/ML IV SOLN
5.0000 mg | Freq: Once | INTRAVENOUS | Status: AC
  Administered 2014-12-03: 5 mg via INTRAVENOUS
  Filled 2014-12-03: qty 5

## 2014-12-03 MED ORDER — LACOSAMIDE 200 MG/20ML IV SOLN
150.0000 mg | Freq: Two times a day (BID) | INTRAVENOUS | Status: DC
  Administered 2014-12-03 – 2014-12-06 (×7): 150 mg via INTRAVENOUS
  Filled 2014-12-03 (×14): qty 15

## 2014-12-03 MED ORDER — LORAZEPAM 2 MG/ML IJ SOLN: 0.5000 mg | Freq: Four times a day (QID) | INTRAMUSCULAR | Status: DC | PRN

## 2014-12-03 MED ORDER — CETYLPYRIDINIUM CHLORIDE 0.05 % MT LIQD
7.0000 mL | Freq: Two times a day (BID) | OROMUCOSAL | Status: DC
  Administered 2014-12-03 – 2014-12-07 (×9): 7 mL via OROMUCOSAL

## 2014-12-03 MED ORDER — PIPERACILLIN-TAZOBACTAM 3.375 G IVPB
3.3750 g | Freq: Three times a day (TID) | INTRAVENOUS | Status: DC
  Administered 2014-12-03 – 2014-12-06 (×11): 3.375 g via INTRAVENOUS
  Filled 2014-12-03 (×13): qty 50

## 2014-12-03 MED ORDER — PANTOPRAZOLE SODIUM 40 MG IV SOLR
40.0000 mg | Freq: Two times a day (BID) | INTRAVENOUS | Status: DC
  Administered 2014-12-03 – 2014-12-06 (×7): 40 mg via INTRAVENOUS
  Filled 2014-12-03 (×9): qty 40

## 2014-12-03 MED ORDER — LEVALBUTEROL HCL 0.63 MG/3ML IN NEBU: 0.6300 mg | INHALATION_SOLUTION | Freq: Four times a day (QID) | RESPIRATORY_TRACT | Status: DC | PRN

## 2014-12-03 MED ORDER — LEVALBUTEROL HCL 0.63 MG/3ML IN NEBU
0.6300 mg | INHALATION_SOLUTION | Freq: Four times a day (QID) | RESPIRATORY_TRACT | Status: DC
  Administered 2014-12-03 (×2): 0.63 mg via RESPIRATORY_TRACT
  Filled 2014-12-03 (×2): qty 3

## 2014-12-03 MED ORDER — LEVALBUTEROL HCL 0.63 MG/3ML IN NEBU
0.6300 mg | INHALATION_SOLUTION | Freq: Three times a day (TID) | RESPIRATORY_TRACT | Status: DC
  Administered 2014-12-04 – 2014-12-07 (×11): 0.63 mg via RESPIRATORY_TRACT
  Filled 2014-12-03 (×21): qty 3

## 2014-12-03 MED ORDER — PROMETHAZINE HCL 25 MG/ML IJ SOLN: 12.5000 mg | Freq: Once | INTRAMUSCULAR | Status: DC

## 2014-12-03 MED ORDER — METOPROLOL TARTRATE 1 MG/ML IV SOLN
2.5000 mg | Freq: Four times a day (QID) | INTRAVENOUS | Status: DC
  Administered 2014-12-03 – 2014-12-05 (×7): 2.5 mg via INTRAVENOUS
  Filled 2014-12-03 (×11): qty 5

## 2014-12-03 MED ORDER — SODIUM CHLORIDE 0.9 % IV SOLN
500.0000 mg | Freq: Two times a day (BID) | INTRAVENOUS | Status: DC
  Administered 2014-12-03 – 2014-12-04 (×2): 500 mg via INTRAVENOUS
  Filled 2014-12-03 (×6): qty 500

## 2014-12-03 MED ORDER — HYDRALAZINE HCL 20 MG/ML IJ SOLN
10.0000 mg | Freq: Four times a day (QID) | INTRAMUSCULAR | Status: DC | PRN
  Administered 2014-12-03 – 2014-12-04 (×4): 10 mg via INTRAVENOUS
  Filled 2014-12-03 (×4): qty 1

## 2014-12-03 NOTE — Progress Notes (Signed)
UR completed 

## 2014-12-03 NOTE — Progress Notes (Signed)
ANTIBIOTIC CONSULT NOTE - INITIAL  Pharmacy Consult:  Vancomycin / Zosyn Indication:  PNA  No Known Allergies  Patient Measurements: Weight: 115 lb 1.3 oz (52.2 kg)  Vital Signs: Temp: 99 F (37.2 C) (02/27 1554) Temp Source: Axillary (02/27 1554) BP: 195/93 mmHg (02/27 1554) Pulse Rate: 135 (02/27 1554) Intake/Output from previous day: 02/26 0701 - 02/27 0700 In: 466.3 [P.O.:100; I.V.:366.3] Out: 500 [Urine:500]   Labs:  Recent Labs  12/02/14 0643 12/03/14 0516  WBC 5.2  --   HGB 13.3  --   PLT 160  --   CREATININE 1.04 0.92   Estimated Creatinine Clearance: 59.9 mL/min (by C-G formula based on Cr of 0.92). No results for input(s): VANCOTROUGH, VANCOPEAK, VANCORANDOM, GENTTROUGH, GENTPEAK, GENTRANDOM, TOBRATROUGH, TOBRAPEAK, TOBRARND, AMIKACINPEAK, AMIKACINTROU, AMIKACIN in the last 72 hours.   Microbiology: Recent Results (from the past 720 hour(s))  Culture, blood (routine x 2)     Status: None   Collection Time: 11/05/14  3:26 PM  Result Value Ref Range Status   Specimen Description BLOOD RIGHT WRIST  Final   Special Requests BOTTLES DRAWN AEROBIC AND ANAEROBIC 10 CC EACH  Final   Culture   Final    NO GROWTH 5 DAYS Performed at [facility]    Report Status 11/11/2014 FINAL  Final  Culture, blood (routine x 2)     Status: None   Collection Time: 11/05/14  3:26 PM  Result Value Ref Range Status   Specimen Description BLOOD LEFT HAND  Final   Special Requests BOTTLES DRAWN AEROBIC AND ANAEROBIC 5 CC EACH  Final   Culture   Final    NO GROWTH 5 DAYS Performed at [facility]    Report Status 11/11/2014 FINAL  Final  Urine culture     Status: None   Collection Time: 11/05/14  4:18 PM  Result Value Ref Range Status   Specimen Description URINE, CATHETERIZED  Final   Special Requests NONE  Final   Colony Count   Final    1,000 COLONIES/ML Performed at [facility]    Culture   Final    STAPHYLOCOCCUS SPECIES (COAGULASE  NEGATIVE) Note: RIFAMPIN AND GENTAMICIN SHOULD NOT BE USED AS SINGLE DRUGS FOR TREATMENT OF STAPH INFECTIONS. Performed at [facility]    Report Status 11/08/2014 FINAL  Final   Organism ID, Bacteria STAPHYLOCOCCUS SPECIES (COAGULASE NEGATIVE)  Final      Susceptibility   Staphylococcus species (coagulase negative) - MIC*    GENTAMICIN <=0.5 SENSITIVE Sensitive     LEVOFLOXACIN 0.5 SENSITIVE Sensitive     NITROFURANTOIN <=16 SENSITIVE Sensitive     OXACILLIN <=0.25 SENSITIVE Sensitive     PENICILLIN >=0.5 RESISTANT Resistant     RIFAMPIN <=0.5 SENSITIVE Sensitive     TRIMETH/SULFA <=10 SENSITIVE Sensitive     VANCOMYCIN <=0.5 SENSITIVE Sensitive     TETRACYCLINE <=1 SENSITIVE Sensitive     * STAPHYLOCOCCUS SPECIES (COAGULASE NEGATIVE)  MRSA PCR Screening     Status: Abnormal   Collection Time: 12/02/14  2:28 PM  Result Value Ref Range Status   MRSA by PCR POSITIVE (A) NEGATIVE Final    Comment:        The GeneXpert MRSA Assay (FDA approved for NASAL specimens only), is one component of a comprehensive MRSA colonization surveillance program. It is not intended to diagnose MRSA infection nor to guide or monitor treatment for MRSA infections. RESULT CALLED TO, READ BACK BY AND VERIFIED WITH: Sharol Roussel RN 17:10  12/02/14 ([email or web address])     Medical History: Past Medical History  Diagnosis Date  . Seizures   . Cerebral palsy   . Hypertension   . Mental retardation   . Depression   . Nephrolithiasis   . BPH (benign prostatic hyperplasia)   . GI bleed 2009    coffee ground emesis, erosive esophagitis on EGD by Dr Juanda ChanceBrodie  . Esophageal stricture 2009    not dilated during the EGD, biopsy benign:  no Barretts  . Depression with anxiety       Assessment: [age] YOM presented with history of AMS for one day.  Pharmacy consulted to manage vancomycin and Zosyn for PNA, concerning for aspiration.  CXR showed atelectasis, otherwise lungs are clear.  Baseline labs  reviewed.  Vanc 2/27 >> Zosyn 2/27 >>  2/26 MRSA PCR - positive 2/27 BCx x2 - 2/27 UCx -   Goal of Therapy:  Vancomycin trough level 15-20 mcg/ml   Plan:  - Vanc 500mg  IV Q12H - Zosyn 3.375gm IV Q8H, 4 hr infusion - Monitor renal fxn, clinical progress, vanc trough at Css - F/U with de-escalating abx, BP - If using prazosin for BPH, consider discontinuing therapy as patient is already on Flomax    Joanna Hall D. Laney Potashang, PharmD, BCPS Pager:  [phone] 12/03/2014, 4:32 PM

## 2014-12-03 NOTE — Progress Notes (Signed)
Patient ID: Colin Bradley  male  YNW:[id number]    DOB: 17-Oct-1950    DOA: 12/02/2014  PCP: Julian HySOUTH,STEPHEN ALAN, MD   Brief history of present illness 64 year old male resident of [facility] with past medical hx cerebral palsy with mental retardation, developmental delay, seizure disorder, hypertension, history of GI bleed with recent hospitalization for altered mental status secondary to healthcare associated pneumonia and dehydration sent to the ED this morning for altered mental status since last evening. At baseline patient is well oriented and very talkative. He has been wheelchair-bound for several years. He does not have any bowel or urinary incontinence.  Over the last 3 days he was having mental status changes prior to admission, he was noticed to be alert but minimally talkative.Nursing home staff had difficulty giving him his medications. On the morning of admission, he was difficult to arouse and not talking so was sent to the ED. Patient was started on baclofen since 2/23 as a muscle relaxant for his legs as they were stiff and patient had difficulty bending his knees on the wheelchair.  Assessment/Plan: Principal Problem:   Acute encephalopathy: Currently alert and awake, likely due to initiation of baclofen, also on multiple different medications including Vimpat, antidepressant and BZD - Head CT showed chronic atrophic and ischemic changes noted acute abnormality - TSH 2.1 - UA negative for UTI baclofen discontinued - Continue seizure precautions, neuro checks - PT evaluation recommended skilled nursing facility, had notable myoclonic jerk type of months during the PT evaluation, will order EEG  Active Problems:   Depression, cerebral palsy and mental retardation - Per the nursing staff at the facility patient is well oriented and talkative  Hypothermia - Resolved likely due to medication  Seizure disorder Continue Vimpat, Ativan as needed Continue seizure  precautions  History of GI bleed Continue PPI  Patient is alert and awake, swallow evaluation, start on diet. Previous admission he was on regular diet   DVT Prophylaxis:Lovenox  Code Status:Full code  Family Communication:  Disposition:  Consultants: none Procedures: CT head  Antibiotics:  None  Subjective: Patient seen and examined, alert and awake, responds yes and no to the questions, not very interactive   Objective: Weight change:   Intake/Output Summary (Last 24 hours) at 12/03/14 1102 Last data filed at 12/03/14 0535  Gross per 24 hour  Intake 466.25 ml  Output    500 ml  Net -33.75 ml   Blood pressure 157/80, pulse 100, temperature 97.9 F (36.6 C), temperature source Oral, resp. rate 16, SpO2 95 %.  Physical Exam: General: Alert and awake, NAD, responds yes and no to questions  CVS: S1-S2 clear, no murmur rubs or gallops Chest: clear to auscultation bilaterally, no wheezing, rales or rhonchi Abdomen: soft nontender, nondistended, normal bowel sounds  Extremities: no cyanosis, clubbing or edema noted bilaterally, + Contractures, lower extremity   Lab Results: Basic Metabolic Panel:  Recent Labs Lab 12/02/14 0643 12/03/14 0516  NA 138 137  K 3.6 3.5  CL 106 110  CO2 24 22  GLUCOSE 102* 91  BUN 12 10  CREATININE 1.04 0.92  CALCIUM 9.3 8.5   Liver Function Tests:  Recent Labs Lab 12/02/14 0643  AST 32  ALT 21  ALKPHOS 85  BILITOT 0.6  PROT 6.6  ALBUMIN 3.4*   No results for input(s): LIPASE, AMYLASE in the last 168 hours. No results for input(s): AMMONIA in the last 168 hours. CBC:  Recent Labs Lab 12/02/14 0643  WBC  5.2  NEUTROABS 2.9  HGB 13.3  HCT 38.4*  MCV 92.8  PLT 160   Cardiac Enzymes:  Recent Labs Lab 12/02/14 0643  TROPONINI <0.03   BNP: Invalid input(s): POCBNP CBG: No results for input(s): GLUCAP in the last 168 hours.   Micro Results: Recent Results (from the past 240 hour(s))  MRSA PCR  Screening     Status: Abnormal   Collection Time: 12/02/14  2:28 PM  Result Value Ref Range Status   MRSA by PCR POSITIVE (A) NEGATIVE Final    Comment:        The GeneXpert MRSA Assay (FDA approved for NASAL specimens only), is one component of a comprehensive MRSA colonization surveillance program. It is not intended to diagnose MRSA infection nor to guide or monitor treatment for MRSA infections. RESULT CALLED TO, READ BACK BY AND VERIFIED WITH: Sharol Roussel RN 17:10 12/02/14 ([email or web address])     Studies/Results: Ct Head Wo Contrast  12/02/2014   CLINICAL DATA:  Confusion, history of seizures and cerebral palsy  EXAM: CT HEAD WITHOUT CONTRAST  TECHNIQUE: Contiguous axial images were obtained from the base of the skull through the vertex without intravenous contrast.  COMPARISON:  07/05/2013  FINDINGS: The bony calvarium is intact. Diffuse basal ganglia calcifications are again identified. Mild atrophic changes and chronic white matter ischemic change is seen. No findings to suggest acute hemorrhage, acute infarction or space-occupying mass lesion are noted. Calcification in the pons and left frontal lobe are again seen and stable. No acute abnormality is noted.  IMPRESSION: Chronic atrophic and ischemic changes.  No acute abnormality noted.   Electronically Signed   By: Alcide Clever M.D.   On: 12/02/2014 11:38   Dg Chest Port 1 View  12/02/2014   CLINICAL DATA:  Altered mental status  EXAM: PORTABLE CHEST - 1 VIEW  COMPARISON:  November 05, 2014.  FINDINGS: There is mild atelectasis in the right base. Lungs elsewhere clear. Heart is upper normal in size with pulmonary vascularity within normal limits. No adenopathy. There is a hiatal hernia present. No bone lesions.  IMPRESSION: Atelectasis right base. Lungs otherwise clear. Hiatal hernia present.   Electronically Signed   By: Bretta Bang III M.D.   On: 12/02/2014 06:58   Dg Chest Port 1 View  11/05/2014   CLINICAL DATA:  Fever.  History of  seizure.  EXAM: PORTABLE CHEST - 1 VIEW  COMPARISON:  PA and lateral chest 06/17/2014.  FINDINGS: Airspace disease is seen the lung bases bilaterally and in the right mid lung zone. Mild elevation of the right hemidiaphragm relative to the left is unchanged. Heart size is upper normal.  IMPRESSION: Right worse than left airspace disease most worrisome for pneumonia.   Electronically Signed   By: Drusilla Kanner M.D.   On: 11/05/2014 15:55    Medications: Scheduled Meds: . antiseptic oral rinse  7 mL Mouth Rinse q12n4p  . chlorhexidine  15 mL Mouth Rinse BID  . Chlorhexidine Gluconate Cloth  6 each Topical Q0600  . cloNIDine  0.2 mg Oral BID  . cycloSPORINE  1 drop Both Eyes BID  . dutasteride  0.5 mg Oral QPC supper  . enoxaparin (LOVENOX) injection  40 mg Subcutaneous Q24H  . escitalopram  20 mg Oral Q breakfast  . famotidine  20 mg Oral BID  . lacosamide  150 mg Oral BID  . lisinopril  30 mg Oral Q breakfast  . multivitamin with minerals   Oral Q breakfast  . mupirocin  ointment  1 application Nasal BID  . pantoprazole  40 mg Oral BID  . prazosin  2 mg Oral QHS  . tamsulosin  0.4 mg Oral QPC supper   Time spent 25 minutes     RAI,RIPUDEEP M.D. [facility] 12/03/2014, 11:02 AM Pager: [phone]  If 7PM-7AM, please contact night-coverage [email or web address] Password [email or web address]

## 2014-12-03 NOTE — Progress Notes (Signed)
Attempted report to 2H. Nurse to call back.

## 2014-12-03 NOTE — Progress Notes (Signed)
Patient to be transferred to 2H28. Notified legal guardian of his transfer, and left message for his father to give me a call back to update him on patient's transfer.

## 2014-12-03 NOTE — Progress Notes (Addendum)
Was in patients room with NT and patient at that time was alert, but diaphoretic and started having dry heaves and did have a small amount of green emesis. Gave 4mg  IV zofran. Quickly started looking worse and less alert. Checked vitals and found patient's BP to be 195/93, HR 135, RR 32, Temp 99.5 axillary, SPO2 of 79% on RA. Patient placed on nonrebreather and rapid response and MD paged. SPO2 rose up to 92% by the time rapid arrived. Also noted at this time to have blood in urine that is new as well as new blood when doing oral suctioning. Orders placed per MD. Will be transferring to stepdown bed when one available. Will continue to monitor patient.

## 2014-12-03 NOTE — Significant Event (Signed)
Rapid Response Event Note  Overview: Time Called: 1556 Arrival Time: 1600 Event Type: Respiratory  Initial Focused Assessment:  Called by RN to assess patient with sudden respiratory distress.  Upon my arrival to patients room, RN and RT at bedside.  Patient is on NRB sats 92%, skin is hot and dry.  HR 132, 195/93, RR 33.  Patient with labored breathing   Interventions: rectal temp 100.2, tylenol given, Dr. Isidoro Donningai at bedside.  ABG ordered, breathing treatment ordered, labs ordered, PCXR.  Patient to transfer to SDU.     Event Summary:   at      at          [facility] Center[facility]fe, Gevork Ayyad Ann

## 2014-12-03 NOTE — Progress Notes (Signed)
Called by RN, patient in sudden respiratory distress  Patient seen and examined, alert and awake however coarse breath sounds throughout. Possibly may have aspirated.  BP 195/93 mmHg  Pulse 135  Temp(Src) 99 F (37.2 C) (Axillary)  Resp 32  Wt 52.2 kg (115 lb 1.3 oz)  SpO2 79%  SIRS, acute respiratory failure - Ordered stat chest x-ray, breathing treatment, ABG - CBC, pro-calcitonin, blood cultures, lactic acid, BNP, d-dimer - NPO, swallow evaluation by speech therapy - Placed on vanc (MRSA swab +)  and zosyn for the concern of aspiration pneumonia - Transfer to stepdown unit   RAI,RIPUDEEP M.D. [facility] 12/03/2014, 4:31 PM  Pager: [phone]

## 2014-12-04 ENCOUNTER — Observation Stay (HOSPITAL_COMMUNITY): Payer: Medicare Other

## 2014-12-04 DIAGNOSIS — E876 Hypokalemia: Secondary | ICD-10-CM | POA: Diagnosis not present

## 2014-12-04 DIAGNOSIS — R112 Nausea with vomiting, unspecified: Secondary | ICD-10-CM | POA: Diagnosis not present

## 2014-12-04 DIAGNOSIS — I10 Essential (primary) hypertension: Secondary | ICD-10-CM | POA: Diagnosis present

## 2014-12-04 DIAGNOSIS — G92 Toxic encephalopathy: Secondary | ICD-10-CM | POA: Diagnosis present

## 2014-12-04 DIAGNOSIS — N390 Urinary tract infection, site not specified: Secondary | ICD-10-CM | POA: Diagnosis present

## 2014-12-04 DIAGNOSIS — Z993 Dependence on wheelchair: Secondary | ICD-10-CM | POA: Diagnosis not present

## 2014-12-04 DIAGNOSIS — G809 Cerebral palsy, unspecified: Secondary | ICD-10-CM | POA: Diagnosis present

## 2014-12-04 DIAGNOSIS — F329 Major depressive disorder, single episode, unspecified: Secondary | ICD-10-CM | POA: Diagnosis present

## 2014-12-04 DIAGNOSIS — J9600 Acute respiratory failure, unspecified whether with hypoxia or hypercapnia: Secondary | ICD-10-CM | POA: Diagnosis not present

## 2014-12-04 DIAGNOSIS — Z79899 Other long term (current) drug therapy: Secondary | ICD-10-CM | POA: Diagnosis not present

## 2014-12-04 DIAGNOSIS — R636 Underweight: Secondary | ICD-10-CM | POA: Diagnosis present

## 2014-12-04 DIAGNOSIS — A419 Sepsis, unspecified organism: Secondary | ICD-10-CM | POA: Diagnosis present

## 2014-12-04 DIAGNOSIS — R4182 Altered mental status, unspecified: Secondary | ICD-10-CM | POA: Diagnosis present

## 2014-12-04 DIAGNOSIS — B9620 Unspecified Escherichia coli [E. coli] as the cause of diseases classified elsewhere: Secondary | ICD-10-CM | POA: Diagnosis present

## 2014-12-04 DIAGNOSIS — Z87438 Personal history of other diseases of male genital organs: Secondary | ICD-10-CM | POA: Diagnosis not present

## 2014-12-04 DIAGNOSIS — R0602 Shortness of breath: Secondary | ICD-10-CM | POA: Diagnosis present

## 2014-12-04 DIAGNOSIS — Z87442 Personal history of urinary calculi: Secondary | ICD-10-CM | POA: Diagnosis not present

## 2014-12-04 DIAGNOSIS — J690 Pneumonitis due to inhalation of food and vomit: Secondary | ICD-10-CM | POA: Diagnosis present

## 2014-12-04 DIAGNOSIS — F419 Anxiety disorder, unspecified: Secondary | ICD-10-CM | POA: Diagnosis not present

## 2014-12-04 DIAGNOSIS — J159 Unspecified bacterial pneumonia: Secondary | ICD-10-CM | POA: Diagnosis not present

## 2014-12-04 DIAGNOSIS — Z681 Body mass index (BMI) 19 or less, adult: Secondary | ICD-10-CM | POA: Diagnosis not present

## 2014-12-04 DIAGNOSIS — Z9981 Dependence on supplemental oxygen: Secondary | ICD-10-CM | POA: Diagnosis not present

## 2014-12-04 DIAGNOSIS — N400 Enlarged prostate without lower urinary tract symptoms: Secondary | ICD-10-CM | POA: Diagnosis present

## 2014-12-04 DIAGNOSIS — Z8719 Personal history of other diseases of the digestive system: Secondary | ICD-10-CM | POA: Diagnosis not present

## 2014-12-04 DIAGNOSIS — G9340 Encephalopathy, unspecified: Secondary | ICD-10-CM | POA: Diagnosis not present

## 2014-12-04 DIAGNOSIS — T428X5A Adverse effect of antiparkinsonism drugs and other central muscle-tone depressants, initial encounter: Secondary | ICD-10-CM | POA: Diagnosis present

## 2014-12-04 DIAGNOSIS — G40909 Epilepsy, unspecified, not intractable, without status epilepticus: Secondary | ICD-10-CM | POA: Diagnosis present

## 2014-12-04 DIAGNOSIS — T68XXXA Hypothermia, initial encounter: Secondary | ICD-10-CM | POA: Diagnosis present

## 2014-12-04 DIAGNOSIS — Z8669 Personal history of other diseases of the nervous system and sense organs: Secondary | ICD-10-CM | POA: Diagnosis not present

## 2014-12-04 DIAGNOSIS — Z85828 Personal history of other malignant neoplasm of skin: Secondary | ICD-10-CM | POA: Diagnosis not present

## 2014-12-04 DIAGNOSIS — F79 Unspecified intellectual disabilities: Secondary | ICD-10-CM | POA: Diagnosis present

## 2014-12-04 LAB — COMPREHENSIVE METABOLIC PANEL
ALBUMIN: 2.9 g/dL — AB (ref 3.5–5.2)
ALT: 19 U/L (ref 0–53)
AST: 28 U/L (ref 0–37)
Alkaline Phosphatase: 72 U/L (ref 39–117)
Anion gap: 12 (ref 5–15)
BUN: 11 mg/dL (ref 6–23)
CO2: 22 mmol/L (ref 19–32)
CREATININE: 1.24 mg/dL (ref 0.50–1.35)
Calcium: 9.1 mg/dL (ref 8.4–10.5)
Chloride: 106 mmol/L (ref 96–112)
GFR, EST AFRICAN AMERICAN: 69 mL/min — AB (ref >90)
GFR, EST NON AFRICAN AMERICAN: 60 mL/min — AB (ref >90)
Glucose, Bld: 91 mg/dL (ref 70–99)
Potassium: 3.7 mmol/L (ref 3.5–5.1)
Sodium: 140 mmol/L (ref 135–145)
Total Bilirubin: 1.2 mg/dL (ref 0.3–1.2)
Total Protein: 6.3 g/dL (ref 6.0–8.3)

## 2014-12-04 LAB — LIPASE, BLOOD: Lipase: 23 U/L (ref 11–59)

## 2014-12-04 LAB — CBC
HCT: 39.3 % (ref 39.0–52.0)
HEMOGLOBIN: 13.4 g/dL (ref 13.0–17.0)
MCH: 31.5 pg (ref 26.0–34.0)
MCHC: 34.1 g/dL (ref 30.0–36.0)
MCV: 92.5 fL (ref 78.0–100.0)
Platelets: 171 10*3/uL (ref 150–400)
RBC: 4.25 MIL/uL (ref 4.22–5.81)
RDW: 13.8 % (ref 11.5–15.5)
WBC: 27.7 10*3/uL — AB (ref 4.0–10.5)

## 2014-12-04 LAB — AMYLASE: Amylase: 275 U/L — ABNORMAL HIGH (ref 0–105)

## 2014-12-04 MED ORDER — PROMETHAZINE HCL 25 MG/ML IJ SOLN
12.5000 mg | Freq: Four times a day (QID) | INTRAMUSCULAR | Status: DC | PRN
  Administered 2014-12-04: 12.5 mg via INTRAVENOUS
  Filled 2014-12-04: qty 1

## 2014-12-04 MED ORDER — VANCOMYCIN HCL IN DEXTROSE 750-5 MG/150ML-% IV SOLN
750.0000 mg | INTRAVENOUS | Status: DC
  Administered 2014-12-05 – 2014-12-06 (×2): 750 mg via INTRAVENOUS
  Filled 2014-12-04 (×2): qty 150

## 2014-12-04 MED ORDER — PROMETHAZINE HCL 25 MG/ML IJ SOLN: 12.5000 mg | INTRAMUSCULAR | Status: DC | PRN

## 2014-12-04 MED ORDER — DEXTROSE-NACL 5-0.45 % IV SOLN
INTRAVENOUS | Status: DC
  Administered 2014-12-04 (×2): 75 mL via INTRAVENOUS
  Administered 2014-12-07: 06:00:00 via INTRAVENOUS

## 2014-12-04 NOTE — Progress Notes (Signed)
Notified md about pt having bile emesis.  bp 171/97.  New orders received. Will continue to monitor. Karena Addisonoro, Thelma Lorenzetti T

## 2014-12-04 NOTE — Progress Notes (Signed)
Patient ID: Colin Bradley  male  UEA:[id number]    DOB: Aug 03, 1951    DOA: 12/02/2014  PCP: Julian HySOUTH,STEPHEN ALAN, MD   Brief history of present illness 64 year old male resident of [facility] with past medical hx cerebral palsy with mental retardation, developmental delay, seizure disorder, hypertension, history of GI bleed with recent hospitalization for altered mental status secondary to healthcare associated pneumonia and dehydration sent to the ED this morning for altered mental status since last evening. At baseline patient is well oriented and very talkative. He has been wheelchair-bound for several years. He does not have any bowel or urinary incontinence.  Over the last 3 days he was having mental status changes prior to admission, he was noticed to be alert but minimally talkative.Nursing home staff had difficulty giving him his medications. On the morning of admission, he was difficult to arouse and not talking so was sent to the ED. Patient was started on baclofen since 2/23 as a muscle relaxant for his legs as they were stiff and patient had difficulty bending his knees on the wheelchair.  Assessment/Plan: Principal Problem:   Acute encephalopathy: Currently alert and awake, likely due to initiation of baclofen, also on multiple different medications including Vimpat, antidepressant and BZD and UTI - Head CT showed chronic atrophic and ischemic changes noted acute abnormality - TSH 2.1 -  Continue seizure precautions, neuro checks, baclofen discontinued - PT evaluation recommended snf, had notable myoclonic jerk type of months during the PT evaluation, follow EEG  Active Problems: Nausea, vomiting: 1 episode last night, Improving - No further nausea and vomiting, abdominal x-ray reviewed, no small bowel obstruction or ileus  Aspiration pneumonia - Chest x-ray showed infiltration or atelectasis in the right lung base with a right pleural effusion may indicate  pneumonia - Continue IV Zosyn. If patient has any further nausea or vomiting issues, may need to place NG tube - Swallow evaluation recommended NPO, if no improvement by tomorrow, will place temporary nasal feeding tube   Urinary tract infection - Continue IV Zosyn, follow urine culture and sensitivities  Hypertension - Currently NPO, placed on scheduled IV Lopressor    Depression, cerebral palsy and mental retardation - Per the nursing staff at the facility patient is well oriented and talkative  Hypothermia - Resolved likely due to medication  Seizure disorder Continue IV Vimpat, Ativan as needed Continue seizure precautions  History of GI bleed Continue PPI   DVT Prophylaxis:Lovenox  Code Status:Full code  Family Communication: left detailed message for Ms Charlsie QuestBrand, legal guardian for update     Disposition:  Consultants: none Procedures: CT head  Antibiotics:  None  Subjective: Patient seen and examined, alert and awake, Overnight issues noted  Objective: Weight change:   Intake/Output Summary (Last 24 hours) at 12/04/14 1120 Last data filed at 12/04/14 0512  Gross per 24 hour  Intake    250 ml  Output    951 ml  Net   -701 ml   Blood pressure 136/70, pulse 103, temperature 97.4 F (36.3 C), temperature source Axillary, resp. rate 19, weight 52.2 kg (115 lb 1.3 oz), SpO2 98 %.  Physical Exam: General: Alert and awake, NAD, tracking with his eyes but no significant responding to the questions CVS: S1-S2 clear Chest:Crackles at the right lung base,  Abdomen: soft nontender, nondistended, normal bowel sounds  Extremities: no cyanosis, clubbing or edema noted bilaterally   Lab Results: Basic Metabolic Panel:  Recent Labs Lab 12/03/14 0516 12/04/14 0325  NA 137 140  K 3.5 3.7  CL 110 106  CO2 22 22  GLUCOSE 91 91  BUN 10 11  CREATININE 0.92 1.24  CALCIUM 8.5 9.1   Liver Function Tests:  Recent Labs Lab 12/02/14 0643 12/04/14 0325  AST  32 28  ALT 21 19  ALKPHOS 85 72  BILITOT 0.6 1.2  PROT 6.6 6.3  ALBUMIN 3.4* 2.9*    Recent Labs Lab 12/04/14 0325  LIPASE 23  AMYLASE 275*   No results for input(s): AMMONIA in the last 168 hours. CBC:  Recent Labs Lab 12/02/14 0643 12/03/14 1650 12/04/14 0325  WBC 5.2 10.5 27.7*  NEUTROABS 2.9  --   --   HGB 13.3 12.9* 13.4  HCT 38.4* 37.4* 39.3  MCV 92.8 91.4 92.5  PLT 160 162 171   Cardiac Enzymes:  Recent Labs Lab 12/02/14 0643  TROPONINI <0.03   BNP: Invalid input(s): POCBNP CBG:  Recent Labs Lab 12/03/14 1601  GLUCAP 123*     Micro Results: Recent Results (from the past 240 hour(s))  MRSA PCR Screening     Status: Abnormal   Collection Time: 12/02/14  2:28 PM  Result Value Ref Range Status   MRSA by PCR POSITIVE (A) NEGATIVE Final    Comment:        The GeneXpert MRSA Assay (FDA approved for NASAL specimens only), is one component of a comprehensive MRSA colonization surveillance program. It is not intended to diagnose MRSA infection nor to guide or monitor treatment for MRSA infections. RESULT CALLED TO, READ BACK BY AND VERIFIED WITH: Sharol Roussel RN 17:10 12/02/14 ([email or web address])     Studies/Results: Ct Head Wo Contrast  12/02/2014   CLINICAL DATA:  Confusion, history of seizures and cerebral palsy  EXAM: CT HEAD WITHOUT CONTRAST  TECHNIQUE: Contiguous axial images were obtained from the base of the skull through the vertex without intravenous contrast.  COMPARISON:  07/05/2013  FINDINGS: The bony calvarium is intact. Diffuse basal ganglia calcifications are again identified. Mild atrophic changes and chronic white matter ischemic change is seen. No findings to suggest acute hemorrhage, acute infarction or space-occupying mass lesion are noted. Calcification in the pons and left frontal lobe are again seen and stable. No acute abnormality is noted.  IMPRESSION: Chronic atrophic and ischemic changes.  No acute abnormality noted.   Electronically  Signed   By: Alcide Clever M.D.   On: 12/02/2014 11:38   Dg Chest Port 1 View  12/03/2014   CLINICAL DATA:  Shortness of breath.  EXAM: PORTABLE CHEST - 1 VIEW  COMPARISON:  12/02/2014  FINDINGS: Shallow inspiration. Borderline heart size with normal pulmonary vascularity. There is a small right pleural effusion with basilar infiltration or atelectasis. Appearance may indicate pneumonia. Left lung appears clear and expanded. Volume loss in the right lung. Degenerative changes in the spine and shoulders. No pneumothorax.  IMPRESSION: Infiltration or atelectasis in the right lung base with right pleural effusion may indicate pneumonia.   Electronically Signed   By: Burman Nieves M.D.   On: 12/03/2014 17:36   Dg Chest Port 1 View  12/02/2014   CLINICAL DATA:  Altered mental status  EXAM: PORTABLE CHEST - 1 VIEW  COMPARISON:  November 05, 2014.  FINDINGS: There is mild atelectasis in the right base. Lungs elsewhere clear. Heart is upper normal in size with pulmonary vascularity within normal limits. No adenopathy. There is a hiatal hernia present. No bone lesions.  IMPRESSION: Atelectasis right base. Lungs otherwise clear. Hiatal  hernia present.   Electronically Signed   By: Bretta Bang III M.D.   On: 12/02/2014 06:58   Dg Chest Port 1 View  11/05/2014   CLINICAL DATA:  Fever.  History of seizure.  EXAM: PORTABLE CHEST - 1 VIEW  COMPARISON:  PA and lateral chest 06/17/2014.  FINDINGS: Airspace disease is seen the lung bases bilaterally and in the right mid lung zone. Mild elevation of the right hemidiaphragm relative to the left is unchanged. Heart size is upper normal.  IMPRESSION: Right worse than left airspace disease most worrisome for pneumonia.   Electronically Signed   By: Drusilla Kanner M.D.   On: 11/05/2014 15:55   Dg Abd Portable 1v  12/04/2014   CLINICAL DATA:  Vomiting.  EXAM: PORTABLE ABDOMEN - 1 VIEW  COMPARISON:  Abdominal radiographs 07/12/2012  FINDINGS: No dilated bowel loops to  suggest obstruction. There is generalized paucity of intra-abdominal bowel gas. Stool is seen in the rectum. No radiopaque calculi. Right basilar consolidation is seen. Broad-based scoliotic curvature of the spine.  IMPRESSION: No evidence of bowel obstruction with relative paucity of bowel gas in the abdomen. Stool distends the rectum.   Electronically Signed   By: Rubye Oaks M.D.   On: 12/04/2014 04:40    Medications: Scheduled Meds: . antiseptic oral rinse  7 mL Mouth Rinse q12n4p  . chlorhexidine  15 mL Mouth Rinse BID  . Chlorhexidine Gluconate Cloth  6 each Topical Q0600  . cloNIDine  0.2 mg Oral BID  . cycloSPORINE  1 drop Both Eyes BID  . dutasteride  0.5 mg Oral QPC supper  . enoxaparin (LOVENOX) injection  40 mg Subcutaneous Q24H  . escitalopram  20 mg Oral Q breakfast  . lacosamide (VIMPAT) IV  150 mg Intravenous Q12H  . levalbuterol  0.63 mg Nebulization TID  . metoprolol  2.5 mg Intravenous 4 times per day  . multivitamin with minerals   Oral Q breakfast  . mupirocin ointment  1 application Nasal BID  . pantoprazole (PROTONIX) IV  40 mg Intravenous Q12H  . piperacillin-tazobactam (ZOSYN)  IV  3.375 g Intravenous Q8H  . prazosin  2 mg Oral QHS  . tamsulosin  0.4 mg Oral QPC supper  . vancomycin  500 mg Intravenous Q12H   Time spent 25 minutes     Lisa Blakeman M.D. [facility] 12/04/2014, 11:20 AM Pager: [phone]  If 7PM-7AM, please contact night-coverage [email or web address] Password [email or web address]

## 2014-12-04 NOTE — Evaluation (Signed)
Clinical/Bedside Swallow Evaluation Patient Details  Name: Colin BalloonJerry W Duque MRN: [id number] Date of Birth: 1951-10-02  Today's Date: 12/04/2014 Time: SLP Start Time (ACUTE ONLY): [id number] SLP Stop Time (ACUTE ONLY): 0947 SLP Time Calculation (min) (ACUTE ONLY): 11 min  Past Medical History:  Past Medical History  Diagnosis Date  . Seizures   . Cerebral palsy   . Hypertension   . Mental retardation   . Depression   . Nephrolithiasis   . BPH (benign prostatic hyperplasia)   . GI bleed 2009    coffee ground emesis, erosive esophagitis on EGD by Dr Juanda ChanceBrodie  . Esophageal stricture 2009    not dilated during the EGD, biopsy benign:  no Barretts  . Depression with anxiety    Past Surgical History:  Past Surgical History  Procedure Laterality Date  . Basal cell carcinoma excision  2002    forehead  . Squamous cell carcinoma excision  2004    lip  . Bowen dz  2002    right cheek   . Esophagogastroduodenoscopy  06/14/2012    Procedure: ESOPHAGOGASTRODUODENOSCOPY (EGD);  Surgeon: Rachael Feeaniel P Jacobs, MD;  [facility];  Service: Endoscopy;  Laterality: N/A;  . Multiple extractions with alveoloplasty N/A 06/20/2014    Procedure: MULTIPLE EXTRACTIONS TEETH NUMBER TWO, TEN, TWELVE, EIGHTEEN, TWENTY-ONE, TWENTY-TWO, TWENTY-NINE, THIRTY-TWO;  Surgeon: Georgia LopesScott M Jensen, DDS;  Location: [facility] OR;  Service: Oral Surgery;  Laterality: N/A;   HPI:  64 year old male resident of [facility] with past medical hx cerebral palsy intellectual disabilty and developmental delay, seizure disorder, hypertension, history of GI bleed with recent hospitalization for altered mental status secondary to healthcare associated pneumonia and dehydration admitted for altered mental status since last evening.CT showed chronic atrophic and ischemic changes noted acute abnormality. Per MD note patient has had good appetite and taking his medications. RN note of emesis this am. CXR infiltration or atelectasis in the right  lung base with right pleural effusion may indicate pneumonia. BSE 11/07/14 did nto reveal indications of aspiraiton across consistencies and recommended Dys 3 and nectar thick liquids (SLP noted to "continue" nectar thick, assuming on nectar prior to assessment; uncertain initiated by hospital MD or SNF?).   Assessment / Plan / Recommendation Clinical Impression  Pt lethargic, eyes open midway, head in flexed position and presently not following SLP's commands. Attempted to clean oral cavity, pt would not open dentition to for oral swab, ice chip or straw. Continue NPO with continue oral care attempts. SLP will continue efforts and will need an objective swallow assessment when pt able to participate.       Aspiration Risk  Severe    Diet Recommendation NPO   Medication Administration: Via alternative means    Other  Recommendations Oral Care Recommendations:  (per protocol)   Follow Up Recommendations   (TBD)    Frequency and Duration min 2x/week  2 weeks   Pertinent Vitals/Pain Difficult to assess      Swallow Study          Oral/Motor/Sensory Function Overall Oral Motor/Sensory Function:  (unable to assess)   Ice Chips Ice chips:  (would not open cavity to receive)   Thin Liquid Thin Liquid:  (no response to straw sip attempts)    Nectar Thick Nectar Thick Liquid: Not tested   Honey Thick Honey Thick Liquid: Not tested   Puree Puree: Not tested   Solid   GO    Solid: Not tested       Verlon SettingLitaker, Jaycey Gens  Willis 12/04/2014,10:06 AM   Breck Coons Lonell Face.Ed [employer] [phone]

## 2014-12-04 NOTE — Progress Notes (Signed)
Notified md about pt bp 189/93. Also bile looking emesis noted on pt's gown and bed.  New orders received.  Will continue to monitor. Karena Addisonoro, Faizah Kandler T

## 2014-12-04 NOTE — Progress Notes (Signed)
ANTIBIOTIC CONSULT NOTE - FOLLOW UP  Pharmacy Consult for Vanc/Zosyn Indication: pneumonia  No Known Allergies  Patient Measurements: Weight: 115 lb 1.3 oz (52.2 kg)  Vital Signs: Temp: 98.7 F (37.1 C) (02/28 1342) Temp Source: Axillary (02/28 1342) BP: 197/96 mmHg (02/28 1342) Pulse Rate: 115 (02/28 1342) Intake/Output from previous day: 02/27 0701 - 02/28 0700 In: 250 [IV Piggyback:250] Out: 951 [Urine:951] Intake/Output from this shift:    Labs:  Recent Labs  12/02/14 0643 12/03/14 0516 12/03/14 1650 12/04/14 0325  WBC 5.2  --  10.5 27.7*  HGB 13.3  --  12.9* 13.4  PLT 160  --  162 171  CREATININE 1.04 0.92  --  1.24   Estimated Creatinine Clearance: 44.4 mL/min (by C-G formula based on Cr of 1.24). No results for input(s): VANCOTROUGH, VANCOPEAK, VANCORANDOM, GENTTROUGH, GENTPEAK, GENTRANDOM, TOBRATROUGH, TOBRAPEAK, TOBRARND, AMIKACINPEAK, AMIKACINTROU, AMIKACIN in the last 72 hours.   Microbiology: Recent Results (from the past 720 hour(s))  Culture, blood (routine x 2)     Status: None   Collection Time: 11/05/14  3:26 PM  Result Value Ref Range Status   Specimen Description BLOOD RIGHT WRIST  Final   Special Requests BOTTLES DRAWN AEROBIC AND ANAEROBIC 10 CC EACH  Final   Culture   Final    NO GROWTH 5 DAYS Performed at [facility]    Report Status 11/11/2014 FINAL  Final  Culture, blood (routine x 2)     Status: None   Collection Time: 11/05/14  3:26 PM  Result Value Ref Range Status   Specimen Description BLOOD LEFT HAND  Final   Special Requests BOTTLES DRAWN AEROBIC AND ANAEROBIC 5 CC EACH  Final   Culture   Final    NO GROWTH 5 DAYS Performed at [facility]    Report Status 11/11/2014 FINAL  Final  Urine culture     Status: None   Collection Time: 11/05/14  4:18 PM  Result Value Ref Range Status   Specimen Description URINE, CATHETERIZED  Final   Special Requests NONE  Final   Colony Count   Final    1,000  COLONIES/ML Performed at [facility]    Culture   Final    STAPHYLOCOCCUS SPECIES (COAGULASE NEGATIVE) Note: RIFAMPIN AND GENTAMICIN SHOULD NOT BE USED AS SINGLE DRUGS FOR TREATMENT OF STAPH INFECTIONS. Performed at [facility]    Report Status 11/08/2014 FINAL  Final   Organism ID, Bacteria STAPHYLOCOCCUS SPECIES (COAGULASE NEGATIVE)  Final      Susceptibility   Staphylococcus species (coagulase negative) - MIC*    GENTAMICIN <=0.5 SENSITIVE Sensitive     LEVOFLOXACIN 0.5 SENSITIVE Sensitive     NITROFURANTOIN <=16 SENSITIVE Sensitive     OXACILLIN <=0.25 SENSITIVE Sensitive     PENICILLIN >=0.5 RESISTANT Resistant     RIFAMPIN <=0.5 SENSITIVE Sensitive     TRIMETH/SULFA <=10 SENSITIVE Sensitive     VANCOMYCIN <=0.5 SENSITIVE Sensitive     TETRACYCLINE <=1 SENSITIVE Sensitive     * STAPHYLOCOCCUS SPECIES (COAGULASE NEGATIVE)  MRSA PCR Screening     Status: Abnormal   Collection Time: 12/02/14  2:28 PM  Result Value Ref Range Status   MRSA by PCR POSITIVE (A) NEGATIVE Final    Comment:        The GeneXpert MRSA Assay (FDA approved for NASAL specimens only), is one component of a comprehensive MRSA colonization surveillance program. It is not intended to diagnose MRSA infection nor to guide or monitor  treatment for MRSA infections. RESULT CALLED TO, READ BACK BY AND VERIFIED WITH: Sharol Roussel RN 17:10 12/02/14 ([email or web address])     Anti-infectives    Start     Dose/Rate Route Frequency Ordered Stop   12/03/14 1700  vancomycin (VANCOCIN) 500 mg in sodium chloride 0.9 % 100 mL IVPB     500 mg 100 mL/hr over 60 Minutes Intravenous Every 12 hours 12/03/14 1633     12/03/14 1700  piperacillin-tazobactam (ZOSYN) IVPB 3.375 g     3.375 g 12.5 mL/hr over 240 Minutes Intravenous Every 8 hours 12/03/14 1633        Assessment: 8 YOM presented with history of AMS for one day.  Pharmacy consulted to manage vancomycin and Zosyn for PNA, concerning for aspiration.  CXR  showed atelectasis, otherwise lungs are clear. Vanc/Zosyn D#2 for PNA, afebrile, WBC 10.5>>27. Afebrile. Tmax 100.4.  Vanc 2/27 >> Zosyn 2/27 >>  2/26 MRSA PCR - positive 2/27 BCx x2 - 2/27 UCx -  Goal of Therapy:  Vancomycin trough level 15-20 mcg/ml  Plan:   Decrease Vanc  IV q24 Continue Zosyn 3.375gm IV Q8H, 4 hr infusion Monitor renal fxn, clinical progress, vanc trough at Css F/U with de-escalating abx, LOT  Thank you for allowing pharmacy to be part of this patient's care team  Harriette Tovey M. Nicolasa Milbrath, Pharm.D Clinical Pharmacy Resident Pager: [phone] 12/04/2014 .2:46 PM

## 2014-12-04 NOTE — Progress Notes (Signed)
Utilization Review Completed.   Aleighna Wojtas, RN, BSN Nurse Case Manager  

## 2014-12-05 ENCOUNTER — Inpatient Hospital Stay (HOSPITAL_COMMUNITY): Payer: Medicare Other

## 2014-12-05 LAB — CBC
HEMATOCRIT: 37.5 % — AB (ref 39.0–52.0)
HEMOGLOBIN: 12.8 g/dL — AB (ref 13.0–17.0)
MCH: 31.6 pg (ref 26.0–34.0)
MCHC: 34.1 g/dL (ref 30.0–36.0)
MCV: 92.6 fL (ref 78.0–100.0)
Platelets: 152 10*3/uL (ref 150–400)
RBC: 4.05 MIL/uL — ABNORMAL LOW (ref 4.22–5.81)
RDW: 14.1 % (ref 11.5–15.5)
WBC: 20 10*3/uL — ABNORMAL HIGH (ref 4.0–10.5)

## 2014-12-05 LAB — BASIC METABOLIC PANEL
Anion gap: 9 (ref 5–15)
BUN: 12 mg/dL (ref 6–23)
CO2: 19 mmol/L (ref 19–32)
Calcium: 8.5 mg/dL (ref 8.4–10.5)
Chloride: 107 mmol/L (ref 96–112)
Creatinine, Ser: 1.08 mg/dL (ref 0.50–1.35)
GFR calc Af Amer: 82 mL/min — ABNORMAL LOW (ref >90)
GFR calc non Af Amer: 71 mL/min — ABNORMAL LOW (ref >90)
Glucose, Bld: 87 mg/dL (ref 70–99)
POTASSIUM: 3.1 mmol/L — AB (ref 3.5–5.1)
Sodium: 135 mmol/L (ref 135–145)

## 2014-12-05 LAB — PROCALCITONIN: Procalcitonin: 6.19 ng/mL

## 2014-12-05 MED ORDER — HYDRALAZINE HCL 20 MG/ML IJ SOLN
20.0000 mg | Freq: Four times a day (QID) | INTRAMUSCULAR | Status: DC | PRN
  Administered 2014-12-05 – 2014-12-07 (×2): 20 mg via INTRAVENOUS
  Filled 2014-12-05 (×2): qty 1

## 2014-12-05 MED ORDER — LISINOPRIL 20 MG PO TABS
30.0000 mg | ORAL_TABLET | Freq: Every day | ORAL | Status: DC
  Administered 2014-12-05 – 2014-12-07 (×3): 30 mg via ORAL
  Filled 2014-12-05 (×4): qty 1

## 2014-12-05 MED ORDER — METOPROLOL TARTRATE 1 MG/ML IV SOLN
5.0000 mg | Freq: Three times a day (TID) | INTRAVENOUS | Status: DC
  Administered 2014-12-05 – 2014-12-07 (×6): 5 mg via INTRAVENOUS
  Filled 2014-12-05 (×7): qty 5

## 2014-12-05 MED ORDER — LABETALOL HCL 5 MG/ML IV SOLN
20.0000 mg | Freq: Once | INTRAVENOUS | Status: AC
  Administered 2014-12-05: 20 mg via INTRAVENOUS
  Filled 2014-12-05: qty 4

## 2014-12-05 MED ORDER — HYDRALAZINE HCL 20 MG/ML IJ SOLN
10.0000 mg | Freq: Once | INTRAMUSCULAR | Status: AC
  Administered 2014-12-05: 10 mg via INTRAVENOUS

## 2014-12-05 MED ORDER — POTASSIUM CHLORIDE 10 MEQ/100ML IV SOLN
10.0000 meq | INTRAVENOUS | Status: AC
  Administered 2014-12-05 (×4): 10 meq via INTRAVENOUS
  Filled 2014-12-05: qty 100

## 2014-12-05 NOTE — Progress Notes (Signed)
PT Cancellation Note  Patient Details Name: Colin Bradley MRN: [id number] DOB: 07-Dec-1950   Cancelled Treatment:    Reason Eval/Treat Not Completed: Patient at procedure or test/unavailable, off the floor, will attempt next day.   Fabio AsaWerner, Davidlee Jeanbaptiste J 12/05/2014, 3:33 PM Charlotte Crumbevon Bryla Burek, PT DPT  [phone]

## 2014-12-05 NOTE — Procedures (Signed)
Objective Swallowing Evaluation:    Patient Details  Name: Colin Bradley MRN: [id number] Date of Birth: Jan 08, 1951  Today's Date: 12/05/2014 Time: SLP Start Time (ACUTE ONLY): 1315-SLP Stop Time (ACUTE ONLY): 1330 SLP Time Calculation (min) (ACUTE ONLY): 15 min  Past Medical History:  Past Medical History  Diagnosis Date  . Seizures   . Cerebral palsy   . Hypertension   . Mental retardation   . Depression   . Nephrolithiasis   . BPH (benign prostatic hyperplasia)   . GI bleed 2009    coffee ground emesis, erosive esophagitis on EGD by Dr Juanda Chance  . Esophageal stricture 2009    not dilated during the EGD, biopsy benign:  no Barretts  . Depression with anxiety    Past Surgical History:  Past Surgical History  Procedure Laterality Date  . Basal cell carcinoma excision  2002    forehead  . Squamous cell carcinoma excision  2004    lip  . Bowen dz  2002    right cheek   . Esophagogastroduodenoscopy  06/14/2012    Procedure: ESOPHAGOGASTRODUODENOSCOPY (EGD);  Surgeon: Rachael Fee, MD;  Location: [facility] ENDOSCOPY;  Service: Endoscopy;  Laterality: N/A;  . Multiple extractions with alveoloplasty N/A 06/20/2014    Procedure: MULTIPLE EXTRACTIONS TEETH NUMBER TWO, TEN, TWELVE, EIGHTEEN, TWENTY-ONE, TWENTY-TWO, TWENTY-NINE, THIRTY-TWO;  Surgeon: Georgia Lopes, DDS;  Location: [facility] OR;  Service: Oral Surgery;  Laterality: N/A;   HPI:  HPI: 64 year old male resident of [facility] with past medical hx cerebral palsy intellectual disabilty and developmental delay, seizure disorder, hypertension, history of GI bleed with recent hospitalization for altered mental status secondary to healthcare associated pneumonia and dehydration admitted for altered mental status since last evening.CT showed chronic atrophic and ischemic changes noted acute abnormality. Per MD note patient has had good appetite and taking his medications. RN note of emesis this am. CXR infiltration or atelectasis in  the right lung base with right pleural effusion may indicate pneumonia. BSE 11/07/14 did nto reveal indications of aspiraiton across consistencies and recommended Dys 3 and nectar thick liquids (SLP noted to "continue" nectar thick, assuming on nectar prior to assessment; uncertain initiated by hospital MD or SNF?).  No Data Recorded  Assessment / Plan / Recommendation CHL IP CLINICAL IMPRESSIONS 12/05/2014  Dysphagia Diagnosis (None)  Clinical impression Pt presents with mild pharyngeal phase dysphagia chactarized by delayed swallow initiation to the level of the valleculae and reduced base of tongue retraction. Noted vallecular and pyriform residue following swallow. SLP unable to evaluate further; pt fatigued and orally holding bolluses. Did not observe aspiration or penetration across consistencies, however, pt is at moderate risk for aspiration due to poor mental status, delayed swallow initiation and reduced sensation. Recommend pt initiate dys 2/ nectar thin liquid diet if family is willing to accept risk of aspiration. Recommend consult with palliative care due to repeated hospitalizations and aspiration risk. Speech will follow-up for diet tolerance and possible upgrade.       CHL IP TREATMENT RECOMMENDATION 12/05/2014  Treatment Plan Recommendations Therapy as outlined in treatment plan below     CHL IP DIET RECOMMENDATION 12/05/2014  Diet Recommendations Dysphagia 2 (Fine chop);Nectar-thick liquid  Liquid Administration via Cup;Straw  Medication Administration Whole meds with puree  Compensations Slow rate;Small sips/bites  Postural Changes and/or Swallow Maneuvers Seated upright 90 degrees     CHL IP OTHER RECOMMENDATIONS 12/05/2014  Recommended Consults Other (Comment)  Oral Care Recommendations Oral care BID  Other Recommendations Order  thickener from pharmacy     CHL IP FOLLOW UP RECOMMENDATIONS 12/05/2014  Follow up Recommendations Other (comment)     CHL IP FREQUENCY AND DURATION  12/05/2014  Speech Therapy Frequency (ACUTE ONLY) min 2x/week  Treatment Duration 2 weeks     Pertinent Vitals/Pain NA    SLP Swallow Goals No flowsheet data found.  No flowsheet data found.    No flowsheet data found.   No flowsheet data found.    CHL IP PHARYNGEAL PHASE 12/05/2014  Pharyngeal Phase (None)  Pharyngeal - Pudding Teaspoon (None)  Penetration/Aspiration details (pudding teaspoon) (None)  Pharyngeal - Pudding Cup (None)  Penetration/Aspiration details (pudding cup) (None)  Pharyngeal - Honey Teaspoon (None)  Penetration/Aspiration details (honey teaspoon) (None)  Pharyngeal - Honey Cup (None)  Penetration/Aspiration details (honey cup) (None)  Pharyngeal - Honey Syringe (None)  Penetration/Aspiration details (honey syringe) (None)  Pharyngeal - Nectar Teaspoon (None)  Penetration/Aspiration details (nectar teaspoon) (None)  Pharyngeal - Nectar Cup (None)  Penetration/Aspiration details (nectar cup) (None)  Pharyngeal - Nectar Straw (None)  Penetration/Aspiration details (nectar straw) (None)  Pharyngeal - Nectar Syringe (None)  Penetration/Aspiration details (nectar syringe) (None)  Pharyngeal - Ice Chips (None)  Penetration/Aspiration details (ice chips) (None)  Pharyngeal - Thin Teaspoon Delayed swallow initiation;Premature spillage to valleculae;Reduced laryngeal elevation;Pharyngeal residue - valleculae;Pharyngeal residue - pyriform sinuses;Reduced tongue base retraction  Penetration/Aspiration details (thin teaspoon) (None)  Pharyngeal - Thin Cup Delayed swallow initiation;Premature spillage to valleculae;Reduced laryngeal elevation;Pharyngeal residue - valleculae;Pharyngeal residue - pyriform sinuses;Reduced tongue base retraction;Premature spillage to pyriform sinuses  Penetration/Aspiration details (thin cup) (None)  Pharyngeal - Thin Straw Delayed swallow initiation;Premature spillage to valleculae;Reduced laryngeal elevation;Pharyngeal residue -  valleculae;Pharyngeal residue - pyriform sinuses;Reduced tongue base retraction;Premature spillage to pyriform sinuses  Penetration/Aspiration details (thin straw) (None)  Pharyngeal - Thin Syringe (None)  Penetration/Aspiration details (thin syringe') (None)  Pharyngeal - Puree Delayed swallow initiation;Premature spillage to valleculae;Reduced laryngeal elevation;Pharyngeal residue - valleculae;Pharyngeal residue - pyriform sinuses;Reduced tongue base retraction  Penetration/Aspiration details (puree) (None)  Pharyngeal - Mechanical Soft (None)  Penetration/Aspiration details (mechanical soft) (None)  Pharyngeal - Regular Delayed swallow initiation;Premature spillage to valleculae;Reduced laryngeal elevation;Pharyngeal residue - valleculae;Pharyngeal residue - pyriform sinuses;Reduced tongue base retraction  Penetration/Aspiration details (regular) (None)  Pharyngeal - Multi-consistency (None)  Penetration/Aspiration details (multi-consistency) (None)  Pharyngeal - Pill (None)  Penetration/Aspiration details (pill) (None)  Pharyngeal Comment (None)     No flowsheet data found.  No flowsheet data found.  Leonia CoronaMaida Bermudez-Bosch, SLP student  Supervised and reviewed by Harlon DittyBonnie Miaya Lafontant MA CCC-SLP        Christo Hain, Riley NearingBonnie Caroline 12/05/2014, 2:48 PM

## 2014-12-05 NOTE — Progress Notes (Addendum)
Clinical Social Work Department BRIEF PSYCHOSOCIAL ASSESSMENT 12/05/2014  Patient:  Sharion BalloonMEREDITH,Sawyer W     Account Number:  [id number]     Admit date:  12/02/2014  Clinical Social Worker:  Harless NakayamaAMBELAL,Keyunna Coco, LCSWA  Date/Time:  12/05/2014 11:52 AM  Referred by:  Physician  Date Referred:  12/05/2014 Referred for  SNF Placement   Other Referral:   Interview type:  Other - See comment Other interview type:   Spoke with pt guardian over the phone    PSYCHOSOCIAL DATA Living Status:  FACILITY [facility] Level of care:  Assisted Living Primary support name:  Hoover BrunetteRoxane Brand [phone] Primary support relationship to patient:  NONE Degree of support available:   [facility] [place] [facility]    CURRENT CONCERNS Current Concerns  Post-Acute Placement   Other Concerns:    SOCIAL WORK ASSESSMENT / PLAN CSW (Clinical Social Worker) received consult that pt was admitted from [facility]. Per [facility] pt is not a resident at their facility. CSW complete chart review and became aware that pt has appointed Guardian and was [facility]. CSW left voicemail for Hoover BrunetteRoxane Brand [phone]. If CSW does not receive return phone call by this afternoon, will attempt to reach guardian supervisor. CSW did [facility] with [facility] who confirmed pt was admitted from their facility and they would like pt to return at discharge. Per Porscha, pt dependent for all ADLs except feeding before admission. Facility requested faxed FL2 on day of discharge (fax number [phone]).      CSW received call back from Roxane. She confirmed she is pt guardian and pt was admitted from ALF. Per Roxane, pt has good relationship with ALF staff and plan will be for pt to return at discharge. Roxane had questions about pt medical condition and discharge date. CSW provided with available information from MD note  but unable to specify on dc plan at this time. Roxane confirmed she did receive MD voicemail this morning and called unit this morning.   Assessment/plan status:  Psychosocial Support/Ongoing Assessment of Needs Other assessment/ plan:   Information/referral to community resources:   None needed    PATIENT'S/FAMILY'S RESPONSE TO PLAN OF CARE: Pt guardian confirms plan will be for pt to return to ALF at discharge.      Tryphena Perkovich, LCSWA  [phone]

## 2014-12-05 NOTE — Procedures (Signed)
ELECTROENCEPHALOGRAM REPORT  Patient: Colin Bradley       Room #: 0Q652H28 EEG No. ID: [id number] Age: 64 y.o.        Sex: male Referring Physician: RAI, R Report Date:  12/05/2014        Interpreting Physician: Aline BrochureSTEWART,Emori Kamau R  History: Colin Bradley is an [age] y.o. male with a history of cerebral palsy and mental retardation as well as hypertension and GI bleed admitted with altered mental status associated with acute pneumonia and dehydration. Altered mental status has persisted. No seizure activity reported.  Indications for study:  Assess severity of encephalopathy; rule out seizure activity.  Technique: This is an 18 channel routine scalp EEG performed at the bedside with bipolar and monopolar montages arranged in accordance to the international 10/20 system of electrode placement.   Description: This EEG recording was performed during wakefulness. Predominant background activity consisted of diffuse moderate slowing of cerebral activity with low amplitude 1-2 Hz diffuse delta activity, with superimposed diffuse 5-6 Hz theta activity. Photic stimulation and hyperventilation were not performed. No epileptiform discharges were recorded.  Interpretation: This EEG recording is abnormal with moderate generalized nonspecific continuous slowing of cerebral activity, consistent with patient's history of cerebral palsy and developmental delay. No evidence of epileptic activity was demonstrated. Please be aware that the lack of epileptiform activity on EEG recording does not rule out seizure disorder.   Venetia MaxonR Derold Dorsch M.D. [facility] [phone]

## 2014-12-05 NOTE — Progress Notes (Signed)
Speech Language Pathology Treatment: Dysphagia  Patient Details Name: Colin Bradley MRN: [id number] DOB: 12-01-1950 Today's Date: 12/05/2014 Time: [id number] SLP Time Calculation (min) (ACUTE ONLY): 18 min  Assessment / Plan / Recommendation Clinical Impression  Pt now alert and participatory with cues. Pt does demonstrate delayed swallow, possible wet vocal quality and delayed cough. Given multiple admissions for pna, concerning for aspiration, recommend objective testing prior to initiating diet. RN may give pill whole in puree.    HPI HPI: 64 year old male resident of [facility] with past medical hx cerebral palsy intellectual disabilty and developmental delay, seizure disorder, hypertension, history of GI bleed with recent hospitalization for altered mental status secondary to healthcare associated pneumonia and dehydration admitted for altered mental status since last evening.CT showed chronic atrophic and ischemic changes noted acute abnormality. Per MD note patient has had good appetite and taking his medications. RN note of emesis this am. CXR infiltration or atelectasis in the right lung base with right pleural effusion may indicate pneumonia. BSE 11/07/14 did nto reveal indications of aspiraiton across consistencies and recommended Dys 3 and nectar thick liquids (SLP noted to "continue" nectar thick, assuming on nectar prior to assessment; uncertain initiated by hospital MD or SNF?).   Pertinent Vitals    SLP Plan  MBS    Recommendations Diet recommendations: NPO Medication Administration: Whole meds with puree              Oral Care Recommendations: Oral care BID Follow up Recommendations: Other (comment) (TBD) Plan: MBS    GO    Harlon DittyBonnie Jessenya Berdan, MA CCC-SLP [phone]  Claudine MoutonDeBlois, Nakyra Bourn Caroline 12/05/2014, 9:11 AM

## 2014-12-05 NOTE — Progress Notes (Addendum)
INITIAL NUTRITION ASSESSMENT  DOCUMENTATION CODES Per approved criteria  -Underweight   INTERVENTION: -RD to follow for diet advancement -Supplement diet as appropriate -If pt unable to safely tolerate PO diet and requires nutrition support, recommend: Initiate Jevity 1.2 @ 20 ml/hr via NGT and increase by 10 ml every 4 hours to goal rate of 50 ml/hr.   Tube feeding regimen provides 1440 kcal (100% of needs), 67 grams of protein, and 968 ml of H2O.   NUTRITION DIAGNOSIS: Inadequate oral intake related to inability to eat as evidenced by NPO.   Goal: Pt will meet >90% of estimated nutritional needs  Monitor:  Diet advancement, PO intake, initiation of nutrition support, labs, weight changes, I/O's  Reason for Assessment: Low Braden  64 y.o. male  Admitting Dx: Acute encephalopathy  64 year old male resident of [facility] with past medical hx cerebral palsy with mental retardation, developmental delay, seizure disorder, hypertension, history of GI bleed with recent hospitalization for altered mental status secondary to healthcare associated pneumonia and dehydration sent to the ED this morning for altered mental status since last evening. At baseline patient is well oriented and very talkative. He has been wheelchair-bound for several years. He does not have any bowel or urinary incontinence.   ASSESSMENT: Pt admitted with acute encephalopathy.  Pt somnolent at time of visit and unable to participate in interview. No family members at bedside. His home diet is Dys 3 and nectar thick liquids. Per H&P, pt was eating well PTA. SLP completed BSE and reccommended pt remain NPO due to concern for aspiration. Pt awaiting MBSS for further evaluation. Plan is to begin TF if pt unable to pass MBSS.  Wt hx reveals that wt has been stable over the past 3 years.  Nutrition-focused physical exam revealed no signs of fat or muscle depletion.  Labs reviewed. K: 3.1.   Height: Ht  Readings from Last 1 Encounters:  11/05/14  (1.702 m)    Weight: Wt Readings from Last 1 Encounters:  12/03/14 115 lb 1.3 oz (52.2 kg)    Ideal Body Weight: 148#  % Ideal Body Weight: 78%  Wt Readings from Last 10 Encounters:  12/03/14 115 lb 1.3 oz (52.2 kg)  11/05/14 115 lb (52.164 kg)  06/14/12 111 lb 5.3 oz (50.5 kg)  10/24/08 130 lb (58.968 kg)    Usual Body Weight: 115#  % Usual Body Weight: 100%  BMI:  Body mass index is 18.02 kg/(m^2). Underweight  Estimated Nutritional Needs: Kcal: 1400-1600 Protein: 60-70 grams Fluid: >1.5 L  Skin: MASD on groin and perineum  Diet Order: Diet NPO time specified Except for: Ice Chips, Sips with Meds  EDUCATION NEEDS: -Education not appropriate at this time   Intake/Output Summary (Last 24 hours) at 12/05/14 1111 Last data filed at 12/05/14 0900  Gross per 24 hour  Intake 1886.25 ml  Output    351 ml  Net 1535.25 ml    Last BM: PTA   Labs:   Recent Labs Lab 12/03/14 0516 12/04/14 0325 12/05/14 0315  NA 137 140 135  K 3.5 3.7 3.1*  CL 110 106 107  CO2 BUN CREATININE 0.92 1.24 1.08  CALCIUM 8.5 9.1 8.5  GLUCOSE 91 91 87    CBG (last 3)   Recent Labs  12/03/14 1601  GLUCAP 123*    Scheduled Meds: . antiseptic oral rinse  7 mL Mouth Rinse q12n4p  . chlorhexidine  15 mL Mouth Rinse  BID  . Chlorhexidine Gluconate Cloth  6 each Topical Q0600  . cloNIDine  0.2 mg Oral BID  . cycloSPORINE  1 drop Both Eyes BID  . dutasteride  0.5 mg Oral QPC supper  . enoxaparin (LOVENOX) injection  40 mg Subcutaneous Q24H  . escitalopram  20 mg Oral Q breakfast  . lacosamide (VIMPAT) IV  150 mg Intravenous Q12H  . levalbuterol  0.63 mg Nebulization TID  . metoprolol  2.5 mg Intravenous 4 times per day  . multivitamin with minerals   Oral Q breakfast  . mupirocin ointment  1 application Nasal BID  . pantoprazole (PROTONIX) IV  40 mg Intravenous Q12H  . piperacillin-tazobactam (ZOSYN)  IV   3.375 g Intravenous Q8H  . prazosin  2 mg Oral QHS  . tamsulosin  0.4 mg Oral QPC supper  . vancomycin  750 mg Intravenous Q24H    Continuous Infusions: . dextrose 5 % and 0.45% NaCl 75 mL (12/04/14 2012)    Past Medical History  Diagnosis Date  . Seizures   . Cerebral palsy   . Hypertension   . Mental retardation   . Depression   . Nephrolithiasis   . BPH (benign prostatic hyperplasia)   . GI bleed 2009    coffee ground emesis, erosive esophagitis on EGD by Dr Juanda ChanceBrodie  . Esophageal stricture 2009    not dilated during the EGD, biopsy benign:  no Barretts  . Depression with anxiety     Past Surgical History  Procedure Laterality Date  . Basal cell carcinoma excision  2002    forehead  . Squamous cell carcinoma excision  2004    lip  . Bowen dz  2002    right cheek   . Esophagogastroduodenoscopy  06/14/2012    Procedure: ESOPHAGOGASTRODUODENOSCOPY (EGD);  Surgeon: Rachael Feeaniel P Jacobs, MD;  [facility];  Service: Endoscopy;  Laterality: N/A;  . Multiple extractions with alveoloplasty N/A 06/20/2014    Procedure: MULTIPLE EXTRACTIONS TEETH NUMBER TWO, TEN, TWELVE, EIGHTEEN, TWENTY-ONE, TWENTY-TWO, TWENTY-NINE, THIRTY-TWO;  Surgeon: Georgia LopesScott M Jensen, DDS;  Location: [facility] OR;  Service: Oral Surgery;  Laterality: N/A;    Katura Eatherly A. Mayford KnifeWilliams, RD, LDN, CDE Pager: [phone] After hours Pager: [phone]

## 2014-12-05 NOTE — Progress Notes (Signed)
Patient ID: Colin Bradley  male  ZOX:[id number]    DOB: [birth year]    DOA: 12/02/2014  PCP: Julian HySOUTH,STEPHEN ALAN, MD   Brief history of present illness 64 year old male resident of [facility] with past medical hx cerebral palsy with mental retardation, developmental delay, seizure disorder, hypertension, history of GI bleed with recent hospitalization for altered mental status secondary to healthcare associated pneumonia and dehydration sent to the ED this morning for altered mental status since last evening. At baseline patient is well oriented and very talkative. He has been wheelchair-bound for several years. He does not have any bowel or urinary incontinence.  Over the last 3 days he was having mental status changes prior to admission, he was noticed to be alert but minimally talkative.Nursing home staff had difficulty giving him his medications. On the morning of admission, he was difficult to arouse and not talking so was sent to the ED. Patient was started on baclofen since 2/23 as a muscle relaxant for his legs as they were stiff and patient had difficulty bending his knees on the wheelchair.  Assessment/Plan: Principal Problem:   Acute encephalopathy: Currently alert and awake, likely due to initiation of baclofen, also on multiple different medications including Vimpat, antidepressant and BZD and UTI - Head CT showed chronic atrophic and ischemic changes noted acute abnormality - TSH 2.1 -  Continue seizure precautions, neuro checks, baclofen discontinued - PT evaluation recommended snf, had notable myoclonic jerk type of months during the PT evaluation - EEG showed: abnormal with moderate generalized nonspecific continuous slowing of cerebral activity consistent with history of cerebral palsy and mental delay, no evidence of epileptic activity was demonstrated.  Active Problems:  SIRS secondary to Aspiration pneumonia - Pro calcitonin 6.19 - Chest x-ray showed  infiltration or atelectasis in the right lung base with a right pleural effusion may indicate pneumonia - Continue IV Zosyn.  - Swallow evaluation recommended NPO, MBS planned today if patient fails, will need temporary feeding tube. - Blood cultures negative so far, continue vancomycin and Zosyn   Urinary tract infection - Continue IV Zosyn, follow urine culture and sensitivities  HypertensionV tachycardia - Currently NPO, placed on scheduled IV Lopressor    Depression, cerebral palsy and mental retardation - Per the nursing staff at the facility patient is well oriented and talkative  Hypothermia - Resolved likely due to medication  Seizure disorder Continue IV Vimpat, Ativan as needed Continue seizure precautions  EEG done  Hypokalemia Replaced  History of GI bleed Continue PPI   DVT Prophylaxis:Lovenox  Code Status:Full code  Family Communication: Spoke with patient's father, Mr. Haskel SchroederCJ Mcilvain on the phone and updated in detail  Disposition:  Consultants: none Procedures: CT head  EEG  Antibiotics:  None  Subjective: Patient seen and examined, alert and awake, feels tired. No acute issues overnight   Objective: Weight change:   Intake/Output Summary (Last 24 hours) at 12/05/14 1145 Last data filed at 12/05/14 0900  Gross per 24 hour  Intake 1886.25 ml  Output    351 ml  Net 1535.25 ml   Blood pressure 192/97, pulse 105, temperature 98.6 F (37 C), temperature source Oral, resp. rate 14, weight 52.2 kg (115 lb 1.3 oz), SpO2 94 %.  Physical Exam: General: Alert and awake, NAD, responds yes and no to the questions, also states I feel tired  CVS: S1-S2 clear Chest:Crackles at the right lung base,  no wheezing  Abdomen: soft nontender, nondistended, normal bowel sounds  Extremities: no  cyanosis, clubbing, + trace edema   Lab Results: Basic Metabolic Panel:  Recent Labs Lab 12/04/14 0325 12/05/14 0315  NA 140 135  K 3.7 3.1*  CL 106 107  CO2  22 19  GLUCOSE 91 87  BUN 11 12  CREATININE 1.24 1.08  CALCIUM 9.1 8.5   Liver Function Tests:  Recent Labs Lab 12/02/14 0643 12/04/14 0325  AST 32 28  ALT 21 19  ALKPHOS 85 72  BILITOT 0.6 1.2  PROT 6.6 6.3  ALBUMIN 3.4* 2.9*    Recent Labs Lab 12/04/14 0325  LIPASE 23  AMYLASE 275*   No results for input(s): AMMONIA in the last 168 hours. CBC:  Recent Labs Lab 12/02/14 0643  12/04/14 0325 12/05/14 0315  WBC 5.2  < > 27.7* 20.0*  NEUTROABS 2.9  --   --   --   HGB 13.3  < > 13.4 12.8*  HCT 38.4*  < > 39.3 37.5*  MCV 92.8  < > 92.5 92.6  PLT 160  < > 171 152  < > = values in this interval not displayed. Cardiac Enzymes:  Recent Labs Lab 12/02/14 0643  TROPONINI <0.03   BNP: Invalid input(s): POCBNP CBG:  Recent Labs Lab 12/03/14 1601  GLUCAP 123*     Micro Results: Recent Results (from the past 240 hour(s))  MRSA PCR Screening     Status: Abnormal   Collection Time: 12/02/14  2:28 PM  Result Value Ref Range Status   MRSA by PCR POSITIVE (A) NEGATIVE Final    Comment:        The GeneXpert MRSA Assay (FDA approved for NASAL specimens only), is one component of a comprehensive MRSA colonization surveillance program. It is not intended to diagnose MRSA infection nor to guide or monitor treatment for MRSA infections. RESULT CALLED TO, READ BACK BY AND VERIFIED WITH: Sharol Roussel RN 17:10 12/02/14 ([email or web address])   Culture, blood (routine x 2)     Status: None (Preliminary result)   Collection Time: 12/03/14  4:50 PM  Result Value Ref Range Status   Specimen Description BLOOD RIGHT HAND  Final   Special Requests BOTTLES DRAWN AEROBIC ONLY 10CC  Final   Culture   Final           BLOOD CULTURE RECEIVED NO GROWTH TO DATE CULTURE WILL BE HELD FOR 5 DAYS BEFORE ISSUING A FINAL NEGATIVE REPORT Performed at [facility]    Report Status PENDING  Incomplete  Culture, blood (routine x 2)     Status: None (Preliminary result)   Collection Time:  12/03/14  5:00 PM  Result Value Ref Range Status   Specimen Description BLOOD LEFT HAND  Final   Special Requests BOTTLES DRAWN AEROBIC ONLY 7CC  Final   Culture   Final           BLOOD CULTURE RECEIVED NO GROWTH TO DATE CULTURE WILL BE HELD FOR 5 DAYS BEFORE ISSUING A FINAL NEGATIVE REPORT Performed at [facility]    Report Status PENDING  Incomplete  Urine culture     Status: None (Preliminary result)   Collection Time: 12/03/14  5:19 PM  Result Value Ref Range Status   Specimen Description URINE, CLEAN CATCH  Final   Special Requests NONE  Final   Colony Count PENDING  Incomplete   Culture   Final    Culture reincubated for better growth Performed at [facility]    Report Status PENDING  Incomplete  Studies/Results: Ct Head Wo Contrast  12/02/2014   CLINICAL DATA:  Confusion, history of seizures and cerebral palsy  EXAM: CT HEAD WITHOUT CONTRAST  TECHNIQUE: Contiguous axial images were obtained from the base of the skull through the vertex without intravenous contrast.  COMPARISON:  07/05/2013  FINDINGS: The bony calvarium is intact. Diffuse basal ganglia calcifications are again identified. Mild atrophic changes and chronic white matter ischemic change is seen. No findings to suggest acute hemorrhage, acute infarction or space-occupying mass lesion are noted. Calcification in the pons and left frontal lobe are again seen and stable. No acute abnormality is noted.  IMPRESSION: Chronic atrophic and ischemic changes.  No acute abnormality noted.   Electronically Signed   By: Alcide Clever M.D.   On: 12/02/2014 11:38   Dg Chest Port 1 View  12/03/2014   CLINICAL DATA:  Shortness of breath.  EXAM: PORTABLE CHEST - 1 VIEW  COMPARISON:  12/02/2014  FINDINGS: Shallow inspiration. Borderline heart size with normal pulmonary vascularity. There is a small right pleural effusion with basilar infiltration or atelectasis. Appearance may indicate pneumonia. Left lung appears clear  and expanded. Volume loss in the right lung. Degenerative changes in the spine and shoulders. No pneumothorax.  IMPRESSION: Infiltration or atelectasis in the right lung base with right pleural effusion may indicate pneumonia.   Electronically Signed   By: Burman Nieves M.D.   On: 12/03/2014 17:36   Dg Chest Port 1 View  12/02/2014   CLINICAL DATA:  Altered mental status  EXAM: PORTABLE CHEST - 1 VIEW  COMPARISON:  November 05, 2014.  FINDINGS: There is mild atelectasis in the right base. Lungs elsewhere clear. Heart is upper normal in size with pulmonary vascularity within normal limits. No adenopathy. There is a hiatal hernia present. No bone lesions.  IMPRESSION: Atelectasis right base. Lungs otherwise clear. Hiatal hernia present.   Electronically Signed   By: Bretta Bang III M.D.   On: 12/02/2014 06:58   Dg Chest Port 1 View  11/05/2014   CLINICAL DATA:  Fever.  History of seizure.  EXAM: PORTABLE CHEST - 1 VIEW  COMPARISON:  PA and lateral chest 06/17/2014.  FINDINGS: Airspace disease is seen the lung bases bilaterally and in the right mid lung zone. Mild elevation of the right hemidiaphragm relative to the left is unchanged. Heart size is upper normal.  IMPRESSION: Right worse than left airspace disease most worrisome for pneumonia.   Electronically Signed   By: Drusilla Kanner M.D.   On: 11/05/2014 15:55   Dg Abd Portable 1v  12/04/2014   CLINICAL DATA:  Vomiting.  EXAM: PORTABLE ABDOMEN - 1 VIEW  COMPARISON:  Abdominal radiographs 07/12/2012  FINDINGS: No dilated bowel loops to suggest obstruction. There is generalized paucity of intra-abdominal bowel gas. Stool is seen in the rectum. No radiopaque calculi. Right basilar consolidation is seen. Broad-based scoliotic curvature of the spine.  IMPRESSION: No evidence of bowel obstruction with relative paucity of bowel gas in the abdomen. Stool distends the rectum.   Electronically Signed   By: Rubye Oaks M.D.   On: 12/04/2014 04:40     Medications: Scheduled Meds: . antiseptic oral rinse  7 mL Mouth Rinse q12n4p  . chlorhexidine  15 mL Mouth Rinse BID  . Chlorhexidine Gluconate Cloth  6 each Topical Q0600  . cloNIDine  0.2 mg Oral BID  . cycloSPORINE  1 drop Both Eyes BID  . dutasteride  0.5 mg Oral QPC supper  . enoxaparin (LOVENOX) injection  40 mg Subcutaneous Q24H  . escitalopram  20 mg Oral Q breakfast  . lacosamide (VIMPAT) IV  150 mg Intravenous Q12H  . levalbuterol  0.63 mg Nebulization TID  . metoprolol  5 mg Intravenous 3 times per day  . multivitamin with minerals   Oral Q breakfast  . mupirocin ointment  1 application Nasal BID  . pantoprazole (PROTONIX) IV  40 mg Intravenous Q12H  . piperacillin-tazobactam (ZOSYN)  IV  3.375 g Intravenous Q8H  . prazosin  2 mg Oral QHS  . tamsulosin  0.4 mg Oral QPC supper  . vancomycin  750 mg Intravenous Q24H   Time spent 25 minutes   LOS: 1 day   RAI,RIPUDEEP M.D. [facility] 12/05/2014, 11:45 AM Pager: [phone]  If 7PM-7AM, please contact night-coverage [email or web address] Password [email or web address]

## 2014-12-05 NOTE — Progress Notes (Signed)
Routine EEG completed, results pending. 

## 2014-12-06 LAB — CBC
HEMATOCRIT: 34.2 % — AB (ref 39.0–52.0)
Hemoglobin: 11.6 g/dL — ABNORMAL LOW (ref 13.0–17.0)
MCH: 31.4 pg (ref 26.0–34.0)
MCHC: 33.9 g/dL (ref 30.0–36.0)
MCV: 92.4 fL (ref 78.0–100.0)
PLATELETS: 150 10*3/uL (ref 150–400)
RBC: 3.7 MIL/uL — ABNORMAL LOW (ref 4.22–5.81)
RDW: 14.2 % (ref 11.5–15.5)
WBC: 11.3 10*3/uL — AB (ref 4.0–10.5)

## 2014-12-06 LAB — BASIC METABOLIC PANEL
Anion gap: 12 (ref 5–15)
BUN: 14 mg/dL (ref 6–23)
CALCIUM: 8.9 mg/dL (ref 8.4–10.5)
CO2: 22 mmol/L (ref 19–32)
Chloride: 106 mmol/L (ref 96–112)
Creatinine, Ser: 1.06 mg/dL (ref 0.50–1.35)
GFR calc Af Amer: 84 mL/min — ABNORMAL LOW (ref >90)
GFR calc non Af Amer: 72 mL/min — ABNORMAL LOW (ref >90)
GLUCOSE: 125 mg/dL — AB (ref 70–99)
Potassium: 3 mmol/L — ABNORMAL LOW (ref 3.5–5.1)
Sodium: 140 mmol/L (ref 135–145)

## 2014-12-06 LAB — GLUCOSE, CAPILLARY: Glucose-Capillary: 107 mg/dL — ABNORMAL HIGH (ref 70–99)

## 2014-12-06 MED ORDER — POTASSIUM CHLORIDE 10 MEQ/100ML IV SOLN
10.0000 meq | INTRAVENOUS | Status: AC
  Administered 2014-12-06 (×4): 10 meq via INTRAVENOUS
  Filled 2014-12-06 (×3): qty 100

## 2014-12-06 NOTE — Progress Notes (Signed)
Patient transferred from [facility] to 6E.  Written and oral handoff report provided to Genelle BalVanessa Crawford, LCSW for follow-up. [facility] when medically stable per MD.  This CSW will sign off.  Lorri Frederickonna T. Jaci LazierCrowder, KentuckyLCSW [phone]

## 2014-12-06 NOTE — Progress Notes (Signed)
Patient ID: Colin Bradley  male  JXB:[id number]    DOB: 1951-05-09    DOA: 12/02/2014  PCP: Julian Hy, MD   Brief history of present illness 64 year old male resident of [facility] with past medical hx cerebral palsy with mental retardation, developmental delay, seizure disorder, hypertension, history of GI bleed with recent hospitalization for altered mental status secondary to healthcare associated pneumonia and dehydration sent to the ED this morning for altered mental status since last evening. At baseline patient is well oriented and very talkative. He has been wheelchair-bound for several years. He does not have any bowel or urinary incontinence.  Over the last 3 days he was having mental status changes prior to admission, he was noticed to be alert but minimally talkative.Nursing home staff had difficulty giving him his medications. On the morning of admission, he was difficult to arouse and not talking so was sent to the ED. Patient was started on baclofen since 2/23 as a muscle relaxant for his legs as they were stiff and patient had difficulty bending his knees on the wheelchair.  Assessment/Plan: Principal Problem:   Acute encephalopathy: Currently alert and awake, close to baseline,  likely due to initiation of baclofen, also on multiple different medications including Vimpat, antidepressant and BZD and UTI - Head CT showed chronic atrophic and ischemic changes noted acute abnormality - TSH 2.1 - Continue seizure precautions, baclofen discontinued - PT evaluation recommended snf, had notable myoclonic jerk type of months during the PT evaluation - EEG showed: abnormal with moderate generalized nonspecific continuous slowing of cerebral activity consistent with history of cerebral palsy and mental delay, no evidence of epileptic activity was demonstrated.  Active Problems:  SIRS secondary to Aspiration pneumonia- improving - Pro calcitonin 6.19 - Chest  x-ray showed infiltration or atelectasis in the right lung base with a right pleural effusion may indicate pneumonia - Continue IV Zosyn.  - Swallow evaluation -> placed on dysphagia 2 diet - Blood cultures negative so far, narrow antibiotics down to IV Zosyn, and DC'd vancomycin   Urinary tract infection - Continue IV Zosyn, follow urine culture and sensitivities, urine culture preliminary showing Escherichia coli  Hypertension and tachycardia - BP better after starting oral meds    Depression, cerebral palsy and mental retardation - Per the nursing staff at the facility patient is well oriented and talkative  Hypothermia - Resolved likely due to medication  Seizure disorder Continue IV Vimpat, Ativan as needed Continue seizure precautions  EEG done  Hypokalemia Replaced  History of GI bleed Continue PPI   DVT Prophylaxis:Lovenox  Code Status:Full code  Family Communication: Discussed in detail with patient's father, Mr. HYKEEM OJEDA on phone  Disposition: Transfer to telemetry floor  Consultants: none Procedures: CT head  EEG  Antibiotics:  None  Subjective: Patient seen and examined, alert and awake, no acute issues overnight, afebrile  Objective: Weight change:   Intake/Output Summary (Last 24 hours) at 12/06/14 1032 Last data filed at 12/06/14 0912  Gross per 24 hour  Intake 1402.5 ml  Output   1175 ml  Net  227.5 ml   Blood pressure 131/74, pulse 105, temperature 98.8 F (37.1 C), temperature source Axillary, resp. rate 19, weight 52.2 kg (115 lb 1.3 oz), SpO2 97 %.  Physical Exam: General: Alert and awake, NAD, appropriately answering to most questions, close to his baseline mental status CVS: S1-S2 clear Chest: Decreased breath sounds at the bases otherwise fairly clear, no wheezing Abdomen: soft nontender, nondistended, normal  bowel sounds  Extremities: no cyanosis, clubbing, + trace edema   Lab Results: Basic Metabolic Panel:  Recent  Labs Lab 12/05/14 0315 12/06/14 0245  NA 135 140  K 3.1* 3.0*  CL 107 106  CO2 19 22  GLUCOSE 87 125*  BUN 12 14  CREATININE 1.08 1.06  CALCIUM 8.5 8.9   Liver Function Tests:  Recent Labs Lab 12/02/14 0643 12/04/14 0325  AST 32 28  ALT 21 19  ALKPHOS 85 72  BILITOT 0.6 1.2  PROT 6.6 6.3  ALBUMIN 3.4* 2.9*    Recent Labs Lab 12/04/14 0325  LIPASE 23  AMYLASE 275*   No results for input(s): AMMONIA in the last 168 hours. CBC:  Recent Labs Lab 12/02/14 0643  12/05/14 0315 12/06/14 0245  WBC 5.2  < > 20.0* 11.3*  NEUTROABS 2.9  --   --   --   HGB 13.3  < > 12.8* 11.6*  HCT 38.4*  < > 37.5* 34.2*  MCV 92.8  < > 92.6 92.4  PLT 160  < > 152 150  < > = values in this interval not displayed. Cardiac Enzymes:  Recent Labs Lab 12/02/14 0643  TROPONINI <0.03   BNP: Invalid input(s): POCBNP CBG:  Recent Labs Lab 12/03/14 1601  GLUCAP 123*     Micro Results: Recent Results (from the past 240 hour(s))  MRSA PCR Screening     Status: Abnormal   Collection Time: 12/02/14  2:28 PM  Result Value Ref Range Status   MRSA by PCR POSITIVE (A) NEGATIVE Final    Comment:        The GeneXpert MRSA Assay (FDA approved for NASAL specimens only), is one component of a comprehensive MRSA colonization surveillance program. It is not intended to diagnose MRSA infection nor to guide or monitor treatment for MRSA infections. RESULT CALLED TO, READ BACK BY AND VERIFIED WITH: Sharol RousselS. GLOSSON RN 17:10 12/02/14 ([email or web address])   Culture, blood (routine x 2)     Status: None (Preliminary result)   Collection Time: 12/03/14  4:50 PM  Result Value Ref Range Status   Specimen Description BLOOD RIGHT HAND  Final   Special Requests BOTTLES DRAWN AEROBIC ONLY 10CC  Final   Culture   Final           BLOOD CULTURE RECEIVED NO GROWTH TO DATE CULTURE WILL BE HELD FOR 5 DAYS BEFORE ISSUING A FINAL NEGATIVE REPORT Performed at [facility]    Report Status PENDING  Incomplete    Culture, blood (routine x 2)     Status: None (Preliminary result)   Collection Time: 12/03/14  5:00 PM  Result Value Ref Range Status   Specimen Description BLOOD LEFT HAND  Final   Special Requests BOTTLES DRAWN AEROBIC ONLY 7CC  Final   Culture   Final           BLOOD CULTURE RECEIVED NO GROWTH TO DATE CULTURE WILL BE HELD FOR 5 DAYS BEFORE ISSUING A FINAL NEGATIVE REPORT Performed at [facility]    Report Status PENDING  Incomplete  Urine culture     Status: None (Preliminary result)   Collection Time: 12/03/14  5:19 PM  Result Value Ref Range Status   Specimen Description URINE, CLEAN CATCH  Final   Special Requests NONE  Final   Colony Count 1900 Performed at [facility]   Final   Culture   Final    ESCHERICHIA COLI Culture reincubated for better growth Performed at  [facility]    Report Status PENDING  Incomplete    Studies/Results: Ct Head Wo Contrast  12/02/2014   CLINICAL DATA:  Confusion, history of seizures and cerebral palsy  EXAM: CT HEAD WITHOUT CONTRAST  TECHNIQUE: Contiguous axial images were obtained from the base of the skull through the vertex without intravenous contrast.  COMPARISON:  07/05/2013  FINDINGS: The bony calvarium is intact. Diffuse basal ganglia calcifications are again identified. Mild atrophic changes and chronic white matter ischemic change is seen. No findings to suggest acute hemorrhage, acute infarction or space-occupying mass lesion are noted. Calcification in the pons and left frontal lobe are again seen and stable. No acute abnormality is noted.  IMPRESSION: Chronic atrophic and ischemic changes.  No acute abnormality noted.   Electronically Signed   By: Alcide Clever M.D.   On: 12/02/2014 11:38   Dg Chest Port 1 View  12/03/2014   CLINICAL DATA:  Shortness of breath.  EXAM: PORTABLE CHEST - 1 VIEW  COMPARISON:  12/02/2014  FINDINGS: Shallow inspiration. Borderline heart size with normal pulmonary vascularity. There  is a small right pleural effusion with basilar infiltration or atelectasis. Appearance may indicate pneumonia. Left lung appears clear and expanded. Volume loss in the right lung. Degenerative changes in the spine and shoulders. No pneumothorax.  IMPRESSION: Infiltration or atelectasis in the right lung base with right pleural effusion may indicate pneumonia.   Electronically Signed   By: Burman Nieves M.D.   On: 12/03/2014 17:36   Dg Chest Port 1 View  12/02/2014   CLINICAL DATA:  Altered mental status  EXAM: PORTABLE CHEST - 1 VIEW  COMPARISON:  November 05, 2014.  FINDINGS: There is mild atelectasis in the right base. Lungs elsewhere clear. Heart is upper normal in size with pulmonary vascularity within normal limits. No adenopathy. There is a hiatal hernia present. No bone lesions.  IMPRESSION: Atelectasis right base. Lungs otherwise clear. Hiatal hernia present.   Electronically Signed   By: Bretta Bang III M.D.   On: 12/02/2014 06:58   Dg Abd Portable 1v  12/04/2014   CLINICAL DATA:  Vomiting.  EXAM: PORTABLE ABDOMEN - 1 VIEW  COMPARISON:  Abdominal radiographs 07/12/2012  FINDINGS: No dilated bowel loops to suggest obstruction. There is generalized paucity of intra-abdominal bowel gas. Stool is seen in the rectum. No radiopaque calculi. Right basilar consolidation is seen. Broad-based scoliotic curvature of the spine.  IMPRESSION: No evidence of bowel obstruction with relative paucity of bowel gas in the abdomen. Stool distends the rectum.   Electronically Signed   By: Rubye Oaks M.D.   On: 12/04/2014 04:40   Dg Swallowing Func-speech Pathology  12/06/2014   Riley Nearing Deblois, CCC-SLP     12/06/2014  9:45 AM  Objective Swallowing Evaluation:    Patient Details  Name: Colin Bradley MRN: [id number] Date of Birth: [birth year]  Today's Date: 12/05/2014 Time: SLP Start Time (ACUTE ONLY): 1315-SLP Stop Time (ACUTE  ONLY): 1330 SLP Time Calculation (min) (ACUTE ONLY): 15 min  Past Medical  History:  Past Medical History  Diagnosis Date  . Seizures   . Cerebral palsy   . Hypertension   . Mental retardation   . Depression   . Nephrolithiasis   . BPH (benign prostatic hyperplasia)   . GI bleed 2009    coffee ground emesis, erosive esophagitis on EGD by Dr Juanda Chance  . Esophageal stricture 2009    not dilated during the EGD, biopsy benign:  no Barretts  .  Depression with anxiety    Past Surgical History:  Past Surgical History  Procedure Laterality Date  . Basal cell carcinoma excision  2002    forehead  . Squamous cell carcinoma excision  2004    lip  . Bowen dz  2002    right cheek   . Esophagogastroduodenoscopy  06/14/2012    Procedure: ESOPHAGOGASTRODUODENOSCOPY (EGD);  Surgeon: Rachael Fee, MD;  Location: [facility] ENDOSCOPY;  Service: Endoscopy;   Laterality: N/A;  . Multiple extractions with alveoloplasty N/A 06/20/2014    Procedure: MULTIPLE EXTRACTIONS TEETH NUMBER TWO, TEN, TWELVE,  EIGHTEEN, TWENTY-ONE, TWENTY-TWO, TWENTY-NINE, THIRTY-TWO;   Surgeon: Georgia Lopes, DDS;  Location: [facility] OR;  Service: Oral  Surgery;  Laterality: N/A;   HPI:  HPI: 64 year old male resident of [facility] with past  medical hx cerebral palsy intellectual disabilty and  developmental delay, seizure disorder, hypertension, history of  GI bleed with recent hospitalization for altered mental status  secondary to healthcare associated pneumonia and dehydration  admitted for altered mental status since last evening.CT showed  chronic atrophic and ischemic changes noted acute abnormality.  Per MD note patient has had good appetite and taking his  medications. RN note of emesis this am. CXR infiltration or  atelectasis in the right lung base with right pleural effusion  may indicate pneumonia. BSE 11/07/14 did nto reveal indications of  aspiraiton across consistencies and recommended Dys 3 and nectar  thick liquids (SLP noted to "continue" nectar thick, assuming on  nectar prior to assessment; uncertain initiated by hospital  MD or  SNF?).  No Data Recorded  Assessment / Plan / Recommendation CHL IP CLINICAL IMPRESSIONS 12/05/2014  Dysphagia Diagnosis (None)  Clinical impression Pt presents with mild pharyngeal phase  dysphagia chactarized by delayed swallow initiation to the level  of the valleculae and reduced base of tongue retraction. Noted  vallecular and pyriform residue following swallow. SLP unable to  evaluate further; pt fatigued and orally holding bolluses. Did  not observe aspiration or penetration across consistencies,  however, pt is at moderate risk for aspiration due to poor mental  status, delayed swallow initiation and reduced sensation.  Recommend pt initiate dys 2/ nectar thin liquid diet if family is  willing to accept risk of aspiration. Recommend consult with  palliative care due to repeated hospitalizations and aspiration  risk. Speech will follow-up for diet tolerance and possible  upgrade.       CHL IP TREATMENT RECOMMENDATION 12/05/2014  Treatment Plan Recommendations Therapy as outlined in treatment  plan below     CHL IP DIET RECOMMENDATION 12/05/2014  Diet Recommendations Dysphagia 2 (Fine chop);Nectar-thick liquid  Liquid Administration via Cup;Straw  Medication Administration Whole meds with puree  Compensations Slow rate;Small sips/bites  Postural Changes and/or Swallow Maneuvers Seated upright 90  degrees     CHL IP OTHER RECOMMENDATIONS 12/05/2014  Recommended Consults Other (Comment)  Oral Care Recommendations Oral care BID  Other Recommendations Order thickener from pharmacy     CHL IP FOLLOW UP RECOMMENDATIONS 12/05/2014  Follow up Recommendations Other (comment)     CHL IP FREQUENCY AND DURATION 12/05/2014  Speech Therapy Frequency (ACUTE ONLY) min 2x/week  Treatment Duration 2 weeks     Pertinent Vitals/Pain NA    SLP Swallow Goals No flowsheet data found.  No flowsheet data found.    No flowsheet data found.   No flowsheet data found.    CHL IP PHARYNGEAL PHASE 12/05/2014  Pharyngeal Phase (None)   Pharyngeal -  Pudding Teaspoon (None)  Penetration/Aspiration details (pudding teaspoon) (None)  Pharyngeal - Pudding Cup (None)  Penetration/Aspiration details (pudding cup) (None)  Pharyngeal - Honey Teaspoon (None)  Penetration/Aspiration details (honey teaspoon) (None)  Pharyngeal - Honey Cup (None)  Penetration/Aspiration details (honey cup) (None)  Pharyngeal - Honey Syringe (None)  Penetration/Aspiration details (honey syringe) (None) Pharyngeal  - Nectar Teaspoon (None)  Penetration/Aspiration details (nectar teaspoon) (None)  Pharyngeal - Nectar Cup (None)  Penetration/Aspiration details (nectar cup) (None)  Pharyngeal - Nectar Straw (None)  Penetration/Aspiration details (nectar straw) (None)  Pharyngeal - Nectar Syringe (None)  Penetration/Aspiration details (nectar syringe) (None)  Pharyngeal - Ice Chips (None)  Penetration/Aspiration details (ice chips) (None)  Pharyngeal - Thin Teaspoon Delayed swallow initiation;Premature  spillage to valleculae;Reduced laryngeal elevation;Pharyngeal  residue - valleculae;Pharyngeal residue - pyriform  sinuses;Reduced tongue base retraction  Penetration/Aspiration details (thin teaspoon) (None)  Pharyngeal - Thin Cup Delayed swallow initiation;Premature  spillage to valleculae;Reduced laryngeal elevation;Pharyngeal  residue - valleculae;Pharyngeal residue - pyriform  sinuses;Reduced tongue base retraction;Premature spillage to  pyriform sinuses  Penetration/Aspiration details (thin cup) (None)  Pharyngeal - Thin Straw Delayed swallow initiation;Premature  spillage to valleculae;Reduced laryngeal elevation;Pharyngeal  residue - valleculae;Pharyngeal residue - pyriform  sinuses;Reduced tongue base retraction;Premature spillage to  pyriform sinuses  Penetration/Aspiration details (thin straw) (None)  Pharyngeal - Thin Syringe (None)  Penetration/Aspiration details (thin syringe') (None)  Pharyngeal - Puree Delayed swallow initiation;Premature spillage  to  valleculae;Reduced laryngeal elevation;Pharyngeal residue -  valleculae;Pharyngeal residue - pyriform sinuses;Reduced tongue  base retraction  Penetration/Aspiration details (puree) (None)  Pharyngeal - Mechanical Soft (None)  Penetration/Aspiration details (mechanical soft) (None)  Pharyngeal - Regular Delayed swallow initiation;Premature  spillage to valleculae;Reduced laryngeal elevation;Pharyngeal  residue - valleculae;Pharyngeal residue - pyriform  sinuses;Reduced tongue base retraction  Penetration/Aspiration details (regular) (None)  Pharyngeal - Multi-consistency (None)  Penetration/Aspiration details (multi-consistency) (None)  Pharyngeal - Pill (None)  Penetration/Aspiration details (pill) (None)  Pharyngeal Comment (None)     No flowsheet data found.  No flowsheet data found.  Leonia Corona, SLP student  Supervised and reviewed by Harlon Ditty MA CCC-SLP        DeBlois, Riley Nearing 12/05/2014, 2:48 PM     Medications: Scheduled Meds: . antiseptic oral rinse  7 mL Mouth Rinse q12n4p  . chlorhexidine  15 mL Mouth Rinse BID  . Chlorhexidine Gluconate Cloth  6 each Topical Q0600  . cloNIDine  0.2 mg Oral BID  . cycloSPORINE  1 drop Both Eyes BID  . dutasteride  0.5 mg Oral QPC supper  . enoxaparin (LOVENOX) injection  40 mg Subcutaneous Q24H  . escitalopram  20 mg Oral Q breakfast  . lacosamide (VIMPAT) IV  150 mg Intravenous Q12H  . levalbuterol  0.63 mg Nebulization TID  . lisinopril  30 mg Oral Q breakfast  . metoprolol  5 mg Intravenous 3 times per day  . multivitamin with minerals   Oral Q breakfast  . mupirocin ointment  1 application Nasal BID  . pantoprazole (PROTONIX) IV  40 mg Intravenous Q12H  . piperacillin-tazobactam (ZOSYN)  IV  3.375 g Intravenous Q8H  . prazosin  2 mg Oral QHS  . tamsulosin  0.4 mg Oral QPC supper  . vancomycin  750 mg Intravenous Q24H   Time spent 25 minutes   LOS: 2 days   RAI,RIPUDEEP M.D. [facility] 12/06/2014, 10:32  AM Pager: [phone]  If 7PM-7AM, please contact night-coverage [email or web address] Password [email or web address]

## 2014-12-06 NOTE — Progress Notes (Signed)
Physical Therapy Treatment Patient Details Name: Colin Bradley MRN: [id number] DOB: April 05, 1951 Today's Date: 12/06/2014    History of Present Illness 64 year old male resident of [facility] with past medical hx cerebral palsy with mental retardation, developmental delay, seizure disorder, hypertension, history of GI bleed with recent hospitalization for altered mental status secondary to healthcare associated pneumonia and dehydration sent to the ED for altered mental status.    PT Comments    Pt more alert and interactive.  Follow Up Recommendations  Other (comment) (return to ALF facility)     Equipment Recommendations  None recommended by PT    Recommendations for Other Services       Precautions / Restrictions Precautions Precautions: Fall    Mobility  Bed Mobility Overal bed mobility: Needs Assistance Bed Mobility: Supine to Sit;Sit to Supine     Supine to sit: Total assist Sit to supine: Total assist   General bed mobility comments: Assist for all aspects. Pt with lower extremities in extension.  Transfers                    Ambulation/Gait                 Stairs            Wheelchair Mobility    Modified Rankin (Stroke Patients Only)       Balance Overall balance assessment: Needs assistance Sitting-balance support: No upper extremity supported;Feet unsupported Sitting balance-Leahy Scale: Poor Sitting balance - Comments: Mod A to sit EOB x 10 minutes. Some knee flexion with gravity.  Postural control: Posterior lean;Left lateral lean                          Cognition Arousal/Alertness: Awake/alert Behavior During Therapy: Flat affect Overall Cognitive Status: No family/caregiver present to determine baseline cognitive functioning                 General Comments: Very difficult to understand. Talking about ice cream.    Exercises      General Comments        Pertinent Vitals/Pain Pain  Assessment: Faces Faces Pain Scale: No hurt    Home Living                      Prior Function            PT Goals (current goals can now be found in the care plan section) Progress towards PT goals: Progressing toward goals    Frequency  Min 2X/week    PT Plan Current plan remains appropriate    Co-evaluation             End of Session   Activity Tolerance: Patient tolerated treatment well Patient left: in bed;with call bell/phone within reach;with bed alarm set     Time: 1143-1200 PT Time Calculation (min) (ACUTE ONLY): 17 min  Charges:  $Therapeutic Activity: 8-22 mins                    G Codes:      Colin Bradley 12/06/2014, 2:17 PM  [facility] PT [phone]

## 2014-12-06 NOTE — Progress Notes (Signed)
Transfer Note   Arrival Method: Bed w/ nursing staff  6 East Orientation: Patient has been orientated to the room, unit and staff.  Family: No family at bedside   Orders have been reviewed and implemented. Will continue to monitor the patient. Call light has been placed within reach and bed alarm has been activated.   Riesa PopeJasmine Areeb Corron BSN, RN Phone number: [phone]

## 2014-12-06 NOTE — Progress Notes (Signed)
TRansferred to 6 east room 21 by bed, report given to R,.belongings with pt.

## 2014-12-07 ENCOUNTER — Emergency Department (HOSPITAL_COMMUNITY)
Admission: EM | Admit: 2014-12-07 | Discharge: 2014-12-07 | Disposition: A | Discharge status: Home / Self Care | Payer: Medicare Other | Attending: Emergency Medicine | Admitting: Emergency Medicine

## 2014-12-07 ENCOUNTER — Encounter (HOSPITAL_COMMUNITY): Payer: Self-pay | Admitting: Emergency Medicine

## 2014-12-07 DIAGNOSIS — I10 Essential (primary) hypertension: Secondary | ICD-10-CM | POA: Insufficient documentation

## 2014-12-07 DIAGNOSIS — Z9981 Dependence on supplemental oxygen: Secondary | ICD-10-CM | POA: Insufficient documentation

## 2014-12-07 DIAGNOSIS — J159 Unspecified bacterial pneumonia: Secondary | ICD-10-CM | POA: Diagnosis not present

## 2014-12-07 DIAGNOSIS — F329 Major depressive disorder, single episode, unspecified: Secondary | ICD-10-CM | POA: Insufficient documentation

## 2014-12-07 DIAGNOSIS — F419 Anxiety disorder, unspecified: Secondary | ICD-10-CM | POA: Insufficient documentation

## 2014-12-07 DIAGNOSIS — Z79899 Other long term (current) drug therapy: Secondary | ICD-10-CM | POA: Insufficient documentation

## 2014-12-07 DIAGNOSIS — F79 Unspecified intellectual disabilities: Secondary | ICD-10-CM | POA: Insufficient documentation

## 2014-12-07 DIAGNOSIS — Z8669 Personal history of other diseases of the nervous system and sense organs: Secondary | ICD-10-CM | POA: Insufficient documentation

## 2014-12-07 DIAGNOSIS — Z87438 Personal history of other diseases of male genital organs: Secondary | ICD-10-CM | POA: Insufficient documentation

## 2014-12-07 DIAGNOSIS — Z8719 Personal history of other diseases of the digestive system: Secondary | ICD-10-CM | POA: Insufficient documentation

## 2014-12-07 DIAGNOSIS — J189 Pneumonia, unspecified organism: Secondary | ICD-10-CM

## 2014-12-07 DIAGNOSIS — Z87442 Personal history of urinary calculi: Secondary | ICD-10-CM | POA: Insufficient documentation

## 2014-12-07 LAB — URINE CULTURE

## 2014-12-07 LAB — BASIC METABOLIC PANEL
Anion gap: 12 (ref 5–15)
BUN: 12 mg/dL (ref 6–23)
CO2: 21 mmol/L (ref 19–32)
CREATININE: 0.9 mg/dL (ref 0.50–1.35)
Calcium: 8.5 mg/dL (ref 8.4–10.5)
Chloride: 105 mmol/L (ref 96–112)
GFR, EST NON AFRICAN AMERICAN: 88 mL/min — AB (ref >90)
GLUCOSE: 93 mg/dL (ref 70–99)
Potassium: 3.1 mmol/L — ABNORMAL LOW (ref 3.5–5.1)
Sodium: 138 mmol/L (ref 135–145)

## 2014-12-07 LAB — CBC
HEMATOCRIT: 35 % — AB (ref 39.0–52.0)
Hemoglobin: 11.9 g/dL — ABNORMAL LOW (ref 13.0–17.0)
MCH: 31.6 pg (ref 26.0–34.0)
MCHC: 34 g/dL (ref 30.0–36.0)
MCV: 92.8 fL (ref 78.0–100.0)
Platelets: 170 10*3/uL (ref 150–400)
RBC: 3.77 MIL/uL — ABNORMAL LOW (ref 4.22–5.81)
RDW: 14 % (ref 11.5–15.5)
WBC: 7.9 10*3/uL (ref 4.0–10.5)

## 2014-12-07 LAB — PROCALCITONIN: PROCALCITONIN: 1.79 ng/mL

## 2014-12-07 MED ORDER — POTASSIUM CHLORIDE CRYS ER 20 MEQ PO TBCR
40.0000 meq | EXTENDED_RELEASE_TABLET | Freq: Once | ORAL | Status: AC
  Administered 2014-12-07: 40 meq via ORAL
  Filled 2014-12-07: qty 2

## 2014-12-07 MED ORDER — SACCHAROMYCES BOULARDII 250 MG PO CAPS: 250.0000 mg | ORAL_CAPSULE | Freq: Two times a day (BID) | ORAL | Status: DC

## 2014-12-07 MED ORDER — AMOXICILLIN-POT CLAVULANATE 875-125 MG PO TABS: 1.0000 | ORAL_TABLET | Freq: Two times a day (BID) | ORAL | Status: DC

## 2014-12-07 MED ORDER — LACOSAMIDE 50 MG PO TABS
150.0000 mg | ORAL_TABLET | Freq: Two times a day (BID) | ORAL | Status: DC
  Administered 2014-12-07: 150 mg via ORAL
  Filled 2014-12-07: qty 3

## 2014-12-07 MED ORDER — LORAZEPAM 1 MG PO TABS: 1.0000 mg | ORAL_TABLET | Freq: Three times a day (TID) | ORAL | Status: DC | PRN

## 2014-12-07 MED ORDER — FOSFOMYCIN TROMETHAMINE 3 G PO PACK
3.0000 g | PACK | ORAL | Status: DC
  Administered 2014-12-07: 3 g via ORAL
  Filled 2014-12-07: qty 3

## 2014-12-07 MED ORDER — METOPROLOL TARTRATE 50 MG PO TABS: 50.0000 mg | ORAL_TABLET | Freq: Two times a day (BID) | ORAL | Status: AC

## 2014-12-07 MED ORDER — PANTOPRAZOLE SODIUM 40 MG PO TBEC
40.0000 mg | DELAYED_RELEASE_TABLET | Freq: Two times a day (BID) | ORAL | Status: DC
  Administered 2014-12-07: 40 mg via ORAL
  Filled 2014-12-07: qty 1

## 2014-12-07 MED ORDER — AMOXICILLIN-POT CLAVULANATE 875-125 MG PO TABS
1.0000 | ORAL_TABLET | Freq: Two times a day (BID) | ORAL | Status: DC
  Administered 2014-12-07: 1 via ORAL
  Filled 2014-12-07 (×2): qty 1

## 2014-12-07 MED ORDER — SACCHAROMYCES BOULARDII 250 MG PO CAPS
250.0000 mg | ORAL_CAPSULE | Freq: Two times a day (BID) | ORAL | Status: DC
  Administered 2014-12-07: 250 mg via ORAL
  Filled 2014-12-07 (×2): qty 1

## 2014-12-07 MED ORDER — METOPROLOL TARTRATE 50 MG PO TABS
50.0000 mg | ORAL_TABLET | Freq: Two times a day (BID) | ORAL | Status: DC
  Administered 2014-12-07: 50 mg via ORAL
  Filled 2014-12-07 (×2): qty 1

## 2014-12-07 MED ORDER — FOSFOMYCIN TROMETHAMINE 3 G PO PACK: 3.0000 g | PACK | Freq: Once | ORAL | Status: DC

## 2014-12-07 NOTE — Care Management CHF Note (Signed)
ED CM at bedside with Tammy SoursGreg RN  O2 saturations noted 99-100% on RA continuously. Dr. Jacky KindleAronson Medical Advisor notified, [facility]. Contacted the charge nurse Tammy will to accept patient tonight. PTAR called patient awaiting transport.

## 2014-12-07 NOTE — ED Notes (Signed)
[facility] patient on 4[facility] [place] and facility does not have an order for oxygen.  PTAR unable to leave patient with facility because pulse oximetry on room air is 91%-94% and protocol has to be above 95%.  Case manager aware and working on placement for patient.

## 2014-12-07 NOTE — Care Management Note (Signed)
CARE MANAGEMENT NOTE 12/07/2014  Patient:  Colin Bradley,Colin Bradley   Account Number:  [id number]  Date Initiated:  12/05/2014  Documentation initiated by:  Junius CreamerWELL,DEBBIE  Subjective/Objective Assessment:   adm Bradley encephalopathy     Action/Plan:   from nsg facility   Anticipated DC Date:  12/07/2014   Anticipated DC Plan:  SKILLED NURSING FACILITY  In-house referral  Clinical Social Worker         Choice offered to / List presented to:             Status of service:  Completed, signed off Medicare Important Message given?  YES (If response is "NO", the following Medicare IM given date fields will be blank) Date Medicare IM given:  12/07/2014 Medicare IM given by:  Brion Hedges Date Additional Medicare IM given:   Additional Medicare IM given by:    Discharge Disposition:  ASSISTED LIVING  Per UR Regulation:  Reviewed for med. necessity/level of care/duration of stay  If discussed at Long Length of Stay Meetings, dates discussed:    Comments:

## 2014-12-07 NOTE — Progress Notes (Signed)
Patient 02 saturations at 99-100 percent in the ED.  [facility] informed and will [facility] back this pm.  FL2 updated to reflect that pt has no 02 needs and [facility] called for transportation.

## 2014-12-07 NOTE — Clinical Social Work Note (Signed)
Patient medically stable to [facility]. Call made to staff person Tammy at ALF and d/c information transmitted to facility for review and approval. Call made to patient's guardianship social worker Hoover BrunetteRoxane Brand [phone]) with [facility] and message left regarding patient returning to facility today. Mr. Sharyl NimrodMeredith will be transported to assisted living by ambulance.   Genelle BalVanessa Curtis Cain, MSW, LCSW Licensed Clinical Social Worker Clinical Social Work Department [facility] [phone]

## 2014-12-07 NOTE — Progress Notes (Signed)
[name] [facility] per MD order.   Patients skin is clean, dry and intact, no evidence of skin break down. IV site discontinued and catheter remains intact. Site without signs and symptoms of complications. Dressing and pressure applied.  Patient escorted to ambulance in a stretcher,  no distress noted upon discharge.  Gilman Schmidtembrina, Jim Philemon J 12/07/2014 4:32 PM

## 2014-12-07 NOTE — Progress Notes (Signed)
Speech Language Pathology Treatment: Dysphagia  Patient Details Name: Colin Bradley MRN: [id number] DOB: 02-22-1951 Today's Date: 12/07/2014 Time: 1158-1209 SLP Time Calculation (min) (ACUTE ONLY): 11 min  Assessment / Plan / Recommendation Clinical Impression  Pt alert and requesting something to drink. SLP administered cup sips of nectar thick liquids and provided Mod cueing due to oral holding. Immediate cough reflex occurred x1. Would continue with current diet textures given continued aspiration risk.   HPI HPI: 64 year old male resident of [facility] with past medical hx cerebral palsy intellectual disabilty and developmental delay, seizure disorder, hypertension, history of GI bleed with recent hospitalization for altered mental status secondary to healthcare associated pneumonia and dehydration admitted for altered mental status since last evening.CT showed chronic atrophic and ischemic changes noted acute abnormality. Per MD note patient has had good appetite and taking his medications. RN note of emesis this am. CXR infiltration or atelectasis in the right lung base with right pleural effusion may indicate pneumonia. BSE 11/07/14 did nto reveal indications of aspiraiton across consistencies and recommended Dys 3 and nectar thick liquids (SLP noted to "continue" nectar thick, assuming on nectar prior to assessment; uncertain initiated by hospital MD or SNF?).   Pertinent Vitals Pain Assessment: No/denies pain  SLP Plan  Continue with current plan of care    Recommendations Diet recommendations: Dysphagia 2 (fine chop);Nectar-thick liquid Liquids provided via: Cup;Straw Medication Administration: Whole meds with puree Supervision: Full supervision/cueing for compensatory strategies Compensations: Slow rate;Small sips/bites Postural Changes and/or Swallow Maneuvers: Seated upright 90 degrees       Oral Care Recommendations: Oral care BID Follow up Recommendations: Skilled  Nursing facility Plan: Continue with current plan of care     Colin Bradley, M.A. CCC-SLP [phone]  Colin Hamaiewonsky, Colin Bradley 12/07/2014, 12:18 PM

## 2014-12-07 NOTE — Progress Notes (Signed)
Report called to [facility] to [place], [place]. Gilman Schmidtembrina, Shauntia Levengood J

## 2014-12-07 NOTE — Discharge Summary (Addendum)
Physician Discharge Summary  Patient ID: AITHAN FARRELLY MRN: [id number] DOB/AGE: 65-Aug-1952 65 y.o.  Admit date: 12/02/2014 Discharge date: 12/07/2014  Primary Care Physician:  Julian Hy, MD  Discharge Diagnoses:    . Acute encephalopathy  SIRS secondary to aspiration pneumonia   Urinary tract infection   Hypertension   Seizure disorder   Hypokalemia replaced  . Infantile cerebral palsy . Depression . Hypothermia  Consults:  Speech pathology Physical therapy   Recommendations for Outpatient Follow-up:  Aspiration precautions  Per Swallow evaluation, Discharge diet: Dysphagia 2, nectar thick diet, with full assistance  Please note baclofen and elavil was discontinued.   Please check CBC, BMET next week.  Please continue Augmentin for aspiration pneumonia 4 more days  Patient was given 1 dose of fosfomycin prior to discharge for UTI and repeat another dose in 2 days.      Allergies:  No Known Allergies   Discharge Medications:   Medication List    STOP taking these medications        amitriptyline 25 MG tablet  Commonly known as:  ELAVIL     baclofen 10 MG tablet  Commonly known as:  LIORESAL     dextromethorphan 30 MG/5ML liquid  Commonly known as:  DELSYM     dutasteride 0.5 MG capsule  Commonly known as:  AVODART     pantoprazole 40 MG tablet  Commonly known as:  PROTONIX     tamsulosin 0.4 MG Caps capsule  Commonly known as:  FLOMAX      TAKE these medications        albuterol (2.5 MG/3ML) 0.083% nebulizer solution  Commonly known as:  PROVENTIL  Take 3 mLs (2.5 mg total) by nebulization every 6 (six) hours as needed for wheezing or shortness of breath.     amoxicillin-clavulanate 875-125 MG per tablet  Commonly known as:  AUGMENTIN  Take 1 tablet by mouth 2 (two) times daily. X 4 more days     cloNIDine 0.2 MG tablet  Commonly known as:  CATAPRES  Take 1 tablet (0.2 mg total) by mouth 2 (two) times daily.     cycloSPORINE  0.05 % ophthalmic emulsion  Commonly known as:  RESTASIS  Place 1 drop into both eyes 2 (two) times daily.     DAILY VITE PO  Take 1 tablet by mouth daily with breakfast.     escitalopram 20 MG tablet  Commonly known as:  LEXAPRO  Take 20 mg by mouth daily with breakfast.     food thickener Powd  Commonly known as:  THICK IT  Take 1 g by mouth 2 (two) times daily.     fosfomycin 3 G Pack  Commonly known as:  MONUROL  Take 3 g by mouth once. Please give 1 tab on 12/09/2014  Start taking on:  12/09/2014     guaiFENesin 100 MG/5ML Soln  Commonly known as:  ROBITUSSIN  Take 10 mLs by mouth 3 (three) times daily. For 10 days     JALYN 0.5-0.4 MG Caps  Generic drug:  Dutasteride-Tamsulosin HCl  Take 1 tablet by mouth daily with breakfast.     lisinopril 30 MG tablet  Commonly known as:  PRINIVIL,ZESTRIL  Take 1 tablet (30 mg total) by mouth daily with breakfast.     LORazepam 1 MG tablet  Commonly known as:  ATIVAN  Take 1 tablet (1 mg total) by mouth every 8 (eight) hours as needed for anxiety.     metoprolol 50 MG  tablet  Commonly known as:  LOPRESSOR  Take 1 tablet (50 mg total) by mouth 2 (two) times daily.     ondansetron 4 MG tablet  Commonly known as:  ZOFRAN  Take 4 mg by mouth 3 (three) times daily as needed for nausea or vomiting.     Potassium Chloride ER 20 MEQ Tbcr  Take 20 mEq by mouth daily.     prazosin 2 MG capsule  Commonly known as:  MINIPRESS  Take 2 mg by mouth at bedtime.     promethazine 12.5 MG tablet  Commonly known as:  PHENERGAN  Take 12.5 mg by mouth every 12 (twelve) hours as needed for nausea or vomiting.     ranitidine 150 MG tablet  Commonly known as:  ZANTAC  Take 150 mg by mouth 2 (two) times daily.     RESOURCE THICKENUP CLEAR Powd  2 times a day with meals     saccharomyces boulardii 250 MG capsule  Commonly known as:  FLORASTOR  Take 1 capsule (250 mg total) by mouth 2 (two) times daily. While on antibiotics     sucralfate 1  GM/10ML suspension  Commonly known as:  CARAFATE  Take 1 g by mouth 2 (two) times daily.     VIMPAT 150 MG Tabs  Generic drug:  Lacosamide  Take 150 mg by mouth 2 (two) times daily.         Brief H and P: For complete details please refer to admission H and P, but in brief 64 year old male resident of [facility] with past medical hx cerebral palsy with mental retardation, developmental delay, seizure disorder, hypertension, history of GI bleed with recent hospitalization for altered mental status secondary to healthcare associated pneumonia and dehydration sent to the ED this morning for altered mental status since last evening. At baseline patient is well oriented and very talkative. He has been wheelchair-bound for several years. He does not have any bowel or urinary incontinence.  Over the last 3 days he was having mental status changes prior to admission, he was noticed to be alert but minimally talkative.Nursing home staff had difficulty giving him his medications. On the morning of admission, he was difficult to arouse and not talking so was sent to the ED. Patient was started on baclofen since 2/23 as a muscle relaxant for his legs as they were stiff and patient had difficulty bending his knees on the wheelchair.  Hospital Course:   Acute encephalopathy: Currently alert and awake, close to baseline, likely due to initiation of baclofen, also on multiple different medications including Vimpat, antidepressant and BZD and UTI - Head CT showed chronic atrophic and ischemic changes noted acute abnormality - TSH 2.1 - Baclofen was discontinued. PT evaluation recommended back to ALF. He had notable myoclonic jerk type movements during PT evaluation on 2/26. EEG was done which showed abnormal with moderate generalized nonspecific continuous slowing of cerebral activity consistent with history of cerebral palsy and mental delay, no evidence of epileptic activity was demonstrated.  Patient will continue Vimpat.  SIRS secondary to Aspiration pneumonia- improving On 2/27, patient was noted to be in sudden respiratory distress, heart rate 132, temperature 100.2, likely from aspiration. Pro calcitonin 6.19.  Chest x-ray showed infiltration or atelectasis in the right lung base with a right pleural effusion may indicate pneumonia. Patient was transferred to stepdown unit, placed on IV vancomycin and Zosyn. Antibiotics were narrowed down to IV Zosyn after significant improvement. Blood cultures have remained negative so  far. Patient will complete the course for aspiration pneumonia with oral Augmentin. Continue probiotics while on antibiotics. Pro calcitonin has improved 1.79 at the time of discharge.  UTI - urine culture showed Escherichia coli, sensitive to Zosyn. Urine culture came back as ESBL sensitive to Zosyn. Patient will be given 1 dose of 3g fosfomycin prior to discharge and repeat second dose of fosfomycin on 12/09/14    Hypertension and tachycardia - BP better after starting oral meds -Continue clonidine, lisinopril, added metoprolol 50 mg BID   Depression, cerebral palsy and mental retardation - Per the nursing staff at the facility patient is well oriented and talkative  Hypothermia - Resolved likely due to medication  Seizure disorder continue oral Vimpat and Ativan as needed Continue seizure precautions EEG done showed  abnormal with moderate generalized nonspecific continuous slowing of cerebral activity consistent with history of cerebral palsy and mental delay, no evidence of epileptic activity was demonstrated. Patient will continue Vimpat.   Hypokalemia Replaced  History of GI bleed Continue zantac and carafate.   Disposition planning was discussed with patient's father Mr. LUX MEADERS prior to discharge and was agreeable with the plan.  Day of Discharge BP 180/86 mmHg  Pulse 104  Temp(Src) 98.5 F (36.9 C) (Oral)  Resp 18  Wt 52.2 kg  (115 lb 1.3 oz)  SpO2 95%  Physical Exam: General: Alert and awake, not in any acute distress, mental status close to baseline . CVS: S1-S2 clear no murmur rubs or gallops Chest: clear to auscultation bilaterally, no wheezing rales or rhonchi Abdomen: soft nontender, nondistended, normal bowel sounds Extremities: no cyanosis, clubbing or edema noted bilaterally    The results of significant diagnostics from this hospitalization (including imaging, microbiology, ancillary and laboratory) are listed below for reference.    LAB RESULTS: Basic Metabolic Panel:  Recent Labs Lab 12/06/14 0245 12/07/14 0715  NA 140 138  K 3.0* 3.1*  CL 106 105  CO2 22 21  GLUCOSE 125* 93  BUN 14 12  CREATININE 1.06 0.90  CALCIUM 8.9 8.5   Liver Function Tests:  Recent Labs Lab 12/02/14 0643 12/04/14 0325  AST 32 28  ALT 21 19  ALKPHOS 85 72  BILITOT 0.6 1.2  PROT 6.6 6.3  ALBUMIN 3.4* 2.9*    Recent Labs Lab 12/04/14 0325  LIPASE 23  AMYLASE 275*   No results for input(s): AMMONIA in the last 168 hours. CBC:  Recent Labs Lab 12/02/14 0643  12/06/14 0245 12/07/14 0715  WBC 5.2  < > 11.3* 7.9  NEUTROABS 2.9  --   --   --   HGB 13.3  < > 11.6* 11.9*  HCT 38.4*  < > 34.2* 35.0*  MCV 92.8  < > 92.4 92.8  PLT 160  < > 150 170  < > = values in this interval not displayed. Cardiac Enzymes:  Recent Labs Lab 12/02/14 0643  TROPONINI <0.03   BNP: Invalid input(s): POCBNP CBG:  Recent Labs Lab 12/03/14 1601 12/06/14 1620  GLUCAP 123* 107*    Significant Diagnostic Studies:  Ct Head Wo Contrast  12/02/2014   CLINICAL DATA:  Confusion, history of seizures and cerebral palsy  EXAM: CT HEAD WITHOUT CONTRAST  TECHNIQUE: Contiguous axial images were obtained from the base of the skull through the vertex without intravenous contrast.  COMPARISON:  07/05/2013  FINDINGS: The bony calvarium is intact. Diffuse basal ganglia calcifications are again identified. Mild atrophic  changes and chronic white matter ischemic change is seen. No  findings to suggest acute hemorrhage, acute infarction or space-occupying mass lesion are noted. Calcification in the pons and left frontal lobe are again seen and stable. No acute abnormality is noted.  IMPRESSION: Chronic atrophic and ischemic changes.  No acute abnormality noted.   Electronically Signed   By: Alcide CleverMark  Lukens M.D.   On: 12/02/2014 11:38   Dg Chest Port 1 View  12/03/2014   CLINICAL DATA:  Shortness of breath.  EXAM: PORTABLE CHEST - 1 VIEW  COMPARISON:  12/02/2014  FINDINGS: Shallow inspiration. Borderline heart size with normal pulmonary vascularity. There is a small right pleural effusion with basilar infiltration or atelectasis. Appearance may indicate pneumonia. Left lung appears clear and expanded. Volume loss in the right lung. Degenerative changes in the spine and shoulders. No pneumothorax.  IMPRESSION: Infiltration or atelectasis in the right lung base with right pleural effusion may indicate pneumonia.   Electronically Signed   By: Burman NievesWilliam  Stevens M.D.   On: 12/03/2014 17:36   Dg Chest Port 1 View  12/02/2014   CLINICAL DATA:  Altered mental status  EXAM: PORTABLE CHEST - 1 VIEW  COMPARISON:  November 05, 2014.  FINDINGS: There is mild atelectasis in the right base. Lungs elsewhere clear. Heart is upper normal in size with pulmonary vascularity within normal limits. No adenopathy. There is a hiatal hernia present. No bone lesions.  IMPRESSION: Atelectasis right base. Lungs otherwise clear. Hiatal hernia present.   Electronically Signed   By: Bretta BangWilliam  Woodruff III M.D.   On: 12/02/2014 06:58    2D ECHO:   Disposition and Follow-up: Discharge Instructions    Diet - low sodium heart healthy    Complete by:  As directed      Discharge instructions    Complete by:  As directed   Diet: DYSPHAGIA 2 WITH NECTAR THICK LIQUIDS, with full assistance     Increase activity slowly    Complete by:  As directed              DISPOSITION: Assisted living facility   DISCHARGE FOLLOW-UP Follow-up Information    Follow up with Julian HySOUTH,STEPHEN ALAN, MD. Schedule an appointment as soon as possible for a visit in 2 weeks.   Specialty:  Endocrinology   Why:  for hospital follow-up   Contact information:   [address] [place] [address] [phone]        Time spent on Discharge: 45 mins  Signed:   Melquiades Kovar M.D. [facility] 12/07/2014, 1:57 PM Pager: [phone]

## 2014-12-07 NOTE — ED Notes (Addendum)
Spoke with Case Manager stated patient able to be discharged to same facility. [facility] called.

## 2014-12-07 NOTE — ED Provider Notes (Signed)
CSN: [id number]     Arrival date & time 12/07/14  1827 History   First MD Initiated Contact with Patient 12/07/14 1830     Chief Complaint  Patient presents with  . Shortness of Breath     (Consider location/radiation/quality/duration/timing/severity/associated sxs/prior Treatment) HPI Patient was discharged from the hospital to the nursing home a few hours ago. Discharge on 4 L oxygen. Patient reportedly got the nursing home and said they can't take him because he is on oxygen and there is not an order for it. Social work and patient's PCP are attempting to place him at [facility]. Will need monitoring of his oxygen here. No other complaints. Patient has baseline mental retardation.   Past Medical History  Diagnosis Date  . Seizures   . Cerebral palsy   . Hypertension   . Mental retardation   . Depression   . Nephrolithiasis   . BPH (benign prostatic hyperplasia)   . GI bleed 2009    coffee ground emesis, erosive esophagitis on EGD by Dr Juanda Chance  . Esophageal stricture 2009    not dilated during the EGD, biopsy benign:  no Barretts  . Depression with anxiety    Past Surgical History  Procedure Laterality Date  . Basal cell carcinoma excision  2002    forehead  . Squamous cell carcinoma excision  2004    lip  . Bowen dz  2002    right cheek   . Esophagogastroduodenoscopy  06/14/2012    Procedure: ESOPHAGOGASTRODUODENOSCOPY (EGD);  Surgeon: Rachael Fee, MD;  Location: [facility] ENDOSCOPY;  Service: Endoscopy;  Laterality: N/A;  . Multiple extractions with alveoloplasty N/A 06/20/2014    Procedure: MULTIPLE EXTRACTIONS TEETH NUMBER TWO, TEN, TWELVE, EIGHTEEN, TWENTY-ONE, TWENTY-TWO, TWENTY-NINE, THIRTY-TWO;  Surgeon: Georgia Lopes, DDS;  Location: [facility] OR;  Service: Oral Surgery;  Laterality: N/A;   No family history on file. History  Substance Use Topics  . Smoking status: Never Smoker   . Smokeless tobacco: Not on file  . Alcohol Use: No    Review of Systems   Constitutional: Negative for fever.  Respiratory: Positive for shortness of breath.       Allergies  Review of patient's allergies indicates no known allergies.  Home Medications   Prior to Admission medications   Medication Sig Start Date End Date Taking? Authorizing Provider  albuterol (PROVENTIL) (2.5 MG/3ML) 0.083% nebulizer solution Take 3 mLs (2.5 mg total) by nebulization every 6 (six) hours as needed for wheezing or shortness of breath. 11/07/14   Richarda Overlie, MD  amoxicillin-clavulanate (AUGMENTIN) 875-125 MG per tablet Take 1 tablet by mouth 2 (two) times daily. X 4 more days 12/07/14   Ripudeep Jenna Luo, MD  cloNIDine (CATAPRES) 0.2 MG tablet Take 1 tablet (0.2 mg total) by mouth 2 (two) times daily. 11/07/14   Richarda Overlie, MD  cycloSPORINE (RESTASIS) 0.05 % ophthalmic emulsion Place 1 drop into both eyes 2 (two) times daily.    Historical Provider, MD  Dutasteride-Tamsulosin HCl (JALYN) 0.5-0.4 MG CAPS Take 1 tablet by mouth daily with breakfast.     Historical Provider, MD  escitalopram (LEXAPRO) 20 MG tablet Take 20 mg by mouth daily with breakfast.     Historical Provider, MD  food thickener (THICK IT) POWD Take 1 g by mouth 2 (two) times daily.    Historical Provider, MD  fosfomycin (MONUROL) 3 G PACK Take 3 g by mouth once. Please give 1 tab on 12/09/2014 12/09/14   Ripudeep Jenna Luo, MD  guaiFENesin (  ROBITUSSIN) 100 MG/5ML SOLN Take 10 mLs by mouth 3 (three) times daily. For 10 days 10/27/14   Historical Provider, MD  Lacosamide (VIMPAT) 150 MG TABS Take 150 mg by mouth 2 (two) times daily.    Historical Provider, MD  lisinopril (PRINIVIL,ZESTRIL) 30 MG tablet Take 1 tablet (30 mg total) by mouth daily with breakfast. Patient taking differently: Take 30 mg by mouth 2 (two) times daily.  11/07/14   Richarda OverlieNayana Abrol, MD  LORazepam (ATIVAN) 1 MG tablet Take 1 tablet (1 mg total) by mouth every 8 (eight) hours as needed for anxiety. 12/07/14   Ripudeep Jenna LuoK Rai, MD  Maltodextrin-Xanthan Gum (RESOURCE  THICKENUP CLEAR) POWD 2 times a day with meals Patient not taking: Reported on 12/02/2014 11/07/14   Richarda OverlieNayana Abrol, MD  metoprolol (LOPRESSOR) 50 MG tablet Take 1 tablet (50 mg total) by mouth 2 (two) times daily. 12/07/14   Ripudeep Jenna LuoK Rai, MD  Multiple Vitamin (DAILY VITE PO) Take 1 tablet by mouth daily with breakfast.     Historical Provider, MD  ondansetron (ZOFRAN) 4 MG tablet Take 4 mg by mouth 3 (three) times daily as needed for nausea or vomiting.    Historical Provider, MD  potassium chloride 20 MEQ TBCR Take 20 mEq by mouth daily. Patient not taking: Reported on 12/02/2014 11/07/14   Richarda OverlieNayana Abrol, MD  prazosin (MINIPRESS) 2 MG capsule Take 2 mg by mouth at bedtime.     Historical Provider, MD  promethazine (PHENERGAN) 12.5 MG tablet Take 12.5 mg by mouth every 12 (twelve) hours as needed for nausea or vomiting.    Historical Provider, MD  ranitidine (ZANTAC) 150 MG tablet Take 150 mg by mouth 2 (two) times daily.    Historical Provider, MD  saccharomyces boulardii (FLORASTOR) 250 MG capsule Take 1 capsule (250 mg total) by mouth 2 (two) times daily. While on antibiotics 12/07/14   Ripudeep K Rai, MD  sucralfate (CARAFATE) 1 GM/10ML suspension Take 1 g by mouth 2 (two) times daily.    Historical Provider, MD   BP 153/79 mmHg  Pulse 95  Temp(Src) 98.2 F (36.8 C) (Oral)  Resp 18  Ht 5\' 7"  (1.702 m)  Wt 115 lb (52.164 kg)  BMI 18.01 kg/m2  SpO2 98% Physical Exam  HENT:  Head: Normocephalic.  Eyes: Pupils are equal, round, and reactive to light.  Neck: Neck supple.  Cardiovascular: Normal rate.   Pulmonary/Chest: Effort normal.  Neurological: He is alert.  Skin: Skin is warm.    ED Course  Procedures (including critical care time) Labs Review Labs Reviewed - No data to display  Imaging Review No results found.   EKG Interpretation None      MDM   Final diagnoses:  HCAP (healthcare-associated pneumonia)    Patient does not have an oxygen requirement here. Social work has  seen patient and he can go back to his nursing home. Diagnosis of age Given since that was a diagnosis on his recent admission.    Juliet RudeNathan R. Rubin PayorPickering, MD 12/07/14 1946

## 2014-12-10 LAB — CULTURE, BLOOD (ROUTINE X 2)
Culture: NO GROWTH
Culture: NO GROWTH

## 2015-06-20 ENCOUNTER — Emergency Department (HOSPITAL_COMMUNITY): Payer: Medicare Other

## 2015-06-20 ENCOUNTER — Inpatient Hospital Stay (HOSPITAL_COMMUNITY): Payer: Medicare Other

## 2015-06-20 ENCOUNTER — Inpatient Hospital Stay (HOSPITAL_COMMUNITY)
Admission: EM | Admit: 2015-06-20 | Discharge: 2015-07-12 | DRG: 368 | Disposition: A | Discharge status: Nursing Home (Includes ICF) | Payer: Medicare Other | Attending: Internal Medicine | Admitting: Internal Medicine

## 2015-06-20 ENCOUNTER — Encounter (HOSPITAL_COMMUNITY)
Admission: EM | Disposition: A | Discharge status: Nursing Home (Includes ICF) | Payer: Self-pay | Source: Home / Self Care | Attending: Pulmonary Disease

## 2015-06-20 ENCOUNTER — Encounter (HOSPITAL_COMMUNITY): Payer: Self-pay | Admitting: Emergency Medicine

## 2015-06-20 DIAGNOSIS — R110 Nausea: Secondary | ICD-10-CM | POA: Diagnosis not present

## 2015-06-20 DIAGNOSIS — I4510 Unspecified right bundle-branch block: Secondary | ICD-10-CM | POA: Diagnosis present

## 2015-06-20 DIAGNOSIS — J9600 Acute respiratory failure, unspecified whether with hypoxia or hypercapnia: Secondary | ICD-10-CM

## 2015-06-20 DIAGNOSIS — Z85828 Personal history of other malignant neoplasm of skin: Secondary | ICD-10-CM | POA: Diagnosis not present

## 2015-06-20 DIAGNOSIS — N179 Acute kidney failure, unspecified: Secondary | ICD-10-CM | POA: Diagnosis not present

## 2015-06-20 DIAGNOSIS — Z66 Do not resuscitate: Secondary | ICD-10-CM | POA: Diagnosis not present

## 2015-06-20 DIAGNOSIS — Z9911 Dependence on respirator [ventilator] status: Secondary | ICD-10-CM

## 2015-06-20 DIAGNOSIS — J9601 Acute respiratory failure with hypoxia: Secondary | ICD-10-CM | POA: Diagnosis present

## 2015-06-20 DIAGNOSIS — J9690 Respiratory failure, unspecified, unspecified whether with hypoxia or hypercapnia: Secondary | ICD-10-CM | POA: Diagnosis present

## 2015-06-20 DIAGNOSIS — K2970 Gastritis, unspecified, without bleeding: Secondary | ICD-10-CM | POA: Diagnosis present

## 2015-06-20 DIAGNOSIS — Z515 Encounter for palliative care: Secondary | ICD-10-CM | POA: Diagnosis not present

## 2015-06-20 DIAGNOSIS — Z7189 Other specified counseling: Secondary | ICD-10-CM | POA: Diagnosis not present

## 2015-06-20 DIAGNOSIS — E876 Hypokalemia: Secondary | ICD-10-CM | POA: Diagnosis present

## 2015-06-20 DIAGNOSIS — R1310 Dysphagia, unspecified: Secondary | ICD-10-CM | POA: Diagnosis not present

## 2015-06-20 DIAGNOSIS — K920 Hematemesis: Secondary | ICD-10-CM

## 2015-06-20 DIAGNOSIS — R4702 Dysphasia: Secondary | ICD-10-CM | POA: Diagnosis present

## 2015-06-20 DIAGNOSIS — E162 Hypoglycemia, unspecified: Secondary | ICD-10-CM | POA: Diagnosis not present

## 2015-06-20 DIAGNOSIS — R1110 Vomiting, unspecified: Secondary | ICD-10-CM

## 2015-06-20 DIAGNOSIS — R1314 Dysphagia, pharyngoesophageal phase: Secondary | ICD-10-CM | POA: Clinically undetermined

## 2015-06-20 DIAGNOSIS — Z79899 Other long term (current) drug therapy: Secondary | ICD-10-CM

## 2015-06-20 DIAGNOSIS — R112 Nausea with vomiting, unspecified: Secondary | ICD-10-CM | POA: Diagnosis present

## 2015-06-20 DIAGNOSIS — K2971 Gastritis, unspecified, with bleeding: Secondary | ICD-10-CM | POA: Diagnosis not present

## 2015-06-20 DIAGNOSIS — J690 Pneumonitis due to inhalation of food and vomit: Secondary | ICD-10-CM | POA: Diagnosis present

## 2015-06-20 DIAGNOSIS — F418 Other specified anxiety disorders: Secondary | ICD-10-CM | POA: Diagnosis present

## 2015-06-20 DIAGNOSIS — N400 Enlarged prostate without lower urinary tract symptoms: Secondary | ICD-10-CM | POA: Diagnosis present

## 2015-06-20 DIAGNOSIS — K209 Esophagitis, unspecified without bleeding: Secondary | ICD-10-CM | POA: Diagnosis present

## 2015-06-20 DIAGNOSIS — F329 Major depressive disorder, single episode, unspecified: Secondary | ICD-10-CM | POA: Diagnosis present

## 2015-06-20 DIAGNOSIS — D72829 Elevated white blood cell count, unspecified: Secondary | ICD-10-CM | POA: Insufficient documentation

## 2015-06-20 DIAGNOSIS — F32A Depression, unspecified: Secondary | ICD-10-CM | POA: Diagnosis present

## 2015-06-20 DIAGNOSIS — G40909 Epilepsy, unspecified, not intractable, without status epilepticus: Secondary | ICD-10-CM

## 2015-06-20 DIAGNOSIS — J150 Pneumonia due to Klebsiella pneumoniae: Secondary | ICD-10-CM | POA: Diagnosis present

## 2015-06-20 DIAGNOSIS — E872 Acidosis, unspecified: Secondary | ICD-10-CM

## 2015-06-20 DIAGNOSIS — F79 Unspecified intellectual disabilities: Secondary | ICD-10-CM | POA: Diagnosis present

## 2015-06-20 DIAGNOSIS — I10 Essential (primary) hypertension: Secondary | ICD-10-CM | POA: Diagnosis present

## 2015-06-20 DIAGNOSIS — D649 Anemia, unspecified: Secondary | ICD-10-CM | POA: Diagnosis not present

## 2015-06-20 DIAGNOSIS — T17908A Unspecified foreign body in respiratory tract, part unspecified causing other injury, initial encounter: Secondary | ICD-10-CM

## 2015-06-20 DIAGNOSIS — I469 Cardiac arrest, cause unspecified: Secondary | ICD-10-CM | POA: Diagnosis not present

## 2015-06-20 DIAGNOSIS — K922 Gastrointestinal hemorrhage, unspecified: Secondary | ICD-10-CM | POA: Diagnosis present

## 2015-06-20 DIAGNOSIS — K226 Gastro-esophageal laceration-hemorrhage syndrome: Secondary | ICD-10-CM | POA: Diagnosis present

## 2015-06-20 DIAGNOSIS — Y95 Nosocomial condition: Secondary | ICD-10-CM | POA: Diagnosis present

## 2015-06-20 DIAGNOSIS — J984 Other disorders of lung: Secondary | ICD-10-CM | POA: Diagnosis present

## 2015-06-20 DIAGNOSIS — K5790 Diverticulosis of intestine, part unspecified, without perforation or abscess without bleeding: Secondary | ICD-10-CM | POA: Diagnosis present

## 2015-06-20 DIAGNOSIS — R627 Adult failure to thrive: Secondary | ICD-10-CM | POA: Diagnosis present

## 2015-06-20 DIAGNOSIS — K449 Diaphragmatic hernia without obstruction or gangrene: Secondary | ICD-10-CM | POA: Diagnosis present

## 2015-06-20 DIAGNOSIS — J90 Pleural effusion, not elsewhere classified: Secondary | ICD-10-CM | POA: Diagnosis present

## 2015-06-20 DIAGNOSIS — J189 Pneumonia, unspecified organism: Secondary | ICD-10-CM | POA: Diagnosis present

## 2015-06-20 DIAGNOSIS — Z0189 Encounter for other specified special examinations: Secondary | ICD-10-CM

## 2015-06-20 DIAGNOSIS — K222 Esophageal obstruction: Secondary | ICD-10-CM | POA: Diagnosis present

## 2015-06-20 DIAGNOSIS — E878 Other disorders of electrolyte and fluid balance, not elsewhere classified: Secondary | ICD-10-CM | POA: Diagnosis not present

## 2015-06-20 DIAGNOSIS — D62 Acute posthemorrhagic anemia: Secondary | ICD-10-CM | POA: Insufficient documentation

## 2015-06-20 DIAGNOSIS — Z8711 Personal history of peptic ulcer disease: Secondary | ICD-10-CM

## 2015-06-20 DIAGNOSIS — E869 Volume depletion, unspecified: Secondary | ICD-10-CM | POA: Diagnosis present

## 2015-06-20 DIAGNOSIS — G809 Cerebral palsy, unspecified: Secondary | ICD-10-CM | POA: Diagnosis present

## 2015-06-20 DIAGNOSIS — Z87442 Personal history of urinary calculi: Secondary | ICD-10-CM | POA: Diagnosis not present

## 2015-06-20 DIAGNOSIS — Z4659 Encounter for fitting and adjustment of other gastrointestinal appliance and device: Secondary | ICD-10-CM

## 2015-06-20 HISTORY — PX: ESOPHAGOGASTRODUODENOSCOPY: SHX5428

## 2015-06-20 LAB — CBC WITH DIFFERENTIAL/PLATELET
BASOS ABS: 0 10*3/uL (ref 0.0–0.1)
BASOS ABS: 0 10*3/uL (ref 0.0–0.1)
BASOS PCT: 0 % (ref 0–1)
BASOS PCT: 0 % (ref 0–1)
BASOS PCT: 0 % (ref 0–1)
Basophils Absolute: 0 10*3/uL (ref 0.0–0.1)
Basophils Absolute: 0 10*3/uL (ref 0.0–0.1)
Basophils Absolute: 0 10*3/uL (ref 0.0–0.1)
Basophils Absolute: 0 10*3/uL (ref 0.0–0.1)
Basophils Relative: 0 % (ref 0–1)
Basophils Relative: 0 % (ref 0–1)
Basophils Relative: 0 % (ref 0–1)
EOS ABS: 0 10*3/uL (ref 0.0–0.7)
EOS ABS: 0 10*3/uL (ref 0.0–0.7)
EOS ABS: 0.1 10*3/uL (ref 0.0–0.7)
EOS ABS: 0.2 10*3/uL (ref 0.0–0.7)
EOS PCT: 1 % (ref 0–5)
EOS PCT: 2 % (ref 0–5)
Eosinophils Absolute: 0 10*3/uL (ref 0.0–0.7)
Eosinophils Absolute: 0.2 10*3/uL (ref 0.0–0.7)
Eosinophils Relative: 0 % (ref 0–5)
Eosinophils Relative: 0 % (ref 0–5)
Eosinophils Relative: 0 % (ref 0–5)
Eosinophils Relative: 2 % (ref 0–5)
HCT: 31.6 % — ABNORMAL LOW (ref 39.0–52.0)
HCT: 27.5 % — ABNORMAL LOW (ref 39.0–52.0)
HCT: 27 % — ABNORMAL LOW (ref 39.0–52.0)
HCT: 28.4 % — ABNORMAL LOW (ref 39.0–52.0)
HEMATOCRIT: 31.2 % — AB (ref 39.0–52.0)
HEMATOCRIT: 30.5 % — AB (ref 39.0–52.0)
HEMOGLOBIN: 10.6 g/dL — AB (ref 13.0–17.0)
HEMOGLOBIN: 10.6 g/dL — AB (ref 13.0–17.0)
HEMOGLOBIN: 10.3 g/dL — AB (ref 13.0–17.0)
HEMOGLOBIN: 9.4 g/dL — AB (ref 13.0–17.0)
Hemoglobin: 9.1 g/dL — ABNORMAL LOW (ref 13.0–17.0)
Hemoglobin: 9.7 g/dL — ABNORMAL LOW (ref 13.0–17.0)
LYMPHS ABS: 0.4 10*3/uL — AB (ref 0.7–4.0)
LYMPHS ABS: 0.6 10*3/uL — AB (ref 0.7–4.0)
LYMPHS ABS: 1 10*3/uL (ref 0.7–4.0)
LYMPHS ABS: 0.8 10*3/uL (ref 0.7–4.0)
LYMPHS ABS: 1.1 10*3/uL (ref 0.7–4.0)
LYMPHS PCT: 5 % — AB (ref 12–46)
LYMPHS PCT: 8 % — AB (ref 12–46)
Lymphocytes Relative: 6 % — ABNORMAL LOW (ref 12–46)
Lymphocytes Relative: 12 % (ref 12–46)
Lymphocytes Relative: 9 % — ABNORMAL LOW (ref 12–46)
Lymphocytes Relative: 9 % — ABNORMAL LOW (ref 12–46)
Lymphs Abs: 0.3 10*3/uL — ABNORMAL LOW (ref 0.7–4.0)
MCH: 32.6 pg (ref 26.0–34.0)
MCH: 32.6 pg (ref 26.0–34.0)
MCH: 32.7 pg (ref 26.0–34.0)
MCH: 33 pg (ref 26.0–34.0)
MCH: 32.5 pg (ref 26.0–34.0)
MCH: 32.6 pg (ref 26.0–34.0)
MCHC: 33.5 g/dL (ref 30.0–36.0)
MCHC: 34 g/dL (ref 30.0–36.0)
MCHC: 33.8 g/dL (ref 30.0–36.0)
MCHC: 34.2 g/dL (ref 30.0–36.0)
MCHC: 33.7 g/dL (ref 30.0–36.0)
MCHC: 34.2 g/dL (ref 30.0–36.0)
MCV: 97.2 fL (ref 78.0–100.0)
MCV: 96 fL (ref 78.0–100.0)
MCV: 96.8 fL (ref 78.0–100.0)
MCV: 96.5 fL (ref 78.0–100.0)
MCV: 96.4 fL (ref 78.0–100.0)
MCV: 95.3 fL (ref 78.0–100.0)
MONO ABS: 0.3 10*3/uL (ref 0.1–1.0)
MONO ABS: 0.5 10*3/uL (ref 0.1–1.0)
MONO ABS: 0.1 10*3/uL (ref 0.1–1.0)
MONO ABS: 0.5 10*3/uL (ref 0.1–1.0)
MONOS PCT: 4 % (ref 3–12)
MONOS PCT: 5 % (ref 3–12)
MONOS PCT: 4 % (ref 3–12)
Monocytes Absolute: 0.4 10*3/uL (ref 0.1–1.0)
Monocytes Absolute: 0.3 10*3/uL (ref 0.1–1.0)
Monocytes Relative: 8 % (ref 3–12)
Monocytes Relative: 1 % — ABNORMAL LOW (ref 3–12)
Monocytes Relative: 4 % (ref 3–12)
NEUTROS ABS: 5.2 10*3/uL (ref 1.7–7.7)
NEUTROS ABS: 7.8 10*3/uL — AB (ref 1.7–7.7)
NEUTROS ABS: 9.9 10*3/uL — AB (ref 1.7–7.7)
NEUTROS PCT: 90 % — AB (ref 43–77)
NEUTROS PCT: 87 % — AB (ref 43–77)
NEUTROS PCT: 83 % — AB (ref 43–77)
Neutro Abs: 5.6 10*3/uL (ref 1.7–7.7)
Neutro Abs: 6.5 10*3/uL (ref 1.7–7.7)
Neutro Abs: 6.7 10*3/uL (ref 1.7–7.7)
Neutrophils Relative %: 87 % — ABNORMAL HIGH (ref 43–77)
Neutrophils Relative %: 88 % — ABNORMAL HIGH (ref 43–77)
Neutrophils Relative %: 85 % — ABNORMAL HIGH (ref 43–77)
PLATELETS: 133 10*3/uL — AB (ref 150–400)
Platelets: 190 10*3/uL (ref 150–400)
Platelets: 160 10*3/uL (ref 150–400)
Platelets: 157 10*3/uL (ref 150–400)
Platelets: 150 10*3/uL (ref 150–400)
Platelets: 168 10*3/uL (ref 150–400)
RBC: 3.25 MIL/uL — ABNORMAL LOW (ref 4.22–5.81)
RBC: 3.25 MIL/uL — AB (ref 4.22–5.81)
RBC: 3.15 MIL/uL — ABNORMAL LOW (ref 4.22–5.81)
RBC: 2.85 MIL/uL — ABNORMAL LOW (ref 4.22–5.81)
RBC: 2.8 MIL/uL — AB (ref 4.22–5.81)
RBC: 2.98 MIL/uL — ABNORMAL LOW (ref 4.22–5.81)
RDW: 14.9 % (ref 11.5–15.5)
RDW: 14.8 % (ref 11.5–15.5)
RDW: 15 % (ref 11.5–15.5)
RDW: 15.2 % (ref 11.5–15.5)
RDW: 15.2 % (ref 11.5–15.5)
RDW: 15.2 % (ref 11.5–15.5)
WBC Morphology: INCREASED
WBC: 6.2 10*3/uL (ref 4.0–10.5)
WBC: 6.1 10*3/uL (ref 4.0–10.5)
WBC: 7.5 10*3/uL (ref 4.0–10.5)
WBC: 8.1 10*3/uL (ref 4.0–10.5)
WBC: 8.9 10*3/uL (ref 4.0–10.5)
WBC: 11.7 10*3/uL — ABNORMAL HIGH (ref 4.0–10.5)

## 2015-06-20 LAB — BLOOD GAS, ARTERIAL
ACID-BASE DEFICIT: 10.6 mmol/L — AB (ref 0.0–2.0)
ACID-BASE DEFICIT: 10.6 mmol/L — AB (ref 0.0–2.0)
BICARBONATE: 14.7 meq/L — AB (ref 20.0–24.0)
Bicarbonate: 14.4 mEq/L — ABNORMAL LOW (ref 20.0–24.0)
Drawn by: 232811
Drawn by: 232811
FIO2: 1
FIO2: 1
LHR: 14 {breaths}/min
MECHVT: 530 mL
O2 SAT: 98.1 %
O2 Saturation: 94.1 %
PATIENT TEMPERATURE: 98.4
PATIENT TEMPERATURE: 100.4
PCO2 ART: 31.8 mmHg — AB (ref 35.0–45.0)
PCO2 ART: 31.9 mmHg — AB (ref 35.0–45.0)
PEEP/CPAP: 5 cmH2O
PH ART: 7.286 — AB (ref 7.350–7.450)
PH ART: 7.283 — AB (ref 7.350–7.450)
PO2 ART: 80.9 mmHg (ref 80.0–100.0)
PO2 ART: 126 mmHg — AB (ref 80.0–100.0)
TCO2: 13.8 mmol/L (ref 0–100)
TCO2: 13.5 mmol/L (ref 0–100)

## 2015-06-20 LAB — COMPREHENSIVE METABOLIC PANEL
ALBUMIN: 3.7 g/dL (ref 3.5–5.0)
ALK PHOS: 96 U/L (ref 38–126)
ALT: 26 U/L (ref 17–63)
AST: 28 U/L (ref 15–41)
Anion gap: 8 (ref 5–15)
BUN: 40 mg/dL — ABNORMAL HIGH (ref 6–20)
CO2: 19 mmol/L — AB (ref 22–32)
Calcium: 9.3 mg/dL (ref 8.9–10.3)
Chloride: 110 mmol/L (ref 101–111)
Creatinine, Ser: 1.43 mg/dL — ABNORMAL HIGH (ref 0.61–1.24)
GFR calc Af Amer: 58 mL/min — ABNORMAL LOW (ref >60)
GFR calc non Af Amer: 50 mL/min — ABNORMAL LOW (ref >60)
GLUCOSE: 105 mg/dL — AB (ref 65–99)
POTASSIUM: 5.5 mmol/L — AB (ref 3.5–5.1)
SODIUM: 137 mmol/L (ref 135–145)
Total Bilirubin: 0.4 mg/dL (ref 0.3–1.2)
Total Protein: 7.3 g/dL (ref 6.5–8.1)

## 2015-06-20 LAB — URINALYSIS, ROUTINE W REFLEX MICROSCOPIC
Bilirubin Urine: NEGATIVE
GLUCOSE, UA: NEGATIVE mg/dL
KETONES UR: NEGATIVE mg/dL
LEUKOCYTES UA: NEGATIVE
NITRITE: NEGATIVE
PROTEIN: NEGATIVE mg/dL
Specific Gravity, Urine: 1.015 (ref 1.005–1.030)
Urobilinogen, UA: 0.2 mg/dL (ref 0.0–1.0)
pH: 5 (ref 5.0–8.0)

## 2015-06-20 LAB — BASIC METABOLIC PANEL
Anion gap: 4 — ABNORMAL LOW (ref 5–15)
BUN: 33 mg/dL — AB (ref 6–20)
CO2: 16 mmol/L — ABNORMAL LOW (ref 22–32)
CREATININE: 1.22 mg/dL (ref 0.61–1.24)
Calcium: 7.6 mg/dL — ABNORMAL LOW (ref 8.9–10.3)
Chloride: 120 mmol/L — ABNORMAL HIGH (ref 101–111)
GFR calc Af Amer: >60 mL/min (ref >60)
GLUCOSE: 64 mg/dL — AB (ref 65–99)
Potassium: 4.9 mmol/L (ref 3.5–5.1)
SODIUM: 140 mmol/L (ref 135–145)

## 2015-06-20 LAB — I-STAT CHEM 8, ED
BUN: 43 mg/dL — AB (ref 6–20)
BUN: 40 mg/dL — ABNORMAL HIGH (ref 6–20)
CREATININE: 1.4 mg/dL — AB (ref 0.61–1.24)
Calcium, Ion: 1.28 mmol/L (ref 1.13–1.30)
Calcium, Ion: 1.22 mmol/L (ref 1.13–1.30)
Chloride: 111 mmol/L (ref 101–111)
Chloride: 116 mmol/L — ABNORMAL HIGH (ref 101–111)
Creatinine, Ser: 1.2 mg/dL (ref 0.61–1.24)
Glucose, Bld: 103 mg/dL — ABNORMAL HIGH (ref 65–99)
Glucose, Bld: 76 mg/dL (ref 65–99)
HEMATOCRIT: 39 % (ref 39.0–52.0)
HEMATOCRIT: 31 % — AB (ref 39.0–52.0)
HEMOGLOBIN: 10.5 g/dL — AB (ref 13.0–17.0)
Hemoglobin: 13.3 g/dL (ref 13.0–17.0)
POTASSIUM: 5.7 mmol/L — AB (ref 3.5–5.1)
Potassium: 5.5 mmol/L — ABNORMAL HIGH (ref 3.5–5.1)
Sodium: 138 mmol/L (ref 135–145)
Sodium: 141 mmol/L (ref 135–145)
TCO2: 18 mmol/L (ref 0–100)
TCO2: 17 mmol/L (ref 0–100)

## 2015-06-20 LAB — GLUCOSE, CAPILLARY
GLUCOSE-CAPILLARY: 54 mg/dL — AB (ref 65–99)
GLUCOSE-CAPILLARY: 55 mg/dL — AB (ref 65–99)
GLUCOSE-CAPILLARY: 150 mg/dL — AB (ref 65–99)
GLUCOSE-CAPILLARY: 57 mg/dL — AB (ref 65–99)
Glucose-Capillary: 57 mg/dL — ABNORMAL LOW (ref 65–99)
Glucose-Capillary: 100 mg/dL — ABNORMAL HIGH (ref 65–99)

## 2015-06-20 LAB — I-STAT TROPONIN, ED: Troponin i, poc: 0.01 ng/mL (ref 0.00–0.08)

## 2015-06-20 LAB — DIFFERENTIAL
Basophils Absolute: 0 10*3/uL (ref 0.0–0.1)
Basophils Relative: 0 % (ref 0–1)
EOS ABS: 0.2 10*3/uL (ref 0.0–0.7)
Eosinophils Relative: 2 % (ref 0–5)
Lymphocytes Relative: 12 % (ref 12–46)
Lymphs Abs: 0.8 10*3/uL (ref 0.7–4.0)
MONO ABS: 0.2 10*3/uL (ref 0.1–1.0)
MONOS PCT: 3 % (ref 3–12)
NEUTROS ABS: 5.8 10*3/uL (ref 1.7–7.7)
Neutrophils Relative %: 83 % — ABNORMAL HIGH (ref 43–77)

## 2015-06-20 LAB — LIPASE, BLOOD: Lipase: 49 U/L (ref 22–51)

## 2015-06-20 LAB — OCCULT BLOOD GASTRIC / DUODENUM (SPECIMEN CUP)
OCCULT BLOOD, GASTRIC: POSITIVE — AB
PH, GASTRIC: 3

## 2015-06-20 LAB — CBC
HCT: 37.3 % — ABNORMAL LOW (ref 39.0–52.0)
HEMOGLOBIN: 12.4 g/dL — AB (ref 13.0–17.0)
MCH: 32.2 pg (ref 26.0–34.0)
MCHC: 33.2 g/dL (ref 30.0–36.0)
MCV: 96.9 fL (ref 78.0–100.0)
Platelets: 235 10*3/uL (ref 150–400)
RBC: 3.85 MIL/uL — ABNORMAL LOW (ref 4.22–5.81)
RDW: 14.9 % (ref 11.5–15.5)
WBC: 7 10*3/uL (ref 4.0–10.5)

## 2015-06-20 LAB — APTT: aPTT: 40 seconds — ABNORMAL HIGH (ref 24–37)

## 2015-06-20 LAB — TROPONIN I

## 2015-06-20 LAB — PROTIME-INR
INR: 1.22 (ref 0.00–1.49)
PROTHROMBIN TIME: 15.6 s — AB (ref 11.6–15.2)

## 2015-06-20 LAB — I-STAT CG4 LACTIC ACID, ED: Lactic Acid, Venous: 2.17 mmol/L (ref 0.5–2.0)

## 2015-06-20 LAB — POC OCCULT BLOOD, ED: Fecal Occult Bld: NEGATIVE

## 2015-06-20 LAB — MRSA PCR SCREENING: MRSA by PCR: NEGATIVE

## 2015-06-20 LAB — URINE MICROSCOPIC-ADD ON

## 2015-06-20 LAB — MAGNESIUM: MAGNESIUM: 1.2 mg/dL — AB (ref 1.7–2.4)

## 2015-06-20 LAB — LACTIC ACID, PLASMA
Lactic Acid, Venous: 1.4 mmol/L (ref 0.5–2.0)
Lactic Acid, Venous: 1.4 mmol/L (ref 0.5–2.0)

## 2015-06-20 LAB — PHOSPHORUS: PHOSPHORUS: 2.7 mg/dL (ref 2.5–4.6)

## 2015-06-20 SURGERY — EGD (ESOPHAGOGASTRODUODENOSCOPY)
Anesthesia: Moderate Sedation

## 2015-06-20 MED ORDER — DEXTROSE 50 % IV SOLN
INTRAVENOUS | Status: AC
  Administered 2015-06-20: 50 mL
  Filled 2015-06-20: qty 50

## 2015-06-20 MED ORDER — SUCCINYLCHOLINE CHLORIDE 20 MG/ML IJ SOLN
INTRAMUSCULAR | Status: AC
  Filled 2015-06-20: qty 1

## 2015-06-20 MED ORDER — SODIUM CHLORIDE 0.9 % IV BOLUS (SEPSIS)
1000.0000 mL | Freq: Once | INTRAVENOUS | Status: AC
  Administered 2015-06-20: 1000 mL via INTRAVENOUS

## 2015-06-20 MED ORDER — MAGNESIUM SULFATE 4 GM/100ML IV SOLN
4.0000 g | Freq: Once | INTRAVENOUS | Status: AC
  Administered 2015-06-20: 4 g via INTRAVENOUS
  Filled 2015-06-20: qty 100

## 2015-06-20 MED ORDER — METOCLOPRAMIDE HCL 5 MG/ML IJ SOLN
10.0000 mg | INTRAMUSCULAR | Status: DC | PRN
  Administered 2015-06-20: 10 mg via INTRAVENOUS
  Filled 2015-06-20: qty 2

## 2015-06-20 MED ORDER — SODIUM CHLORIDE 0.9 % IV BOLUS (SEPSIS)
500.0000 mL | Freq: Once | INTRAVENOUS | Status: AC
  Administered 2015-06-20: 500 mL via INTRAVENOUS

## 2015-06-20 MED ORDER — PROPOFOL 1000 MG/100ML IV EMUL
0.0000 ug/kg/min | INTRAVENOUS | Status: DC
Start: 2015-06-20 — End: 2015-06-23
  Administered 2015-06-20 (×2): 25 ug/kg/min via INTRAVENOUS
  Administered 2015-06-20: 35 ug/kg/min via INTRAVENOUS
  Administered 2015-06-20: 25 ug/kg/min via INTRAVENOUS
  Administered 2015-06-21: 35 ug/kg/min via INTRAVENOUS
  Administered 2015-06-21: 15 ug/kg/min via INTRAVENOUS
  Administered 2015-06-21: 30 ug/kg/min via INTRAVENOUS
  Administered 2015-06-22: 25 ug/kg/min via INTRAVENOUS
  Administered 2015-06-22: 35.121 ug/kg/min via INTRAVENOUS
  Administered 2015-06-23: 25 ug/kg/min via INTRAVENOUS
  Filled 2015-06-20 (×10): qty 100

## 2015-06-20 MED ORDER — ALBUTEROL SULFATE (2.5 MG/3ML) 0.083% IN NEBU: 2.5000 mg | INHALATION_SOLUTION | Freq: Four times a day (QID) | RESPIRATORY_TRACT | Status: DC | PRN

## 2015-06-20 MED ORDER — ETOMIDATE 2 MG/ML IV SOLN
20.0000 mg | Freq: Once | INTRAVENOUS | Status: AC
  Administered 2015-06-20: 20 mg via INTRAVENOUS
  Filled 2015-06-20: qty 10

## 2015-06-20 MED ORDER — DUTASTERIDE 0.5 MG PO CAPS: 0.5000 mg | ORAL_CAPSULE | Freq: Every day | ORAL | Status: DC

## 2015-06-20 MED ORDER — DEXTROSE-NACL 5-0.9 % IV SOLN
INTRAVENOUS | Status: DC
Start: 2015-06-20 — End: 2015-06-21
  Administered 2015-06-20: 17:00:00 via INTRAVENOUS

## 2015-06-20 MED ORDER — SODIUM CHLORIDE 0.9 % IV SOLN
1.5000 g | Freq: Four times a day (QID) | INTRAVENOUS | Status: DC
  Administered 2015-06-20 – 2015-06-23 (×12): 1.5 g via INTRAVENOUS
  Filled 2015-06-20 (×16): qty 1.5

## 2015-06-20 MED ORDER — SODIUM CHLORIDE 0.9 % IV BOLUS (SEPSIS)
2000.0000 mL | Freq: Once | INTRAVENOUS | Status: AC
Start: 2015-06-20 — End: 2015-06-20
  Administered 2015-06-20: 2000 mL via INTRAVENOUS

## 2015-06-20 MED ORDER — ANTISEPTIC ORAL RINSE SOLUTION (CORINZ)
7.0000 mL | Freq: Four times a day (QID) | OROMUCOSAL | Status: DC
  Administered 2015-06-20 – 2015-06-26 (×22): 7 mL via OROMUCOSAL

## 2015-06-20 MED ORDER — LIDOCAINE HCL (CARDIAC) 20 MG/ML IV SOLN
INTRAVENOUS | Status: AC
  Filled 2015-06-20: qty 5

## 2015-06-20 MED ORDER — PANTOPRAZOLE SODIUM 40 MG IV SOLR
40.0000 mg | INTRAVENOUS | Status: AC
  Administered 2015-06-20: 40 mg via INTRAVENOUS
  Filled 2015-06-20: qty 40

## 2015-06-20 MED ORDER — SODIUM CHLORIDE 0.9 % IV SOLN
250.0000 mL | INTRAVENOUS | Status: DC | PRN
  Administered 2015-06-21: 250 mL via INTRAVENOUS

## 2015-06-20 MED ORDER — SODIUM CHLORIDE 0.9 % IV SOLN
INTRAVENOUS | Status: DC
  Administered 2015-06-20: 08:00:00 via INTRAVENOUS

## 2015-06-20 MED ORDER — FENTANYL CITRATE (PF) 100 MCG/2ML IJ SOLN
25.0000 ug | INTRAMUSCULAR | Status: DC | PRN
  Administered 2015-06-20 – 2015-06-22 (×8): 50 ug via INTRAVENOUS
  Administered 2015-06-23 (×2): 100 ug via INTRAVENOUS
  Administered 2015-06-24: 50 ug via INTRAVENOUS
  Filled 2015-06-20 (×12): qty 2

## 2015-06-20 MED ORDER — TAMSULOSIN HCL 0.4 MG PO CAPS
0.4000 mg | ORAL_CAPSULE | Freq: Every day | ORAL | Status: DC
  Administered 2015-06-21 – 2015-07-12 (×14): 0.4 mg via ORAL
  Filled 2015-06-20 (×20): qty 1

## 2015-06-20 MED ORDER — PROMETHAZINE HCL 25 MG/ML IJ SOLN
25.0000 mg | Freq: Once | INTRAMUSCULAR | Status: AC
  Administered 2015-06-20: 25 mg via INTRAVENOUS
  Filled 2015-06-20: qty 1

## 2015-06-20 MED ORDER — ONDANSETRON HCL 4 MG/2ML IJ SOLN
4.0000 mg | Freq: Four times a day (QID) | INTRAMUSCULAR | Status: DC | PRN
  Administered 2015-06-27 – 2015-07-05 (×8): 4 mg via INTRAVENOUS
  Filled 2015-06-20 (×8): qty 2

## 2015-06-20 MED ORDER — SODIUM CHLORIDE 0.9 % IV SOLN
150.0000 mg | Freq: Two times a day (BID) | INTRAVENOUS | Status: DC
  Administered 2015-06-20 – 2015-07-12 (×44): 150 mg via INTRAVENOUS
  Filled 2015-06-20 (×58): qty 15

## 2015-06-20 MED ORDER — METOCLOPRAMIDE HCL 5 MG/ML IJ SOLN: 10.0000 mg | INTRAMUSCULAR | Status: DC

## 2015-06-20 MED ORDER — ROCURONIUM BROMIDE 50 MG/5ML IV SOLN
INTRAVENOUS | Status: AC
  Filled 2015-06-20: qty 2

## 2015-06-20 MED ORDER — CLINDAMYCIN PHOSPHATE 600 MG/50ML IV SOLN: 600.0000 mg | Freq: Once | INTRAVENOUS | Status: DC

## 2015-06-20 MED ORDER — ONDANSETRON HCL 4 MG/2ML IJ SOLN
INTRAMUSCULAR | Status: AC
  Administered 2015-06-20: 4 mg via INTRAVENOUS
  Filled 2015-06-20: qty 2

## 2015-06-20 MED ORDER — ROCURONIUM BROMIDE 50 MG/5ML IV SOLN
1.0000 mg/kg | Freq: Once | INTRAVENOUS | Status: AC
  Administered 2015-06-20: 52.2 mg via INTRAVENOUS
  Filled 2015-06-20: qty 5.22

## 2015-06-20 MED ORDER — CHLORHEXIDINE GLUCONATE 0.12% ORAL RINSE (MEDLINE KIT)
15.0000 mL | Freq: Two times a day (BID) | OROMUCOSAL | Status: DC
  Administered 2015-06-20 – 2015-06-26 (×12): 15 mL via OROMUCOSAL

## 2015-06-20 MED ORDER — SODIUM CHLORIDE 0.9 % IV SOLN
8.0000 mg/h | INTRAVENOUS | Status: DC
  Administered 2015-06-20: 8 mg/h via INTRAVENOUS
  Filled 2015-06-20 (×3): qty 80

## 2015-06-20 MED ORDER — BACLOFEN 5 MG HALF TABLET
5.0000 mg | ORAL_TABLET | Freq: Three times a day (TID) | ORAL | Status: DC
  Administered 2015-06-20 – 2015-07-12 (×53): 5 mg via ORAL
  Filled 2015-06-20 (×58): qty 1

## 2015-06-20 MED ORDER — PANTOPRAZOLE SODIUM 40 MG IV SOLR
40.0000 mg | Freq: Two times a day (BID) | INTRAVENOUS | Status: DC
  Administered 2015-06-20 – 2015-07-12 (×44): 40 mg via INTRAVENOUS
  Filled 2015-06-20 (×46): qty 40

## 2015-06-20 MED ORDER — ONDANSETRON HCL 4 MG/2ML IJ SOLN
4.0000 mg | Freq: Once | INTRAMUSCULAR | Status: AC
  Administered 2015-06-20: 4 mg via INTRAVENOUS

## 2015-06-20 MED ORDER — CYCLOSPORINE 0.05 % OP EMUL
1.0000 [drp] | Freq: Two times a day (BID) | OPHTHALMIC | Status: DC
  Administered 2015-06-20 – 2015-07-12 (×43): 1 [drp] via OPHTHALMIC
  Filled 2015-06-20 (×55): qty 1

## 2015-06-20 MED ORDER — ETOMIDATE 2 MG/ML IV SOLN
INTRAVENOUS | Status: AC
  Filled 2015-06-20: qty 20

## 2015-06-20 NOTE — ED Notes (Signed)
Night staff reported they were unable to play NG tube, OG tube placed before day shift arrived, unsure if OG tube is far enough down the esophagus, pt keeps drooping, secretions are clear and mixed with dark brown liquid.

## 2015-06-20 NOTE — ED Notes (Addendum)
Pt kept gagging on tube, eventually propofol titrated to , then BP started to decrease, 1 bag NS bolus started at 0359 still infusing, increased rate of infusion.   Propofol decreased to 40 mcg   Critical care being paged.   NS at 120ml/hr hung to infuse abx, bolus from bag was started after speaking with critical care.

## 2015-06-20 NOTE — H&P (Signed)
PULMONARY / CRITICAL CARE MEDICINE   Name: Colin Bradley MRN: [id number] DOB: 11-26-1950    ADMISSION DATE:  06/20/2015   REFERRING MD :  EDP  CHIEF COMPLAINT:  Nausea   INITIAL PRESENTATION: Vomiting Bradley  STUDIES:    SIGNIFICANT EVENTS: 9/14 gib   HISTORY OF PRESENT ILLNESS:   64 yo WM with existing CP/MR with a history of HTN, erosive esophagitis, previous GI bleed who presented to [facility] from SNF coffee ground emesis and inability to protect airway and maintain adequate O2 saturations. He was intubated per EDP, started on PPI drip and GI was consulted. He is a ward of state and is a full code.  State appt guardian is Colin Bradley DDS [phone]. He will be admitted to ICU under PCCM service.  PAST MEDICAL HISTORY :   has a past medical history of Seizures; Cerebral palsy; Hypertension; Mental retardation; Depression; Nephrolithiasis; BPH (benign prostatic hyperplasia); GI bleed (2009); Esophageal stricture (2009); and Depression with anxiety.  has past surgical history that includes Excision basal cell carcinoma (2002); Squamous cell carcinoma excision (2004); bowen dz (2002); Esophagogastroduodenoscopy (06/14/2012); and Multiple extractions with alveoloplasty (N/A, 06/20/2014). Prior to Admission medications   Medication Sig Start Date End Date Taking? Authorizing Provider  albuterol (PROVENTIL) (2.5 MG/3ML) 0.083% nebulizer solution Take 3 mLs (2.5 mg total) by nebulization every 6 (six) hours as needed for wheezing or shortness of breath. 11/07/14  Yes Richarda Overlie, MD  baclofen (LIORESAL) 10 MG tablet Take 5 mg by mouth 3 (three) times daily.   Yes Historical Provider, MD  cloNIDine (CATAPRES) 0.2 MG tablet Take 1 tablet (0.2 mg total) by mouth 2 (two) times daily. 11/07/14  Yes Richarda Overlie, MD  cycloSPORINE (RESTASIS) 0.05 % ophthalmic emulsion Place 1 drop into both eyes 2 (two) times daily.   Yes Historical Provider, MD  dutasteride (AVODART) 0.5 MG capsule Take 0.5 mg by  mouth daily.   Yes Historical Provider, MD  guaiFENesin (ROBITUSSIN) 100 MG/5ML SOLN Take 10 mLs by mouth 3 (three) times daily. For 10 days 10/27/14  Yes Historical Provider, MD  Lacosamide (VIMPAT) 150 MG TABS Take 150 mg by mouth 2 (two) times daily.   Yes Historical Provider, MD  lisinopril (PRINIVIL,ZESTRIL) 30 MG tablet Take 1 tablet (30 mg total) by mouth daily with breakfast. Patient taking differently: Take 30 mg by mouth daily.  11/07/14  Yes Richarda Overlie, MD  LORazepam (ATIVAN) 0.5 MG tablet Take 0.5 mg by mouth every 8 (eight) hours.   Yes Historical Provider, MD  metoprolol (LOPRESSOR) 50 MG tablet Take 1 tablet (50 mg total) by mouth 2 (two) times daily. 12/07/14  Yes Ripudeep Jenna Luo, MD  Multiple Vitamin (DAILY VITE PO) Take 1 tablet by mouth daily with breakfast.    Yes Historical Provider, MD  ondansetron (ZOFRAN) 4 MG tablet Take 4 mg by mouth 3 (three) times daily as needed for nausea or vomiting.   Yes Historical Provider, MD  potassium chloride 20 MEQ TBCR Take 20 mEq by mouth daily. 11/07/14  Yes Richarda Overlie, MD  prazosin (MINIPRESS) 2 MG capsule Take 2 mg by mouth at bedtime.    Yes Historical Provider, MD  promethazine (PHENERGAN) 12.5 MG tablet Take 12.5 mg by mouth every 12 (twelve) hours as needed for nausea or vomiting.   Yes Historical Provider, MD  QUEtiapine (SEROQUEL) 25 MG tablet Take 25 mg by mouth at bedtime.   Yes Historical Provider, MD  ranitidine (ZANTAC) 150 MG tablet Take 150 mg by  mouth 2 (two) times daily.   Yes Historical Provider, MD  sucralfate (CARAFATE) 1 GM/10ML suspension Take 1 g by mouth 4 (four) times daily -  with meals and at bedtime.    Yes Historical Provider, MD  tamsulosin (FLOMAX) 0.4 MG CAPS capsule Take 0.4 mg by mouth daily.   Yes Historical Provider, MD  amoxicillin-clavulanate (AUGMENTIN) 875-125 MG per tablet Take 1 tablet by mouth 2 (two) times daily. X 4 more days Patient not taking: Reported on 06/20/2015 12/07/14   Ripudeep Jenna Luo, MD   Dutasteride-Tamsulosin HCl (JALYN) 0.5-0.4 MG CAPS Take 1 tablet by mouth daily with breakfast.     Historical Provider, MD  escitalopram (LEXAPRO) 20 MG tablet Take 20 mg by mouth daily with breakfast.     Historical Provider, MD  food thickener (THICK IT) POWD Take 1 g by mouth 2 (two) times daily.    Historical Provider, MD  fosfomycin (MONUROL) 3 G PACK Take 3 g by mouth once. Please give 1 tab on 12/09/2014 Patient not taking: Reported on 06/20/2015 12/09/14   Ripudeep K Rai, MD  LORazepam (ATIVAN) 1 MG tablet Take 1 tablet (1 mg total) by mouth every 8 (eight) hours as needed for anxiety. Patient not taking: Reported on 06/20/2015 12/07/14   Ripudeep Jenna Luo, MD  Maltodextrin-Xanthan Gum (RESOURCE THICKENUP CLEAR) POWD 2 times a day with meals Patient not taking: Reported on 12/02/2014 11/07/14   Richarda Overlie, MD  saccharomyces boulardii (FLORASTOR) 250 MG capsule Take 1 capsule (250 mg total) by mouth 2 (two) times daily. While on antibiotics Patient not taking: Reported on 06/20/2015 12/07/14   Ripudeep Jenna Luo, MD   No Known Allergies  FAMILY HISTORY:  has no family status information on file.  SOCIAL HISTORY:  reports that he has never smoked. He does not have any smokeless tobacco history on file. He reports that he does not drink alcohol or use illicit drugs.  REVIEW OF SYSTEMS: na  SUBJECTIVE:   VITAL SIGNS: Temp:  [95.5 F (35.3 C)-100.6 F (38.1 C)] 100.1 F (37.8 C) (09/13 0715) Pulse Rate:  [88-118] 97 (09/13 0715) Resp:  [12-31] 22 (09/13 0715) BP: (90-158)/(50-114) 109/66 mmHg (09/13 0715) SpO2:  [82 %-99 %] 99 % (09/13 0715) FiO2 (%):  [100 %] 100 % (09/13 0601) Weight:  [115 lb (52.164 kg)-115 lb 1.3 oz (52.2 kg)] 115 lb (52.164 kg) (09/13 0527) HEMODYNAMICS:   VENTILATOR SETTINGS: Vent Mode:  [-] PRVC FiO2 (%):  [100 %] 100 % Set Rate:  [14 bmp] 14 bmp Vt Set:  [530 mL] 530 mL PEEP:  [5 cmH20] 5 cmH20 Plateau Pressure:  [23 cmH20] 23 cmH20 INTAKE / OUTPUT: No intake  or output data in the 24 hours ending 06/20/15 0730  PHYSICAL EXAMINATION: General:  Sedated on vent, frail CP pt with contractures Neuro:  Sedated  HEENT: No jvd/LAN, poor dentition  Cardiovascular: HSR RRR Lungs:  Coarse rhonchi bilateral  Abdomen:  Soft + bs Musculoskeletal:  Contracted  Skin:  warm  LABS:  CBC  Recent Labs Lab 06/20/15 0115 06/20/15 0130 06/20/15 0359 06/20/15 0657  WBC 7.0  --  6.2 6.1  HGB 12.4* 13.3 10.6* 10.6*  HCT 37.3* 39.0 31.6* 31.2*  PLT 235  --  190 160   Coag's No results for input(s): APTT, INR in the last 168 hours. BMET  Recent Labs Lab 06/20/15 0115 06/20/15 0130  NA 137 138  K 5.5* 5.5*  CL 110 111  CO2 19*  --  BUN 40* 43*  CREATININE 1.43* 1.40*  GLUCOSE 105* 103*   Electrolytes  Recent Labs Lab 06/20/15 0115  CALCIUM 9.3   Sepsis Markers  Recent Labs Lab 06/20/15 0125  LATICACIDVEN 2.17*   ABG  Recent Labs Lab 06/20/15 0315 06/20/15 0650  PHART 7.286* 7.283*  PCO2ART 31.8* 31.9*  PO2ART 80.9 126*   Liver Enzymes  Recent Labs Lab 06/20/15 0115  AST 28  ALT 26  ALKPHOS 96  BILITOT 0.4  ALBUMIN 3.7   Cardiac Enzymes No results for input(s): TROPONINI, PROBNP in the last 168 hours. Glucose No results for input(s): GLUCAP in the last 168 hours.  Imaging Dg Chest Port 1 View  06/20/2015   CLINICAL DATA:  Intubation.  EXAM: PORTABLE CHEST - 1 VIEW  COMPARISON:  Earlier this day at 0148 hour  FINDINGS: 0609 hour: Endotracheal tube is 3.2 cm from the carina. Enteric tube in place, tip below the diaphragm not included in the field of view. Progressive opacities in the right mid lower lung zone. Developing left basilar opacity. Cardiomediastinal contours are partially obscured. Question developing right pleural effusion. No pneumothorax.  IMPRESSION: 1. Endotracheal tube in place. Enteric tube tip below the diaphragm, not included in the field of view. 2. Progressive opacity in the right mid lower lung  zone, may reflect worsening pneumonia, atelectasis, or developing right pleural effusion. Increasing left basilar opacity, concerning for atelectasis. Alternatively, aspiration could have this appearance.   Electronically Signed   By: Rubye Oaks M.D.   On: 06/20/2015 06:52   Dg Chest Port 1 View  06/20/2015   CLINICAL DATA:  64 year old male with vomiting  EXAM: PORTABLE ABDOMEN - 1 VIEW; PORTABLE CHEST - 1 VIEW  COMPARISON:  Chest radiograph dated 227 views abdominal radiograph dated 2283 and  FINDINGS: Single portable view of the chest demonstrate patchy areas of nodular and ground-glass opacity in the mid to lower lung field on the right most compatible with pneumonia. A small right pleural effusion may be present. The left lung is clear. No pneumothorax. The cardiac silhouette is within normal limits.  Single-view of the abdomen demonstrate air within the stomach. No dilated small bowel loops identified. The no free air. Copious amount of stool noted throughout the colon and rectum. No radiopaque calculi identified there is  There is degenerative changes of spine.  No acute fracture.  IMPRESSION: Right mid to lower lung field opacities most compatible with pneumonia. Clinical correlation and follow-up recommended.  Constipation with possible fecal impaction within the rectum. No bowel obstruction.   Electronically Signed   By: Elgie Collard M.D.   On: 06/20/2015 02:31   Dg Abd Portable 1v  06/20/2015   CLINICAL DATA:  64 year old male with vomiting  EXAM: PORTABLE ABDOMEN - 1 VIEW; PORTABLE CHEST - 1 VIEW  COMPARISON:  Chest radiograph dated 227 views abdominal radiograph dated 2283 and  FINDINGS: Single portable view of the chest demonstrate patchy areas of nodular and ground-glass opacity in the mid to lower lung field on the right most compatible with pneumonia. A small right pleural effusion may be present. The left lung is clear. No pneumothorax. The cardiac silhouette is within normal  limits.  Single-view of the abdomen demonstrate air within the stomach. No dilated small bowel loops identified. The no free air. Copious amount of stool noted throughout the colon and rectum. No radiopaque calculi identified there is  There is degenerative changes of spine.  No acute fracture.  IMPRESSION: Right mid to lower lung  field opacities most compatible with pneumonia. Clinical correlation and follow-up recommended.  Constipation with possible fecal impaction within the rectum. No bowel obstruction.   Electronically Signed   By: Elgie Collard M.D.   On: 06/20/2015 02:31     ASSESSMENT / PLAN:  PULMONARY OETT 9/14>> A: VDRF secondary to aspiration in setting of GIB, inabilty to protect airway and worsening hypoxia P:   Vent bundle BD Empiric abx   CARDIOVASCULAR CVL  A: Abn EKG P:  Serial CE Follow 12 lead  RENAL GU A:  ARI Lab Results  Component Value Date   CREATININE 1.40* 06/20/2015   CREATININE 1.43* 06/20/2015   CREATININE 0.90 12/07/2014   BPH P:   Hydration Trend creatine Renal US if no improvement Foley Trend lactic acid  GASTROINTESTINAL A:   GIB with hx of GIB P:   PPI drip GI consult H2 blockers Check coags   HEMATOLOGIC A:   Monitor for Bradley loss anemia from GIB P:   Recent Labs  06/20/15 0359 06/20/15 0657  HGB 10.6* 10.6*     INFECTIOUS A:   No acute issue Possible aspiration P:   BCx2 9/14>> UC 9/14>> Sputum 9/14>> Abx: 9/14 unasyn>>  ENDOCRINE A:   No acute issues   P:     NEUROLOGIC A:   CP MR Sedated on vent with diprivan P:   RASS goal: -1 Assess mental status when not sedated. He is non ambulatory judging by lower ext conractures   FAMILY  - Updates: None at bedside, ward of state.  - Inter-disciplinary family meet or Palliative Care meeting due by:  day 7    TODAY'S SUMMARY:   64 yo WM with existing CP/MR with a history of HTN, erosive esophagitis, previous GI bleed who presented to  [facility] from SNF coffee ground emesis and inability to protect airway and maintain adequate O2 saturations. He was intubated per EDP, started on PPI drip and GI was consulted. He is a ward of state and is a full code.  State appt guardian is Colin Bradley DDS [phone]. He will be admitted to ICU under PCCM service.  Colin Bradley General ACNP [facility] PCCM Pager [phone] till 3 pm If no answer page [phone] 06/20/2015, 7:35 AM

## 2015-06-20 NOTE — ED Provider Notes (Signed)
CSN: [id number]     Arrival date & time 06/20/15  0050 History   First MD Initiated Contact with Patient 06/20/15 0121     Chief Complaint  Patient presents with  . Emesis    LEVEL 5 CAVEAT SECONDARY TO MENTAL RETARDATION; PATIENT LARGELY NONVERBAL  (Consider location/radiation/quality/duration/timing/severity/associated sxs/prior Treatment) HPI Comments: Patient is a 64 year old male with a history of cerebral palsy, mental retardation, hypertension, erosive esophagitis causing UGI bleed in 2009, esophageal stricture, and BPH who presents to the emergency department for coffee-ground emesis. Patient presents from [facility]. [facility] reports onset of symptoms this evening. They report a small amount of watery stool today. Patient actively vomiting black emesis in ED.  Patient is FULL CODE Medical Guardian is Moises Blood of [employer] - [phone] After hours contact [phone]  Patient is a 64 y.o. male presenting with vomiting. The history is provided by the EMS personnel and medical records. No language interpreter was used.  Emesis   Past Medical History  Diagnosis Date  . Seizures   . Cerebral palsy   . Hypertension   . Mental retardation   . Depression   . Nephrolithiasis   . BPH (benign prostatic hyperplasia)   . GI bleed 2009    coffee ground emesis, erosive esophagitis on EGD by Dr Juanda Chance  . Esophageal stricture 2009    not dilated during the EGD, biopsy benign:  no Barretts  . Depression with anxiety    Past Surgical History  Procedure Laterality Date  . Basal cell carcinoma excision  2002    forehead  . Squamous cell carcinoma excision  2004    lip  . Bowen dz  2002    right cheek   . Esophagogastroduodenoscopy  06/14/2012    Procedure: ESOPHAGOGASTRODUODENOSCOPY (EGD);  Surgeon: Rachael Fee, MD;  Location: [facility] ENDOSCOPY;  Service: Endoscopy;  Laterality: N/A;  . Multiple extractions with alveoloplasty N/A 06/20/2014    Procedure: MULTIPLE  EXTRACTIONS TEETH NUMBER TWO, TEN, TWELVE, EIGHTEEN, TWENTY-ONE, TWENTY-TWO, TWENTY-NINE, THIRTY-TWO;  Surgeon: Georgia Lopes, DDS;  Location: [facility] OR;  Service: Oral Surgery;  Laterality: N/A;   History reviewed. No pertinent family history. Social History  Substance Use Topics  . Smoking status: Never Smoker   . Smokeless tobacco: None  . Alcohol Use: No    Review of Systems  Unable to perform ROS: Patient nonverbal  Gastrointestinal: Positive for vomiting.    Allergies  Review of patient's allergies indicates no known allergies.  Home Medications   Prior to Admission medications   Medication Sig Start Date End Date Taking? Authorizing Provider  albuterol (PROVENTIL) (2.5 MG/3ML) 0.083% nebulizer solution Take 3 mLs (2.5 mg total) by nebulization every 6 (six) hours as needed for wheezing or shortness of breath. 11/07/14  Yes Richarda Overlie, MD  baclofen (LIORESAL) 10 MG tablet Take 5 mg by mouth 3 (three) times daily.   Yes Historical Provider, MD  cloNIDine (CATAPRES) 0.2 MG tablet Take 1 tablet (0.2 mg total) by mouth 2 (two) times daily. 11/07/14  Yes Richarda Overlie, MD  cycloSPORINE (RESTASIS) 0.05 % ophthalmic emulsion Place 1 drop into both eyes 2 (two) times daily.   Yes Historical Provider, MD  dutasteride (AVODART) 0.5 MG capsule Take 0.5 mg by mouth daily.   Yes Historical Provider, MD  guaiFENesin (ROBITUSSIN) 100 MG/5ML SOLN Take 10 mLs by mouth 3 (three) times daily. For 10 days 10/27/14  Yes Historical Provider, MD  Lacosamide (VIMPAT) 150 MG TABS Take 150 mg by mouth  2 (two) times daily.   Yes Historical Provider, MD  lisinopril (PRINIVIL,ZESTRIL) 30 MG tablet Take 1 tablet (30 mg total) by mouth daily with breakfast. Patient taking differently: Take 30 mg by mouth daily.  11/07/14  Yes Richarda Overlie, MD  LORazepam (ATIVAN) 0.5 MG tablet Take 0.5 mg by mouth every 8 (eight) hours.   Yes Historical Provider, MD  metoprolol (LOPRESSOR) 50 MG tablet Take 1 tablet (50 mg total) by  mouth 2 (two) times daily. 12/07/14  Yes Ripudeep Jenna Luo, MD  Multiple Vitamin (DAILY VITE PO) Take 1 tablet by mouth daily with breakfast.    Yes Historical Provider, MD  ondansetron (ZOFRAN) 4 MG tablet Take 4 mg by mouth 3 (three) times daily as needed for nausea or vomiting.   Yes Historical Provider, MD  potassium chloride 20 MEQ TBCR Take 20 mEq by mouth daily. 11/07/14  Yes Richarda Overlie, MD  prazosin (MINIPRESS) 2 MG capsule Take 2 mg by mouth at bedtime.    Yes Historical Provider, MD  promethazine (PHENERGAN) 12.5 MG tablet Take 12.5 mg by mouth every 12 (twelve) hours as needed for nausea or vomiting.   Yes Historical Provider, MD  QUEtiapine (SEROQUEL) 25 MG tablet Take 25 mg by mouth at bedtime.   Yes Historical Provider, MD  ranitidine (ZANTAC) 150 MG tablet Take 150 mg by mouth 2 (two) times daily.   Yes Historical Provider, MD  sucralfate (CARAFATE) 1 GM/10ML suspension Take 1 g by mouth 4 (four) times daily -  with meals and at bedtime.    Yes Historical Provider, MD  tamsulosin (FLOMAX) 0.4 MG CAPS capsule Take 0.4 mg by mouth daily.   Yes Historical Provider, MD  amoxicillin-clavulanate (AUGMENTIN) 875-125 MG per tablet Take 1 tablet by mouth 2 (two) times daily. X 4 more days Patient not taking: Reported on 06/20/2015 12/07/14   Ripudeep Jenna Luo, MD  Dutasteride-Tamsulosin HCl (JALYN) 0.5-0.4 MG CAPS Take 1 tablet by mouth daily with breakfast.     Historical Provider, MD  escitalopram (LEXAPRO) 20 MG tablet Take 20 mg by mouth daily with breakfast.     Historical Provider, MD  food thickener (THICK IT) POWD Take 1 g by mouth 2 (two) times daily.    Historical Provider, MD  fosfomycin (MONUROL) 3 G PACK Take 3 g by mouth once. Please give 1 tab on 12/09/2014 Patient not taking: Reported on 06/20/2015 12/09/14   Ripudeep K Rai, MD  LORazepam (ATIVAN) 1 MG tablet Take 1 tablet (1 mg total) by mouth every 8 (eight) hours as needed for anxiety. Patient not taking: Reported on 06/20/2015 12/07/14    Ripudeep Jenna Luo, MD  Maltodextrin-Xanthan Gum (RESOURCE THICKENUP CLEAR) POWD 2 times a day with meals Patient not taking: Reported on 12/02/2014 11/07/14   Richarda Overlie, MD  saccharomyces boulardii (FLORASTOR) 250 MG capsule Take 1 capsule (250 mg total) by mouth 2 (two) times daily. While on antibiotics Patient not taking: Reported on 06/20/2015 12/07/14   Ripudeep K Rai, MD   BP 145/66 mmHg  Pulse 88  Temp(Src) 95.5 F (35.3 C) (Rectal)  Resp 31  SpO2 88%   Physical Exam  Constitutional: He appears well-developed and well-nourished. No distress.  Patient ill appearing and slightly pale  HENT:  Head: Normocephalic and atraumatic.  Eyes: Conjunctivae and EOM are normal. No scleral icterus.  Neck: Normal range of motion.  Cardiovascular: Normal rate, regular rhythm and intact distal pulses.   Pulmonary/Chest: Effort normal. No respiratory distress. He has no  wheezes.  Decreased BS in b/l bases. Chest expansion symmetric. No wheezes. Mild tachypnea. SpO2 88% on RA.  Abdominal: He exhibits no distension.  Rigid, nondistended abdomen. No masses palpated. Black emesis noted at bedside. No gross blood.  Musculoskeletal: Normal range of motion.  Neurological: He is alert.  Patient alert  Skin: Skin is warm and dry. No rash noted. He is not diaphoretic. No erythema.  Psychiatric: He has a normal mood and affect. His behavior is normal.  Nursing note and vitals reviewed.   ED Course  Procedures (including critical care time) Labs Review Labs Reviewed  COMPREHENSIVE METABOLIC PANEL - Abnormal; Notable for the following:    Potassium 5.5 (*)    CO2 19 (*)    Glucose, Bld 105 (*)    BUN 40 (*)    Creatinine, Ser 1.43 (*)    GFR calc non Af Amer 50 (*)    GFR calc Af Amer 58 (*)    All other components within normal limits  CBC - Abnormal; Notable for the following:    RBC 3.85 (*)    Hemoglobin 12.4 (*)    HCT 37.3 (*)    All other components within normal limits  DIFFERENTIAL -  Abnormal; Notable for the following:    Neutrophils Relative % 83 (*)    All other components within normal limits  BLOOD GAS, ARTERIAL - Abnormal; Notable for the following:    pH, Arterial 7.286 (*)    pCO2 arterial 31.8 (*)    Bicarbonate 14.7 (*)    Acid-base deficit 10.6 (*)    All other components within normal limits  OCCULT BLOOD GASTRIC / DUODENUM (SPECIMEN CUP) - Abnormal; Notable for the following:    Occult Blood, Gastric POSITIVE (*)    All other components within normal limits  I-STAT CG4 LACTIC ACID, ED - Abnormal; Notable for the following:    Lactic Acid, Venous 2.17 (*)    All other components within normal limits  I-STAT CHEM 8, ED - Abnormal; Notable for the following:    Potassium 5.5 (*)    BUN 43 (*)    Creatinine, Ser 1.40 (*)    Glucose, Bld 103 (*)    All other components within normal limits  LIPASE, BLOOD  URINALYSIS, ROUTINE W REFLEX MICROSCOPIC (NOT AT [facility])  CBC WITH DIFFERENTIAL/PLATELET  POC OCCULT BLOOD, ED  TYPE AND SCREEN    Imaging Review Dg Chest Port 1 View  06/20/2015   CLINICAL DATA:  64 year old male with vomiting  EXAM: PORTABLE ABDOMEN - 1 VIEW; PORTABLE CHEST - 1 VIEW  COMPARISON:  Chest radiograph dated 227 views abdominal radiograph dated 2283 and  FINDINGS: Single portable view of the chest demonstrate patchy areas of nodular and ground-glass opacity in the mid to lower lung field on the right most compatible with pneumonia. A small right pleural effusion may be present. The left lung is clear. No pneumothorax. The cardiac silhouette is within normal limits.  Single-view of the abdomen demonstrate air within the stomach. No dilated small bowel loops identified. The no free air. Copious amount of stool noted throughout the colon and rectum. No radiopaque calculi identified there is  There is degenerative changes of spine.  No acute fracture.  IMPRESSION: Right mid to lower lung field opacities most compatible with pneumonia. Clinical  correlation and follow-up recommended.  Constipation with possible fecal impaction within the rectum. No bowel obstruction.   Electronically Signed   By: Elgie Collard M.D.   On:  06/20/2015 02:31   Dg Abd Portable 1v  06/20/2015   CLINICAL DATA:  63 year old male with vomiting  EXAM: PORTABLE ABDOMEN - 1 VIEW; PORTABLE CHEST - 1 VIEW  COMPARISON:  Chest radiograph dated 227 views abdominal radiograph dated 2283 and  FINDINGS: Single portable view of the chest demonstrate patchy areas of nodular and ground-glass opacity in the mid to lower lung field on the right most compatible with pneumonia. A small right pleural effusion may be present. The left lung is clear. No pneumothorax. The cardiac silhouette is within normal limits.  Single-view of the abdomen demonstrate air within the stomach. No dilated small bowel loops identified. The no free air. Copious amount of stool noted throughout the colon and rectum. No radiopaque calculi identified there is  There is degenerative changes of spine.  No acute fracture.  IMPRESSION: Right mid to lower lung field opacities most compatible with pneumonia. Clinical correlation and follow-up recommended.  Constipation with possible fecal impaction within the rectum. No bowel obstruction.   Electronically Signed   By: Elgie Collard M.D.   On: 06/20/2015 02:31     I have personally reviewed and evaluated these images and lab results as part of my medical decision-making.   EKG Interpretation   Date/Time:  Tuesday June 20 2015 01:34:46 EDT Ventricular Rate:  84 PR Interval:  253 QRS Duration: 156 QT Interval:  414 QTC Calculation: 489 R Axis:   -46 Text Interpretation:  Sinus rhythm Prolonged PR interval RBBB and LAFB No  old tracing to compare Confirmed by Rhunette Croft, MD, Janey Genta [phone]) on  06/20/2015 2:17:07 AM       CRITICAL CARE Performed by: Antony Madura   Total critical care time: 45  Critical care time was exclusive of separately billable  procedures and treating other patients.  Critical care was necessary to treat or prevent imminent or life-threatening deterioration.  Critical care was time spent personally by me on the following activities: development of treatment plan with patient and/or surrogate as well as nursing, discussions with consultants, evaluation of patient's response to treatment, examination of patient, obtaining history from patient or surrogate, ordering and performing treatments and interventions, ordering and review of laboratory studies, ordering and review of radiographic studies, pulse oximetry and re-evaluation of patient's condition.   Medications  metoCLOPramide (REGLAN) injection 10 mg (10 mg Intravenous Given 06/20/15 0217)  pantoprazole (PROTONIX) 80 mg in sodium chloride 0.9 % 250 mL (0.32 mg/mL) infusion (8 mg/hr Intravenous New Bag/Given 06/20/15 0607)  lidocaine (cardiac) 100 mg/34ml (XYLOCAINE) 20 MG/ML injection 2% (not administered)  succinylcholine (ANECTINE) 20 MG/ML injection (not administered)  rocuronium (ZEMURON) 50 MG/5ML injection (not administered)  etomidate (AMIDATE) 2 MG/ML injection (not administered)  propofol (DIPRIVAN) 1000 MG/100ML infusion (not administered)  ondansetron (ZOFRAN) injection 4 mg (4 mg Intravenous Given 06/20/15 0148)  pantoprazole (PROTONIX) injection 40 mg (40 mg Intravenous Given 06/20/15 0125)  sodium chloride 0.9 % bolus 2,000 mL (0 mLs Intravenous Stopped 06/20/15 0250)  promethazine (PHENERGAN) injection 25 mg (25 mg Intravenous Given 06/20/15 0248)  sodium chloride 0.9 % bolus 1,000 mL (1,000 mLs Intravenous New Bag/Given 06/20/15 0359)  rocuronium (ZEMURON) injection 52.2 mg (52.2 mg Intravenous Given 06/20/15 0551)  etomidate (AMIDATE) injection 20 mg (20 mg Intravenous Given 06/20/15 0550)    MDM   Final diagnoses:  Acute upper GI bleed  Metabolic acidosis  Pneumonitis    64 y/o male with hx of MR and cerebral palsy presents from [facility] for hematemesis. Patient  witnessed to have black emesis in the ED requiring suctioning. Hemoccult negative. Gastric occult positive. Patient with 2 point drop in Hbg since arrival. BUN and creatinine elevated from baseline. CXR also concerning for aspiration, likely contributing to initial hypoxia. Patient now satting well with NRB. Dr. Adela Lank of [facility] made aware; patient has seen [place] in the past for similar symptoms. Case discussed with Dr. Sharyon Medicus; TRH to admit to Step Down.   Filed Vitals:   06/20/15 0300 06/20/15 0330 06/20/15 0351 06/20/15 0400  BP: 110/69 90/61 100/57 96/50  Pulse: 97 95 104 97  Temp:      TempSrc:      Resp: 26 19 19 18   SpO2: 95% 98% 95% 97%     Antony Madura, PA-C 06/20/15 0448   6578 - Case discussed with Dr. Sharyon Medicus and my attending, Dr. Rhunette Croft. Decision was made to intubate the patient for airway protection, especially in light of his MR, and to assist in improved ventilation as he is requiring NRB to prevent hypoxia associated with likely pneumonitis.  0600 - Patient intubated. Will consult PCCM.  [phone] - Case discussed with Dr. Molli Knock of Critical Care. PCCM to admit. They state it will be approximately 1 hour until he is able to be evaluated in the ED.   Filed Vitals:   06/20/15 0430 06/20/15 0500 06/20/15 0527 06/20/15 0601  BP: 104/69 112/67  144/83  Pulse: 102 104  114  Temp:      TempSrc:      Resp: 24 21  14   Height:   5\' 7"  (1.702 m)   Weight:  115 lb 1.3 oz (52.2 kg) 115 lb (52.164 kg)   SpO2: 99% 96%  98%     Antony Madura, PA-C 06/20/15 2952  Derwood Kaplan, MD 06/20/15 [phone]

## 2015-06-20 NOTE — ED Notes (Signed)
4098: preparing pt for mechanical intubation, staff present: Dr Rhunette Croft, PA Jobe Gibbon, RN Tiffany, RN Kallie Locks, RT Misty Stanley Medication given: see [facility] Procedure ended at 0602 Temp foley placed Pt on propofol for sedation management, VSS

## 2015-06-20 NOTE — Progress Notes (Addendum)
eLink Physician-Brief Progress Note Patient Name: LOTHAR PREHN DOB: 1951/08/24 MRN: [id number]   Date of Service  06/20/2015  HPI/Events of Note  K 4.9, Hgb and trop ok  eICU Interventions  monitor     Intervention Category Major Interventions: Electrolyte abnormality - evaluation and management  Shan Levans 06/20/2015, 7:43 PM

## 2015-06-20 NOTE — ED Notes (Signed)
Critical care at bedside  

## 2015-06-20 NOTE — ED Notes (Signed)
Spoke with critical care McQuaid, pt to get 500 bolus and prn order of fentanyl for sedation. Propofol will stay at .   McQuaid requesting pt be transported to ICU as soon as possible. rn checked with bed manager, there are no ICU beds at this time.

## 2015-06-20 NOTE — ED Notes (Signed)
GI at bedside

## 2015-06-20 NOTE — Consult Note (Signed)
Referring MD: Critical Care Medicine Primary GI: Lina Sar, MD Reason for referral: GI bleed   HPI :  Patient is a 64 year old male with cerebral palsy brought in from [facility] home for hematemesis.Patient known to [place] for a history of GERD / erosive esophagitis.  He had several episodes of coffee ground emesis in ED during the night. Scant amount of dark liquid in OGT cannister at present (placed early this am). No melena. Patient likely aspirated with progressive opacity in right lung on xray. He has likely aspirated. Electively intubated now.   Patient is on Carafate and Zantac per home med list.   Past Medical History  Diagnosis Date  . Seizures   . Cerebral palsy   . Hypertension   . Mental retardation   . Depression   . Nephrolithiasis   . BPH (benign prostatic hyperplasia)   . GI bleed 2009    coffee ground emesis, erosive esophagitis on EGD by Dr Juanda Chance  . Esophageal stricture 2009    not dilated during the EGD, biopsy benign:  no Barretts  . Depression with anxiety    Past Surgical History  Procedure Laterality Date  . Basal cell carcinoma excision  2002    forehead  . Squamous cell carcinoma excision  2004    lip  . Bowen dz  2002    right cheek   . Esophagogastroduodenoscopy  06/14/2012    Procedure: ESOPHAGOGASTRODUODENOSCOPY (EGD);  Surgeon: Rachael Fee, MD;  Location: [facility] ENDOSCOPY;  Service: Endoscopy;  Laterality: N/A;  . Multiple extractions with alveoloplasty N/A 06/20/2014    Procedure: MULTIPLE EXTRACTIONS TEETH NUMBER TWO, TEN, TWELVE, EIGHTEEN, TWENTY-ONE, TWENTY-TWO, TWENTY-NINE, THIRTY-TWO;  Surgeon: Georgia Lopes, DDS;  Location: [facility] OR;  Service: Oral Surgery;  Laterality: N/A;    FMH: unobtainable  Social History  Substance Use Topics  . Smoking status: Never Smoker   . Smokeless tobacco: None  . Alcohol Use: No   Current Facility-Administered Medications  Medication Dose Route Frequency Provider Last Rate Last Dose  . 0.9 %  sodium  chloride infusion  250 mL Intravenous PRN Roslynn Amble, MD      . 0.9 %  sodium chloride infusion   Intravenous Continuous Roslynn Amble, MD 150 mL/hr at 06/20/15 0817    . ampicillin-sulbactam (UNASYN) 1.5 g in sodium chloride 0.9 % 50 mL IVPB  1.5 g Intravenous Q6H Roslynn Amble, MD   Stopped at 06/20/15 [phone]  . fentaNYL (SUBLIMAZE) injection 25-100 mcg  25-100 mcg Intravenous Q2H PRN Lupita Leash, MD      . lidocaine (cardiac) 100 mg/38ml (XYLOCAINE) 20 MG/ML injection 2%           . ondansetron (ZOFRAN) injection 4 mg  4 mg Intravenous Q6H PRN Roslynn Amble, MD      . pantoprazole (PROTONIX) 80 mg in sodium chloride 0.9 % 250 mL (0.32 mg/mL) infusion  8 mg/hr Intravenous Continuous Antony Madura, PA-C 25 mL/hr at 06/20/15 0607 8 mg/hr at 06/20/15 0607  . propofol (DIPRIVAN) 1000 MG/100ML infusion  0-50 mcg/kg/min Intravenous Continuous Roslynn Amble, MD 12.5 mL/hr at 06/20/15 0844 40 mcg/kg/min at 06/20/15 0844  . sodium chloride 0.9 % bolus 500 mL  500 mL Intravenous Once Lupita Leash, MD 1,000 mL/hr at 06/20/15 0907 500 mL at 06/20/15 0907  . succinylcholine (ANECTINE) 20 MG/ML injection            Current Outpatient Prescriptions  Medication Sig Dispense Refill  .  albuterol (PROVENTIL) (2.5 MG/3ML) 0.083% nebulizer solution Take 3 mLs (2.5 mg total) by nebulization every 6 (six) hours as needed for wheezing or shortness of breath. 75 mL 12  . baclofen (LIORESAL) 10 MG tablet Take 5 mg by mouth 3 (three) times daily.    . cloNIDine (CATAPRES) 0.2 MG tablet Take 1 tablet (0.2 mg total) by mouth 2 (two) times daily. 60 tablet 2  . cycloSPORINE (RESTASIS) 0.05 % ophthalmic emulsion Place 1 drop into both eyes 2 (two) times daily.    Marland Kitchen dutasteride (AVODART) 0.5 MG capsule Take 0.5 mg by mouth daily.    Marland Kitchen guaiFENesin (ROBITUSSIN) 100 MG/5ML SOLN Take 10 mLs by mouth 3 (three) times daily. For 10 days    . Lacosamide (VIMPAT) 150 MG TABS Take 150 mg by mouth 2 (two)  times daily.    Marland Kitchen lisinopril (PRINIVIL,ZESTRIL) 30 MG tablet Take 1 tablet (30 mg total) by mouth daily with breakfast. (Patient taking differently: Take 30 mg by mouth daily. ) 30 tablet 0  . LORazepam (ATIVAN) 0.5 MG tablet Take 0.5 mg by mouth every 8 (eight) hours.    . metoprolol (LOPRESSOR) 50 MG tablet Take 1 tablet (50 mg total) by mouth 2 (two) times daily. 60 tablet 0  . Multiple Vitamin (DAILY VITE PO) Take 1 tablet by mouth daily with breakfast.     . ondansetron (ZOFRAN) 4 MG tablet Take 4 mg by mouth 3 (three) times daily as needed for nausea or vomiting.    . potassium chloride 20 MEQ TBCR Take 20 mEq by mouth daily. 30 tablet 0  . prazosin (MINIPRESS) 2 MG capsule Take 2 mg by mouth at bedtime.     . promethazine (PHENERGAN) 12.5 MG tablet Take 12.5 mg by mouth every 12 (twelve) hours as needed for nausea or vomiting.    Marland Kitchen QUEtiapine (SEROQUEL) 25 MG tablet Take 25 mg by mouth at bedtime.    . ranitidine (ZANTAC) 150 MG tablet Take 150 mg by mouth 2 (two) times daily.    . sucralfate (CARAFATE) 1 GM/10ML suspension Take 1 g by mouth 4 (four) times daily -  with meals and at bedtime.     . tamsulosin (FLOMAX) 0.4 MG CAPS capsule Take 0.4 mg by mouth daily.    Marland Kitchen amoxicillin-clavulanate (AUGMENTIN) 875-125 MG per tablet Take 1 tablet by mouth 2 (two) times daily. X 4 more days (Patient not taking: Reported on 06/20/2015) 8 tablet 0  . Dutasteride-Tamsulosin HCl (JALYN) 0.5-0.4 MG CAPS Take 1 tablet by mouth daily with breakfast.     . escitalopram (LEXAPRO) 20 MG tablet Take 20 mg by mouth daily with breakfast.     . food thickener (THICK IT) POWD Take 1 g by mouth 2 (two) times daily.    . fosfomycin (MONUROL) 3 G PACK Take 3 g by mouth once. Please give 1 tab on 12/09/2014 (Patient not taking: Reported on 06/20/2015) 3 g 0  . LORazepam (ATIVAN) 1 MG tablet Take 1 tablet (1 mg total) by mouth every 8 (eight) hours as needed for anxiety. (Patient not taking: Reported on 06/20/2015) 30  tablet 0  . Maltodextrin-Xanthan Gum (RESOURCE THICKENUP CLEAR) POWD 2 times a day with meals (Patient not taking: Reported on 12/02/2014) 30 Can 2  . saccharomyces boulardii (FLORASTOR) 250 MG capsule Take 1 capsule (250 mg total) by mouth 2 (two) times daily. While on antibiotics (Patient not taking: Reported on 06/20/2015) 10 capsule 0  . [DISCONTINUED] doxazosin (CARDURA) 8 MG tablet  Take 8 mg by mouth at bedtime.      . [DISCONTINUED] phenytoin (DILANTIN) 200 MG ER capsule Take 200 mg by mouth 2 (two) times daily.       No Known Allergies   Review of Systems: Unobtainable.   Physical Exam: BP 99/73 mmHg  Pulse 83  Temp(Src) 98.5 F (36.9 C) (Core (Comment))  Resp 17  Ht 5\' 7"  (1.702 m)  Wt 115 lb (52.164 kg)  BMI 18.01 kg/m2  SpO2 96% Constitutional: thin white male, intubated in no acute distress. HEENT: Normocephalic and atraumatic.  Neck supple.  Cardiovascular: tachycardic  Pulmonary/chest: Intubated on vent Abdominal: Soft, nondistended. Bowel sounds active throughout. There are no masses palpable. Extremities: no edema Lymphadenopathy: No cervical adenopathy noted. Neurological:sedated Skin: Skin is warm and dry. No rashes noted.   ASSESSMENT AND PLAN:  91. 64 year old male with cerebral palsy admitted with coffee ground emesis / probable aspiration. In resuscitation room now, intubated and awaiting ICU bed. Patient will likely need EGD though bleeding probably due to recurrent / persistent erosive disease. Will see if anyone available to sign consent form but patient might be ward of the state. Continue PPI drip given the multiple episodes of hematemesis. Monitor H&H. He has received 3 liters of IVF thus far.   2. AKI, probably volume depletion and improving with IVF.   3. Anemia of acute blood loss. Hgb 10.5, down about 2 grams from baseline.   4. Mild diverticulosis - last complete colonoscopy Dec 2011   Addendum: Called Roxanne Brand (legal guardan listed in  chart). Voice Mail stated this number was for a "vacant caseload" and not to leave any messages. Will need to proceed with EGD without consent.   Willette Cluster, NP   Attending Addendum: I have taken an reviewed the chart, and examined the patient. I agree with the Advanced Practitioner's note and impression. Patient with coffee ground emesis and drop in Hgb. Intubated due to aspiration of emesis. He has a prior history of esophagitis, and should be on PPI as an outpatient if he has no contraindications given his prior history of bleeding. Agree with EGD to clarify etiology of his bleeding and endoscopically treat if indicated. Continue IV PPI, await EGD results.   Ileene Patrick, MD [facility]astroenterology Pager [phone]

## 2015-06-20 NOTE — Progress Notes (Signed)
Patient transported on vent at 100% FiO2 to ICU unit (1227).  Patient vitals remained stable throughout and there were no complications noted during transport. RT will continue to monitor patient.

## 2015-06-20 NOTE — ED Notes (Signed)
Report given to Shawnda, RN 

## 2015-06-20 NOTE — ED Notes (Signed)
Pt is from [facility] and tonight he started having black vomiting. Pt is actively vomiting. Small amount of watery stool today. Hx of MR and CP. Muscle spasms at times then rigid at times. Alert and oriented per norm.

## 2015-06-20 NOTE — Op Note (Signed)
[facility] [address] [place], [address]   ENDOSCOPY PROCEDURE REPORT  PATIENT: Lavan, Imes  MR#: [id number] BIRTHDATE: 1951-03-21 , 63  yrs. old GENDER: male ENDOSCOPIST: Ileene Patrick, MD REFERRED BY: PROCEDURE DATE:  06/20/2015 PROCEDURE:  EGD, diagnostic ASA CLASS:     Class III. INDICATIONS:  hematemesis. MEDICATIONS: Anesthesia per vent management TOPICAL ANESTHETIC:  DESCRIPTION OF PROCEDURE: After the risks benefits and alternatives of the procedure were thoroughly explained, informed consent was obtained.  The [id number] ( [id number] ) endoscope was introduced through the mouth and advanced to the second portion of the duodenum , Without limitations.  The instrument was slowly withdrawn as the mucosa was fully examined.        FINDINGS: There were some erosive changes / mild ulceration at the GEJ without any high risk stigmata of bleeding.  The remainder of the esophagus was normal.  The DH was located 42cm from the incisors, the GEJ/SCJ at 37cm from the incisors, with a 5cm hiatal hernia.  There was no mucosal abnormalities within the hernia, no Cameron lesions.  The stomach was remarkable for a few erythematous spots consistent with changes from recent NG tube placement.  No other mucosal abnormalities of the stomach was appreciated.  The duodenal bulb and 2nd portion duodenum were normal.  No evidence of stigmata of bleeding.  No heme was noted on the exam.  Retroflexed views revealed as previously described.     The scope was then withdrawn from the patient and the procedure completed.  COMPLICATIONS: There were no immediate complications.  ENDOSCOPIC IMPRESSION: Erosive changes at the GEJ without high risk stigmata of bleeding, but otherwise normal esophagus 5cm hiatal hernia, no Camerson lesions Stigmata of NG tube placement in the stomach without other pathology noted to cause bleeding Normal duodenum  Overall, other than  some mild erosive changes no significant pathology noted to cause bleeding. Perhaps the patient had a Mallory Weiss tear in the setting of vomiting which has since healed or not appreciated. There is no evidence of active GI bleeding on this exam.  RECOMMENDATIONS: Continue supportive care for aspiration pneumonia Continue PPI Monitor H/H If the patient has recurrence of symptoms please notify us    eSigned:  Ileene Patrick, MD 06/20/2015 4:29 PM    CC:  PATIENT NAME:  Samarth, Ogle MR#: [id number]

## 2015-06-20 NOTE — ED Notes (Signed)
RN STATED THAT THEY WOULD COLLECT I-STAT WHEN THEY START AN IV

## 2015-06-20 NOTE — ED Notes (Signed)
Bed: ZO10 Expected date:  Expected time:  Means of arrival:  Comments: EMS 63yo MR / CP - vomited dark stool looking emesis; dec bowel sounds, abd rigid

## 2015-06-21 ENCOUNTER — Encounter (HOSPITAL_COMMUNITY): Payer: Self-pay | Admitting: Gastroenterology

## 2015-06-21 DIAGNOSIS — J9690 Respiratory failure, unspecified, unspecified whether with hypoxia or hypercapnia: Secondary | ICD-10-CM | POA: Diagnosis present

## 2015-06-21 DIAGNOSIS — G40909 Epilepsy, unspecified, not intractable, without status epilepticus: Secondary | ICD-10-CM

## 2015-06-21 LAB — CBC WITH DIFFERENTIAL/PLATELET
BASOS ABS: 0 10*3/uL (ref 0.0–0.1)
BASOS ABS: 0 10*3/uL (ref 0.0–0.1)
BASOS ABS: 0 10*3/uL (ref 0.0–0.1)
Basophils Relative: 0 %
Basophils Relative: 0 %
Basophils Relative: 0 %
EOS ABS: 0.3 10*3/uL (ref 0.0–0.7)
EOS PCT: 1 %
Eosinophils Absolute: 0.2 10*3/uL (ref 0.0–0.7)
Eosinophils Absolute: 0.2 10*3/uL (ref 0.0–0.7)
Eosinophils Relative: 1 %
Eosinophils Relative: 2 %
HCT: 26.9 % — ABNORMAL LOW (ref 39.0–52.0)
HCT: 26 % — ABNORMAL LOW (ref 39.0–52.0)
HEMATOCRIT: 28.3 % — AB (ref 39.0–52.0)
HEMOGLOBIN: 9.8 g/dL — AB (ref 13.0–17.0)
HEMOGLOBIN: 9.1 g/dL — AB (ref 13.0–17.0)
Hemoglobin: 9.2 g/dL — ABNORMAL LOW (ref 13.0–17.0)
LYMPHS ABS: 1.3 10*3/uL (ref 0.7–4.0)
LYMPHS PCT: 8 %
LYMPHS PCT: 10 %
Lymphocytes Relative: 8 %
Lymphs Abs: 1.2 10*3/uL (ref 0.7–4.0)
Lymphs Abs: 1.7 10*3/uL (ref 0.7–4.0)
MCH: 33.3 pg (ref 26.0–34.0)
MCH: 32.6 pg (ref 26.0–34.0)
MCH: 33.3 pg (ref 26.0–34.0)
MCHC: 34.6 g/dL (ref 30.0–36.0)
MCHC: 34.2 g/dL (ref 30.0–36.0)
MCHC: 35 g/dL (ref 30.0–36.0)
MCV: 96.3 fL (ref 78.0–100.0)
MCV: 95.4 fL (ref 78.0–100.0)
MCV: 95.2 fL (ref 78.0–100.0)
MONO ABS: 0.8 10*3/uL (ref 0.1–1.0)
MONOS PCT: 6 %
MONOS PCT: 5 %
MONOS PCT: 5 %
Monocytes Absolute: 0.9 10*3/uL (ref 0.1–1.0)
Monocytes Absolute: 0.8 10*3/uL (ref 0.1–1.0)
NEUTROS PCT: 85 %
NEUTROS PCT: 86 %
NEUTROS PCT: 83 %
Neutro Abs: 13.1 10*3/uL — ABNORMAL HIGH (ref 1.7–7.7)
Neutro Abs: 14 10*3/uL — ABNORMAL HIGH (ref 1.7–7.7)
Neutro Abs: 13.8 10*3/uL — ABNORMAL HIGH (ref 1.7–7.7)
PLATELETS: 165 10*3/uL (ref 150–400)
Platelets: 178 10*3/uL (ref 150–400)
Platelets: 177 10*3/uL (ref 150–400)
RBC: 2.94 MIL/uL — AB (ref 4.22–5.81)
RBC: 2.82 MIL/uL — AB (ref 4.22–5.81)
RBC: 2.73 MIL/uL — AB (ref 4.22–5.81)
RDW: 15.2 % (ref 11.5–15.5)
RDW: 15.5 % (ref 11.5–15.5)
RDW: 15.7 % — ABNORMAL HIGH (ref 11.5–15.5)
WBC MORPHOLOGY: INCREASED
WBC: 15.4 10*3/uL — AB (ref 4.0–10.5)
WBC: 16.3 10*3/uL — AB (ref 4.0–10.5)
WBC: 16.6 10*3/uL — ABNORMAL HIGH (ref 4.0–10.5)

## 2015-06-21 LAB — RENAL FUNCTION PANEL
ANION GAP: 7 (ref 5–15)
Albumin: 2.7 g/dL — ABNORMAL LOW (ref 3.5–5.0)
BUN: 26 mg/dL — ABNORMAL HIGH (ref 6–20)
CHLORIDE: 120 mmol/L — AB (ref 101–111)
CO2: 14 mmol/L — AB (ref 22–32)
CREATININE: 1.26 mg/dL — AB (ref 0.61–1.24)
Calcium: 8.3 mg/dL — ABNORMAL LOW (ref 8.9–10.3)
GFR calc Af Amer: >60 mL/min (ref >60)
GFR, EST NON AFRICAN AMERICAN: 59 mL/min — AB (ref >60)
Glucose, Bld: 60 mg/dL — ABNORMAL LOW (ref 65–99)
POTASSIUM: 4.4 mmol/L (ref 3.5–5.1)
Phosphorus: 2.1 mg/dL — ABNORMAL LOW (ref 2.5–4.6)
Sodium: 141 mmol/L (ref 135–145)

## 2015-06-21 LAB — GLUCOSE, CAPILLARY
GLUCOSE-CAPILLARY: 131 mg/dL — AB (ref 65–99)
GLUCOSE-CAPILLARY: 60 mg/dL — AB (ref 65–99)
GLUCOSE-CAPILLARY: 72 mg/dL (ref 65–99)
GLUCOSE-CAPILLARY: 74 mg/dL (ref 65–99)
GLUCOSE-CAPILLARY: 78 mg/dL (ref 65–99)
GLUCOSE-CAPILLARY: 79 mg/dL (ref 65–99)
GLUCOSE-CAPILLARY: 82 mg/dL (ref 65–99)
Glucose-Capillary: 59 mg/dL — ABNORMAL LOW (ref 65–99)

## 2015-06-21 LAB — TRIGLYCERIDES: TRIGLYCERIDES: 64 mg/dL (ref <150)

## 2015-06-21 LAB — MAGNESIUM: MAGNESIUM: 2.3 mg/dL (ref 1.7–2.4)

## 2015-06-21 MED ORDER — GLYCOPYRROLATE 0.2 MG/ML IJ SOLN
0.1000 mg | Freq: Three times a day (TID) | INTRAMUSCULAR | Status: DC | PRN
  Administered 2015-06-22 – 2015-07-06 (×10): 0.1 mg via INTRAVENOUS
  Filled 2015-06-21 (×15): qty 0.5

## 2015-06-21 MED ORDER — DEXTROSE 10 % IV SOLN
INTRAVENOUS | Status: DC
  Administered 2015-06-21 – 2015-06-22 (×5): via INTRAVENOUS

## 2015-06-21 MED ORDER — DEXTROSE 50 % IV SOLN
1.0000 | Freq: Once | INTRAVENOUS | Status: AC
  Administered 2015-06-21: 50 mL via INTRAVENOUS

## 2015-06-21 MED ORDER — DEXTROSE 50 % IV SOLN
INTRAVENOUS | Status: AC
  Administered 2015-06-21: 50 mL via INTRAVENOUS
  Filled 2015-06-21: qty 50

## 2015-06-21 MED ORDER — SODIUM PHOSPHATE 3 MMOLE/ML IV SOLN
30.0000 mmol | Freq: Once | INTRAVENOUS | Status: AC
  Administered 2015-06-21: 30 mmol via INTRAVENOUS
  Filled 2015-06-21: qty 10

## 2015-06-21 MED ORDER — VITAMINS A & D EX OINT
TOPICAL_OINTMENT | CUTANEOUS | Status: AC
  Filled 2015-06-21: qty 5

## 2015-06-21 MED ORDER — METOPROLOL TARTRATE 25 MG/10 ML ORAL SUSPENSION
12.5000 mg | Freq: Two times a day (BID) | ORAL | Status: DC
  Administered 2015-06-21 – 2015-06-23 (×5): 12.5 mg
  Filled 2015-06-21 (×8): qty 5

## 2015-06-21 MED ORDER — DEXTROSE 50 % IV SOLN
INTRAVENOUS | Status: AC
  Administered 2015-06-21: 50 mL
  Filled 2015-06-21: qty 50

## 2015-06-21 MED ORDER — CHLORHEXIDINE GLUCONATE 0.12 % MT SOLN
OROMUCOSAL | Status: AC
  Filled 2015-06-21: qty 15

## 2015-06-21 NOTE — Progress Notes (Signed)
Updated Pt. Guardian pt. status. Information for former guardian Campbell Lerner, [phone]) deleted and replaced with new guardian information Colin Bradley, [phone]). Colin Dikes, RN 06/21/2015 2:22 PM

## 2015-06-21 NOTE — Care Management Note (Signed)
Case Management Note  Patient Details  Name: HUSSAIN MAIMONE MRN: [id number] Date of Birth: Jun 17, 1951  Subjective/Objective:         Gi bleeding with resp failure            Action/Plan: ur done   Expected Discharge Date:   (UNKNOWN)               Expected Discharge Plan:  Home/Self Care  In-House Referral:     Discharge planning Services  CM Consult  Post Acute Care Choice:  NA Choice offered to:  NA  DME Arranged:    DME Agency:     HH Arranged:    HH Agency:     Status of Service:  In process, will continue to follow  Medicare Important Message Given:    Date Medicare IM Given:    Medicare IM give by:    Date Additional Medicare IM Given:    Additional Medicare Important Message give by:     If discussed at Long Length of Stay Meetings, dates discussed:    Additional Comments:  Golda Acre, RN 06/21/2015, 11:08 AM

## 2015-06-21 NOTE — Progress Notes (Signed)
PULMONARY / CRITICAL CARE MEDICINE   Name: Colin Bradley MRN: [id number] DOB: 1951-02-01    ADMISSION DATE:  06/20/2015   REFERRING MD :  EDP  CHIEF COMPLAINT:  GIB  INITIAL PRESENTATION: 64 year old male with known cerebral palsy and intellectual disability is a ward of the state. Presented with coffee-ground emesis on 9/13. Intubated for worsening hypoxic respiratory failure and evidence of aspiration pneumonia on chest x-ray imaging postintubation.  STUDIES:  Portable CXR 9/13 -  right sided mid and lower lung opacification. Renal US 9/13 - small right renal cyst. Cholelithiasis noted. No hydronephrosis.  SIGNIFICANT EVENTS: 9/13 - Intubated & admitted Upper GIB 9/13 - EGD w/ hiatal hernia but no source of active bleeding  SUBJECTIVE:   No acute events overnight. Patient underwent EGD yesterday without active source of bleeding. No further signs of bleeding. Endotracheal aspirate culture was not obtained yesterday.   ROS: Unobtainable as the patient is intubated and sedated.  VITAL SIGNS: Temp:  [97.2 F (36.2 C)-100.9 F (38.3 C)] 100.9 F (38.3 C) (09/14 0900) Pulse Rate:  [71-134] 134 (09/14 0900) Resp:  [14-32] 30 (09/14 0900) BP: (77-175)/(26-90) 148/46 mmHg (09/14 0900) SpO2:  [95 %-99 %] 95 % (09/14 0900) FiO2 (%):  [30 %-80 %] 30 % (09/14 0801) Weight:  [63.7 kg (140 lb 6.9 oz)] 63.7 kg (140 lb 6.9 oz) (09/14 0400) HEMODYNAMICS:   VENTILATOR SETTINGS: Vent Mode:  [-] PRVC FiO2 (%):  [30 %-80 %] 30 % Set Rate:  [14 bmp] 14 bmp Vt Set:  [530 mL] 530 mL PEEP:  [5 cmH20] 5 cmH20 Plateau Pressure:  [20 cmH20-31 cmH20] 31 cmH20 INTAKE / OUTPUT:  Intake/Output Summary (Last 24 hours) at 06/21/15 0921 Last data filed at 06/21/15 0700  Gross per 24 hour  Intake 4945.34 ml  Output   1120 ml  Net 3825.34 ml    PHYSICAL EXAMINATION: General:  Sedated. No family at bedside. No distress. Integument:  Warm & dry. No rash on exposed skin. No bruising. HEENT:   Endotracheal tube in place. Pupils pending. No scleral icterus. Cardiovascular: Tachycardic. Sinus tachycardia on telemetry. No edema. Pulmonary:  Coarse breath sounds bilaterally. Symmetric chest wall rise on ventilator. Abdomen: Soft. Normal bowel sounds. Nondistended.  Neurological:  Patient sedated. He will open eyes minimally and briefly.  LABS:  CBC  Recent Labs Lab 06/20/15 2200 06/21/15 0200 06/21/15 0726  WBC 11.7* 15.4* 16.3*  HGB 9.7* 9.8* 9.2*  HCT 28.4* 28.3* 26.9*  PLT 168 178 165   Coag's  Recent Labs Lab 06/20/15 0940  APTT 40*  INR 1.22   BMET  Recent Labs Lab 06/20/15 0115  06/20/15 0825 06/20/15 1515 06/21/15 0200  NA 137  < > 141 140 141  K 5.5*  < > 5.7* 4.9 4.4  CL 110  < > 116* 120* 120*  CO2 19*  --   --  16* 14*  BUN 40*  < > 40* 33* 26*  CREATININE 1.43*  < > 1.20 1.22 1.26*  GLUCOSE 105*  < > 76 64* 60*  < > = values in this interval not displayed. Electrolytes  Recent Labs Lab 06/20/15 0115 06/20/15 0940 06/20/15 1515 06/21/15 0200  CALCIUM 9.3  --  7.6* 8.3*  MG  --  1.2*  --  2.3  PHOS  --  2.7  --  2.1*   Sepsis Markers  Recent Labs Lab 06/20/15 0125 06/20/15 0940 06/20/15 1515  LATICACIDVEN 2.17* 1.4 1.4   ABG  Recent Labs  Lab 06/20/15 0315 06/20/15 0650  PHART 7.286* 7.283*  PCO2ART 31.8* 31.9*  PO2ART 80.9 126*   Liver Enzymes  Recent Labs Lab 06/20/15 0115 06/21/15 0200  AST 28  --   ALT 26  --   ALKPHOS 96  --   BILITOT 0.4  --   ALBUMIN 3.7 2.7*   Cardiac Enzymes  Recent Labs Lab 06/20/15 0940 06/20/15 1830  TROPONINI <0.03 <0.03   Glucose  Recent Labs Lab 06/20/15 2105 06/21/15 0023 06/21/15 0053 06/21/15 0203 06/21/15 0402 06/21/15 0757  GLUCAP 100* 59* 131* 60* 74 78    Imaging Dg Abd 1 View  06/20/2015   CLINICAL DATA:  64 year old male with enteric tube placement.  EXAM: ABDOMEN - 1 VIEW  COMPARISON:  Radiograph dated 06/20/2015  FINDINGS: An enteric tube is  partially visualized extending to the left upper abdomen with tip over the gastric bubble likely in the body of the stomach. There is no bowel dilatation or evidence of obstruction. No radiopaque calculi identified. There is degenerative changes of the spine. No acute fracture.  IMPRESSION: Enteric tube with tip over the gastric bubble likely within the stomach.   Electronically Signed   By: Elgie Collard M.D.   On: 06/20/2015 18:32   Dg Abd Portable 1v  06/20/2015   CLINICAL DATA:  Orogastric tube placement.  EXAM: PORTABLE ABDOMEN - 1 VIEW  COMPARISON:  06/20/2015  FINDINGS: Enteric catheter has been retracted and side hole now is at the level of the mid thorax, and distal tip at the expected location of distal esophagus. Airspace consolidation is seen within the right lower lobe.  Bowel gas pattern is poorly visualized.  IMPRESSION: Interval retraction of the enteric catheter.   Electronically Signed   By: Ted Mcalpine M.D.   On: 06/20/2015 16:58   Dg Abd Portable 1v  06/20/2015   CLINICAL DATA:  Chest pain. Seizures with vomiting and respiratory distress. Nasogastric tube placement. Initial encounter.  EXAM: PORTABLE ABDOMEN - 1 VIEW  COMPARISON:  06/20/2015 radiographs.  FINDINGS: 0910 hours. Nasogastric tube tip overlies the mid stomach. The visualized bowel gas pattern is nonobstructive. There is persistent right basilar airspace disease suspicious for aspiration.  IMPRESSION: Nasogastric tube tip overlies the mid stomach. Persistent right basilar airspace disease.   Electronically Signed   By: Carey Bullocks M.D.   On: 06/20/2015 09:37     ASSESSMENT / PLAN:  PULMONARY OETT 9/14>> A: Acute Hypoxic Respiratory Failure Aspiration Pneumonia vs Pneumonitis  P:   Vent bundle See ID section Daily SBT Albuterol neb when necessary wheezing  CARDIOVASCULAR A: H/O RBBB  P:  Monitoring on telemetry  RENAL GU A:  Acute Renal Failure - renal U/S without hyrdonephrosis H/O  BPH Lactic Acidosis - resolving  P:   Monitoring urine output with Foley catheter Trending daily BUN/creatinine Monitoring electrolytes daily Flomax via tube  GASTROINTESTINAL A:   Upper GIB - EGD without obvious source. ? Mallory Weiss tear.  P:   GI consulted-patient status post EGD CBC every 6 hours Protonix IV twice a day  HEMATOLOGIC A:   Anemia - secondary to Upper GIB. No further signs of bleeding  P:  Change CBC to every 6 hours SCDs Trending leukocytosis  INFECTIOUS A:   Aspiration Pneumonia vs Pneumonitis  P:   Procalcitonin algorithm Trending leukocytosis  Resp Ctx 9/14>>  Abx: 9/14 unasyn>>  ENDOCRINE A:   Hypoglycemia  P:   D10 @ 30cc/hr Accu-Cheks every 4 hours  NEUROLOGIC A:  H/O Cerebral Palsy H/O Intellectual Disability H/O Seizure Disorder  P:   RASS goal: 0 to -1 Continuing propofol drip Continuing Vimpat Baclofen via tube   FAMILY  - Updates: None at bedside, ward of state.  - Inter-disciplinary family meet or Palliative Care meeting due by:  day 7  TODAY'S SUMMARY:  64 yo WM with existing CP/MR with a history of HTN, erosive esophagitis, previous GI bleed who [facility] from SNF coffee ground emesis and inability to protect airway and aspiration pneumonia. Patient is a ward of the state. EGD showed no obvious active bleeding. Continuing treatment for aspiration pneumonia. Transitioned to bid Protonix IV.  I have spent a total of 31 minutes of critical care time today caring for this patient and reviewing the patient's electronic medical record.  Donna Christen Jamison Neighbor, M.D. [facility] Pulmonary & Critical Care Pager:  [phone] After 3pm or if no response, call [phone]  06/21/2015, 9:21 AM

## 2015-06-21 NOTE — Progress Notes (Signed)
eLink Physician-Brief Progress Note Patient Name: Colin Bradley DOB: 01-28-1951 MRN: [id number]   Date of Service  06/21/2015  HPI/Events of Note  Hypophosphatemia  eICU Interventions  Phos replaced     Intervention Category Intermediate Interventions: Electrolyte abnormality - evaluation and management  Yaman Grauberger 06/21/2015, 3:09 AM

## 2015-06-21 NOTE — Progress Notes (Signed)
Wasted 50 mcg of Fentanyl down the sink with Jamison Oka., RN Milon Dikes, RN 06/21/2015

## 2015-06-21 NOTE — Progress Notes (Signed)
Initial Nutrition Assessment  DOCUMENTATION CODES:   Not applicable  INTERVENTION:  - If TF needed/when appropriate, recommend Vital AF 1.2 @ 55 mL/hr which will provide, in combination with current Propofol rate, 1938 kcal, 99 grams protein, and 1070 mL free water. - RD will continue to monitor for needs  NUTRITION DIAGNOSIS:   Inadequate oral intake related to inability to eat as evidenced by NPO status.  GOAL:   Patient will meet greater than or equal to 90% of their needs  MONITOR:   Vent status, Weight trends, Labs, I & O's, Skin  REASON FOR ASSESSMENT:   Ventilator, Low Braden  ASSESSMENT:   64 yo WM with existing CP/MR with a history of HTN, erosive esophagitis, previous GI bleed who presented to [facility] from SNF coffee ground emesis and inability to protect airway and maintain adequate O2 saturations. He was intubated per EDP, started on PPI drip and GI was consulted. He is a ward of state and is a full code. State appt guardian is Moises Blood DDS [phone]. He will be admitted to ICU under PCCM service.  Pt seen for new vent and low Braden. BMI indicates normal weight status.  Patient is currently intubated on ventilator support with OGT in place; noted 100cc straw-colored drainage. MV: 13.9 L/min Temp (24hrs), Avg:99 F (37.2 C), Min:97.2 F (36.2 C), Max:100.9 F (38.3 C)  Propofol: 13.4 ml/hr (354 kcal)  No visitors in the room to provide information from PTA. RN states pt does not have PEG or G-J tube from PTA.   Per weight hx review, pt has gained 25 lbs since 10/2014. Mild muscle wasting noted to clavicle area and lower legs; no fat wasting.  Unable to meet needs at this time. GI note from this AM indicates no active GIB overnight; will monitor for ability to initiate TF if pt to remain intubated >24 hours.  Medications reviewed. Labs reviewed; CBGs: 57-150 mg/dL, Cl: 098 mmol/L, BUN/creatinine elevated, Ca: 8.3 mg/dL, Phos: 2 mg/dL.    Diet Order:     NPO  Skin:  Reviewed, no issues  Last BM:  PTA  Height:   Ht Readings from Last 1 Encounters:  06/20/15  (1.702 m)    Weight:   Wt Readings from Last 1 Encounters:  06/21/15 140 lb 6.9 oz (63.7 kg)    Ideal Body Weight:  67.27 kg (kg)  BMI:  Body mass index is 21.99 kg/(m^2).  Estimated Nutritional Needs:   Kcal:  1951  Protein:  76-96 grams  Fluid:  1.8 L/day  EDUCATION NEEDS:   No education needs identified at this time     Trenton Gammon, RD, LDN Inpatient Clinical Dietitian Pager # [phone] After hours/weekend pager # [phone]

## 2015-06-21 NOTE — Progress Notes (Signed)
Wasted 50 mcg of Fentanyl with Jamison Oka RN Milon Dikes, RN 06/21/2015

## 2015-06-21 NOTE — Progress Notes (Signed)
eLink Physician-Brief Progress Note Patient Name: IVAN LACHER DOB: 1951-05-15 MRN: [id number]   Date of Service  06/21/2015  HPI/Events of Note  Persistent hypoglycemia despite change in IVFs to D5NS at 100cc/hr.  Current blood sugar of 54.  Has received 1 amp of D50.  eICU Interventions  Plan: D/C D5NS Change IVFs to D10 at 30 cc/hr CBG checks q4 hours     Intervention Category Intermediate Interventions: Other:  Prarthana Parlin 06/21/2015, 12:37 AM

## 2015-06-21 NOTE — Clinical Documentation Improvement (Signed)
Critical Care  Abnormal Lab/Test Results:  06/20/15 (0115)- K= 5.5; 06/20/15 (1515)- K= 5.7  Possible Clinical Conditions associated with below indicators  Hyperkalemia  Other Condition  Cannot Clinically Determine   Please exercise your independent, professional judgment when responding. A specific answer is not anticipated or expected.   Thank You,  Cherylann Ratel, RN, BSN [facility] [place] [phone]

## 2015-06-21 NOTE — Progress Notes (Signed)
Progress Note   Subjective  No evidence of GI bleeding overnight. No blood in the stool. Stable H/H   Objective   Vital signs in last 24 hours: Temp:  [97.2 F (36.2 C)-100.8 F (38.2 C)] 100.8 F (38.2 C) (09/14 0600) Pulse Rate:  [71-123] 108 (09/14 0600) Resp:  [14-32] 27 (09/14 0600) BP: (77-173)/(26-90) 145/47 mmHg (09/14 0600) SpO2:  [95 %-99 %] 96 % (09/14 0600) FiO2 (%):  [30 %-80 %] 30 % (09/14 0416) Weight:  [140 lb 6.9 oz (63.7 kg)] 140 lb 6.9 oz (63.7 kg) (09/14 0400) Last BM Date:  (PTA) General:    white male, intubated Heart:  Tachycardic, normal rhythm; no murmurs Lungs: ventilated, some coarse BS no wheezing Abdomen:  Soft, nontender and nondistended. Normal bowel sounds. Extremities:  Without edema. Neurologic:  sedated Psych:  Sedated / sleeping  Intake/Output from previous day: 09/13 0701 - 09/14 0700 In: 7886.4 [I.V.:6146.4; IV Piggyback:1590] Out: 1120 [Urine:1120] Intake/Output this shift:    Lab Results:  Recent Labs  06/20/15 1830 06/20/15 2200 06/21/15 0200  WBC 8.9 11.7* 15.4*  HGB 9.1* 9.7* 9.8*  HCT 27.0* 28.4* 28.3*  PLT 133* 168 178   BMET  Recent Labs  06/20/15 0115  06/20/15 0825 06/20/15 1515 06/21/15 0200  NA 137  < > 141 140 141  K 5.5*  < > 5.7* 4.9 4.4  CL 110  < > 116* 120* 120*  CO2 19*  --   --  16* 14*  GLUCOSE 105*  < > 76 64* 60*  BUN 40*  < > 40* 33* 26*  CREATININE 1.43*  < > 1.20 1.22 1.26*  CALCIUM 9.3  --   --  7.6* 8.3*  < > = values in this interval not displayed. LFT  Recent Labs  06/20/15 0115 06/21/15 0200  PROT 7.3  --   ALBUMIN 3.7 2.7*  AST 28  --   ALT 26  --   ALKPHOS 96  --   BILITOT 0.4  --    PT/INR  Recent Labs  06/20/15 0940  LABPROT 15.6*  INR 1.22    Studies/Results: Dg Abd 1 View  06/20/2015   CLINICAL DATA:  64 year old male with enteric tube placement.  EXAM: ABDOMEN - 1 VIEW  COMPARISON:  Radiograph dated 06/20/2015  FINDINGS: An enteric tube is  partially visualized extending to the left upper abdomen with tip over the gastric bubble likely in the body of the stomach. There is no bowel dilatation or evidence of obstruction. No radiopaque calculi identified. There is degenerative changes of the spine. No acute fracture.  IMPRESSION: Enteric tube with tip over the gastric bubble likely within the stomach.   Electronically Signed   By: Elgie Collard M.D.   On: 06/20/2015 18:32   US Renal  06/20/2015   CLINICAL DATA:  Acute renal failure.  EXAM: RENAL / URINARY TRACT ULTRASOUND COMPLETE  COMPARISON:  None.  FINDINGS: Right Kidney:  Length: 9.4 cm. 5 mm exophytic cyst is seen in midpole. Echogenicity within normal limits. No mass or hydronephrosis visualized.  Left Kidney:  Length: 9.8 cm. Echogenicity within normal limits. No mass or hydronephrosis visualized.  Bladder:  Urinary bladder is decompressed secondary to Foley catheter.  Incidental note is made of cholelithiasis.  IMPRESSION: Small right renal cyst. Otherwise kidneys appear normal. Incidental note is made of cholelithiasis.   Electronically Signed   By: Lupita Raider, M.D.   On: 06/20/2015 08:55  Dg Chest Port 1 View  06/20/2015   CLINICAL DATA:  Intubation.  EXAM: PORTABLE CHEST - 1 VIEW  COMPARISON:  Earlier this day at 0148 hour  FINDINGS: 0609 hour: Endotracheal tube is 3.2 cm from the carina. Enteric tube in place, tip below the diaphragm not included in the field of view. Progressive opacities in the right mid lower lung zone. Developing left basilar opacity. Cardiomediastinal contours are partially obscured. Question developing right pleural effusion. No pneumothorax.  IMPRESSION: 1. Endotracheal tube in place. Enteric tube tip below the diaphragm, not included in the field of view. 2. Progressive opacity in the right mid lower lung zone, may reflect worsening pneumonia, atelectasis, or developing right pleural effusion. Increasing left basilar opacity, concerning for atelectasis.  Alternatively, aspiration could have this appearance.   Electronically Signed   By: Rubye Oaks M.D.   On: 06/20/2015 06:52   Dg Chest Port 1 View  06/20/2015   CLINICAL DATA:  64 year old male with vomiting  EXAM: PORTABLE ABDOMEN - 1 VIEW; PORTABLE CHEST - 1 VIEW  COMPARISON:  Chest radiograph dated 227 views abdominal radiograph dated 2283 and  FINDINGS: Single portable view of the chest demonstrate patchy areas of nodular and ground-glass opacity in the mid to lower lung field on the right most compatible with pneumonia. A small right pleural effusion may be present. The left lung is clear. No pneumothorax. The cardiac silhouette is within normal limits.  Single-view of the abdomen demonstrate air within the stomach. No dilated small bowel loops identified. The no free air. Copious amount of stool noted throughout the colon and rectum. No radiopaque calculi identified there is  There is degenerative changes of spine.  No acute fracture.  IMPRESSION: Right mid to lower lung field opacities most compatible with pneumonia. Clinical correlation and follow-up recommended.  Constipation with possible fecal impaction within the rectum. No bowel obstruction.   Electronically Signed   By: Elgie Collard M.D.   On: 06/20/2015 02:31   Dg Abd Portable 1v  06/20/2015   CLINICAL DATA:  Orogastric tube placement.  EXAM: PORTABLE ABDOMEN - 1 VIEW  COMPARISON:  06/20/2015  FINDINGS: Enteric catheter has been retracted and side hole now is at the level of the mid thorax, and distal tip at the expected location of distal esophagus. Airspace consolidation is seen within the right lower lobe.  Bowel gas pattern is poorly visualized.  IMPRESSION: Interval retraction of the enteric catheter.   Electronically Signed   By: Ted Mcalpine M.D.   On: 06/20/2015 16:58   Dg Abd Portable 1v  06/20/2015   CLINICAL DATA:  Chest pain. Seizures with vomiting and respiratory distress. Nasogastric tube placement. Initial  encounter.  EXAM: PORTABLE ABDOMEN - 1 VIEW  COMPARISON:  06/20/2015 radiographs.  FINDINGS: 0910 hours. Nasogastric tube tip overlies the mid stomach. The visualized bowel gas pattern is nonobstructive. There is persistent right basilar airspace disease suspicious for aspiration.  IMPRESSION: Nasogastric tube tip overlies the mid stomach. Persistent right basilar airspace disease.   Electronically Signed   By: Carey Bullocks M.D.   On: 06/20/2015 09:37   Dg Abd Portable 1v  06/20/2015   CLINICAL DATA:  64 year old male with vomiting  EXAM: PORTABLE ABDOMEN - 1 VIEW; PORTABLE CHEST - 1 VIEW  COMPARISON:  Chest radiograph dated 227 views abdominal radiograph dated 2283 and  FINDINGS: Single portable view of the chest demonstrate patchy areas of nodular and ground-glass opacity in the mid to lower lung field on the right  most compatible with pneumonia. A small right pleural effusion may be present. The left lung is clear. No pneumothorax. The cardiac silhouette is within normal limits.  Single-view of the abdomen demonstrate air within the stomach. No dilated small bowel loops identified. The no free air. Copious amount of stool noted throughout the colon and rectum. No radiopaque calculi identified there is  There is degenerative changes of spine.  No acute fracture.  IMPRESSION: Right mid to lower lung field opacities most compatible with pneumonia. Clinical correlation and follow-up recommended.  Constipation with possible fecal impaction within the rectum. No bowel obstruction.   Electronically Signed   By: Elgie Collard M.D.   On: 06/20/2015 02:31       Assessment / Plan:   64 y/o male presenting with bloody emesis and drop in Hgb. Previously on zantac for history of esophagitis. Intubated on admission due to aspiration of emesis. EGD yesterday showed no active bleeding. Some erosive changes at the GEJ with hiatal hernia but no Cameron lesions, PUD, or findings with high risk stigmata of bleeding. He  could have otherwise had a Mallory Weiss tear in the setting of vomiting that has since healed or not visualized. At this time BUN trending down, H/H stable, no evidence of active bleeding. His main issue at this time is aspiration pneumonia. Recommend he continue protonix IV at this time until extubated and then transition to oral protonix  BID for a few weeks. Longer term, protonix once daily is fine once through this acute episode, given history of esophagitis and GI bleeds. Please call with questions / concerns, or changes in his status.     Ileene Patrick, MD [facility] Pager [phone]  Active Problems:   Mental retardation   Infantile cerebral palsy   Seizure disorder   Esophagitis   Acute upper GI bleed   GI bleed   Ventilator dependent   Nausea & vomiting     LOS: 1 day   Reeves Forth Valeri Sula  06/21/2015, 7:20 AM

## 2015-06-22 ENCOUNTER — Inpatient Hospital Stay (HOSPITAL_COMMUNITY): Payer: Medicare Other

## 2015-06-22 LAB — CBC WITH DIFFERENTIAL/PLATELET
BASOS ABS: 0 10*3/uL (ref 0.0–0.1)
Basophils Absolute: 0 10*3/uL (ref 0.0–0.1)
Basophils Absolute: 0 10*3/uL (ref 0.0–0.1)
Basophils Relative: 0 %
Basophils Relative: 0 %
Basophils Relative: 0 %
EOS ABS: 0.4 10*3/uL (ref 0.0–0.7)
EOS PCT: 3 %
EOS PCT: 3 %
Eosinophils Absolute: 0.4 10*3/uL (ref 0.0–0.7)
Eosinophils Absolute: 0.5 10*3/uL (ref 0.0–0.7)
Eosinophils Relative: 3 %
HCT: 26.8 % — ABNORMAL LOW (ref 39.0–52.0)
HEMATOCRIT: 25.7 % — AB (ref 39.0–52.0)
HEMATOCRIT: 25.2 % — AB (ref 39.0–52.0)
HEMOGLOBIN: 8.9 g/dL — AB (ref 13.0–17.0)
HEMOGLOBIN: 8.6 g/dL — AB (ref 13.0–17.0)
Hemoglobin: 9.3 g/dL — ABNORMAL LOW (ref 13.0–17.0)
LYMPHS ABS: 1.2 10*3/uL (ref 0.7–4.0)
LYMPHS ABS: 1.3 10*3/uL (ref 0.7–4.0)
LYMPHS ABS: 1.2 10*3/uL (ref 0.7–4.0)
LYMPHS PCT: 9 %
LYMPHS PCT: 9 %
Lymphocytes Relative: 8 %
MCH: 32.3 pg (ref 26.0–34.0)
MCH: 33.2 pg (ref 26.0–34.0)
MCH: 32.7 pg (ref 26.0–34.0)
MCHC: 34.6 g/dL (ref 30.0–36.0)
MCHC: 34.1 g/dL (ref 30.0–36.0)
MCHC: 34.7 g/dL (ref 30.0–36.0)
MCV: 94.7 fL (ref 78.0–100.0)
MCV: 95.7 fL (ref 78.0–100.0)
MCV: 94.5 fL (ref 78.0–100.0)
MONO ABS: 0.5 10*3/uL (ref 0.1–1.0)
MONOS PCT: 4 %
MONOS PCT: 5 %
Monocytes Absolute: 0.6 10*3/uL (ref 0.1–1.0)
Monocytes Absolute: 0.7 10*3/uL (ref 0.1–1.0)
Monocytes Relative: 4 %
NEUTROS ABS: 12.3 10*3/uL — AB (ref 1.7–7.7)
NEUTROS ABS: 11.6 10*3/uL — AB (ref 1.7–7.7)
NEUTROS PCT: 84 %
Neutro Abs: 12.9 10*3/uL — ABNORMAL HIGH (ref 1.7–7.7)
Neutrophils Relative %: 84 %
Neutrophils Relative %: 85 %
PLATELETS: 157 10*3/uL (ref 150–400)
PLATELETS: 143 10*3/uL — AB (ref 150–400)
Platelets: 139 10*3/uL — ABNORMAL LOW (ref 150–400)
RBC: 2.66 MIL/uL — ABNORMAL LOW (ref 4.22–5.81)
RBC: 2.72 MIL/uL — AB (ref 4.22–5.81)
RBC: 2.8 MIL/uL — ABNORMAL LOW (ref 4.22–5.81)
RDW: 15.4 % (ref 11.5–15.5)
RDW: 15.5 % (ref 11.5–15.5)
RDW: 15.6 % — ABNORMAL HIGH (ref 11.5–15.5)
WBC: 13.8 10*3/uL — ABNORMAL HIGH (ref 4.0–10.5)
WBC: 14.5 10*3/uL — AB (ref 4.0–10.5)
WBC: 15.3 10*3/uL — ABNORMAL HIGH (ref 4.0–10.5)

## 2015-06-22 LAB — RENAL FUNCTION PANEL
Albumin: 2.4 g/dL — ABNORMAL LOW (ref 3.5–5.0)
Anion gap: 7 (ref 5–15)
BUN: 20 mg/dL (ref 6–20)
CHLORIDE: 115 mmol/L — AB (ref 101–111)
CO2: 17 mmol/L — AB (ref 22–32)
CREATININE: 1.32 mg/dL — AB (ref 0.61–1.24)
Calcium: 8.4 mg/dL — ABNORMAL LOW (ref 8.9–10.3)
GFR calc non Af Amer: 55 mL/min — ABNORMAL LOW (ref >60)
Glucose, Bld: 99 mg/dL (ref 65–99)
POTASSIUM: 3.7 mmol/L (ref 3.5–5.1)
Phosphorus: 4.8 mg/dL — ABNORMAL HIGH (ref 2.5–4.6)
Sodium: 139 mmol/L (ref 135–145)

## 2015-06-22 LAB — GLUCOSE, CAPILLARY
GLUCOSE-CAPILLARY: 96 mg/dL (ref 65–99)
GLUCOSE-CAPILLARY: 98 mg/dL (ref 65–99)
Glucose-Capillary: 103 mg/dL — ABNORMAL HIGH (ref 65–99)
Glucose-Capillary: 95 mg/dL (ref 65–99)
Glucose-Capillary: 81 mg/dL (ref 65–99)
Glucose-Capillary: 93 mg/dL (ref 65–99)

## 2015-06-22 LAB — MAGNESIUM: MAGNESIUM: 1.7 mg/dL (ref 1.7–2.4)

## 2015-06-22 LAB — TRIGLYCERIDES: Triglycerides: 122 mg/dL (ref <150)

## 2015-06-22 NOTE — Progress Notes (Signed)
CSW consulted to assist with d/c planning. Pt is presently intubated. Unable to assess at this time. Pt is from [facility], [place]. A higher level of care may be needed at d/c. Pt has a legal guardian, Dennard Nip [place], [phone]). CSW will follow to assist with d/c planning needs.  Cori Razor LCSW [phone]

## 2015-06-22 NOTE — Progress Notes (Signed)
PULMONARY / CRITICAL CARE MEDICINE   Name: Colin Bradley MRN: [id number] DOB: 02-15-1951    ADMISSION DATE:  06/20/2015   REFERRING MD :  EDP  CHIEF COMPLAINT:  GIB  INITIAL PRESENTATION: 64 year old male with known cerebral palsy and intellectual disability is a ward of the state. Presented with coffee-ground emesis on 9/13. Intubated for worsening hypoxic respiratory failure and evidence of aspiration pneumonia on chest x-ray imaging postintubation.  STUDIES:  Portable CXR 9/13 -  right sided mid and lower lung opacification. Renal US 9/13 - small right renal cyst. Cholelithiasis noted. No hydronephrosis.  SIGNIFICANT EVENTS: 9/13 - Intubated & admitted Upper GIB 9/13 - EGD w/ hiatal hernia but no source of active bleeding  SUBJECTIVE:   No acute events overnight. Restarted Lopressor 9/14 for tachycardia.    ROS: Unobtainable as the patient is intubated and sedated.  VITAL SIGNS: Temp:  [99.3 F (37.4 C)-100.9 F (38.3 C)] 99.7 F (37.6 C) (09/15 0800) Pulse Rate:  [93-134] 108 (09/15 0800) Resp:  [20-33] 26 (09/15 0800) BP: (99-150)/(41-78) 133/63 mmHg (09/15 0800) SpO2:  [91 %-98 %] 97 % (09/15 0800) FiO2 (%):  [30 %] 30 % (09/15 0835) Weight:  [61.1 kg (134 lb 11.2 oz)] 61.1 kg (134 lb 11.2 oz) (09/15 0500) HEMODYNAMICS:   VENTILATOR SETTINGS: Vent Mode:  [-] CPAP FiO2 (%):  [30 %] 30 % Set Rate:  [14 bmp] 14 bmp Vt Set:  [530 mL] 530 mL PEEP:  [0 cmH20-5 cmH20] 0 cmH20 Pressure Support:  [5 cmH20] 5 cmH20 Plateau Pressure:  [19 cmH20-26 cmH20] 19 cmH20 INTAKE / OUTPUT:  Intake/Output Summary (Last 24 hours) at 06/22/15 0853 Last data filed at 06/22/15 0800  Gross per 24 hour  Intake 1905.87 ml  Output   1875 ml  Net  30.87 ml    PHYSICAL EXAMINATION: General:  Sedated. Does open eyes. No distress. Integument:  Warm & dry. No rash on exposed skin.  HEENT:  Endotracheal tube in place. Pupils equal. No scleral icterus. Cardiovascular: Tachycardic.  Sinus tachycardia on telemetry. No edema. Pulmonary:  Good aeration bilaterally. Symmetric chest wall rise on ventilator. Abdomen: Soft. Normal bowel sounds. Nondistended.  Neurological:  Patient sedated. He will open eyes to voice. Doesn't follow commands.  LABS:  CBC  Recent Labs Lab 06/21/15 1925 06/22/15 0012 06/22/15 0600  WBC 16.6* 15.3* 14.5*  HGB 9.1* 9.3* 8.9*  HCT 26.0* 26.8* 25.7*  PLT 177 157 143*   Coag's  Recent Labs Lab 06/20/15 0940  APTT 40*  INR 1.22   BMET  Recent Labs Lab 06/20/15 1515 06/21/15 0200 06/22/15 0615  NA 140 141 139  K 4.9 4.4 3.7  CL 120* 120* 115*  CO2 16* 14* 17*  BUN 33* 26* 20  CREATININE 1.22 1.26* 1.32*  GLUCOSE 64* 60* 99   Electrolytes  Recent Labs Lab 06/20/15 0940 06/20/15 1515 06/21/15 0200 06/22/15 0615  CALCIUM  --  7.6* 8.3* 8.4*  MG 1.2*  --  2.3 1.7  PHOS 2.7  --  2.1* 4.8*   Sepsis Markers  Recent Labs Lab 06/20/15 0125 06/20/15 0940 06/20/15 1515  LATICACIDVEN 2.17* 1.4 1.4   ABG  Recent Labs Lab 06/20/15 0315 06/20/15 0650  PHART 7.286* 7.283*  PCO2ART 31.8* 31.9*  PO2ART 80.9 126*   Liver Enzymes  Recent Labs Lab 06/20/15 0115 06/21/15 0200 06/22/15 0615  AST 28  --   --   ALT 26  --   --   ALKPHOS 96  --   --  BILITOT 0.4  --   --   ALBUMIN 3.7 2.7* 2.4*   Cardiac Enzymes  Recent Labs Lab 06/20/15 0940 06/20/15 1830  TROPONINI <0.03 <0.03   Glucose  Recent Labs Lab 06/21/15 0757 06/21/15 1213 06/21/15 1602 06/21/15 1927 06/21/15 2347 06/22/15 0302  GLUCAP 78 79 82 72 96 103*    Imaging Dg Chest Port 1 View  06/22/2015   CLINICAL DATA:  Respiratory failure.  EXAM: PORTABLE CHEST - 1 VIEW  COMPARISON:  06/20/2015.  FINDINGS: Endotracheal tube in stable position. NG tube tip in the stomach. NG tube side hole is at the gastroesophageal junction. Slight advancement of NG tube should be considered. Mediastinum and hilar structures are normal. Persistent but  improving right mid and right lower lobe infiltrate and right pleural effusion. Persistent right base atelectasis. Persistent left lower lobe mild atelectasis and/or infiltrate. Heart size stable. No pulmonary venous congestion. No pneumothorax.  IMPRESSION: 1. Endotracheal tube in good anatomic position . NG tube tip in the stomach. NG tube side hole at the gastroesophageal junction. Slight advanced of the NG tube should be considered . 2. Persistent but improving right mid and right lower lobe infiltrate and right pleural effusion. Persistent right base atelectasis. 3. Persistent left lower lobe mild atelectasis and/or infiltrate .   Electronically Signed   By: Maisie Fus  Register   On: 06/22/2015 07:19     ASSESSMENT / PLAN:  PULMONARY OETT 9/14>> A: Acute Hypoxic Respiratory Failure Aspiration Pneumonia vs Pneumonitis  P:   Vent bundle See ID section Daily SBT Albuterol neb when necessary wheezing  CARDIOVASCULAR A: H/O RBBB  P:  Monitoring on telemetry Lopressor via tube q12hr  RENAL GU A:  Acute Renal Failure - renal U/S without hyrdonephrosis H/O BPH Lactic Acidosis - resolved  P:   Monitoring urine output with Foley catheter Trending daily BUN/creatinine Monitoring electrolytes daily Flomax via tube  GASTROINTESTINAL A:   Upper GIB - EGD without obvious source. ? Mallory Weiss tear.  P:   GI consulted-patient status post EGD Change CBC to q12hr Protonix IV twice a day  HEMATOLOGIC A:   Anemia - secondary to Upper GIB. No further signs of bleeding Leukocytosis - improving.  P:  Change CBC to every 6 hours SCDs Trending leukocytosis  INFECTIOUS A:   Aspiration Pneumonia vs Pneumonitis  P:   Procalcitonin algorithm Trending leukocytosis  Resp Ctx 9/14>>  Abx: 9/13 unasyn>>  ENDOCRINE A:   Hypoglycemia - improved on D10  P:   D10 @ 30cc/hr Accu-Cheks every 4 hours  NEUROLOGIC A:   H/O Cerebral Palsy H/O Intellectual Disability H/O  Seizure Disorder  P:   RASS goal: 0 to -1 Continuing propofol drip Fentanyl IV prn Continuing Vimpat Baclofen via tube   FAMILY  - Updates: None at bedside, ward of state. RN notified guardian via phone.  - Inter-disciplinary family meet or Palliative Care meeting due by:  9/20  TODAY'S SUMMARY:  64 yo WM with existing CP/MR with a history of HTN, erosive esophagitis, previous GI bleed who presented to [facility] from SNF coffee ground emesis and inability to protect airway and aspiration pneumonia. Patient is a ward of the state. EGD showed no obvious active bleeding. No further bleeding. Continuing ventilator support & treatment of aspiration pneumonia due to high MV requirement.  I have spent a total of 32 minutes of critical care time today caring for this patient, discussing his SBT with respiratory therapy, and reviewing the patient's electronic medical record.  Donna Christen  Jamison Neighbor, M.D. [facility] Pulmonary & Critical Care Pager:  [phone] After 3pm or if no response, call [phone]  06/22/2015, 8:53 AM

## 2015-06-22 NOTE — Progress Notes (Addendum)
PULMONARY / CRITICAL CARE MEDICINE   Name: Colin Bradley MRN: [id number] DOB: 11/28/1950    ADMISSION DATE:  06/20/2015   REFERRING MD :  EDP  CHIEF COMPLAINT:  GIB  INITIAL PRESENTATION: 64 year old male with known cerebral palsy and intellectual disability is a ward of the state. Presented with coffee-ground emesis on 9/13. Intubated for worsening hypoxic respiratory failure and evidence of aspiration pneumonia on chest x-ray imaging post-intubation.  STUDIES:  Portable CXR 9/13 -  right sided mid and lower lung opacification. Renal US 9/13 - small right renal cyst. Cholelithiasis noted. No hydronephrosis.  SIGNIFICANT EVENTS: 9/13 - Intubated & admitted Upper GIB 9/13 - EGD w/ hiatal hernia but no source of active bleeding 9/14 hyperchloremic acidosis  9/15 - tachycardic, lopressor restarted. Continued on rx for pneumonia  9/16: WBC trending down, hgb stable, still w/ NAG metabolic acidosis.  SUBJECTIVE:  Pt in no distress, intubated & sedated but F/Vt looks great.     ROS: Unobtainable as the patient is intubated and sedated   VITAL SIGNS: Temp:  [99.3 F (37.4 C)-100.6 F (38.1 C)] 99.9 F (37.7 C) (09/15 0900) Pulse Rate:  [93-125] 117 (09/15 0900) Resp:  [20-33] 29 (09/15 0900) BP: (99-166)/(41-78) 166/59 mmHg (09/15 0900) SpO2:  [91 %-98 %] 97 % (09/15 0900) FiO2 (%):  [30 %] 30 % (09/15 0835) Weight:  [61.1 kg (134 lb 11.2 oz)] 61.1 kg (134 lb 11.2 oz) (09/15 0500) HEMODYNAMICS:   VENTILATOR SETTINGS: Vent Mode:  [-] CPAP FiO2 (%):  [30 %] 30 % Set Rate:  [14 bmp] 14 bmp Vt Set:  [530 mL] 530 mL PEEP:  [0 cmH20-5 cmH20] 0 cmH20 Pressure Support:  [5 cmH20] 5 cmH20 Plateau Pressure:  [19 cmH20-26 cmH20] 19 cmH20 INTAKE / OUTPUT:  Intake/Output Summary (Last 24 hours) at 06/22/15 0928 Last data filed at 06/22/15 0900  Gross per 24 hour  Intake 1583.92 ml  Output   1875 ml  Net -291.08 ml    PHYSICAL EXAMINATION: General:  Sedated Integument:   Warm & dry. No rash/ulcerations HEENT:  ETT in place. Pupils equal.  Cardiovascular:rrr, trace edema  Pulmonary:  Clear breath sounds bilat, no crackles or wheezing, decreased right base  Abdomen: Soft. Normal bowel sounds. Nondistended.  Neurological:  Patient sedated. Does not follow commands.  LABS:  CBC  Recent Labs Lab 06/21/15 1925 06/22/15 0012 06/22/15 0600  WBC 16.6* 15.3* 14.5*  HGB 9.1* 9.3* 8.9*  HCT 26.0* 26.8* 25.7*  PLT 177 157 143*   Coag's  Recent Labs Lab 06/20/15 0940  APTT 40*  INR 1.22   BMET  Recent Labs Lab 06/20/15 1515 06/21/15 0200 06/22/15 0615  NA 140 141 139  K 4.9 4.4 3.7  CL 120* 120* 115*  CO2 16* 14* 17*  BUN 33* 26* 20  CREATININE 1.22 1.26* 1.32*  GLUCOSE 64* 60* 99   Electrolytes  Recent Labs Lab 06/20/15 0940 06/20/15 1515 06/21/15 0200 06/22/15 0615  CALCIUM  --  7.6* 8.3* 8.4*  MG 1.2*  --  2.3 1.7  PHOS 2.7  --  2.1* 4.8*   Sepsis Markers  Recent Labs Lab 06/20/15 0125 06/20/15 0940 06/20/15 1515  LATICACIDVEN 2.17* 1.4 1.4   ABG  Recent Labs Lab 06/20/15 0315 06/20/15 0650  PHART 7.286* 7.283*  PCO2ART 31.8* 31.9*  PO2ART 80.9 126*   Liver Enzymes  Recent Labs Lab 06/20/15 0115 06/21/15 0200 06/22/15 0615  AST 28  --   --   ALT 26  --   --  ALKPHOS 96  --   --   BILITOT 0.4  --   --   ALBUMIN 3.7 2.7* 2.4*   Cardiac Enzymes  Recent Labs Lab 06/20/15 0940 06/20/15 1830  TROPONINI <0.03 <0.03   Glucose  Recent Labs Lab 06/21/15 1213 06/21/15 1602 06/21/15 1927 06/21/15 2347 06/22/15 0302 06/22/15 0738  GLUCAP 79 82 72 96 103* 95    Imaging Dg Chest Port 1 View  06/22/2015   CLINICAL DATA:  Respiratory failure.  EXAM: PORTABLE CHEST - 1 VIEW  COMPARISON:  06/20/2015.  FINDINGS: Endotracheal tube in stable position. NG tube tip in the stomach. NG tube side hole is at the gastroesophageal junction. Slight advancement of NG tube should be considered. Mediastinum and hilar  structures are normal. Persistent but improving right mid and right lower lobe infiltrate and right pleural effusion. Persistent right base atelectasis. Persistent left lower lobe mild atelectasis and/or infiltrate. Heart size stable. No pulmonary venous congestion. No pneumothorax.  IMPRESSION: 1. Endotracheal tube in good anatomic position . NG tube tip in the stomach. NG tube side hole at the gastroesophageal junction. Slight advanced of the NG tube should be considered . 2. Persistent but improving right mid and right lower lobe infiltrate and right pleural effusion. Persistent right base atelectasis. 3. Persistent left lower lobe mild atelectasis and/or infiltrate .   Electronically Signed   By: Maisie Fus  Register   On: 06/22/2015 07:19     ASSESSMENT / PLAN:  PULMONARY OETT 9/14>> A: Acute Hypoxic Respiratory Failure ESBL Klebsiella PNA Weaning mechanics look good. Mental status barrier to extubation am of 9/16 P:   Full vent support  See ID section Dc propofol gtt Daily SBT Albuterol neb when necessary wheezing  CARDIOVASCULAR A: H/O RBBB Sinus Tachycardia   P:  Monitoring on telemetry Lopressor via tube q12hr  RENAL GU A:  Acute Renal Failure - renal U/S without hyrdonephrosis H/O BPH Hypokalemia  Hyperphosphatemia  Persistent NAG metabolic acidosis in setting of hyperchloremia and bicarb loss.  Lactic Acidosis - resolved  P:   Strict I/O Change IVF to LR Add free water Trending daily BUN/creatinine Monitoring electrolytes daily and replace as needed  Flomax via tube  GASTROINTESTINAL A:   Upper GIB - EGD without obvious source. ? Mallory Weiss tear.  P:   Change CBC to q12hr  Cont PPI per GI-Protonix IV twice a day; GI has signed off  Tube feeds if not extubated 9/16  HEMATOLOGIC A:   Anemia - secondary to Upper GIB. No further signs of bleeding Leukocytosis - improving.  P:  Trend CBC Transfuse per ICU protocol SCDs  INFECTIOUS A:   ESBL  Klebsiella PNA in sputum as of 9/16  P:   Procalcitonin algorithm Trending leukocytosis  Abx: 9/13 unasyn>>9/16 primaxin per pharmacy 9/16>>> Await final sensitivities, may need ID involvement depending of resistance   ENDOCRINE A:   Hypoglycemia r/t ARF-  improved on D10-->off D10, resolved  P:   See renal section  Accu-Cheks every 4 hours  NEUROLOGIC A:   H/O Cerebral Palsy H/O Intellectual Disability H/O Seizure Disorder  P:   RASS goal: 0 to -1 Dc propofol gtt  Fentanyl IV prn Continuing Vimpat Baclofen via tube   FAMILY  - Updates: None at bedside, ward of state. RN notified guardian via phone.  - Inter-disciplinary family meet or Palliative Care meeting due by:  9/20  TODAY'S SUMMARY:   Cont PPI & trending CBC for leukocytosis and anemia. Weaning. Hope to extubate. ESBL klebsiella  in sputum: adding primaxin. Sedation major barrier this am. Will need to start diet either this afternoon via tube feed OR oral diet soon if extubated.  Simonne Martinet ACNP-BC [facility] Pulmonary/Critical Care Pager # [phone] OR # [phone] if no answer

## 2015-06-23 ENCOUNTER — Inpatient Hospital Stay (HOSPITAL_COMMUNITY): Payer: Medicare Other

## 2015-06-23 DIAGNOSIS — J189 Pneumonia, unspecified organism: Secondary | ICD-10-CM | POA: Diagnosis present

## 2015-06-23 LAB — GLUCOSE, CAPILLARY
GLUCOSE-CAPILLARY: 101 mg/dL — AB (ref 65–99)
GLUCOSE-CAPILLARY: 106 mg/dL — AB (ref 65–99)
GLUCOSE-CAPILLARY: 60 mg/dL — AB (ref 65–99)
GLUCOSE-CAPILLARY: 82 mg/dL (ref 65–99)
Glucose-Capillary: 158 mg/dL — ABNORMAL HIGH (ref 65–99)
Glucose-Capillary: 63 mg/dL — ABNORMAL LOW (ref 65–99)
Glucose-Capillary: 67 mg/dL (ref 65–99)
Glucose-Capillary: 73 mg/dL (ref 65–99)
Glucose-Capillary: 88 mg/dL (ref 65–99)
Glucose-Capillary: 97 mg/dL (ref 65–99)

## 2015-06-23 LAB — CBC WITH DIFFERENTIAL/PLATELET
BASOS ABS: 0 10*3/uL (ref 0.0–0.1)
BASOS PCT: 0 %
Basophils Absolute: 0 10*3/uL (ref 0.0–0.1)
Basophils Relative: 0 %
EOS PCT: 6 %
EOS PCT: 8 %
Eosinophils Absolute: 0.7 10*3/uL (ref 0.0–0.7)
Eosinophils Absolute: 0.8 10*3/uL — ABNORMAL HIGH (ref 0.0–0.7)
HCT: 25.2 % — ABNORMAL LOW (ref 39.0–52.0)
HCT: 25.6 % — ABNORMAL LOW (ref 39.0–52.0)
Hemoglobin: 8.6 g/dL — ABNORMAL LOW (ref 13.0–17.0)
Hemoglobin: 8.9 g/dL — ABNORMAL LOW (ref 13.0–17.0)
LYMPHS ABS: 1.1 10*3/uL (ref 0.7–4.0)
LYMPHS PCT: 10 %
Lymphocytes Relative: 15 %
Lymphs Abs: 1.4 10*3/uL (ref 0.7–4.0)
MCH: 32.7 pg (ref 26.0–34.0)
MCH: 33.5 pg (ref 26.0–34.0)
MCHC: 34.1 g/dL (ref 30.0–36.0)
MCHC: 34.8 g/dL (ref 30.0–36.0)
MCV: 95.8 fL (ref 78.0–100.0)
MCV: 96.2 fL (ref 78.0–100.0)
MONO ABS: 0.5 10*3/uL (ref 0.1–1.0)
MONO ABS: 0.7 10*3/uL (ref 0.1–1.0)
Monocytes Relative: 5 %
Monocytes Relative: 7 %
Neutro Abs: 8.7 10*3/uL — ABNORMAL HIGH (ref 1.7–7.7)
Neutro Abs: 6.5 10*3/uL (ref 1.7–7.7)
Neutrophils Relative %: 79 %
Neutrophils Relative %: 70 %
PLATELETS: 126 10*3/uL — AB (ref 150–400)
PLATELETS: 132 10*3/uL — AB (ref 150–400)
RBC: 2.63 MIL/uL — ABNORMAL LOW (ref 4.22–5.81)
RBC: 2.66 MIL/uL — ABNORMAL LOW (ref 4.22–5.81)
RDW: 15.5 % (ref 11.5–15.5)
RDW: 15.3 % (ref 11.5–15.5)
WBC: 11 10*3/uL — ABNORMAL HIGH (ref 4.0–10.5)
WBC: 9.4 10*3/uL (ref 4.0–10.5)

## 2015-06-23 LAB — RENAL FUNCTION PANEL
Albumin: 2.3 g/dL — ABNORMAL LOW (ref 3.5–5.0)
Anion gap: 8 (ref 5–15)
BUN: 16 mg/dL (ref 6–20)
CHLORIDE: 117 mmol/L — AB (ref 101–111)
CO2: 18 mmol/L — AB (ref 22–32)
CREATININE: 1.28 mg/dL — AB (ref 0.61–1.24)
Calcium: 8.3 mg/dL — ABNORMAL LOW (ref 8.9–10.3)
GFR calc Af Amer: >60 mL/min (ref >60)
GFR, EST NON AFRICAN AMERICAN: 58 mL/min — AB (ref >60)
Glucose, Bld: 85 mg/dL (ref 65–99)
Phosphorus: 5.2 mg/dL — ABNORMAL HIGH (ref 2.5–4.6)
Potassium: 3.4 mmol/L — ABNORMAL LOW (ref 3.5–5.1)
Sodium: 143 mmol/L (ref 135–145)

## 2015-06-23 LAB — TRIGLYCERIDES
Triglycerides: 86 mg/dL (ref <150)
Triglycerides: 92 mg/dL (ref <150)

## 2015-06-23 LAB — MAGNESIUM: MAGNESIUM: 1.7 mg/dL (ref 1.7–2.4)

## 2015-06-23 MED ORDER — CHLORHEXIDINE GLUCONATE 0.12 % MT SOLN
OROMUCOSAL | Status: AC
  Administered 2015-06-23: 15 mL via OROMUCOSAL
  Filled 2015-06-23: qty 15

## 2015-06-23 MED ORDER — PROPOFOL 1000 MG/100ML IV EMUL
5.0000 ug/kg/min | INTRAVENOUS | Status: DC
  Administered 2015-06-23: 30 ug/kg/min via INTRAVENOUS
  Administered 2015-06-23: 40 ug/kg/min via INTRAVENOUS
  Administered 2015-06-24: 30 ug/kg/min via INTRAVENOUS
  Administered 2015-06-24: 20 ug/kg/min via INTRAVENOUS
  Administered 2015-06-25: 16 ug/kg/min via INTRAVENOUS
  Administered 2015-06-26: 20 ug/kg/min via INTRAVENOUS
  Filled 2015-06-23 (×7): qty 100

## 2015-06-23 MED ORDER — DEXTROSE 50 % IV SOLN
INTRAVENOUS | Status: AC
  Administered 2015-06-23: 50 mL
  Filled 2015-06-23: qty 50

## 2015-06-23 MED ORDER — CLONIDINE ORAL SUSPENSION 10 MCG/ML
0.2000 mg | Freq: Two times a day (BID) | ORAL | Status: DC
  Administered 2015-06-24 – 2015-06-30 (×9): 0.2 mg
  Filled 2015-06-23 (×33): qty 20

## 2015-06-23 MED ORDER — ESCITALOPRAM OXALATE 20 MG PO TABS
20.0000 mg | ORAL_TABLET | Freq: Every day | ORAL | Status: DC
  Administered 2015-06-24 – 2015-07-12 (×13): 20 mg
  Filled 2015-06-23 (×15): qty 1

## 2015-06-23 MED ORDER — DEXTROSE IN LACTATED RINGERS 5 % IV SOLN
INTRAVENOUS | Status: DC
  Administered 2015-06-23 – 2015-06-28 (×5): via INTRAVENOUS

## 2015-06-23 MED ORDER — FREE WATER
200.0000 mL | Freq: Three times a day (TID) | Status: DC
  Administered 2015-06-23 – 2015-06-26 (×8): 200 mL

## 2015-06-23 MED ORDER — QUETIAPINE FUMARATE 25 MG PO TABS
25.0000 mg | ORAL_TABLET | Freq: Every day | ORAL | Status: DC
  Administered 2015-06-23 – 2015-07-01 (×7): 25 mg
  Filled 2015-06-23 (×7): qty 1

## 2015-06-23 MED ORDER — POTASSIUM CHLORIDE 10 MEQ/100ML IV SOLN
10.0000 meq | INTRAVENOUS | Status: AC
  Administered 2015-06-23 (×2): 10 meq via INTRAVENOUS
  Filled 2015-06-23 (×2): qty 100

## 2015-06-23 MED ORDER — METOPROLOL TARTRATE 25 MG/10 ML ORAL SUSPENSION
50.0000 mg | Freq: Two times a day (BID) | ORAL | Status: DC
  Administered 2015-06-24 – 2015-06-26 (×4): 50 mg
  Filled 2015-06-23 (×8): qty 20

## 2015-06-23 MED ORDER — POTASSIUM CHLORIDE 10 MEQ/100ML IV SOLN: 10.0000 meq | INTRAVENOUS | Status: DC

## 2015-06-23 MED ORDER — DEXTROSE 50 % IV SOLN
INTRAVENOUS | Status: AC
  Administered 2015-06-23: 25 mL
  Filled 2015-06-23: qty 50

## 2015-06-23 MED ORDER — SODIUM CHLORIDE 0.9 % IV SOLN
250.0000 mg | Freq: Four times a day (QID) | INTRAVENOUS | Status: DC
  Administered 2015-06-23 – 2015-06-27 (×17): 250 mg via INTRAVENOUS
  Filled 2015-06-23 (×17): qty 250

## 2015-06-23 NOTE — Progress Notes (Signed)
ANTIBIOTIC CONSULT NOTE - FOLLOW UP  Pharmacy may adjust antibiotics for renal function - Primaxin Indication: pneumonia  No Known Allergies  Patient Measurements: Height:  (170.2 cm) Weight: 136 lb 14.5 oz (62.1 kg) IBW/kg (Calculated) : 66.1  Vital Signs: Temp: 98.6 F (37 C) (09/16 0900) Temp Source: Core (Comment) (09/16 0800) BP: 173/81 mmHg (09/16 1042) Pulse Rate: 117 (09/16 1042) Intake/Output from previous day: 09/15 0701 - 09/16 0700 In: 1813.8 [P.O.:100; I.V.:1433.8; IV Piggyback:280] Out: 2300 [Urine:2300]  Labs:  Recent Labs  06/21/15 0200  06/22/15 0600 06/22/15 0615 06/22/15 1723 06/23/15 0540  WBC 15.4*  < > 14.5*  --  13.8* 11.0*  HGB 9.8*  < > 8.9*  --  8.6* 8.6*  PLT 178  < > 143*  --  139* 126*  CREATININE 1.26*  --   --  1.32*  --  1.28*  < > = values in this interval not displayed. Estimated Creatinine Clearance: 51.2 mL/min (by C-G formula based on Cr of 1.28).   Assessment: 6 yoM with cerebral palsy, presented to ED on 9/13 with GI bleed, aspiration of coffee ground emesis.  She was initially started on Unasyn for suspected aspiration pneumonia.  Sputum cultures now growing ESBL Klebsiella Pneumoniae and antibiotics are changed to Primaxin.  9/13 >> Unasyn >> 9/16 9/16 >> Primaxin >>  Today, 06/23/2015:  Day 4 antibiotics  Tm 100.4  WBC elevated but improved (13.8 > 11)  SCr 1.28 with CrCl ~ 51 ml/min CG (~ 59 ml/min N)   Goal of Therapy:  Appropriate abx dosing, eradication of infection.   Plan:   Primaxin  IV q6h  Follow up renal fxn, culture results, and clinical course.  Lynann Beaver PharmD, BCPS Pager [phone] 06/23/2015 11:09 AM

## 2015-06-23 NOTE — Progress Notes (Signed)
Nutrition Follow-up  DOCUMENTATION CODES:   Not applicable  INTERVENTION:  - If unable to wean from vent, recommend Vital AF 1.2 @ 55 mL/hr which, with current Propofol rate, will provide 1785 kcal (99% estimated needs), 99 grams protein, and 1070 mL free water. - If pt extubated and Propofol turned off, recommend Jevity 1.2 @ 55 mL/hr to provide 1584 kcal, 73 grams protein, and 1065 mL free water. - RD will continue to monitor for needs  NUTRITION DIAGNOSIS:   Inadequate oral intake related to inability to eat as evidenced by NPO status. -ongoing  GOAL:   Patient will meet greater than or equal to 90% of their needs -unmet  MONITOR:   Vent status, Diet advancement, Weight trends, Labs, I & O's, Skin  ASSESSMENT:   64 yo WM with existing CP/MR with a history of HTN, erosive esophagitis, previous GI bleed who presented to [facility] from SNF coffee ground emesis and inability to protect airway and maintain adequate O2 saturations. He was intubated per EDP, started on PPI drip and GI was consulted. He is a ward of state and is a full code. State appt guardian is Moises Blood DDS [phone]. He will be admitted to ICU under PCCM service.  9/16 Patient is currently intubated on ventilator support MV: 11.5 L/min Temp (24hrs), Avg:99.4 F (37.4 C), Min:98.2 F (36.8 C), Max:100.4 F (38 C)  Propofol: 7.6 ml/hr (201 kcal)  Needs have been re-estimated based on current MV and medical course. Pt without nutrition support but NGT in place. Spoke with NP this AM who states weaning of vent in progress with plan to turn off Propofol to trial extubation; he is hopeful if this occurs diet advancement to occur later today versus tomorrow but requests TF recommendations in case needed. Unsure of pt's baseline diet texture tolerance.  TF recommendations outlined above. Will continue to monitor for medical course and needs. Not meeting needs. Medications reviewed. Labs reviewed; K: 3.4 mmol/L, Phos:  5.2 mg/dL, Cl: 098 mmol/L, creatinine elevated, Ca: 8.3 mg/dL, GFR: 58.    1/19 - Patient is currently intubated on ventilator support with OGT in place - MV: 13.9 L/min; Propofol: 13.4 ml/hr (354 kcal) -RN states pt does not have PEG or G-J tube from PTA.  - Per weight hx review, pt has gained 25 lbs since 10/2014. Mild muscle wasting noted to clavicle area and lower legs; no fat wasting. - GI note from this AM indicates no active GIB overnight; will monitor for ability to initiate TF if pt to remain intubated >24 hours. - Recommended Vital AF 1.2 @ 55 mL/hr which will provide, in combination with current Propofol rate, 1938 kcal, 99 grams protein, and 1070 mL free water.  Diet Order:   NPO  Skin:  Reviewed, no issues  Last BM:  9/15  Height:   Ht Readings from Last 1 Encounters:  06/20/15  (1.702 m)    Weight:   Wt Readings from Last 1 Encounters:  06/23/15 136 lb 14.5 oz (62.1 kg)    Ideal Body Weight:  67.27 kg (kg)  BMI:  Body mass index is 21.44 kg/(m^2).  Estimated Nutritional Needs:   Kcal:  1810  Protein:  74-93 grams  Fluid:  1.8 L/day  EDUCATION NEEDS:   No education needs identified at this time     Trenton Gammon, RD, LDN Inpatient Clinical Dietitian Pager # [phone] After hours/weekend pager # [phone]

## 2015-06-24 DIAGNOSIS — J150 Pneumonia due to Klebsiella pneumoniae: Secondary | ICD-10-CM

## 2015-06-24 LAB — CBC WITH DIFFERENTIAL/PLATELET
BASOS ABS: 0 10*3/uL (ref 0.0–0.1)
BASOS ABS: 0 10*3/uL (ref 0.0–0.1)
BASOS PCT: 0 %
Basophils Relative: 0 %
EOS ABS: 0.8 10*3/uL — AB (ref 0.0–0.7)
EOS ABS: 0 10*3/uL (ref 0.0–0.7)
EOS PCT: 12 %
EOS PCT: 0 %
HCT: 23.3 % — ABNORMAL LOW (ref 39.0–52.0)
HCT: 27.9 % — ABNORMAL LOW (ref 39.0–52.0)
Hemoglobin: 8 g/dL — ABNORMAL LOW (ref 13.0–17.0)
Hemoglobin: 9.5 g/dL — ABNORMAL LOW (ref 13.0–17.0)
LYMPHS ABS: 0.2 10*3/uL — AB (ref 0.7–4.0)
Lymphocytes Relative: 16 %
Lymphocytes Relative: 4 %
Lymphs Abs: 1 10*3/uL (ref 0.7–4.0)
MCH: 32.9 pg (ref 26.0–34.0)
MCH: 32.4 pg (ref 26.0–34.0)
MCHC: 34.3 g/dL (ref 30.0–36.0)
MCHC: 34.1 g/dL (ref 30.0–36.0)
MCV: 95.9 fL (ref 78.0–100.0)
MCV: 95.2 fL (ref 78.0–100.0)
MONO ABS: 0.7 10*3/uL (ref 0.1–1.0)
MONO ABS: 0.2 10*3/uL (ref 0.1–1.0)
Monocytes Relative: 10 %
Monocytes Relative: 3 %
Neutro Abs: 4 10*3/uL (ref 1.7–7.7)
Neutro Abs: 4.7 10*3/uL (ref 1.7–7.7)
Neutrophils Relative %: 62 %
Neutrophils Relative %: 93 %
PLATELETS: 135 10*3/uL — AB (ref 150–400)
PLATELETS: 122 10*3/uL — AB (ref 150–400)
RBC: 2.43 MIL/uL — AB (ref 4.22–5.81)
RBC: 2.93 MIL/uL — AB (ref 4.22–5.81)
RDW: 15.2 % (ref 11.5–15.5)
RDW: 13.7 % (ref 11.5–15.5)
WBC: 6.5 10*3/uL (ref 4.0–10.5)
WBC: 5.1 10*3/uL (ref 4.0–10.5)

## 2015-06-24 LAB — CULTURE, RESPIRATORY W GRAM STAIN: Special Requests: NORMAL

## 2015-06-24 LAB — GLUCOSE, CAPILLARY
GLUCOSE-CAPILLARY: 100 mg/dL — AB (ref 65–99)
GLUCOSE-CAPILLARY: 92 mg/dL (ref 65–99)
GLUCOSE-CAPILLARY: 97 mg/dL (ref 65–99)
Glucose-Capillary: 81 mg/dL (ref 65–99)
Glucose-Capillary: 84 mg/dL (ref 65–99)
Glucose-Capillary: 107 mg/dL — ABNORMAL HIGH (ref 65–99)

## 2015-06-24 LAB — CULTURE, RESPIRATORY

## 2015-06-24 LAB — RENAL FUNCTION PANEL
ALBUMIN: 2.3 g/dL — AB (ref 3.5–5.0)
Anion gap: 6 (ref 5–15)
BUN: 14 mg/dL (ref 6–20)
CHLORIDE: 117 mmol/L — AB (ref 101–111)
CO2: 20 mmol/L — ABNORMAL LOW (ref 22–32)
CREATININE: 1.19 mg/dL (ref 0.61–1.24)
Calcium: 8.3 mg/dL — ABNORMAL LOW (ref 8.9–10.3)
Glucose, Bld: 89 mg/dL (ref 65–99)
PHOSPHORUS: 5.4 mg/dL — AB (ref 2.5–4.6)
Potassium: 3.2 mmol/L — ABNORMAL LOW (ref 3.5–5.1)
Sodium: 143 mmol/L (ref 135–145)

## 2015-06-24 LAB — TYPE AND SCREEN
ABO/RH(D): O POS
Antibody Screen: POSITIVE
DAT, IgG: NEGATIVE
Donor AG Type: NEGATIVE
Donor AG Type: NEGATIVE
PT AG TYPE: NEGATIVE
UNIT DIVISION: 0
UNIT DIVISION: 0

## 2015-06-24 LAB — MAGNESIUM: MAGNESIUM: 1.6 mg/dL — AB (ref 1.7–2.4)

## 2015-06-24 LAB — TRIGLYCERIDES: TRIGLYCERIDES: 115 mg/dL (ref <150)

## 2015-06-24 MED ORDER — ALBUTEROL SULFATE (2.5 MG/3ML) 0.083% IN NEBU
2.5000 mg | INHALATION_SOLUTION | Freq: Four times a day (QID) | RESPIRATORY_TRACT | Status: DC
  Administered 2015-06-24 – 2015-06-29 (×17): 2.5 mg via RESPIRATORY_TRACT
  Filled 2015-06-24 (×17): qty 3

## 2015-06-24 MED ORDER — MAGNESIUM SULFATE 2 GM/50ML IV SOLN
2.0000 g | Freq: Once | INTRAVENOUS | Status: AC
  Administered 2015-06-24: 2 g via INTRAVENOUS
  Filled 2015-06-24: qty 50

## 2015-06-24 MED ORDER — POTASSIUM CHLORIDE 20 MEQ/15ML (10%) PO SOLN
40.0000 meq | Freq: Once | ORAL | Status: AC
  Administered 2015-06-24: 40 meq
  Filled 2015-06-24: qty 30

## 2015-06-24 MED ORDER — CHLORHEXIDINE GLUCONATE 0.12 % MT SOLN
OROMUCOSAL | Status: AC
  Administered 2015-06-24: 15 mL via OROMUCOSAL
  Filled 2015-06-24: qty 15

## 2015-06-24 NOTE — Progress Notes (Signed)
eLink Physician-Brief Progress Note Patient Name: Colin Bradley DOB: [birth year] MRN: [id number]   Date of Service  06/24/2015  HPI/Events of Note  Hypokalemia and hypomag  eICU Interventions  Potassium and mag replaced     Intervention Category Intermediate Interventions: Electrolyte abnormality - evaluation and management  DETERDING,ELIZABETH 06/24/2015, 6:35 AM

## 2015-06-24 NOTE — Progress Notes (Signed)
PULMONARY / CRITICAL CARE MEDICINE   Name: Colin Bradley MRN: [id number] DOB: 1950/10/10    ADMISSION DATE:  06/20/2015   REFERRING MD :  EDP  CHIEF COMPLAINT:  GIB  INITIAL PRESENTATION: 64 year old male with known cerebral palsy and intellectual disability is a ward of the state. Presented with coffee-ground emesis on 9/13. Intubated for worsening hypoxic respiratory failure and evidence of aspiration pneumonia on chest x-ray imaging post-intubation.  STUDIES:  Portable CXR 9/13 -  right sided mid and lower lung opacification. Renal US 9/13 - small right renal cyst. Cholelithiasis noted. No hydronephrosis.  SIGNIFICANT EVENTS: 9/13 - Intubated & admitted Upper GIB 9/13 - EGD w/ hiatal hernia but no source of active bleeding 9/14 hyperchloremic acidosis  9/15 - tachycardic, lopressor restarted. Continued on rx for pneumonia  9/16: WBC trending down, hgb stable, still w/ NAG metabolic acidosis.   SUBJECTIVE: No events overnight. Patient with increasing secretions from endotracheal tube & borderline hypoxia on spontaneous breathing trial this morning.   ROS: Unobtainable as the patient is intubated and sedated   VITAL SIGNS: Temp:  [97.5 F (36.4 C)-99 F (37.2 C)] 98.8 F (37.1 C) (09/17 1100) Pulse Rate:  [77-122] 97 (09/17 1100) Resp:  [14-37] 25 (09/17 1100) BP: (90-187)/(39-94) 154/89 mmHg (09/17 1100) SpO2:  [92 %-99 %] 95 % (09/17 1100) FiO2 (%):  [30 %] 30 % (09/17 0758) HEMODYNAMICS:   VENTILATOR SETTINGS: Vent Mode:  [-] PSV FiO2 (%):  [30 %] 30 % Set Rate:  [14 bmp] 14 bmp Vt Set:  [530 mL] 530 mL PEEP:  [5 cmH20] 5 cmH20 Pressure Support:  [10 cmH20] 10 cmH20 Plateau Pressure:  [13 cmH20-19 cmH20] 19 cmH20 INTAKE / OUTPUT:  Intake/Output Summary (Last 24 hours) at 06/24/15 1117 Last data filed at 06/24/15 1000  Gross per 24 hour  Intake 2802.02 ml  Output   1675 ml  Net 1127.02 ml    PHYSICAL EXAMINATION: General:  Sedated. Does open eyes on  command. No distress. Integument:  Warm & dry. No rash. HEENT:  PERRLA. Endotracheal tube in place. No scleral icterus. Cardiovascular: Regular rhythm. Regular rate. No edema. Pulmonary:  Coarse breath sounds bilaterally on ventilator. Symmetric chest wall rise.  Abdomen: Soft. Normal bowel sounds. Nondistended.  Neurological:  Sedated. Not following commands. Does open eyes to voice.  LABS:  CBC  Recent Labs Lab 06/23/15 0540 06/23/15 1735 06/24/15 0506  WBC 11.0* 9.4 6.5  HGB 8.6* 8.9* 8.0*  HCT 25.2* 25.6* 23.3*  PLT 126* 132* 135*   Coag's  Recent Labs Lab 06/20/15 0940  APTT 40*  INR 1.22   BMET  Recent Labs Lab 06/22/15 0615 06/23/15 0540 06/24/15 0506  NA 139 143 143  K 3.7 3.4* 3.2*  CL 115* 117* 117*  CO2 17* 18* 20*  BUN CREATININE 1.32* 1.28* 1.19  GLUCOSE 99 85 89   Electrolytes  Recent Labs Lab 06/22/15 0615 06/23/15 0540 06/24/15 0506  CALCIUM 8.4* 8.3* 8.3*  MG 1.7 1.7 1.6*  PHOS 4.8* 5.2* 5.4*   Sepsis Markers  Recent Labs Lab 06/20/15 0125 06/20/15 0940 06/20/15 1515  LATICACIDVEN 2.17* 1.4 1.4   ABG  Recent Labs Lab 06/20/15 0315 06/20/15 0650  PHART 7.286* 7.283*  PCO2ART 31.8* 31.9*  PO2ART 80.9 126*   Liver Enzymes  Recent Labs Lab 06/20/15 0115  06/22/15 0615 06/23/15 0540 06/24/15 0506  AST 28  --   --   --   --   ALT 26  --   --   --   --  ALKPHOS 96  --   --   --   --   BILITOT 0.4  --   --   --   --   ALBUMIN 3.7  < > 2.4* 2.3* 2.3*  < > = values in this interval not displayed. Cardiac Enzymes  Recent Labs Lab 06/20/15 0940 06/20/15 1830  TROPONINI <0.03 <0.03   Glucose  Recent Labs Lab 06/23/15 1248 06/23/15 1555 06/23/15 1932 06/23/15 1934 06/23/15 2154 06/24/15 0423  GLUCAP 73 82 60* 63* 158* 81    Imaging Dg Abd 1 View  06/23/2015   CLINICAL DATA:  Encounter for orogastric tube placement.  EXAM: ABDOMEN - 1 VIEW  COMPARISON:  June 20, 2015.  FINDINGS: The bowel  gas pattern is normal. Orogastric tube tip is seen in the expected position of the proximal stomach. No radio-opaque calculi or other significant radiographic abnormality are seen.  IMPRESSION: Orogastric tube tip is seen in expected position of the proximal stomach. No evidence of bowel obstruction or ileus.   Electronically Signed   By: Lupita Raider, M.D.   On: 06/23/2015 16:54     ASSESSMENT / PLAN:  PULMONARY OETT 9/14>> A: Acute Hypoxic Respiratory Failure HCAP - ESBL Klebsiella Increasing Resp Secretions  P:   Full vent support  See ID section Daily SBT Albuterol q6hr for increased secretions  CARDIOVASCULAR A: H/O RBBB Sinus Tachycardia   P:  Monitoring on telemetry Lopressor via tube q12hr Clonidine restarted  RENAL GU A:  Acute Renal Failure - renal U/S without hyrdonephrosis H/O BPH Hypokalemia - Replacing Hypomagnesemia - Replacing Hyperchloremia - Due to volume resuscitation. Hyperphosphatemia  Lactic Acidosis - resolved  P:   Strict I/O Continue LR Free H20 via tube Trending daily BUN/creatinine Monitoring electrolytes daily and replace as needed  Flomax via tube  GASTROINTESTINAL A:   Upper GIB - EGD without obvious source. ? Mallory Weiss tear.  P:   Monitoring Hgb daily on CBC Cont PPI per GI-Protonix IV twice a day; GI has signed off  Consider TF if not able to extubate by 9/18  HEMATOLOGIC A:   Anemia - secondary to Upper GIB. No further signs of bleeding Leukocytosis - resolved  P:  Trend CBC daily Transfuse per ICU protocol SCDs  INFECTIOUS A:   HCAP - ESBL Klebsiella  P:   Monitoring for fever  Resp Ctx 9/14>>ESBL Klebsiella  Primaxin 9/16>> Unasyn 9/13>>9/16  ENDOCRINE A:   Hypoglycemia - Resolved  P:   Accu-Cheks every 4 hours  NEUROLOGIC A:   H/O Cerebral Palsy H/O Intellectual Disability H/O Seizure Disorder  P:   RASS goal: 0 to -1 Dc propofol gtt  Fentanyl IV prn Continuing Vimpat Baclofen  via tube   FAMILY  - Updates: None at bedside, ward of state.   - Inter-disciplinary family meet or Palliative Care meeting due by:  9/20  TODAY'S SUMMARY:  Continuing twice a day PPI for GI bleed. Patient's increasing secretions from Klebsiella pneumonia and mental status are major barriers to extubation. May need to initiate tube feeds by 9/18 if unable to extubate.  I have spent a total of 30 minutes of critical care time today caring for this patient & reviewing the electronic medical record.   Donna Christen Jamison Neighbor, M.D. [facility] Pager:  [phone] After 3pm or if no response, call [phone]

## 2015-06-25 LAB — TYPE AND SCREEN
ABO/RH(D): O POS
ANTIBODY SCREEN: POSITIVE
DAT, IGG: NEGATIVE

## 2015-06-25 LAB — CBC WITH DIFFERENTIAL/PLATELET
BASOS ABS: 0 10*3/uL (ref 0.0–0.1)
BASOS ABS: 0 10*3/uL (ref 0.0–0.1)
BASOS PCT: 0 %
BASOS PCT: 0 %
EOS ABS: 0.4 10*3/uL (ref 0.0–0.7)
EOS PCT: 8 %
Eosinophils Absolute: 0.1 10*3/uL (ref 0.0–0.7)
Eosinophils Relative: 3 %
HCT: 21.8 % — ABNORMAL LOW (ref 39.0–52.0)
HEMATOCRIT: 25.2 % — AB (ref 39.0–52.0)
HEMOGLOBIN: 8.5 g/dL — AB (ref 13.0–17.0)
Hemoglobin: 7.5 g/dL — ABNORMAL LOW (ref 13.0–17.0)
Lymphocytes Relative: 28 %
Lymphocytes Relative: 19 %
Lymphs Abs: 1.4 10*3/uL (ref 0.7–4.0)
Lymphs Abs: 0.9 10*3/uL (ref 0.7–4.0)
MCH: 33.3 pg (ref 26.0–34.0)
MCH: 32.8 pg (ref 26.0–34.0)
MCHC: 34.4 g/dL (ref 30.0–36.0)
MCHC: 33.7 g/dL (ref 30.0–36.0)
MCV: 96.9 fL (ref 78.0–100.0)
MCV: 97.3 fL (ref 78.0–100.0)
MONO ABS: 0.5 10*3/uL (ref 0.1–1.0)
MONO ABS: 0.5 10*3/uL (ref 0.1–1.0)
Monocytes Relative: 9 %
Monocytes Relative: 11 %
NEUTROS ABS: 3 10*3/uL (ref 1.7–7.7)
NEUTROS PCT: 67 %
Neutro Abs: 2.7 10*3/uL (ref 1.7–7.7)
Neutrophils Relative %: 55 %
PLATELETS: 137 10*3/uL — AB (ref 150–400)
Platelets: 178 10*3/uL (ref 150–400)
RBC: 2.25 MIL/uL — ABNORMAL LOW (ref 4.22–5.81)
RBC: 2.59 MIL/uL — AB (ref 4.22–5.81)
RDW: 15 % (ref 11.5–15.5)
RDW: 14.9 % (ref 11.5–15.5)
WBC: 4.9 10*3/uL (ref 4.0–10.5)
WBC: 4.5 10*3/uL (ref 4.0–10.5)

## 2015-06-25 LAB — MAGNESIUM: MAGNESIUM: 2 mg/dL (ref 1.7–2.4)

## 2015-06-25 LAB — RENAL FUNCTION PANEL
ALBUMIN: 2.2 g/dL — AB (ref 3.5–5.0)
Anion gap: 7 (ref 5–15)
BUN: 15 mg/dL (ref 6–20)
CO2: 19 mmol/L — ABNORMAL LOW (ref 22–32)
CREATININE: 1.07 mg/dL (ref 0.61–1.24)
Calcium: 8.1 mg/dL — ABNORMAL LOW (ref 8.9–10.3)
Chloride: 117 mmol/L — ABNORMAL HIGH (ref 101–111)
Glucose, Bld: 93 mg/dL (ref 65–99)
PHOSPHORUS: 5.3 mg/dL — AB (ref 2.5–4.6)
Potassium: 3.1 mmol/L — ABNORMAL LOW (ref 3.5–5.1)
Sodium: 143 mmol/L (ref 135–145)

## 2015-06-25 LAB — GLUCOSE, CAPILLARY
GLUCOSE-CAPILLARY: 82 mg/dL (ref 65–99)
Glucose-Capillary: 94 mg/dL (ref 65–99)
Glucose-Capillary: 100 mg/dL — ABNORMAL HIGH (ref 65–99)
Glucose-Capillary: 95 mg/dL (ref 65–99)
Glucose-Capillary: 75 mg/dL (ref 65–99)

## 2015-06-25 LAB — TRIGLYCERIDES: TRIGLYCERIDES: 99 mg/dL (ref <150)

## 2015-06-25 LAB — HEMOGLOBIN AND HEMATOCRIT, BLOOD
HCT: 23.8 % — ABNORMAL LOW (ref 39.0–52.0)
HEMATOCRIT: 22.5 % — AB (ref 39.0–52.0)
HEMOGLOBIN: 7.6 g/dL — AB (ref 13.0–17.0)
HEMOGLOBIN: 8.1 g/dL — AB (ref 13.0–17.0)

## 2015-06-25 MED ORDER — POTASSIUM CHLORIDE 20 MEQ/15ML (10%) PO SOLN
40.0000 meq | ORAL | Status: AC
  Administered 2015-06-25 (×2): 40 meq
  Filled 2015-06-25 (×2): qty 30

## 2015-06-25 NOTE — Progress Notes (Signed)
eLink Physician-Brief Progress Note Patient Name: Colin Bradley DOB: [birth year] MRN: [id number]   Date of Service  06/25/2015  HPI/Events of Note  hypokalemia  eICU Interventions  Potassium replaced     Intervention Category Intermediate Interventions: Electrolyte abnormality - evaluation and management  DETERDING,ELIZABETH 06/25/2015, 5:50 AM

## 2015-06-25 NOTE — Progress Notes (Signed)
PULMONARY / CRITICAL CARE MEDICINE   Name: Colin Bradley MRN: [id number] DOB: 1951-03-31    ADMISSION DATE:  06/20/2015   REFERRING MD :  EDP  CHIEF COMPLAINT:  GIB  INITIAL PRESENTATION: 64 year old male with known cerebral palsy and intellectual disability is a ward of the state. Presented with coffee-ground emesis on 9/13. Intubated for worsening hypoxic respiratory failure and evidence of aspiration pneumonia on chest x-ray imaging post-intubation.  STUDIES:  Portable CXR 9/13 -  right sided mid and lower lung opacification. Renal US 9/13 - small right renal cyst. Cholelithiasis noted. No hydronephrosis.  SIGNIFICANT EVENTS: 9/13 - Intubated & admitted Upper GIB 9/13 - EGD w/ hiatal hernia but no source of active bleeding 9/14 hyperchloremic acidosis  9/15 - tachycardic, lopressor restarted. Continued on rx for pneumonia  9/16: WBC trending down, hgb stable, still w/ NAG metabolic acidosis.  9/18 - hgb down w/o active bleeding. Patient more awake.  SUBJECTIVE: No events overnight. Patient had one bowel movement last night that was normal. No hematochezia. Improving endotracheal secretions. Patient is having copious oral secretions.  ROS: Unobtainable as the patient is intubated and sedated   VITAL SIGNS: Temp:  [98.4 F (36.9 C)-99.5 F (37.5 C)] 99 F (37.2 C) (09/18 0745) Pulse Rate:  [78-117] 94 (09/18 0843) Resp:  [10-26] 18 (09/18 0843) BP: (82-170)/(45-93) 141/68 mmHg (09/18 0835) SpO2:  [92 %-100 %] 97 % (09/18 0835) FiO2 (%):  [30 %] 30 % (09/18 0843) Weight:  [63.1 kg (139 lb 1.8 oz)] 63.1 kg (139 lb 1.8 oz) (09/18 0447) HEMODYNAMICS:   VENTILATOR SETTINGS: Vent Mode:  [-] PSV;CPAP FiO2 (%):  [30 %] 30 % Set Rate:  [14 bmp] 14 bmp Vt Set:  [530 mL] 530 mL PEEP:  [5 cmH20] 5 cmH20 Pressure Support:  [5 cmH20-12 cmH20] 5 cmH20 Plateau Pressure:  [14 cmH20-18 cmH20] 17 cmH20 INTAKE / OUTPUT:  Intake/Output Summary (Last 24 hours) at 06/25/15 0938 Last  data filed at 06/25/15 0700  Gross per 24 hour  Intake 2416.3 ml  Output   1400 ml  Net 1016.3 ml    PHYSICAL EXAMINATION: General:  Eyes open. Patient more alert today. No distress. Integument:  Warm & dry. No rash. HEENT:  PERRLA. Endotracheal tube in place. Copious oral secretions. Cardiovascular: Regular rhythm. Regular rate. No edema. Pulmonary:  Coarse breath sounds bilaterally on ventilator unchanged from yesterday. Symmetric chest wall rise.  Abdomen: Soft. Normal bowel sounds. Nondistended.  Neurological:  Patient tends to voice. Does not follow commands or not to questions. Much more awake this morning.  LABS:  CBC  Recent Labs Lab 06/24/15 0506 06/24/15 1654 06/25/15 0407 06/25/15 0755  WBC 6.5 5.1 4.9  --   HGB 8.0* 9.5* 7.5* 7.6*  HCT 23.3* 27.9* 21.8* 22.5*  PLT 135* 122* 137*  --    Coag's  Recent Labs Lab 06/20/15 0940  APTT 40*  INR 1.22   BMET  Recent Labs Lab 06/23/15 0540 06/24/15 0506 06/25/15 0407  NA 143 143 143  K 3.4* 3.2* 3.1*  CL 117* 117* 117*  CO2 18* 20* 19*  BUN CREATININE 1.28* 1.19 1.07  GLUCOSE 85 89 93   Electrolytes  Recent Labs Lab 06/23/15 0540 06/24/15 0506 06/25/15 0407  CALCIUM 8.3* 8.3* 8.1*  MG 1.7 1.6* 2.0  PHOS 5.2* 5.4* 5.3*   Sepsis Markers  Recent Labs Lab 06/20/15 0125 06/20/15 0940 06/20/15 1515  LATICACIDVEN 2.17* 1.4 1.4   ABG  Recent Labs  Lab 06/20/15 0315 06/20/15 0650  PHART 7.286* 7.283*  PCO2ART 31.8* 31.9*  PO2ART 80.9 126*   Liver Enzymes  Recent Labs Lab 06/20/15 0115  06/23/15 0540 06/24/15 0506 06/25/15 0407  AST 28  --   --   --   --   ALT 26  --   --   --   --   ALKPHOS 96  --   --   --   --   BILITOT 0.4  --   --   --   --   ALBUMIN 3.7  < > 2.3* 2.3* 2.2*  < > = values in this interval not displayed. Cardiac Enzymes  Recent Labs Lab 06/20/15 0940 06/20/15 1830  TROPONINI <0.03 <0.03   Glucose  Recent Labs Lab 06/24/15 1245  06/24/15 1628 06/24/15 2001 06/24/15 2356 06/25/15 0347 06/25/15 0751  GLUCAP 107* 97 100* 92 94 82    Imaging No results found.   ASSESSMENT / PLAN:  PULMONARY OETT 9/14>> A: Acute Hypoxic Respiratory Failure HCAP - ESBL Klebsiella Improving Resp Secretions  P:   Full vent support  See ID section Daily SBT Albuterol q6hr   CARDIOVASCULAR A: H/O RBBB Sinus Tachycardia   P:  Monitoring on telemetry Lopressor via tube q12hr Clonidine   RENAL GU A:  Acute Renal Failure - Resolved. Renal U/S without hyrdonephrosis H/O BPH Hypokalemia - Replacing Hypomagnesemia - Resolved Hyperchloremia - Stable. Due to volume resuscitation. Hyperphosphatemia - Improving. Lactic Acidosis - Resolved.  P:   Strict I/O Continue LR Free H20 via tube Trending daily BUN/creatinine Monitoring electrolytes daily and replace as needed  Flomax via tube  GASTROINTESTINAL A:   Upper GIB - EGD without obvious source. ? Mallory Weiss tear.  P:   Monitoring Hgb daily on CBC Repeat Hgb/Hct @ 1500 today Cont PPI per GI-Protonix IV twice a day; GI has signed off  Consider TF if not able to extubate by 9/18 & no active bleeding  HEMATOLOGIC A:   Anemia - secondary to Upper GIB. No further signs of bleeding but Hgb trending down. Leukocytosis - resolved  P:  Repeat Hgb/Hct @ 1500 today Trend Hgb daily w/ CBC  Transfuse per ICU protocol SCDs  INFECTIOUS A:   HCAP - ESBL Klebsiella  P:   Monitoring for fever  Resp Ctx 9/14>>ESBL Klebsiella  Primaxin 9/16>> Unasyn 9/13>>9/16  ENDOCRINE A:   Hypoglycemia - Resolved  P:   Accu-Cheks every 4 hours  NEUROLOGIC A:   H/O Cerebral Palsy H/O Intellectual Disability H/O Seizure Disorder  P:   RASS goal: 0 to -1 Dc propofol gtt  Fentanyl IV prn Continuing Vimpat Baclofen via tube   FAMILY  - Updates: None at bedside, ward of state.   - Inter-disciplinary family meet or Palliative Care meeting due by:   9/20  TODAY'S SUMMARY:  Continuing twice a day PPI for GI bleed. Respiratory secretions are improving but I remain concerned that hemoglobin is downward trending. No obvious source of active bleeding. Patient will remain intubated for now. With improvement in mental status we may be able to extubate within the next 24 hours if he has no recurrence of bleeding.  I have spent a total of 33 minutes of critical care time today caring for this patient & reviewing the electronic medical record.  Donna Christen Jamison Neighbor, M.D. [facility] Pager:  [phone] After 3pm or if no response, call [phone]

## 2015-06-26 DIAGNOSIS — J189 Pneumonia, unspecified organism: Secondary | ICD-10-CM

## 2015-06-26 DIAGNOSIS — F79 Unspecified intellectual disabilities: Secondary | ICD-10-CM

## 2015-06-26 DIAGNOSIS — J9601 Acute respiratory failure with hypoxia: Secondary | ICD-10-CM

## 2015-06-26 DIAGNOSIS — K209 Esophagitis, unspecified: Secondary | ICD-10-CM

## 2015-06-26 DIAGNOSIS — G809 Cerebral palsy, unspecified: Secondary | ICD-10-CM

## 2015-06-26 LAB — RENAL FUNCTION PANEL
ALBUMIN: 2.2 g/dL — AB (ref 3.5–5.0)
ANION GAP: 9 (ref 5–15)
BUN: 14 mg/dL (ref 6–20)
CALCIUM: 8.5 mg/dL — AB (ref 8.9–10.3)
CO2: 18 mmol/L — ABNORMAL LOW (ref 22–32)
Chloride: 116 mmol/L — ABNORMAL HIGH (ref 101–111)
Creatinine, Ser: 1.04 mg/dL (ref 0.61–1.24)
GFR calc non Af Amer: >60 mL/min (ref >60)
GLUCOSE: 102 mg/dL — AB (ref 65–99)
PHOSPHORUS: 4.5 mg/dL (ref 2.5–4.6)
POTASSIUM: 3.5 mmol/L (ref 3.5–5.1)
SODIUM: 143 mmol/L (ref 135–145)

## 2015-06-26 LAB — CBC WITH DIFFERENTIAL/PLATELET
BASOS ABS: 0 10*3/uL (ref 0.0–0.1)
BASOS PCT: 0 %
Basophils Absolute: 0 10*3/uL (ref 0.0–0.1)
Basophils Relative: 0 %
EOS ABS: 0.4 10*3/uL (ref 0.0–0.7)
EOS ABS: 0.4 10*3/uL (ref 0.0–0.7)
Eosinophils Relative: 7 %
Eosinophils Relative: 8 %
HEMATOCRIT: 21 % — AB (ref 39.0–52.0)
HEMATOCRIT: 23.1 % — AB (ref 39.0–52.0)
HEMOGLOBIN: 7.1 g/dL — AB (ref 13.0–17.0)
HEMOGLOBIN: 7.6 g/dL — AB (ref 13.0–17.0)
LYMPHS ABS: 1.1 10*3/uL (ref 0.7–4.0)
Lymphocytes Relative: 25 %
Lymphocytes Relative: 26 %
Lymphs Abs: 1.3 10*3/uL (ref 0.7–4.0)
MCH: 33.2 pg (ref 26.0–34.0)
MCH: 32.1 pg (ref 26.0–34.0)
MCHC: 33.8 g/dL (ref 30.0–36.0)
MCHC: 32.9 g/dL (ref 30.0–36.0)
MCV: 98.1 fL (ref 78.0–100.0)
MCV: 97.5 fL (ref 78.0–100.0)
MONOS PCT: 12 %
MONOS PCT: 13 %
Monocytes Absolute: 0.6 10*3/uL (ref 0.1–1.0)
Monocytes Absolute: 0.6 10*3/uL (ref 0.1–1.0)
NEUTROS ABS: 2.9 10*3/uL (ref 1.7–7.7)
NEUTROS ABS: 2.3 10*3/uL (ref 1.7–7.7)
NEUTROS PCT: 56 %
NEUTROS PCT: 53 %
Platelets: 178 10*3/uL (ref 150–400)
Platelets: 183 10*3/uL (ref 150–400)
RBC: 2.14 MIL/uL — AB (ref 4.22–5.81)
RBC: 2.37 MIL/uL — AB (ref 4.22–5.81)
RDW: 14.9 % (ref 11.5–15.5)
RDW: 14.7 % (ref 11.5–15.5)
WBC: 5.2 10*3/uL (ref 4.0–10.5)
WBC: 4.4 10*3/uL (ref 4.0–10.5)

## 2015-06-26 LAB — GLUCOSE, CAPILLARY
GLUCOSE-CAPILLARY: 100 mg/dL — AB (ref 65–99)
GLUCOSE-CAPILLARY: 109 mg/dL — AB (ref 65–99)
Glucose-Capillary: 83 mg/dL (ref 65–99)
Glucose-Capillary: 130 mg/dL — ABNORMAL HIGH (ref 65–99)
Glucose-Capillary: 88 mg/dL (ref 65–99)

## 2015-06-26 LAB — MAGNESIUM: MAGNESIUM: 1.9 mg/dL (ref 1.7–2.4)

## 2015-06-26 LAB — TRIGLYCERIDES: TRIGLYCERIDES: 122 mg/dL (ref <150)

## 2015-06-26 MED ORDER — FENTANYL CITRATE (PF) 100 MCG/2ML IJ SOLN: 12.5000 ug | INTRAMUSCULAR | Status: DC | PRN

## 2015-06-26 MED ORDER — METOPROLOL TARTRATE 1 MG/ML IV SOLN
5.0000 mg | Freq: Four times a day (QID) | INTRAVENOUS | Status: DC
  Administered 2015-06-26 – 2015-07-01 (×20): 5 mg via INTRAVENOUS
  Filled 2015-06-26 (×23): qty 5

## 2015-06-26 MED ORDER — CHLORHEXIDINE GLUCONATE 0.12 % MT SOLN
15.0000 mL | Freq: Two times a day (BID) | OROMUCOSAL | Status: DC
  Administered 2015-06-26 – 2015-07-04 (×15): 15 mL via OROMUCOSAL
  Filled 2015-06-26 (×14): qty 15

## 2015-06-26 MED ORDER — CETYLPYRIDINIUM CHLORIDE 0.05 % MT LIQD
7.0000 mL | Freq: Two times a day (BID) | OROMUCOSAL | Status: DC
  Administered 2015-06-26 – 2015-07-04 (×18): 7 mL via OROMUCOSAL

## 2015-06-26 MED ORDER — POTASSIUM CHLORIDE 20 MEQ/15ML (10%) PO SOLN
20.0000 meq | ORAL | Status: AC
  Administered 2015-06-26 (×2): 20 meq
  Filled 2015-06-26 (×2): qty 15

## 2015-06-26 NOTE — Progress Notes (Signed)
Date:  Sept. 19, 2016 U.R. performed for needs and level of care. Will continue to follow for Case Management needs.  Rhonda Davis, RN, BSN, CCM   [phone] 

## 2015-06-26 NOTE — Evaluation (Signed)
SLP Cancellation Note  Patient Details Name: MICHAELANTHONY KEMPTON MRN: [id number] DOB: Feb 15, 1951   Cancelled treatment:       Reason Eval/Treat Not Completed: Medical issues which prohibited therapy (pt in process of being extubated now, per Dr Jamison Neighbor note from 9/18, pt had copious secretions.  Given secretion management issue and intubation x6 days, recommend defer eval until next date, will follow up.)  Donavan Burnet, MS [facility] SLP [phone]

## 2015-06-26 NOTE — Procedures (Signed)
Extubation Procedure Note  Patient Details:   Name: Colin Bradley DOB: [birth year] MRN: [id number]   Airway Documentation:  Airway 7.5 mm (Active)  Secured at (cm) 23 cm 06/26/2015  7:58 AM  Measured From Lips 06/26/2015  7:58 AM  Secured Location Right 06/26/2015  7:58 AM  Secured By [employer] 06/26/2015  7:58 AM  Tube Holder Repositioned Yes 06/26/2015  7:58 AM  Cuff Pressure (cm H2O) 28 cm H2O 06/26/2015  7:58 AM  Site Condition Dry 06/26/2015  7:58 AM    Evaluation  O2 sats: stable throughout Complications: No apparent complications Patient did procedure well. Bilateral Breath Sounds: Clear Suctioning: Airway No  Suzan Garibaldi 06/26/2015, 11:24 AM

## 2015-06-26 NOTE — Progress Notes (Signed)
[facility] ADULT ICU REPLACEMENT PROTOCOL FOR AM LAB REPLACEMENT ONLY  The patient does apply for the [facility] Adult ICU Electrolyte Replacment Protocol based on the criteria listed below:   1. Is GFR >/= 40 ml/min? Yes.    Patient's GFR today is >60 2. Is urine output >/= 0.5 ml/kg/hr for the last 6 hours? Yes.   Patient's UOP is 0.80 ml/kg/hr 3. Is BUN < 60 mg/dL? Yes.    Patient's BUN today is 14 4. Abnormal electrolyte(s):Potassium 3.5 5. Ordered repletion with: Potassium per protocol     LANTZY, JEAN P 06/26/2015 5:03 AM

## 2015-06-26 NOTE — Clinical Documentation Improvement (Signed)
Critical Care  Possible Clinical Conditions:  - Functional Quadriplegia 2/2 Cerebral Palsy  - Other condition  - Unable to clinically determine  Clinical Information:  - Known history of Cerebral Palsy  - Braden Score - "14" per nursing  - "frail CP pt with contractures" documented in H&P   - Bilateral lower extremities are nonweightbearing and noted to have contractures per nursing assessment  - Patient requires repositioning every 2 hours per nursing assessment.  (If you agree, please document your response in the progress notes and not on the query form itself.)   Thank You,  Jerral Ralph  RN BSN CCDS [phone] Health Information Management [place] [phone]

## 2015-06-26 NOTE — Progress Notes (Signed)
PULMONARY / CRITICAL CARE MEDICINE   Name: Colin Bradley MRN: [id number] DOB: [birth year]    ADMISSION DATE:  06/20/2015   REFERRING MD :  EDP  CHIEF COMPLAINT:  GIB  INITIAL PRESENTATION: 64 year old male with known cerebral palsy and intellectual disability is a ward of the state. Presented with coffee-ground emesis on 9/13. Intubated for worsening hypoxic respiratory failure and evidence of aspiration pneumonia on chest x-ray imaging post-intubation.  STUDIES:  Portable CXR 9/13 -  right sided mid and lower lung opacification. Renal US 9/13 - small right renal cyst. Cholelithiasis noted. No hydronephrosis.  SIGNIFICANT EVENTS: 9/13 - Intubated & admitted Upper GIB 9/13 - EGD w/ hiatal hernia but no source of active bleeding 9/14 hyperchloremic acidosis  9/15 - tachycardic, lopressor restarted. Continued on rx for pneumonia  9/16: WBC trending down, hgb stable, still w/ NAG metabolic acidosis.  9/18 - hgb down w/o active bleeding. Patient more awake.  SUBJECTIVE: weaned most of day yesterday; Hgb down some   VITAL SIGNS: Temp:  [98 F (36.7 C)-100.7 F (38.2 C)] 99.3 F (37.4 C) (09/19 1000) Pulse Rate:  [66-115] 71 (09/19 1000) Resp:  [14-27] 14 (09/19 1000) BP: (116-181)/(55-86) 129/67 mmHg (09/19 1000) SpO2:  [96 %-100 %] 98 % (09/19 1000) FiO2 (%):  [30 %] 30 % (09/19 1000) Weight:  [136 lb 0.4 oz (61.7 kg)] 136 lb 0.4 oz (61.7 kg) (09/19 0500) HEMODYNAMICS:   VENTILATOR SETTINGS: Vent Mode:  [-] CPAP FiO2 (%):  [30 %] 30 % Set Rate:  [14 bmp] 14 bmp Vt Set:  [530 mL] 530 mL PEEP:  [5 cmH20] 5 cmH20 Pressure Support:  [5 cmH20-10 cmH20] 5 cmH20 Plateau Pressure:  [18 cmH20-19 cmH20] 18 cmH20 INTAKE / OUTPUT:  Intake/Output Summary (Last 24 hours) at 06/26/15 1052 Last data filed at 06/26/15 1000  Gross per 24 hour  Intake 1698.77 ml  Output   1605 ml  Net  93.77 ml    PHYSICAL EXAMINATION: General : awake, follows commands, on vent HENT: NCAT ETT  in place PULM: vent supported breaths, clear to auscultation CV: RRR, no mgr GI: BS+, soft, nontender MSK: no obvious deformities Neuro: Awake, follows simple commands, nods head to questions  LABS:  CBC  Recent Labs Lab 06/25/15 0407  06/25/15 1427 06/25/15 1714 06/26/15 0414  WBC 4.9  --   --  4.5 5.2  HGB 7.5*  < > 8.1* 8.5* 7.1*  HCT 21.8*  < > 23.8* 25.2* 21.0*  PLT 137*  --   --  178 178  < > = values in this interval not displayed. Coag's  Recent Labs Lab 06/20/15 0940  APTT 40*  INR 1.22   BMET  Recent Labs Lab 06/24/15 0506 06/25/15 0407 06/26/15 0414  NA 143 143 143  K 3.2* 3.1* 3.5  CL 117* 117* 116*  CO2 20* 19* 18*  BUN CREATININE 1.19 1.07 1.04  GLUCOSE 89 93 102*   Electrolytes  Recent Labs Lab 06/24/15 0506 06/25/15 0407 06/26/15 0414  CALCIUM 8.3* 8.1* 8.5*  MG 1.6* 2.0 1.9  PHOS 5.4* 5.3* 4.5   Sepsis Markers  Recent Labs Lab 06/20/15 0125 06/20/15 0940 06/20/15 1515  LATICACIDVEN 2.17* 1.4 1.4   ABG  Recent Labs Lab 06/20/15 0315 06/20/15 0650  PHART 7.286* 7.283*  PCO2ART 31.8* 31.9*  PO2ART 80.9 126*   Liver Enzymes  Recent Labs Lab 06/20/15 0115  06/24/15 0506 06/25/15 0407 06/26/15 0414  AST 28  --   --   --   --  ALT 26  --   --   --   --   ALKPHOS 96  --   --   --   --   BILITOT 0.4  --   --   --   --   ALBUMIN 3.7  < > 2.3* 2.2* 2.2*  < > = values in this interval not displayed. Cardiac Enzymes  Recent Labs Lab 06/20/15 0940 06/20/15 1830  TROPONINI <0.03 <0.03   Glucose  Recent Labs Lab 06/25/15 0751 06/25/15 1227 06/25/15 1650 06/25/15 2130 06/26/15 0047 06/26/15 0758  GLUCAP 82 100* 95 75 109* 83    Imaging No results found.   ASSESSMENT / PLAN:  PULMONARY OETT 9/14>> 9/19 A: Acute Hypoxic Respiratory Failure > resolved HCAP - ESBL Klebsiella Improving Resp Secretions  P:   Extubate today SLP exam after extubation Monitor O2 saturation, use O2 as  needed  CARDIOVASCULAR A: H/O RBBB Sinus Tachycardia  > resolved  P:  Monitoring on telemetry Convert metoprolol to IV while NPO Clonidine to continue  RENAL GU A:  Acute Renal Failure - Resolved H/O BPH Non-anion gap acidosis > due to volume resuscitation  P:   Monitor BMET and UOP Replace electrolytes as needed Flomax via tube  GASTROINTESTINAL A:   Upper GIB - EGD with erosions GEJ, likely prior source? Mallory weiss?, no active bleeding; doesn't seem to be bleeding 9/19 but need to follow H/H closely P:   Monitoring Hgb daily on CBC Repeat Hgb/Hct @ 1500 today Cont PPI per GI-Protonix IV twice a day; GI has signed off  NPO> will need SLP eval  HEMATOLOGIC A:   Anemia - secondary to Upper GIB. No further signs of bleeding but Hgb trending down Leukocytosis - resolved  P:  Repeat Hgb/Hct @ 1500 today Trend Hgb daily w/ CBC  Transfuse for Hgb < 7gm/dL SCDs  INFECTIOUS A:   HCAP - ESBL Klebsiella  P:   Monitoring for fever  Resp Ctx 9/14>>ESBL Klebsiella  Primaxin 9/16>> Unasyn 9/13>>9/16  ENDOCRINE A:   Hypoglycemia - Resolved  P:   Accu-Cheks every 4 hours  NEUROLOGIC A:   H/O Cerebral Palsy H/O Intellectual Disability H/O Seizure Disorder  P:   Fentanyl IV prn Continuing Vimpat Baclofen to continue   FAMILY  - Updates: None at bedside, ward of state.   - Inter-disciplinary family meet or Palliative Care meeting due by:  9/20  TODAY'S SUMMARY:  Extubate today; monitor down trending H/H closely; may need repeat GI evalutation  I have spent a total of 32 minutes of critical care time today caring for this patient & reviewing the electronic medical record.  Heber [place], MD [facility] Pager: [phone] Cell: [phone] After 3pm or if no response, call [phone]

## 2015-06-26 NOTE — Clinical Social Work Note (Signed)
Clinical Social Work Assessment  Patient Details  Name: Colin Bradley MRN: [id number] Date of Birth: 09/18/1951  Date of referral:  06/26/15               Reason for consult:  Discharge Planning, Facility Placement                Permission sought to share information with:    Permission granted to share information::     Name::        Agency::     Relationship::     Contact Information:     Housing/Transportation Living arrangements for the past 2 months:  Assisted Living Facility Source of Information:  Facility Patient Interpreter Needed:  None Criminal Activity/Legal Involvement Pertinent to Current Situation/Hospitalization:  No - Comment as needed Significant Relationships:  Other(Comment) (guardian) Lives with:  Facility Resident Do you feel safe going back to the place where you live?  Yes Need for family participation in patient care:  Yes (Comment)  Care giving concerns: Guardian contacted . No concerns reported.  Social Worker assessment / plan:  Pt hospitalized on 06/20/15 vomiting blood. Pt is a resident from [facility]. CSW spoke with Porscha  ( nsg ) from ALF reports that pt required total assistance with all ADLs except for feeding. [facility] would like to readmit pt, when stable, providing IV treatment is not required. CSW spoke with Lissa Merlin, DSS guardian, and d/c plan has been confirmed. CSW will continue to follow to assist with d/c planning needs. Pt is unable to participate in d/c planning due to cognitive status.  Employment status:  Disabled (Comment on whether or not currently receiving Disability) Insurance information:  Managed Medicare PT Recommendations:  Not assessed at this time Information / Referral to community resources:   (No info required at this time.)  Patient/Family's Response to care:  Guardian agrees with plan to return to [facility] at d/c, if IV's are not required.  Patient/Family's Understanding of and Emotional Response to  Diagnosis, Current Treatment, and Prognosis:  Guardian is aware of pt's medical status. She is pleased pt was pleased pt appears to have tolerated being extubated today.   Emotional Assessment Appearance:  Appears stated age Attitude/Demeanor/Rapport:  Unable to Assess Affect (typically observed):  Calm Orientation:  Oriented to Self Alcohol / Substance use:  Not Applicable Psych involvement (Current and /or in the community):  No (Comment)  Discharge Needs  Concerns to be addressed:  Discharge Planning Concerns Readmission within the last 30 days:  No Current discharge risk:  None Barriers to Discharge:  No Barriers Identified   Royetta Asal, LCSW [phone] 06/26/2015, 3:37 PM

## 2015-06-27 ENCOUNTER — Inpatient Hospital Stay (HOSPITAL_COMMUNITY): Payer: Medicare Other

## 2015-06-27 LAB — CBC WITH DIFFERENTIAL/PLATELET
BASOS PCT: 1 %
Basophils Absolute: 0 10*3/uL (ref 0.0–0.1)
EOS ABS: 0.4 10*3/uL (ref 0.0–0.7)
EOS PCT: 8 %
HCT: 24.7 % — ABNORMAL LOW (ref 39.0–52.0)
Hemoglobin: 8.3 g/dL — ABNORMAL LOW (ref 13.0–17.0)
Lymphocytes Relative: 23 %
Lymphs Abs: 1.2 10*3/uL (ref 0.7–4.0)
MCH: 32.8 pg (ref 26.0–34.0)
MCHC: 33.6 g/dL (ref 30.0–36.0)
MCV: 97.6 fL (ref 78.0–100.0)
MONO ABS: 0.5 10*3/uL (ref 0.1–1.0)
MONOS PCT: 9 %
Neutro Abs: 3.2 10*3/uL (ref 1.7–7.7)
Neutrophils Relative %: 59 %
PLATELETS: 209 10*3/uL (ref 150–400)
RBC: 2.53 MIL/uL — ABNORMAL LOW (ref 4.22–5.81)
RDW: 14.4 % (ref 11.5–15.5)
WBC: 5.3 10*3/uL (ref 4.0–10.5)

## 2015-06-27 LAB — BASIC METABOLIC PANEL
Anion gap: 11 (ref 5–15)
BUN: 15 mg/dL (ref 6–20)
CALCIUM: 8.4 mg/dL — AB (ref 8.9–10.3)
CO2: 19 mmol/L — AB (ref 22–32)
CREATININE: 0.96 mg/dL (ref 0.61–1.24)
Chloride: 113 mmol/L — ABNORMAL HIGH (ref 101–111)
GFR calc non Af Amer: >60 mL/min (ref >60)
Glucose, Bld: 94 mg/dL (ref 65–99)
Potassium: 3.6 mmol/L (ref 3.5–5.1)
SODIUM: 143 mmol/L (ref 135–145)

## 2015-06-27 LAB — GLUCOSE, CAPILLARY
GLUCOSE-CAPILLARY: 81 mg/dL (ref 65–99)
GLUCOSE-CAPILLARY: 96 mg/dL (ref 65–99)
Glucose-Capillary: 80 mg/dL (ref 65–99)
Glucose-Capillary: 81 mg/dL (ref 65–99)
Glucose-Capillary: 75 mg/dL (ref 65–99)
Glucose-Capillary: 93 mg/dL (ref 65–99)
Glucose-Capillary: 73 mg/dL (ref 65–99)

## 2015-06-27 LAB — RENAL FUNCTION PANEL
ALBUMIN: 2.5 g/dL — AB (ref 3.5–5.0)
Anion gap: 11 (ref 5–15)
BUN: 15 mg/dL (ref 6–20)
CALCIUM: 8.5 mg/dL — AB (ref 8.9–10.3)
CO2: 19 mmol/L — ABNORMAL LOW (ref 22–32)
Chloride: 112 mmol/L — ABNORMAL HIGH (ref 101–111)
Creatinine, Ser: 0.96 mg/dL (ref 0.61–1.24)
GFR calc Af Amer: >60 mL/min (ref >60)
GLUCOSE: 93 mg/dL (ref 65–99)
PHOSPHORUS: 4.2 mg/dL (ref 2.5–4.6)
Potassium: 3.6 mmol/L (ref 3.5–5.1)
SODIUM: 142 mmol/L (ref 135–145)

## 2015-06-27 LAB — TRIGLYCERIDES: Triglycerides: 113 mg/dL (ref <150)

## 2015-06-27 LAB — MAGNESIUM: Magnesium: 1.9 mg/dL (ref 1.7–2.4)

## 2015-06-27 MED ORDER — SODIUM CHLORIDE 0.9 % IV SOLN
500.0000 mg | Freq: Three times a day (TID) | INTRAVENOUS | Status: DC
  Administered 2015-06-27 – 2015-07-06 (×26): 500 mg via INTRAVENOUS
  Filled 2015-06-27 (×31): qty 500

## 2015-06-27 MED ORDER — POTASSIUM CHLORIDE 10 MEQ/100ML IV SOLN
10.0000 meq | INTRAVENOUS | Status: AC
  Administered 2015-06-27 (×2): 10 meq via INTRAVENOUS
  Filled 2015-06-27 (×2): qty 100

## 2015-06-27 NOTE — Progress Notes (Signed)
Nutrition Follow-up  INTERVENTION:   If patient fails swallow evaluation, TF recommendations: Initiate Jevity 1.2 @ 15 ml/hr and increase by 10 every 4 hours to goal rate of 55 mL/hr to provide 1584 kcal, 73 grams protein, and 1065 mL free water.  RD to continue to monitor for plan  NUTRITION DIAGNOSIS:   Inadequate oral intake related to inability to eat as evidenced by NPO status.  Ongoing.  GOAL:   Patient will meet greater than or equal to 90% of their needs  Not meeting.  MONITOR:   Labs, Weight trends, Skin, I & O's, Other (Comment) (Need for nutrition support)  REASON FOR ASSESSMENT:   Ventilator, Low Braden    ASSESSMENT:   64 yo WM with existing CP/MR with a history of HTN, erosive esophagitis, previous GI bleed who presented to [facility] from SNF coffee ground emesis and inability to protect airway and maintain adequate O2 saturations. He was intubated per EDP, started on PPI drip and GI was consulted. He is a ward of state and is a full code. State appt guardian is Moises Blood DDS [phone]. He will be admitted to ICU under PCCM service.  Patient extubated 9/19. Pt has pending swallow evaluation. If pt fails swallow eval, will need tube feeding. Recommendations provided above.   Labs reviewed.  Diet Order:  Diet NPO time specified  Skin:  Reviewed, no issues  Last BM:  9/20  Height:   Ht Readings from Last 1 Encounters:  06/20/15  (1.702 m)    Weight:   Wt Readings from Last 1 Encounters:  06/27/15 135 lb 5.8 oz (61.4 kg)    Ideal Body Weight:  67.27 kg (kg)  BMI:  Body mass index is 21.2 kg/(m^2).  Estimated Nutritional Needs:   Kcal:  1400-1600  Protein:  75-85g  Fluid:  1.5L/day  EDUCATION NEEDS:   No education needs identified at this time  Tilda Franco, MS, RD, LDN Pager: [phone] After Hours Pager: [phone]

## 2015-06-27 NOTE — Evaluation (Signed)
Clinical/Bedside Swallow Evaluation Patient Details  Name: Colin Bradley MRN: [id number] Date of Birth: [birth year]  Today's Date: 06/27/2015 Time: SLP Start Time (ACUTE ONLY): 1205 SLP Stop Time (ACUTE ONLY): 1222 SLP Time Calculation (min) (ACUTE ONLY): 17 min  Past Medical History:  Past Medical History  Diagnosis Date  . Seizures   . Cerebral palsy   . Hypertension   . Mental retardation   . Depression   . Nephrolithiasis   . BPH (benign prostatic hyperplasia)   . GI bleed 2009    coffee ground emesis, erosive esophagitis on EGD by Dr Juanda Chance  . Esophageal stricture 2009    not dilated during the EGD, biopsy benign:  no Barretts  . Depression with anxiety    Past Surgical History:  Past Surgical History  Procedure Laterality Date  . Basal cell carcinoma excision  2002    forehead  . Squamous cell carcinoma excision  2004    lip  . Bowen dz  2002    right cheek   . Esophagogastroduodenoscopy  06/14/2012    Procedure: ESOPHAGOGASTRODUODENOSCOPY (EGD);  Surgeon: Rachael Fee, MD;  Location: [facility] ENDOSCOPY;  Service: Endoscopy;  Laterality: N/A;  . Multiple extractions with alveoloplasty N/A 06/20/2014    Procedure: MULTIPLE EXTRACTIONS TEETH NUMBER TWO, TEN, TWELVE, EIGHTEEN, TWENTY-ONE, TWENTY-TWO, TWENTY-NINE, THIRTY-TWO;  Surgeon: Georgia Lopes, DDS;  Location: [facility] OR;  Service: Oral Surgery;  Laterality: N/A;  . Esophagogastroduodenoscopy N/A 06/20/2015    Procedure: ESOPHAGOGASTRODUODENOSCOPY (EGD);  Surgeon: Ruffin Frederick, MD;  Location: [facility] ENDOSCOPY;  Service: Gastroenterology;  Laterality: N/A;   HPI:  64 yo male with h/o CP, intellectual disability, coffee ground emesis 9/13 - required intubation.  Pt extubated 06/26/15, swallow evaluation ordered.  Pt found to have right mid and lower lobe infiltrate.  EGD showing HH and no active bleed 9/13.    Pt resides at group home and is on a regular/chopped diet prior to admission.  He is a ward of the state.    Assessment / Plan / Recommendation Clinical Impression  Pt generalized weakness noted - no focal CN deficits from tasks pt able to complete.  Pt did not consistently follow directions due to his mental disability.  Pt coughing on secretions when SLP and NT repositioned him and note he had an episode of emesis this am.  His positioning is also poor likely impacting esophageal clearance.  Pt did not seal lips adequately on spoon - anterior loss on left labia noted due to positioning and weakness.  Did not test palatal elevation as did not desire to elicit gag with pt's nausea/vomiting issue and pt nonverbal.    Excessive oral holding noted with small ice chip and tsp water.  Delayed swallow with SLP orally suctioning melted ice x1.  Cough immediate post=swallow noted with small amount of thin via cup.  At this time, do not recommend po intake due to aspiration risk.  SLP to follow for po readiness.  Note pt on ground diet prior to admission per chart.     Aspiration Risk  Severe    Diet Recommendation NPO   Medication Administration: Via alternative means    Other  Recommendations Oral Care Recommendations: Oral care QID   Follow Up Recommendations       Frequency and Duration min 2x/week  2 weeks   Pertinent Vitals/Pain Low grade fever, congested     Swallow Study Prior Functional Status   see hhx     General Date of Onset:  06/27/15 Other Pertinent Information: 64 yo male with h/o CP, intellectual disability, coffee ground emesis 9/13 - required intubation.  Pt extubated 06/26/15, swallow evaluation ordered.  Pt found to have right mid and lower lobe infiltrate.  EGD showing HH and no active bleed 9/13.    Pt resides at group home and is on a regular/chopped diet prior to admission.  He is a ward of the state. Type of Study: Bedside swallow evaluation Diet Prior to this Study: NPO Temperature Spikes Noted: Yes (low grade) Respiratory Status: Supplemental O2 delivered via  (comment) History of Recent Intubation: Yes Length of Intubations (days): 7 days Date extubated: 06/26/15 Oral Cavity - Dentition: Adequate natural dentition/normal for age Self-Feeding Abilities: Total assist Patient Positioning: Postural control interferes with function (pt's CP and his kyphosis negatively impacts swallowing ability) Baseline Vocal Quality: Not observed;Aphonic Volitional Cough: Cognitively unable to elicit Volitional Swallow: Unable to elicit    Oral/Motor/Sensory Function Overall Oral Motor/Sensory Function:  (generalized weakness, no focal deficits from ome able to conduct, pt leaning left- decr labial seal on left with po attempt) Velum:  (DNT, pt nonverbal and did not want to elicit gag due to pt's emesis episodes)   Ice Chips Ice chips: Impaired Presentation: Spoon Oral Phase Impairments: Reduced labial seal;Reduced lingual movement/coordination;Impaired anterior to posterior transit;Poor awareness of bolus Oral Phase Functional Implications: Prolonged oral transit;Oral holding Pharyngeal Phase Impairments: Suspected delayed Swallow   Thin Liquid Thin Liquid: Impaired Presentation: Cup;Spoon Oral Phase Impairments: Reduced lingual movement/coordination;Impaired anterior to posterior transit;Reduced labial seal;Poor awareness of bolus Oral Phase Functional Implications: Prolonged oral transit;Oral holding Pharyngeal  Phase Impairments: Cough - Immediate    Nectar Thick Nectar Thick Liquid: Not tested   Honey Thick Honey Thick Liquid: Not tested   Puree Puree: Not tested   Solid   GO    Solid: Not tested       Donavan Burnet, MS [facility] SLP [phone]

## 2015-06-27 NOTE — Progress Notes (Signed)
[facility] ADULT ICU REPLACEMENT PROTOCOL FOR AM LAB REPLACEMENT ONLY  The patient does apply for the [facility] Adult ICU Electrolyte Replacment Protocol based on the criteria listed below:   1. Is GFR >/= 40 ml/min? Yes.    Patient's GFR today is >60 2. Is urine output >/= 0.5 ml/kg/hr for the last 6 hours? Yes.   Patient's UOP is 0.8 ml/kg/hr 3. Is BUN < 60 mg/dL? Yes.    Patient's BUN today is 15 4. Abnormal electrolyte(s):K3.6 5. Ordered repletion with: per protocol 6. If a panic level lab has been reported, has the CCM MD in charge been notified? Yes.  .   Physician:  Chesley Noon  Melrose Nakayama 06/27/2015 5:28 AM

## 2015-06-27 NOTE — Care Management Note (Signed)
Case Management Note  Patient Details  Name: DREUX MCGROARTY MRN: [id number] Date of Birth: [birth year]  Subjective/Objective:          Extubated 9/19 and tolerating well. Pending SLP evaluation. Concerned he will fail. Plan for small bore feeding tube if fails.          Action/Plan: alf when stable   Expected Discharge Date:   (UNKNOWN)               Expected Discharge Plan:  Home/Self Care  In-House Referral:     Discharge planning Services  CM Consult  Post Acute Care Choice:  NA Choice offered to:  NA  DME Arranged:    DME Agency:     HH Arranged:    HH Agency:     Status of Service:  In process, will continue to follow  Medicare Important Message Given:    Date Medicare IM Given:    Medicare IM give by:    Date Additional Medicare IM Given:    Additional Medicare Important Message give by:     If discussed at Long Length of Stay Meetings, dates discussed:  [id number]  Additional Comments:  Golda Acre, RN 06/27/2015, 10:42 AM

## 2015-06-27 NOTE — Progress Notes (Signed)
Pharmacy - Renal Abx Adjustment  Assessment: 64 yo M on Primaxin for ESBL Ecoli in the sputum. - CXray 9/13: progressive opacity RML, increased LB opacity - LA: 2.17 > 1.4 (9/13) - Tm 98.1, WBC improved to WNL, SCr improved to 0.96, CrCl ~ 66 CG (~ 78N)  9/13 >> Unasyn >> 9/16 9/16 >> Primaxin >>  9/14 sputum: moderate Klebsiella Pneumoniae ESBL (sens: Primaxin, Levo) MRSA PCR: neg  Plan: Increase Primaxin to IV  q8h based on improved renal function.  Charolette Child PharmD Candidate 1:02 PM 06/27/2015

## 2015-06-27 NOTE — Progress Notes (Signed)
PULMONARY / CRITICAL CARE MEDICINE   Name: Colin Bradley MRN: [id number] DOB: [birth year]    ADMISSION DATE:  06/20/2015   REFERRING MD :  EDP  CHIEF COMPLAINT:  GIB  INITIAL PRESENTATION: 64 year old male with known cerebral palsy and intellectual disability is a ward of the state. Presented with coffee-ground emesis on 9/13. Intubated for worsening hypoxic respiratory failure and evidence of aspiration pneumonia on chest x-ray imaging post-intubation.  STUDIES:  Portable CXR 9/13 -  right sided mid and lower lung opacification. Renal US 9/13 - small right renal cyst. Cholelithiasis noted. No hydronephrosis.  SIGNIFICANT EVENTS: 9/13  Intubated & admitted Upper GIB 9/13  EGD w/ hiatal hernia but no source of active bleeding 9/14  hyperchloremic acidosis  9/15  tachycardic, lopressor restarted. Continued on rx for pneumonia  9/16  WBC trending down, hgb stable, still w/ NAG metabolic acidosis.  9/18  hgb down w/o active bleeding. Patient more awake. 9/19  Extubated    SUBJECTIVE:  RN reports pt episode of emesis this am, rx'd with zofran and none since.  Pending SLP evaluation.  Concerned he may not be able to take oral medications.    VITAL SIGNS: Temp:  [98.6 F (37 C)-99.3 F (37.4 C)] 99 F (37.2 C) (09/20 0800) Pulse Rate:  [58-92] 90 (09/20 0800) Resp:  [15-31] 26 (09/20 0800) BP: (98-182)/(41-77) 165/62 mmHg (09/20 0800) SpO2:  [91 %-100 %] 98 % (09/20 0830) FiO2 (%):  [30 %] 30 % (09/19 1100) Weight:  [135 lb 5.8 oz (61.4 kg)] 135 lb 5.8 oz (61.4 kg) (09/20 0500)   HEMODYNAMICS:     VENTILATOR SETTINGS: Vent Mode:  [-]  FiO2 (%):  [30 %] 30 %   INTAKE / OUTPUT:  Intake/Output Summary (Last 24 hours) at 06/27/15 1006 Last data filed at 06/27/15 0900  Gross per 24 hour  Intake 1689.38 ml  Output    905 ml  Net 784.38 ml    PHYSICAL EXAMINATION: General : adult male in NAD, awake, follows commands HENT: NCAT ETT in place PULM:  Even/non-labored,  lungs bilaterally with soft rhonchi CV: RRR, no mgr GI: BS+, soft, nontender MSK: no obvious deformities Neuro: Awake, follows simple commands, smiles but no speech, ? If non-verbal at baseline   LABS:  CBC  Recent Labs Lab 06/26/15 0414 06/26/15 1205 06/27/15 0404  WBC 5.2 4.4 5.3  HGB 7.1* 7.6* 8.3*  HCT 21.0* 23.1* 24.7*  PLT 178 183 209   Coag's No results for input(s): APTT, INR in the last 168 hours.   BMET  Recent Labs Lab 06/25/15 0407 06/26/15 0414 06/27/15 0404  NA 143 143 142  143  K 3.1* 3.5 3.6  3.6  CL 117* 116* 112*  113*  CO2 19* 18* 19*  19*  BUN CREATININE 1.07 1.04 0.96  0.96  GLUCOSE 93 102* 93  94   Electrolytes  Recent Labs Lab 06/25/15 0407 06/26/15 0414 06/27/15 0404  CALCIUM 8.1* 8.5* 8.5*  8.4*  MG 2.0 1.9 1.9  PHOS 5.3* 4.5 4.2   Sepsis Markers  Recent Labs Lab 06/20/15 1515  LATICACIDVEN 1.4   ABG No results for input(s): PHART, PCO2ART, PO2ART in the last 168 hours.   Liver Enzymes  Recent Labs Lab 06/25/15 0407 06/26/15 0414 06/27/15 0404  ALBUMIN 2.2* 2.2* 2.5*   Cardiac Enzymes  Recent Labs Lab 06/20/15 1830  TROPONINI <0.03   Glucose  Recent Labs Lab 06/26/15 1227 06/26/15 1600 06/26/15  2009 06/27/15 0038 06/27/15 0358 06/27/15 0819  GLUCAP 130* 100* 88 81 80 81    Imaging No results found.   ASSESSMENT / PLAN:  PULMONARY OETT 9/14>> 9/19 A: Acute Hypoxic Respiratory Failure > resolved HCAP - ESBL Klebsiella Improving Resp Secretions P:   Pulmonary hygiene as able - mobilize, turning SLP evaluation pending O2 as needed to maintain saturations > 90% Intermittent CXR  CARDIOVASCULAR A: H/O RBBB Sinus Tachycardia  > resolved P:  Monitoring on telemetry Convert metoprolol to IV while NPO Clonidine to continue, may need to change to patch pending SLP eval  RENAL GU A:  Acute Renal Failure - Resolved H/O BPH Non-anion gap acidosis > due to volume  resuscitation P:   Monitor BMET and UOP Replace electrolytes as needed Flomax   GASTROINTESTINAL A:   Upper GIB - EGD with erosions GEJ, likely prior source? Mallory weiss?, no active bleeding; doesn't seem to be bleeding 9/19 but need to follow H/H closely P:   Monitoring Hgb daily on CBC Cont PPI per GI-Protonix IV twice a day; GI has signed off  SLP evaluation pending  If fails SLP eval 9/20, place small bore NGT  HEMATOLOGIC A:   Anemia - secondary to Upper GIB. No further signs of bleeding but Hgb trending down Leukocytosis - resolved P:  Trend Hgb daily w/ CBC  Transfuse for Hgb < 7gm/dL SCDs  INFECTIOUS A:   HCAP - ESBL Klebsiella P:   Monitoring for fever  Resp Ctx 9/14>>ESBL Klebsiella  Primaxin 9/16 >> Unasyn 9/13>>9/16  ENDOCRINE A:   Hypoglycemia - Resolved P:   CBG every 4 hours  NEUROLOGIC A:   H/O Cerebral Palsy H/O Intellectual Disability - ? If non-verbal at baseline  H/O Seizure Disorder P:   Continuing Vimpat Baclofen    FAMILY  - Updates: None at bedside, ward of state.   - Inter-disciplinary family meet or Palliative Care meeting due by:  9/20  TODAY'S SUMMARY:  Extubated 9/19 and tolerating well.  Pending SLP evaluation.  Concerned he will fail.  Plan for small bore feeding tube if fails.  Continue to monitor Hbg closely.     Canary Brim, NP-C [facility] Pgr: [phone] or if no answer [phone] 06/27/2015, 10:23 AM

## 2015-06-28 DIAGNOSIS — K2971 Gastritis, unspecified, with bleeding: Secondary | ICD-10-CM

## 2015-06-28 DIAGNOSIS — R1314 Dysphagia, pharyngoesophageal phase: Secondary | ICD-10-CM | POA: Clinically undetermined

## 2015-06-28 DIAGNOSIS — D62 Acute posthemorrhagic anemia: Secondary | ICD-10-CM | POA: Insufficient documentation

## 2015-06-28 LAB — RENAL FUNCTION PANEL
ANION GAP: 7 (ref 5–15)
Albumin: 2.3 g/dL — ABNORMAL LOW (ref 3.5–5.0)
BUN: 15 mg/dL (ref 6–20)
CHLORIDE: 112 mmol/L — AB (ref 101–111)
CO2: 20 mmol/L — AB (ref 22–32)
CREATININE: 0.96 mg/dL (ref 0.61–1.24)
Calcium: 8.2 mg/dL — ABNORMAL LOW (ref 8.9–10.3)
GFR calc non Af Amer: >60 mL/min (ref >60)
GLUCOSE: 105 mg/dL — AB (ref 65–99)
Phosphorus: 4.1 mg/dL (ref 2.5–4.6)
Potassium: 3.3 mmol/L — ABNORMAL LOW (ref 3.5–5.1)
SODIUM: 139 mmol/L (ref 135–145)

## 2015-06-28 LAB — GLUCOSE, CAPILLARY
GLUCOSE-CAPILLARY: 107 mg/dL — AB (ref 65–99)
GLUCOSE-CAPILLARY: 117 mg/dL — AB (ref 65–99)
GLUCOSE-CAPILLARY: 87 mg/dL (ref 65–99)
GLUCOSE-CAPILLARY: 93 mg/dL (ref 65–99)
GLUCOSE-CAPILLARY: 99 mg/dL (ref 65–99)
Glucose-Capillary: 105 mg/dL — ABNORMAL HIGH (ref 65–99)
Glucose-Capillary: 97 mg/dL (ref 65–99)

## 2015-06-28 LAB — MAGNESIUM: MAGNESIUM: 1.7 mg/dL (ref 1.7–2.4)

## 2015-06-28 MED ORDER — SODIUM BICARBONATE 650 MG PO TABS
650.0000 mg | ORAL_TABLET | Freq: Once | ORAL | Status: AC
  Administered 2015-06-28: 650 mg via ORAL
  Filled 2015-06-28: qty 1

## 2015-06-28 MED ORDER — POTASSIUM CHLORIDE 20 MEQ/15ML (10%) PO SOLN
40.0000 meq | ORAL | Status: DC
  Administered 2015-06-28: 40 meq
  Filled 2015-06-28: qty 30

## 2015-06-28 MED ORDER — PANCRELIPASE (LIP-PROT-AMYL) 12000-38000 UNITS PO CPEP
24000.0000 [IU] | ORAL_CAPSULE | Freq: Once | ORAL | Status: AC
  Administered 2015-06-28: 24000 [IU] via ORAL
  Filled 2015-06-28 (×2): qty 2

## 2015-06-28 MED ORDER — POTASSIUM CHLORIDE 10 MEQ/100ML IV SOLN
10.0000 meq | INTRAVENOUS | Status: AC
  Administered 2015-06-28 (×4): 10 meq via INTRAVENOUS
  Filled 2015-06-28 (×4): qty 100

## 2015-06-28 MED ORDER — POTASSIUM CHLORIDE 20 MEQ/15ML (10%) PO SOLN
40.0000 meq | Freq: Once | ORAL | Status: AC
  Administered 2015-06-28: 40 meq
  Filled 2015-06-28: qty 30

## 2015-06-28 MED ORDER — GUAIFENESIN 100 MG/5ML PO SOLN
5.0000 mL | Freq: Two times a day (BID) | ORAL | Status: DC
  Administered 2015-06-28 – 2015-07-11 (×20): 100 mg via ORAL
  Filled 2015-06-28 (×9): qty 10
  Filled 2015-06-28 (×4): qty 5
  Filled 2015-06-28: qty 50
  Filled 2015-06-28: qty 5
  Filled 2015-06-28 (×4): qty 10
  Filled 2015-06-28 (×2): qty 5
  Filled 2015-06-28 (×4): qty 10

## 2015-06-28 MED ORDER — MAGNESIUM SULFATE 2 GM/50ML IV SOLN
2.0000 g | Freq: Once | INTRAVENOUS | Status: AC
  Administered 2015-06-28: 2 g via INTRAVENOUS
  Filled 2015-06-28: qty 50

## 2015-06-28 MED ORDER — POTASSIUM CHLORIDE 2 MEQ/ML IV SOLN
INTRAVENOUS | Status: DC
  Administered 2015-06-28 – 2015-07-10 (×13): via INTRAVENOUS
  Filled 2015-06-28 (×20): qty 1000

## 2015-06-28 MED ORDER — METOCLOPRAMIDE HCL 5 MG/5ML PO SOLN
5.0000 mg | Freq: Three times a day (TID) | ORAL | Status: AC
  Administered 2015-06-28 (×3): 5 mg
  Filled 2015-06-28 (×3): qty 5

## 2015-06-28 MED ORDER — PANCRELIPASE (LIP-PROT-AMYL) 12000-38000 UNITS PO CPEP
24000.0000 [IU] | ORAL_CAPSULE | Freq: Once | ORAL | Status: AC
  Administered 2015-06-28: 24000 [IU] via ORAL
  Filled 2015-06-28: qty 2

## 2015-06-28 NOTE — Progress Notes (Signed)
[facility] PROGRESS NOTE  TAJAE RYBICKI ZOX:[id number] DOB: 09-23-1951 DOA: 06/20/2015 PCP: Julian Hy, MD  Assessment/Plan: #1 acute hypoxic vent dependent respiratory failure Likely secondary to HCAP-ESBL Klebsiella per tracheal aspirate. Patient still with lots of secretions and with emesis this morning. Patient being suctioned. Patient status post extubation. Continue empiric antibiotics of Primaxin, nebulizer treatments, oxygen. Will add mucolytics and chest PT. Follow.  #2 HCAP-ESBL Klebsiella/aspiration pneumonia Patient still with lots of secretions and ongoing emesis. Rhonchorous breath sounds and coarse breath sounds on examination. Continue oxygen, nebulizers, IV Primaxin. Will add mucolytics and chest PT. Follow.  #3 upper GI bleed Status post upper endoscopy with erosions at the GE junction may be the likely sulci but no active bleeding noted. Mallory-Weiss tear might be a consideration. No further bleeding noted. Hemoglobin has remained stable as of yesterday. Labs pending. Continue PPI. GI has signed off.  #4 dysphagia Patient failed swallow evaluation. Patient currently has a small bore NG tube in place. Speech therapy following. Patient is reassessed in a few days and still has failed swallowing may likely need a PEG tube.  #5 acute blood loss anemia Secondary to problem #3. Patient with no further signs of bleeding. Patient status post upper endoscopy with erosions seen at the GE junction. Labs pending. Transfusion threshold hemoglobin less than 7. Follow.  #6 sinus tachycardia Resolved. Continue Lopressor. Continue clonidine.  #7 acute renal failure Resolved.  #8 hypokalemia Replete. Keep magnesium greater than 2.  #9 cerebral palsy/history of intellectual disability stable. Patient is nonverbal.  #10 history of seizure disorder Stable. Continue Vimpat.  #11 prophylaxis PPI for GI prophylaxis. SCDs for DVT prophylaxis.     Code Status:  Full Family Communication: Patient is a ward of the state. No family at bedside. Patient updated. Disposition Plan: Remain the step down unit.   Consultants:  PCCM admit 06/20/2015 Dr Jamison Neighbor  Gastroenterology: Dr. Adela Lank 06/20/2015  Procedures:  Upper endoscopy 06/20/2015-per Dr. Lynnell Dike changes at the GE junction. Hiatial hernia.?? Mallory-Weiss tear  Chest x-ray 06/22/2015  Abdominal films 06/20/2015, 06/23/2015, 06/27/2015  Renal ultrasound 06/20/2015 SIGNIFICANT EVENTS: 9/13 Intubated & admitted Upper GIB 9/13 EGD w/ hiatal hernia but no source of active bleeding 9/14 hyperchloremic acidosis  9/15 tachycardic, lopressor restarted. Continued on rx for pneumonia  9/16 WBC trending down, hgb stable, still w/ NAG metabolic acidosis.  9/18 hgb down w/o active bleeding. Patient more awake. 9/19 Extubated    Antibiotics: Primaxin 9/16 >> Unasyn 9/13>>9/16   HPI/Subjective: Patient sitting up at bedside actively vomiting bilious emesis. Patient with bowel movement this morning per nursing. Patient nonverbal.  Objective: Filed Vitals:   06/28/15 0420  BP:   Pulse:   Temp: 99.4 F (37.4 C)  Resp:     Intake/Output Summary (Last 24 hours) at 06/28/15 0837 Last data filed at 06/28/15 0600  Gross per 24 hour  Intake   1555 ml  Output     90 ml  Net   1465 ml   Filed Weights   06/26/15 0500 06/27/15 0500 06/28/15 0420  Weight: 61.7 kg (136 lb 0.4 oz) 61.4 kg (135 lb 5.8 oz) 63.6 kg (140 lb 3.4 oz)    Exam:   General:  Sitting up in bed. Actively vomitting.  Cardiovascular: Regular rate rhythm no murmurs rubs or gallops.  Respiratory: Rhonchorous coarse diffuse breath sounds anterior lung fields. No wheezing.  Abdomen: Soft, nontender, nondistended, positive bowel sounds.  Musculoskeletal: No clubbing cyanosis or edema.   Data Reviewed: Basic Metabolic Panel:  Recent Labs Lab 06/24/15 0506 06/25/15 0407 06/26/15 0414  06/27/15 0404 06/28/15 0345  NA 143 143 143 142  143 139  K 3.2* 3.1* 3.5 3.6  3.6 3.3*  CL 117* 117* 116* 112*  113* 112*  CO2 20* 19* 18* 19*  19* 20*  GLUCOSE 89 93 102* 93  94 105*  BUN CREATININE 1.19 1.07 1.04 0.96  0.96 0.96  CALCIUM 8.3* 8.1* 8.5* 8.5*  8.4* 8.2*  MG 1.6* 2.0 1.9 1.9 1.7  PHOS 5.4* 5.3* 4.5 4.2 4.1   Liver Function Tests:  Recent Labs Lab 06/24/15 0506 06/25/15 0407 06/26/15 0414 06/27/15 0404 06/28/15 0345  ALBUMIN 2.3* 2.2* 2.2* 2.5* 2.3*   No results for input(s): LIPASE, AMYLASE in the last 168 hours. No results for input(s): AMMONIA in the last 168 hours. CBC:  Recent Labs Lab 06/25/15 0407  06/25/15 1427 06/25/15 1714 06/26/15 0414 06/26/15 1205 06/27/15 0404  WBC 4.9  --   --  4.5 5.2 4.4 5.3  NEUTROABS 2.7  --   --  3.0 2.9 2.3 3.2  HGB 7.5*  < > 8.1* 8.5* 7.1* 7.6* 8.3*  HCT 21.8*  < > 23.8* 25.2* 21.0* 23.1* 24.7*  MCV 96.9  --   --  97.3 98.1 97.5 97.6  PLT 137*  --   --  178 178 183 209  < > = values in this interval not displayed. Cardiac Enzymes: No results for input(s): CKTOTAL, CKMB, CKMBINDEX, TROPONINI in the last 168 hours. BNP (last 3 results)  Recent Labs  12/03/14 1650  BNP 133.7*    ProBNP (last 3 results) No results for input(s): PROBNP in the last 8760 hours.  CBG:  Recent Labs Lab 06/27/15 1206 06/27/15 1602 06/27/15 2032 06/27/15 2345 06/28/15 0447  GLUCAP 75 93 73 105* 87    Recent Results (from the past 240 hour(s))  MRSA PCR Screening     Status: None   Collection Time: 06/20/15  1:00 PM  Result Value Ref Range Status   MRSA by PCR NEGATIVE NEGATIVE Final    Comment:        The GeneXpert MRSA Assay (FDA approved for NASAL specimens only), is one component of a comprehensive MRSA colonization surveillance program. It is not intended to diagnose MRSA infection nor to guide or monitor treatment for MRSA infections.   Culture, respiratory  (NON-Expectorated)     Status: None   Collection Time: 06/21/15  8:50 AM  Result Value Ref Range Status   Specimen Description TRACHEAL ASPIRATE  Final   Special Requests Normal  Final   Gram Stain   Final    ABUNDANT WBC PRESENT, PREDOMINANTLY PMN RARE SQUAMOUS EPITHELIAL CELLS PRESENT RARE YEAST RARE GRAM VARIABLE ROD Performed at [facility]    Culture   Final    MODERATE KLEBSIELLA PNEUMONIAE 16 Note: Confirmed Extended Spectrum Beta-Lactamase Producer (ESBL) CRITICAL RESULT CALLED TO, READ BACK BY AND VERIFIED WITH: DENISE ALDRIDGE 9 17 @ 0955 BY PARDA Performed at [facility]    Report Status 06/24/2015 FINAL  Final   Organism ID, Bacteria KLEBSIELLA PNEUMONIAE  Final      Susceptibility   Klebsiella pneumoniae - MIC*    AMPICILLIN >=32 RESISTANT Resistant     AMPICILLIN/SULBACTAM >=32 RESISTANT Resistant     CEFAZOLIN >=64 RESISTANT Resistant     CEFEPIME RESISTANT      CEFTAZIDIME 16 RESISTANT Resistant     CEFTRIAXONE >=64 RESISTANT  Resistant     CIPROFLOXACIN 2 INTERMEDIATE Intermediate     IMIPENEM <=0.25 SENSITIVE Sensitive     PIP/TAZO 64 INTERMEDIATE Intermediate     TRIMETH/SULFA >=320 RESISTANT Resistant     LEVOFLOXACIN 1 SENSITIVE Sensitive     GENTAMICIN <=1 SENSITIVE Sensitive     TOBRAMYCIN >=16 RESISTANT Resistant     * MODERATE KLEBSIELLA PNEUMONIAE     Studies: Dg Abd Portable 1v  06/27/2015   CLINICAL DATA:  Feeding tube placement.  EXAM: PORTABLE ABDOMEN - 1 VIEW  COMPARISON:  06/23/2015.  FINDINGS: Feeding tube tip in the proximal stomach. Normal bowel gas pattern. Moderate levoconvex thoracolumbar rotary scoliosis and degenerative changes.  IMPRESSION: Feeding tube tip in the proximal stomach.   Electronically Signed   By: Beckie Salts M.D.   On: 06/27/2015 14:28    Scheduled Meds: . albuterol  2.5 mg Nebulization Q6H  . antiseptic oral rinse  7 mL Mouth Rinse q12n4p  . baclofen  5 mg Oral TID  . chlorhexidine  15 mL Mouth  Rinse BID  . cloNIDine  0.2 mg Per Tube BID  . cycloSPORINE  1 drop Both Eyes BID  . escitalopram  20 mg Per Tube Daily  . imipenem-cilastatin  500 mg Intravenous Q8H  . lacosamide (VIMPAT) IV  150 mg Intravenous Q12H  . magnesium sulfate 1 - 4 g bolus IVPB  2 g Intravenous Once  . metoprolol  5 mg Intravenous 4 times per day  . pantoprazole (PROTONIX) IV  40 mg Intravenous Q12H  . potassium chloride  10 mEq Intravenous Q1 Hr x 4  . QUEtiapine  25 mg Per Tube QHS  . tamsulosin  0.4 mg Oral Daily   Continuous Infusions: . dextrose 5 % and 0.9% NaCl 1,000 mL with potassium chloride 40 mEq infusion      Principal Problem:   Acute respiratory failure with hypoxia Active Problems:   HCAP (healthcare-associated pneumonia)   Depression   Mental retardation   Infantile cerebral palsy   Anemia   Seizure disorder   Esophagitis   Acute upper GI bleed   GI bleed   Ventilator dependent   Acute renal failure   Nausea & vomiting   Respiratory failure   Pneumonitis   Dysphagia, pharyngoesophageal phase    Time spent: 40 MINS    [facility] MD [facility] Pager [phone]. If 7PM-7AM, please contact night-coverage at [email or web address], password [facility] 06/28/2015, 8:37 AM  LOS: 8 days

## 2015-06-28 NOTE — Progress Notes (Addendum)
CSW continues to follow to assist with d/c planning. Updated clinicals sent to Porscha ( NSG ) at [facility] for review. Notes indicate that a Peg tube may be required at d/c. Porscha will check to see if ALF can manage this care and contact CSW with decision.  Cori Razor LCSW [phone]

## 2015-06-28 NOTE — Progress Notes (Signed)
   06/28/15 1400  Clinical Encounter Type  Visited With Patient;Health care provider  Visit Type Initial;Spiritual support  Referral From Chaplain  Spiritual Encounters  Spiritual Needs Other (Comment);Prayer Chief Strategy Officer of presence)  CH referred to pt; briefed on pt by RN; pt has limited cognition and responsiveness; CH announced presence in room and offered prayer and sat quietly with pt.  2:26 PM Erline Levine

## 2015-06-29 LAB — RENAL FUNCTION PANEL
Albumin: 2.3 g/dL — ABNORMAL LOW (ref 3.5–5.0)
Anion gap: 4 — ABNORMAL LOW (ref 5–15)
BUN: 15 mg/dL (ref 6–20)
CALCIUM: 8.2 mg/dL — AB (ref 8.9–10.3)
CHLORIDE: 117 mmol/L — AB (ref 101–111)
CO2: 21 mmol/L — AB (ref 22–32)
CREATININE: 0.95 mg/dL (ref 0.61–1.24)
GFR calc non Af Amer: >60 mL/min (ref >60)
GLUCOSE: 96 mg/dL (ref 65–99)
Phosphorus: 3.7 mg/dL (ref 2.5–4.6)
Potassium: 4.1 mmol/L (ref 3.5–5.1)
SODIUM: 142 mmol/L (ref 135–145)

## 2015-06-29 LAB — GLUCOSE, CAPILLARY
GLUCOSE-CAPILLARY: 80 mg/dL (ref 65–99)
GLUCOSE-CAPILLARY: 102 mg/dL — AB (ref 65–99)
Glucose-Capillary: 85 mg/dL (ref 65–99)
Glucose-Capillary: 80 mg/dL (ref 65–99)

## 2015-06-29 LAB — CBC
HCT: 25.9 % — ABNORMAL LOW (ref 39.0–52.0)
Hemoglobin: 8.4 g/dL — ABNORMAL LOW (ref 13.0–17.0)
MCH: 32.4 pg (ref 26.0–34.0)
MCHC: 32.4 g/dL (ref 30.0–36.0)
MCV: 100 fL (ref 78.0–100.0)
PLATELETS: 262 10*3/uL (ref 150–400)
RBC: 2.59 MIL/uL — AB (ref 4.22–5.81)
RDW: 14.4 % (ref 11.5–15.5)
WBC: 7.8 10*3/uL (ref 4.0–10.5)

## 2015-06-29 LAB — MAGNESIUM: MAGNESIUM: 2 mg/dL (ref 1.7–2.4)

## 2015-06-29 MED ORDER — FREE WATER
100.0000 mL | Freq: Three times a day (TID) | Status: DC
  Administered 2015-06-29 – 2015-07-07 (×11): 100 mL

## 2015-06-29 MED ORDER — ALBUTEROL SULFATE (2.5 MG/3ML) 0.083% IN NEBU
2.5000 mg | INHALATION_SOLUTION | Freq: Three times a day (TID) | RESPIRATORY_TRACT | Status: DC
  Administered 2015-06-29: 2.5 mg via RESPIRATORY_TRACT

## 2015-06-29 MED ORDER — JEVITY 1.2 CAL PO LIQD
1000.0000 mL | ORAL | Status: DC
  Administered 2015-06-29: 1000 mL
  Filled 2015-06-29 (×7): qty 1000

## 2015-06-29 NOTE — Progress Notes (Signed)
[facility] PROGRESS NOTE  Colin Bradley ZOX:[id number] DOB: 01-02-1951 DOA: 06/20/2015 PCP: Julian Hy, MD  Assessment/Plan: #1 acute hypoxic vent dependent respiratory failure Likely secondary to HCAP-ESBL Klebsiella per tracheal aspirate. Patient with some improvement in secretions. No emesis this morning. Continue suctioning. Patient status post extubation. Continue empiric antibiotics of Primaxin, nebulizer treatments, oxygen, mucolytics and chest PT. Follow.  #2 HCAP-ESBL Klebsiella/aspiration pneumonia Patient still with lots of secretions and ongoing emesis yesterday that has since improved. Rhonchi and coarse breath sounds have improved on examination. Continue oxygen, nebulizers, IV Primaxin, mucolytics and chest PT. Follow.  #3 upper GI bleed Status post upper endoscopy with erosions at the GE junction may be the likely source, but no active bleeding noted. Mallory-Weiss tear might be a consideration. No further bleeding noted. Hemoglobin has remained stable. Continue PPI. GI has signed off.  #4 dysphagia Patient failed swallow evaluation. Patient currently has a small bore NG tube in place. Speech therapy following. Patient is to be reassessed in a few days and if still has failed swallowing may likely need a PEG tube.  #5 acute blood loss anemia Secondary to problem #3. Patient with no further signs of bleeding. Patient status post upper endoscopy with erosions seen at the GE junction. Hemoglobin is stable at 8.4.Transfusion threshold hemoglobin less than 7. Follow.  #6 sinus tachycardia Resolved. Continue Lopressor. Continue clonidine.  #7 acute renal failure Resolved.  #8 hypokalemia Repleted. Keep magnesium greater than 2.  #9 cerebral palsy/history of intellectual disability stable. Patient is verbal this morning.  #10 history of seizure disorder Stable. Continue Vimpat.  #11 prophylaxis PPI for GI prophylaxis. SCDs for DVT  prophylaxis.     Code Status: Full Family Communication: Patient is a ward of the state. No family at bedside. Patient updated. Disposition Plan: Transfer to telemetry.   Consultants:  PCCM admit 06/20/2015 Dr Jamison Neighbor  Gastroenterology: Dr. Adela Lank 06/20/2015  Procedures:  Upper endoscopy 06/20/2015-per Dr. Lynnell Dike changes at the GE junction. Hiatial hernia.?? Mallory-Weiss tear  Chest x-ray 06/22/2015  Abdominal films 06/20/2015, 06/23/2015, 06/27/2015  Renal ultrasound 06/20/2015 SIGNIFICANT EVENTS: 9/13 Intubated & admitted Upper GIB 9/13 EGD w/ hiatal hernia but no source of active bleeding 9/14 hyperchloremic acidosis  9/15 tachycardic, lopressor restarted. Continued on rx for pneumonia  9/16 WBC trending down, hgb stable, still w/ NAG metabolic acidosis.  9/18 hgb down w/o active bleeding. Patient more awake. 9/19 Extubated    Antibiotics: Primaxin 9/16 >> Unasyn 9/13>>9/16   HPI/Subjective: Patient laying in bed. Patient states he's feeling better. Patient denies any shortness of breath. Patient denies chest pain. Patient denies any emesis.  Objective: Filed Vitals:   06/29/15 0705  BP:   Pulse:   Temp: 98.4 F (36.9 C)  Resp:     Intake/Output Summary (Last 24 hours) at 06/29/15 0837 Last data filed at 06/28/15 2000  Gross per 24 hour  Intake 1307.5 ml  Output      0 ml  Net 1307.5 ml   Filed Weights   06/26/15 0500 06/27/15 0500 06/28/15 0420  Weight: 61.7 kg (136 lb 0.4 oz) 61.4 kg (135 lb 5.8 oz) 63.6 kg (140 lb 3.4 oz)    Exam:   General:  Laying in bed. NAD  Cardiovascular: Regular rate rhythm no murmurs rubs or gallops.  Respiratory: CTAB anterior lung fields. No wheezing.  Abdomen: Soft, nontender, nondistended, positive bowel sounds.  Musculoskeletal: No clubbing cyanosis or edema.   Data Reviewed: Basic Metabolic Panel:  Recent Labs Lab 06/25/15 0407  06/26/15 0414 06/27/15 0404 06/28/15 0345  06/29/15 0407  NA 143 143 142  143 139 142  K 3.1* 3.5 3.6  3.6 3.3* 4.1  CL 117* 116* 112*  113* 112* 117*  CO2 19* 18* 19*  19* 20* 21*  GLUCOSE 93 102* 93  94 105* 96  BUN CREATININE 1.07 1.04 0.96  0.96 0.96 0.95  CALCIUM 8.1* 8.5* 8.5*  8.4* 8.2* 8.2*  MG 2.0 1.9 1.9 1.7 2.0  PHOS 5.3* 4.5 4.2 4.1 3.7   Liver Function Tests:  Recent Labs Lab 06/25/15 0407 06/26/15 0414 06/27/15 0404 06/28/15 0345 06/29/15 0407  ALBUMIN 2.2* 2.2* 2.5* 2.3* 2.3*   No results for input(s): LIPASE, AMYLASE in the last 168 hours. No results for input(s): AMMONIA in the last 168 hours. CBC:  Recent Labs Lab 06/25/15 0407  06/25/15 1714 06/26/15 0414 06/26/15 1205 06/27/15 0404 06/29/15 0407  WBC 4.9  --  4.5 5.2 4.4 5.3 7.8  NEUTROABS 2.7  --  3.0 2.9 2.3 3.2  --   HGB 7.5*  < > 8.5* 7.1* 7.6* 8.3* 8.4*  HCT 21.8*  < > 25.2* 21.0* 23.1* 24.7* 25.9*  MCV 96.9  --  97.3 98.1 97.5 97.6 100.0  PLT 137*  --  178 178 183 209 262  < > = values in this interval not displayed. Cardiac Enzymes: No results for input(s): CKTOTAL, CKMB, CKMBINDEX, TROPONINI in the last 168 hours. BNP (last 3 results)  Recent Labs  12/03/14 1650  BNP 133.7*    ProBNP (last 3 results) No results for input(s): PROBNP in the last 8760 hours.  CBG:  Recent Labs Lab 06/28/15 1612 06/28/15 1940 06/28/15 2337 06/29/15 0322 06/29/15 0749  GLUCAP 97 93 107* 85 80    Recent Results (from the past 240 hour(s))  MRSA PCR Screening     Status: None   Collection Time: 06/20/15  1:00 PM  Result Value Ref Range Status   MRSA by PCR NEGATIVE NEGATIVE Final    Comment:        The GeneXpert MRSA Assay (FDA approved for NASAL specimens only), is one component of a comprehensive MRSA colonization surveillance program. It is not intended to diagnose MRSA infection nor to guide or monitor treatment for MRSA infections.   Culture, respiratory (NON-Expectorated)     Status: None    Collection Time: 06/21/15  8:50 AM  Result Value Ref Range Status   Specimen Description TRACHEAL ASPIRATE  Final   Special Requests Normal  Final   Gram Stain   Final    ABUNDANT WBC PRESENT, PREDOMINANTLY PMN RARE SQUAMOUS EPITHELIAL CELLS PRESENT RARE YEAST RARE GRAM VARIABLE ROD Performed at [facility]    Culture   Final    MODERATE KLEBSIELLA PNEUMONIAE 16 Note: Confirmed Extended Spectrum Beta-Lactamase Producer (ESBL) CRITICAL RESULT CALLED TO, READ BACK BY AND VERIFIED WITH: DENISE ALDRIDGE 9 17 @ 0955 BY PARDA Performed at [facility]    Report Status 06/24/2015 FINAL  Final   Organism ID, Bacteria KLEBSIELLA PNEUMONIAE  Final      Susceptibility   Klebsiella pneumoniae - MIC*    AMPICILLIN >=32 RESISTANT Resistant     AMPICILLIN/SULBACTAM >=32 RESISTANT Resistant     CEFAZOLIN >=64 RESISTANT Resistant     CEFEPIME RESISTANT      CEFTAZIDIME 16 RESISTANT Resistant     CEFTRIAXONE >=64 RESISTANT Resistant     CIPROFLOXACIN 2 INTERMEDIATE Intermediate  IMIPENEM <=0.25 SENSITIVE Sensitive     PIP/TAZO 64 INTERMEDIATE Intermediate     TRIMETH/SULFA >=320 RESISTANT Resistant     LEVOFLOXACIN 1 SENSITIVE Sensitive     GENTAMICIN <=1 SENSITIVE Sensitive     TOBRAMYCIN >=16 RESISTANT Resistant     * MODERATE KLEBSIELLA PNEUMONIAE     Studies: Dg Abd Portable 1v  06/27/2015   CLINICAL DATA:  Feeding tube placement.  EXAM: PORTABLE ABDOMEN - 1 VIEW  COMPARISON:  06/23/2015.  FINDINGS: Feeding tube tip in the proximal stomach. Normal bowel gas pattern. Moderate levoconvex thoracolumbar rotary scoliosis and degenerative changes.  IMPRESSION: Feeding tube tip in the proximal stomach.   Electronically Signed   By: Beckie Salts M.D.   On: 06/27/2015 14:28    Scheduled Meds: . albuterol  2.5 mg Nebulization TID  . antiseptic oral rinse  7 mL Mouth Rinse q12n4p  . baclofen  5 mg Oral TID  . chlorhexidine  15 mL Mouth Rinse BID  . cloNIDine  0.2 mg Per  Tube BID  . cycloSPORINE  1 drop Both Eyes BID  . escitalopram  20 mg Per Tube Daily  . guaiFENesin  5 mL Oral BID  . imipenem-cilastatin  500 mg Intravenous Q8H  . lacosamide (VIMPAT) IV  150 mg Intravenous Q12H  . metoprolol  5 mg Intravenous 4 times per day  . pantoprazole (PROTONIX) IV  40 mg Intravenous Q12H  . QUEtiapine  25 mg Per Tube QHS  . tamsulosin  0.4 mg Oral Daily   Continuous Infusions: . dextrose 5 % and 0.9% NaCl 1,000 mL with potassium chloride 40 mEq infusion 50 mL/hr at 06/28/15 1021    Principal Problem:   Acute respiratory failure with hypoxia Active Problems:   HCAP (healthcare-associated pneumonia)   Depression   Mental retardation   Infantile cerebral palsy   Anemia   Seizure disorder   Esophagitis   Acute upper GI bleed   GI bleed   Ventilator dependent   Acute renal failure   Nausea & vomiting   Respiratory failure   Pneumonitis   Dysphagia, pharyngoesophageal phase   Acute blood loss anemia    Time spent: 40 MINS    [facility] MD [facility] Pager [phone]. If 7PM-7AM, please contact night-coverage at [email or web address], password [facility] 06/29/2015, 8:37 AM  LOS: 9 days

## 2015-06-29 NOTE — Progress Notes (Signed)
NUTRITION NOTE  Pt seen for new consult for RD to initiate and manage TF. Full follow-up done yesterday (9/21).   Pt is very high refeeding risk due to no nutrition for at least 9 days. Monitor magnesium, potassium, and phosphorus daily for at least 3 days, MD to replete as needed, as pt is at risk for refeeding syndrome given prolonged period of no nutrition.  Will order Jevity 1.2 @ 15 mL/hr and advance by 10 mL Q12h to goal rate of 55 mL/hr which will provide 1584 kcal, 73 grams protein, and 1065 mL free water. Will also order 100 mL free water Q8h to add additional 300 mL water/day.   Not currently able to meet needs. SLP note from this AM recommended NPO. Medications reviewed. Labs reviewed; CBGs: 80-117 mg/dL, Cl: 161 mmol/L, Ca: 8.2 mg/dL.    Trenton Gammon, RD, LDN Inpatient Clinical Dietitian Pager # [phone] After hours/weekend pager # [phone]

## 2015-06-29 NOTE — Progress Notes (Signed)
Date:  Sept. 22, 2016 U.R. performed for needs and level of care. Will continue to follow for Case Management needs.  Marcelle Smiling, RN, BSN, [place]   [phone]

## 2015-06-29 NOTE — Progress Notes (Signed)
CSW spoke with tammy ( NSG ) at [facility] center ( ALF ) this am. ALF is able to manage PEG tube if pt requires this at d/c. Tammy requested an order for [facility] NSG [facility] ) for assistance. CSW will continue to follow to assist with d/c planning.  Cori Razor LCSW [phone]

## 2015-06-29 NOTE — Progress Notes (Signed)
Speech Language Pathology Treatment: Dysphagia  Patient Details Name: Colin Bradley MRN: [id number] DOB: 09/20/1951 Today's Date: 06/29/2015 Time: 1012-1030 SLP Time Calculation (min) (ACUTE ONLY): 18 min  Assessment / Plan / Recommendation Clinical Impression  F/u for swallowing: pt much more alert, communicative today - he demonstrates improved bolus awareness and oral control; there was a consistent cough response palpated; however, all consistencies elicited an immediate, wet cough response, concerning for aspiration.  Pt requires mod verbal cues to follow instructions.  Given improvements in last two days, encouraged that swallow will continue to improve and that PEG will not be necessary.  Will f/u next date for ongoing assessment and possible MBS.     HPI Other Pertinent Information: 64 yo male with h/o CP, intellectual disability, coffee ground emesis 9/13 - required intubation.  Pt extubated 06/26/15, swallow evaluation ordered.  Pt found to have right mid and lower lobe infiltrate.  EGD showing HH and no active bleed 9/13.    Pt resides at group home and is on a regular/chopped diet prior to admission.  He is a ward of the state.   Pertinent Vitals Pain Assessment: No/denies pain  SLP Plan  Continue with current plan of care    Recommendations Diet recommendations: NPO              Oral Care Recommendations: Oral care QID Follow up Recommendations:  (tba) Plan: Continue with current plan of care    Amanda L. Samson Frederic, [place] CCC/SLP Pager [phone]      Blenda Mounts Laurice 06/29/2015, 10:34 AM

## 2015-06-29 NOTE — Care Management Note (Signed)
Case Management Note  Patient Details  Name: Colin Bradley MRN: [id number] Date of Birth: 1950/10/16  Subjective/Objective:                    Action/Plan:   Expected Discharge Date:   (UNKNOWN)               Expected Discharge Plan:  Home/Self Care  In-House Referral:     Discharge planning Services  CM Consult  Post Acute Care Choice:  NA Choice offered to:  NA  DME Arranged:    DME Agency:     HH Arranged:    HH Agency:     Status of Service:  In process, will continue to follow  Medicare Important Message Given:    Date Medicare IM Given:    Medicare IM give by:    Date Additional Medicare IM Given:    Additional Medicare Important Message give by:     If discussed at Long Length of Stay Meetings, dates discussed:  [id number]  Additional Comments:  Golda Acre, RN 06/29/2015, 3:12 PM

## 2015-06-29 NOTE — Clinical Documentation Improvement (Signed)
Hospitalist      Please provide historical, or baseline comparative data to support your documented diagnosis of "Acute Renal Failure".  Or  If the diagnosis is not applicable to this admission, amend your documentation as appropriate.   (please document your response in the progress notes and not on the query form itself.)   Please exercise your independent, professional judgment when responding. A specific answer is not anticipated or expected.   Thank You, Jerral Ralph   RN BSN CCDS [phone] Health Information Management [place]

## 2015-06-30 ENCOUNTER — Inpatient Hospital Stay (HOSPITAL_COMMUNITY): Payer: Medicare Other

## 2015-06-30 LAB — GLUCOSE, CAPILLARY
GLUCOSE-CAPILLARY: 116 mg/dL — AB (ref 65–99)
GLUCOSE-CAPILLARY: 110 mg/dL — AB (ref 65–99)
GLUCOSE-CAPILLARY: 88 mg/dL (ref 65–99)
Glucose-Capillary: 72 mg/dL (ref 65–99)
Glucose-Capillary: 76 mg/dL (ref 65–99)
Glucose-Capillary: 78 mg/dL (ref 65–99)
Glucose-Capillary: 86 mg/dL (ref 65–99)

## 2015-06-30 LAB — RENAL FUNCTION PANEL
Albumin: 2.1 g/dL — ABNORMAL LOW (ref 3.5–5.0)
Anion gap: 5 (ref 5–15)
BUN: 12 mg/dL (ref 6–20)
CHLORIDE: 115 mmol/L — AB (ref 101–111)
CO2: 20 mmol/L — ABNORMAL LOW (ref 22–32)
Calcium: 8.1 mg/dL — ABNORMAL LOW (ref 8.9–10.3)
Creatinine, Ser: 0.76 mg/dL (ref 0.61–1.24)
GFR calc Af Amer: >60 mL/min (ref >60)
GFR calc non Af Amer: >60 mL/min (ref >60)
GLUCOSE: 127 mg/dL — AB (ref 65–99)
POTASSIUM: 4.2 mmol/L (ref 3.5–5.1)
Phosphorus: 3.5 mg/dL (ref 2.5–4.6)
Sodium: 140 mmol/L (ref 135–145)

## 2015-06-30 LAB — CBC
HEMATOCRIT: 24.9 % — AB (ref 39.0–52.0)
HEMOGLOBIN: 8.2 g/dL — AB (ref 13.0–17.0)
MCH: 32.3 pg (ref 26.0–34.0)
MCHC: 32.9 g/dL (ref 30.0–36.0)
MCV: 98 fL (ref 78.0–100.0)
Platelets: 288 10*3/uL (ref 150–400)
RBC: 2.54 MIL/uL — ABNORMAL LOW (ref 4.22–5.81)
RDW: 14 % (ref 11.5–15.5)
WBC: 7.9 10*3/uL (ref 4.0–10.5)

## 2015-06-30 LAB — MAGNESIUM: Magnesium: 1.7 mg/dL (ref 1.7–2.4)

## 2015-06-30 MED ORDER — MAGNESIUM SULFATE 50 % IJ SOLN
3.0000 g | Freq: Once | INTRAVENOUS | Status: AC
  Administered 2015-06-30: 3 g via INTRAVENOUS
  Filled 2015-06-30: qty 6

## 2015-06-30 NOTE — Progress Notes (Signed)
[facility] PROGRESS NOTE  Colin Bradley ZOX:[id number] DOB: [birth year] DOA: 06/20/2015 PCP: Julian Hy, MD  Assessment/Plan: #1 acute hypoxic vent dependent respiratory failure Likely secondary to HCAP-ESBL Klebsiella per tracheal aspirate. Patient with some improvement in secretions. No emesis this morning. Continue suctioning. Patient status post extubation. Continue empiric antibiotics of Primaxin, nebulizer treatments, oxygen, mucolytics and chest PT. Follow.  #2 HCAP-ESBL Klebsiella/aspiration pneumonia Patient still with lots of secretions and ongoing emesis yesterday that has since improved. Rhonchi and coarse breath sounds have improved on examination. Continue oxygen, nebulizers, IV Primaxin, mucolytics and chest PT. Follow.  #3 upper GI bleed Status post upper endoscopy with erosions at the GE junction may be the likely source, but no active bleeding noted. Mallory-Weiss tear might be a consideration. No further bleeding noted. Hemoglobin has remained stable. Continue PPI. GI has signed off.  #4 dysphagia Patient failed swallow evaluation. Patient currently has a small bore NG tube in place however this was accidentally removed. Standard tube to be placed back in as per speech therapy patient still with increased aspiration risk and currently nothing by mouth. Speech therapy following. Patient is to be reassessed in a few days and if still has failed swallowing may likely need a PEG tube.  #5 acute blood loss anemia Secondary to problem #3. Patient with no further signs of bleeding. Patient status post upper endoscopy with erosions seen at the GE junction. Hemoglobin is stable at 8.2.Transfusion threshold hemoglobin less than 7. Follow.  #6 sinus tachycardia Resolved. Continue Lopressor. Continue clonidine.  #7 acute renal failure On admission patient was noted to have a creatinine of 1.43 with a BUN of 40 with a GFR in the 50s. Improved with hydration and renal  function currently with a creatinine of 0.76 and a BUN of 12. Resolved.  #8 hypokalemia Repleted. Keep magnesium greater than 2.  #9 cerebral palsy/history of intellectual disability stable. Likely at baseline.  #10 history of seizure disorder Stable. Continue Vimpat.  #11 prophylaxis PPI for GI prophylaxis. SCDs for DVT prophylaxis.     Code Status: Full Family Communication: Patient is a ward of the state. No family at bedside. Patient updated. Disposition Plan: Back to group home when medically stable.   Consultants:  PCCM admit 06/20/2015 Dr Jamison Neighbor  Gastroenterology: Dr. Adela Lank 06/20/2015  Procedures:  Upper endoscopy 06/20/2015-per Dr. Lynnell Dike changes at the GE junction. Hiatial hernia.?? Mallory-Weiss tear  Chest x-ray 06/22/2015  Abdominal films 06/20/2015, 06/23/2015, 06/27/2015  Renal ultrasound 06/20/2015 SIGNIFICANT EVENTS: 9/13 Intubated & admitted Upper GIB 9/13 EGD w/ hiatal hernia but no source of active bleeding 9/14 hyperchloremic acidosis  9/15 tachycardic, lopressor restarted. Continued on rx for pneumonia  9/16 WBC trending down, hgb stable, still w/ NAG metabolic acidosis.  9/18 hgb down w/o active bleeding. Patient more awake. 9/19 Extubated    Antibiotics: Primaxin 9/16 >> Unasyn 9/13>>9/16   HPI/Subjective: Patient laying in bed. Patient denies chest pain or shortness of breath. Events overnight noted.  Objective: Filed Vitals:   06/30/15 0632  BP: 146/70  Pulse: 74  Temp: 98.8 F (37.1 C)  Resp: 22    Intake/Output Summary (Last 24 hours) at 06/30/15 1408 Last data filed at 06/30/15 0837  Gross per 24 hour  Intake   2089 ml  Output    425 ml  Net   1664 ml   Filed Weights   06/28/15 0420 06/29/15 1338 06/30/15 0632  Weight: 63.6 kg (140 lb 3.4 oz) 66.679 kg (147 lb) 64.7 kg (142 lb  10.2 oz)    Exam:   General:  Laying in bed. NAD  Cardiovascular: Regular rate rhythm no murmurs rubs or  gallops.  Respiratory: CTAB anterior lung fields. No wheezing.  Abdomen: Soft, nontender, nondistended, positive bowel sounds.  Musculoskeletal: No clubbing cyanosis or edema.   Data Reviewed: Basic Metabolic Panel:  Recent Labs Lab 06/26/15 0414 06/27/15 0404 06/28/15 0345 06/29/15 0407 06/30/15 0429  NA 143 142  143 139 142 140  K 3.5 3.6  3.6 3.3* 4.1 4.2  CL 116* 112*  113* 112* 117* 115*  CO2 18* 19*  19* 20* 21* 20*  GLUCOSE 102* 93  94 105* 96 127*  BUN CREATININE 1.04 0.96  0.96 0.96 0.95 0.76  CALCIUM 8.5* 8.5*  8.4* 8.2* 8.2* 8.1*  MG 1.9 1.9 1.7 2.0 1.7  PHOS 4.5 4.2 4.1 3.7 3.5   Liver Function Tests:  Recent Labs Lab 06/26/15 0414 06/27/15 0404 06/28/15 0345 06/29/15 0407 06/30/15 0429  ALBUMIN 2.2* 2.5* 2.3* 2.3* 2.1*   No results for input(s): LIPASE, AMYLASE in the last 168 hours. No results for input(s): AMMONIA in the last 168 hours. CBC:  Recent Labs Lab 06/25/15 0407  06/25/15 1714 06/26/15 0414 06/26/15 1205 06/27/15 0404 06/29/15 0407 06/30/15 0429  WBC 4.9  --  4.5 5.2 4.4 5.3 7.8 7.9  NEUTROABS 2.7  --  3.0 2.9 2.3 3.2  --   --   HGB 7.5*  < > 8.5* 7.1* 7.6* 8.3* 8.4* 8.2*  HCT 21.8*  < > 25.2* 21.0* 23.1* 24.7* 25.9* 24.9*  MCV 96.9  --  97.3 98.1 97.5 97.6 100.0 98.0  PLT 137*  --  178 178 183 209 262 288  < > = values in this interval not displayed. Cardiac Enzymes: No results for input(s): CKTOTAL, CKMB, CKMBINDEX, TROPONINI in the last 168 hours. BNP (last 3 results)  Recent Labs  12/03/14 1650  BNP 133.7*    ProBNP (last 3 results) No results for input(s): PROBNP in the last 8760 hours.  CBG:  Recent Labs Lab 06/29/15 2157 06/30/15 0010 06/30/15 0401 06/30/15 0740 06/30/15 1210  GLUCAP 86 78 116* 110* 88    Recent Results (from the past 240 hour(s))  Culture, respiratory (NON-Expectorated)     Status: None   Collection Time: 06/21/15  8:50 AM  Result Value Ref Range Status    Specimen Description TRACHEAL ASPIRATE  Final   Special Requests Normal  Final   Gram Stain   Final    ABUNDANT WBC PRESENT, PREDOMINANTLY PMN RARE SQUAMOUS EPITHELIAL CELLS PRESENT RARE YEAST RARE GRAM VARIABLE ROD Performed at [facility]    Culture   Final    MODERATE KLEBSIELLA PNEUMONIAE 16 Note: Confirmed Extended Spectrum Beta-Lactamase Producer (ESBL) CRITICAL RESULT CALLED TO, READ BACK BY AND VERIFIED WITH: DENISE ALDRIDGE 9 17 @ 0955 BY PARDA Performed at [facility]    Report Status 06/24/2015 FINAL  Final   Organism ID, Bacteria KLEBSIELLA PNEUMONIAE  Final      Susceptibility   Klebsiella pneumoniae - MIC*    AMPICILLIN >=32 RESISTANT Resistant     AMPICILLIN/SULBACTAM >=32 RESISTANT Resistant     CEFAZOLIN >=64 RESISTANT Resistant     CEFEPIME RESISTANT      CEFTAZIDIME 16 RESISTANT Resistant     CEFTRIAXONE >=64 RESISTANT Resistant     CIPROFLOXACIN 2 INTERMEDIATE Intermediate     IMIPENEM <=0.25 SENSITIVE Sensitive     PIP/TAZO  64 INTERMEDIATE Intermediate     TRIMETH/SULFA >=320 RESISTANT Resistant     LEVOFLOXACIN 1 SENSITIVE Sensitive     GENTAMICIN <=1 SENSITIVE Sensitive     TOBRAMYCIN >=16 RESISTANT Resistant     * MODERATE KLEBSIELLA PNEUMONIAE     Studies: Dg Chest Port 1 View  06/30/2015   CLINICAL DATA:  Aspiration, coughing, hypertension, cerebral palsy, seizures  EXAM: PORTABLE CHEST 1 VIEW  COMPARISON:  Portable exam 0227 hours compared 06/22/2015  FINDINGS: Normal heart size, mediastinal contours, and pulmonary vascularity.  Hazy BILATERAL infiltrates greatest at lung bases question multifocal pneumonia or aspiration pneumonitis.  No gross pleural effusion or pneumothorax.  Bones unremarkable.  Feeding tube coiled at the level of the diaphragm, cannot exclude within hiatal hernia.  IMPRESSION: Bibasilar infiltrates question multifocal pneumonia versus aspiration pneumonitis.  Feeding tube coiled at diaphragm, question within a  hiatal hernia.   Electronically Signed   By: Ulyses Southward M.D.   On: 06/30/2015 02:41   Dg Abd Portable 1v  06/30/2015   CLINICAL DATA:  Feeding tube placement.  EXAM: PORTABLE ABDOMEN - 1 VIEW  COMPARISON:  06/27/2015  FINDINGS: Feeding tube tip projects over the right upper quadrant consistent with location in the mid/distal stomach. Gas and stool throughout the colon. No small or large bowel distention. Thoracolumbar scoliosis and degenerative changes.  IMPRESSION: Feeding tube tip projects over the mid/ distal stomach.   Electronically Signed   By: Burman Nieves M.D.   On: 06/30/2015 01:22    Scheduled Meds: . antiseptic oral rinse  7 mL Mouth Rinse q12n4p  . baclofen  5 mg Oral TID  . chlorhexidine  15 mL Mouth Rinse BID  . cloNIDine  0.2 mg Per Tube BID  . cycloSPORINE  1 drop Both Eyes BID  . escitalopram  20 mg Per Tube Daily  . free water  100 mL Per Tube 3 times per day  . guaiFENesin  5 mL Oral BID  . imipenem-cilastatin  500 mg Intravenous Q8H  . lacosamide (VIMPAT) IV  150 mg Intravenous Q12H  . metoprolol  5 mg Intravenous 4 times per day  . pantoprazole (PROTONIX) IV  40 mg Intravenous Q12H  . QUEtiapine  25 mg Per Tube QHS  . tamsulosin  0.4 mg Oral Daily   Continuous Infusions: . dextrose 5 % and 0.9% NaCl 1,000 mL with potassium chloride 40 mEq infusion 50 mL/hr at 06/30/15 0655  . feeding supplement (JEVITY 1.2 CAL) 1,000 mL (06/30/15 1610)    Principal Problem:   Acute respiratory failure with hypoxia Active Problems:   HCAP (healthcare-associated pneumonia)   Depression   Mental retardation   Infantile cerebral palsy   Anemia   Seizure disorder   Esophagitis   Acute upper GI bleed   GI bleed   Ventilator dependent   Acute renal failure   Nausea & vomiting   Respiratory failure   Pneumonitis   Dysphagia, pharyngoesophageal phase   Acute blood loss anemia    Time spent: 40 MINS    [facility] MD [facility] Pager [phone]. If  7PM-7AM, please contact night-coverage at [email or web address], password [facility] 06/30/2015, 2:08 PM  LOS: 10 days

## 2015-06-30 NOTE — Progress Notes (Signed)
Pt's  Panda tube has been dislodged since 11:00 am. Attempted to reinsert, unable to do to excessive salivation, and nausea and vomiting. Paged Lenny Pastel NP, made aware, will leave out for now, and will reassess in the am. Estill Dooms, RN 10:34 PM 06/30/2015

## 2015-06-30 NOTE — Progress Notes (Signed)
NUTRITION NOTE  Pt seen for full follow-up 9/21. Per RN note early this AM, Panda tube was dislodged a few inches and was reinserted by charge RN.   At time of RD visit this AM pt was receiving Jevity 1.2 @ 15 mL/hr. Order in place for Jevity 1.2 @ 15 mL/hr with 100 mL free water Q8h and advance by 10 mL Q12h to goal rate of 55 mL/hr with 100 mL free water Q8h which will provide 1584 kcal, 73 grams protein, and 1365 mL free water.   RD will continue to monitor TF advancement and needs.    Trenton Gammon, RD, LDN Inpatient Clinical Dietitian Pager # [phone] After hours/weekend pager # [phone]

## 2015-06-30 NOTE — Progress Notes (Signed)
Per charge nurse panda tube was dislodged a couple of inches out of right nare. Reinserted by charge nurse. This RN notified NP on call. New order placed for abdominal x-ray. Will continue to monitor closely

## 2015-06-30 NOTE — Progress Notes (Signed)
Patient with yellow emesis x1. Oxygen 85% RA. BP 179/69. Patient placed on 2 L [place] sats 88-90%. Patient suctioned with little secretions removed. RRT called and arrived to bedside to assess patient. Oxygen increased to 4 L sats 96%. Patient with coarse breath sounds. Pt is alert and verbal. NP on call notified. New order placed. Chest x-ray completed. NP aware of results. Pt stable at this time. Will continue to monitor closely.

## 2015-06-30 NOTE — Care Management Important Message (Signed)
Important Message  Patient Details IM Letter given to Cookie/Case Manager to present to PatientImportant Message  Patient Details  Name: CHIA ROCK MRN: [id number] Date of Birth: 10-20-1950   Medicare Important Message Given:  Yes-second notification given    Haskell Flirt 06/30/2015, 11:09 AM Name: DONALDSON RICHTER MRN: [id number] Date of Birth: October 19, 1950   Medicare Important Message Given:  Yes-second notification given    Haskell Flirt 06/30/2015, 11:08 AM

## 2015-06-30 NOTE — Progress Notes (Signed)
Speech Language Pathology Treatment: Dysphagia  Patient Details Name: Colin Bradley MRN: [id number] DOB: September 07, 1951 Today's Date: 06/30/2015 Time: 1324-4010 SLP Time Calculation (min) (ACUTE ONLY): 21 min  Assessment / Plan / Recommendation Clinical Impression  Oral care completed prior to po trials; pt with intermittent wet vocal quality with puree and thin/ice chips and immediate cough with thin via tsp; noted oral residue on right labial corner with pt having limited awareness/? sensory component re: dysphagia; wet vocal quality/mild hoarseness noted prior to po intake as well; pt has improved with swallow function overall with intermittent wet vocal quality noted vs with all consistencies, but nursing informed SLP rapid response was called previous night d/t possible aspiration event.  MBS pending readiness; con't with current POC as pt is progressing with po readiness; con't to assess swallow function and determine MBS readiness vs PEG/non-oral nutrition d/t pt progression with goals.   HPI Other Pertinent Information: 64 yo male with h/o CP, intellectual disability, coffee ground emesis 9/13 - required intubation.  Pt extubated 06/26/15, swallow evaluation ordered.  Pt found to have right mid and lower lobe infiltrate.  EGD showing HH and no active bleed 9/13.    Pt resides at group home and is on a regular/chopped diet prior to admission.  He is a ward of the state.   Pertinent Vitals Pain Assessment: No/denies pain Faces Pain Scale: No hurt  SLP Plan  Continue with current plan of care    Recommendations Diet recommendations: NPO Medication Administration: Via alternative means              Oral Care Recommendations: Oral care QID Plan: Continue with current plan of care         ADAMS,PAT, M.S., CCC-SLP 06/30/2015, 10:47 AM

## 2015-07-01 ENCOUNTER — Inpatient Hospital Stay (HOSPITAL_COMMUNITY): Payer: Medicare Other

## 2015-07-01 DIAGNOSIS — F329 Major depressive disorder, single episode, unspecified: Secondary | ICD-10-CM

## 2015-07-01 LAB — RENAL FUNCTION PANEL
Albumin: 2.3 g/dL — ABNORMAL LOW (ref 3.5–5.0)
Anion gap: 9 (ref 5–15)
BUN: 10 mg/dL (ref 6–20)
CO2: 19 mmol/L — ABNORMAL LOW (ref 22–32)
Calcium: 8.2 mg/dL — ABNORMAL LOW (ref 8.9–10.3)
Chloride: 112 mmol/L — ABNORMAL HIGH (ref 101–111)
Creatinine, Ser: 0.79 mg/dL (ref 0.61–1.24)
GFR calc Af Amer: >60 mL/min (ref >60)
GFR calc non Af Amer: >60 mL/min (ref >60)
Glucose, Bld: 79 mg/dL (ref 65–99)
Phosphorus: 3.8 mg/dL (ref 2.5–4.6)
Potassium: 4.1 mmol/L (ref 3.5–5.1)
Sodium: 140 mmol/L (ref 135–145)

## 2015-07-01 LAB — GLUCOSE, CAPILLARY
GLUCOSE-CAPILLARY: 93 mg/dL (ref 65–99)
GLUCOSE-CAPILLARY: 82 mg/dL (ref 65–99)
Glucose-Capillary: 76 mg/dL (ref 65–99)
Glucose-Capillary: 82 mg/dL (ref 65–99)
Glucose-Capillary: 83 mg/dL (ref 65–99)
Glucose-Capillary: 87 mg/dL (ref 65–99)

## 2015-07-01 LAB — MAGNESIUM: MAGNESIUM: 2 mg/dL (ref 1.7–2.4)

## 2015-07-01 MED ORDER — CLONIDINE HCL 0.2 MG/24HR TD PTWK
0.2000 mg | MEDICATED_PATCH | TRANSDERMAL | Status: DC
  Administered 2015-07-01 – 2015-07-08 (×2): 0.2 mg via TRANSDERMAL
  Filled 2015-07-01 (×3): qty 1

## 2015-07-01 MED ORDER — METOPROLOL TARTRATE 1 MG/ML IV SOLN
7.5000 mg | Freq: Four times a day (QID) | INTRAVENOUS | Status: DC
  Administered 2015-07-02 – 2015-07-12 (×30): 7.5 mg via INTRAVENOUS
  Filled 2015-07-01 (×44): qty 10

## 2015-07-01 MED ORDER — RESOURCE THICKENUP CLEAR PO POWD
ORAL | Status: DC | PRN
  Filled 2015-07-01 (×2): qty 125

## 2015-07-01 NOTE — Procedures (Signed)
Objective Swallowing Evaluation:  (MBS)  Patient Details  Name: Colin Bradley MRN: [id number] Date of Birth: [birth year]  Today's Date: 07/01/2015 Time: SLP Start Time (ACUTE ONLY): 1300-SLP Stop Time (ACUTE ONLY): 1315 SLP Time Calculation (min) (ACUTE ONLY): 15 min  Past Medical History:  Past Medical History  Diagnosis Date  . Seizures   . Cerebral palsy   . Hypertension   . Mental retardation   . Depression   . Nephrolithiasis   . BPH (benign prostatic hyperplasia)   . GI bleed 2009    coffee ground emesis, erosive esophagitis on EGD by Dr Juanda Chance  . Esophageal stricture 2009    not dilated during the EGD, biopsy benign:  no Barretts  . Depression with anxiety    Past Surgical History:  Past Surgical History  Procedure Laterality Date  . Basal cell carcinoma excision  2002    forehead  . Squamous cell carcinoma excision  2004    lip  . Bowen dz  2002    right cheek   . Esophagogastroduodenoscopy  06/14/2012    Procedure: ESOPHAGOGASTRODUODENOSCOPY (EGD);  Surgeon: Rachael Fee, MD;  Location: [facility] ENDOSCOPY;  Service: Endoscopy;  Laterality: N/A;  . Multiple extractions with alveoloplasty N/A 06/20/2014    Procedure: MULTIPLE EXTRACTIONS TEETH NUMBER TWO, TEN, TWELVE, EIGHTEEN, TWENTY-ONE, TWENTY-TWO, TWENTY-NINE, THIRTY-TWO;  Surgeon: Georgia Lopes, DDS;  Location: [facility] OR;  Service: Oral Surgery;  Laterality: N/A;  . Esophagogastroduodenoscopy N/A 06/20/2015    Procedure: ESOPHAGOGASTRODUODENOSCOPY (EGD);  Surgeon: Ruffin Frederick, MD;  Location: [facility] ENDOSCOPY;  Service: Gastroenterology;  Laterality: N/A;   HPI:  Other Pertinent Information: 64 yo male with h/o CP, intellectual disability, coffee ground emesis 9/13 - required intubation.  Pt extubated 06/26/15, swallow evaluation ordered.  Pt found to have right mid and lower lobe infiltrate.  EGD showing HH and no active bleed 9/13.    Pt resides at group home and is on a regular/chopped diet prior to admission.   He is a ward of the state.  No Data Recorded  Assessment / Plan / Recommendation CHL IP CLINICAL IMPRESSIONS 07/01/2015  Therapy Diagnosis Moderate oral phase dysphagia;Severe oral phase dysphagia;Moderate pharyngeal phase dysphagia  Clinical Impression Moderate-severe oral dysphagi described as poor labial closure and arrythymical lingual movements and decreased oral cohesion characteristic of cerebral palsy. Moderate sensorimotor pharyngeal dysphagia exhibiting decreased sensation leading swallow initiation primaily at pyriform sinuses with thinner viscocities. Moderate (sometimes max) vallecular and pyriform sinus residue reduced with cue for second swallow. Laryngeal penetration (silent with nectar) and eventual aspiraiton. Pt unable to produce effective volitional cough. Initiating a Dys 1 diet texture and honey thick liquids is not without risk, however recommend these modified textures with ST follow up for follow through of compensatory strategies: crush meds, sit upright, small bites/sips (tsp size preferred) and ask pt to swallow twice.         CHL IP TREATMENT RECOMMENDATION 12/05/2014  Treatment Recommendations Therapy as outlined in treatment plan below     CHL IP DIET RECOMMENDATION 06/30/2015  SLP Diet Recommendations (None)  Liquid Administration via (None)  Medication Administration Via alternative means  Compensations (None)  Postural Changes and/or Swallow Maneuvers (None)     CHL IP OTHER RECOMMENDATIONS 06/30/2015  Recommended Consults (None)  Oral Care Recommendations Oral care QID  Other Recommendations (None)     CHL IP FOLLOW UP RECOMMENDATIONS 06/29/2015  Follow up Recommendations (No Data)     CHL IP FREQUENCY AND DURATION 06/27/2015  Speech Therapy  Frequency (ACUTE ONLY) min 2x/week  Treatment Duration 2 weeks     Pertinent Vitals/Pain none    SLP Swallow Goals No flowsheet data found.  No flowsheet data found.    CHL IP REASON FOR REFERRAL 07/01/2015   Reason for Referral Objectively evaluate swallowing function               No flowsheet data found.         Royce Macadamia 07/01/2015, 1:38 PM  Breck Coons Lonell Face.Ed [employer] [phone]

## 2015-07-01 NOTE — Progress Notes (Signed)
[facility] PROGRESS NOTE  Colin Bradley ZOX:[id number] DOB: 1951/07/28 DOA: 06/20/2015 PCP: Julian Hy, MD  Assessment/Plan: #1 acute hypoxic vent dependent respiratory failure Likely secondary to HCAP-ESBL Klebsiella per tracheal aspirate. Patient with some improvement in secretions. No emesis this morning. Continue suctioning. Patient status post extubation. Continue empiric antibiotics of Primaxin, nebulizer treatments, oxygen, mucolytics and chest PT. Follow.  #2 HCAP-ESBL Klebsiella/aspiration pneumonia Patient still with lots of secretions and ongoing emesis yesterday that has since improved. Rhonchi and coarse breath sounds have improved on examination. Continue oxygen, nebulizers, IV Primaxin, mucolytics and chest PT. Follow.  #3 upper GI bleed Status post upper endoscopy with erosions at the GE junction may be the likely source, but no active bleeding noted. Mallory-Weiss tear might be a consideration. No further bleeding noted. Hemoglobin has remained stable. Continue PPI. GI has signed off.  #4 dysphagia Patient failed swallow evaluation. Patient had a small bore NG tube in place however this was accidentally dislodged. Patient was reassessed by speech therapy today initially placed on a diet however per nursing patient had some significant emesis once diet was started. Will place patient back on nothing by mouth status. We'll try to have panda tube be replaced. Speech therapy following. Patient is to be reassessed in a few days and if still has failed swallowing may likely need a PEG tube.  #5 acute blood loss anemia Secondary to problem #3. Patient with no further signs of bleeding. Patient status post upper endoscopy with erosions seen at the GE junction. Hemoglobin is stable at 8.2.Transfusion threshold hemoglobin less than 7. Follow.  #6 sinus tachycardia Resolved. Continue Lopressor. Change oral clonidine to her patch as panda tube is out.  #7 acute renal  failure On admission patient was noted to have a creatinine of 1.43 with a BUN of 40 with a GFR in the 50s. Improved with hydration and renal function currently with a creatinine of 0.79 and a BUN of 10. Resolved.  #8 hypokalemia Repleted. Keep magnesium greater than 2.  #9 cerebral palsy/history of intellectual disability stable. Likely at baseline.  #10 history of seizure disorder Stable. Continue Vimpat.  #11 prophylaxis PPI for GI prophylaxis. SCDs for DVT prophylaxis.     Code Status: Full Family Communication: Patient is a ward of the state. No family at bedside. Patient updated. Disposition Plan: Back to group home when medically stable.   Consultants:  PCCM admit 06/20/2015 Dr Jamison Neighbor  Gastroenterology: Dr. Adela Lank 06/20/2015  Procedures:  Upper endoscopy 06/20/2015-per Dr. Lynnell Dike changes at the GE junction. Hiatial hernia.?? Mallory-Weiss tear  Chest x-ray 06/22/2015  Abdominal films 06/20/2015, 06/23/2015, 06/27/2015  Renal ultrasound 06/20/2015 SIGNIFICANT EVENTS: 9/13 Intubated & admitted Upper GIB 9/13 EGD w/ hiatal hernia but no source of active bleeding 9/14 hyperchloremic acidosis  9/15 tachycardic, lopressor restarted. Continued on rx for pneumonia  9/16 WBC trending down, hgb stable, still w/ NAG metabolic acidosis.  9/18 hgb down w/o active bleeding. Patient more awake. 9/19 Extubated    Antibiotics: Primaxin 9/16 >> Unasyn 9/13>>9/16   HPI/Subjective: Patient laying in bed. Patient denies chest pain or shortness of breath. Pending tube has been out since yesterday. Patient was assessed by speech therapy and initial diet was placed have a per nursing patient with significant emesis.  Objective: Filed Vitals:   07/01/15 0317  BP: 163/55  Pulse: 79  Temp: 97.7 F (36.5 C)  Resp: 20    Intake/Output Summary (Last 24 hours) at 07/01/15 0955 Last data filed at 07/01/15 0500  Gross  per 24 hour  Intake    825 ml   Output   1175 ml  Net   -350 ml   Filed Weights   06/29/15 1338 06/30/15 0632 07/01/15 0317  Weight: 66.679 kg (147 lb) 64.7 kg (142 lb 10.2 oz) 66.6 kg (146 lb 13.2 oz)    Exam:   General:  Laying in bed. NAD  Cardiovascular: Regular rate rhythm no murmurs rubs or gallops.  Respiratory: CTAB anterior lung fields. No wheezing.  Abdomen: Soft, nontender, nondistended, positive bowel sounds.  Musculoskeletal: No clubbing cyanosis or edema.   Data Reviewed: Basic Metabolic Panel:  Recent Labs Lab 06/27/15 0404 06/28/15 0345 06/29/15 0407 06/30/15 0429 07/01/15 0529  NA 142  143 139 142 140 140  K 3.6  3.6 3.3* 4.1 4.2 4.1  CL 112*  113* 112* 117* 115* 112*  CO2 19*  19* 20* 21* 20* 19*  GLUCOSE 93  94 105* 96 127* 79  BUN CREATININE 0.96  0.96 0.96 0.95 0.76 0.79  CALCIUM 8.5*  8.4* 8.2* 8.2* 8.1* 8.2*  MG 1.9 1.7 2.0 1.7 2.0  PHOS 4.2 4.1 3.7 3.5 3.8   Liver Function Tests:  Recent Labs Lab 06/27/15 0404 06/28/15 0345 06/29/15 0407 06/30/15 0429 07/01/15 0529  ALBUMIN 2.5* 2.3* 2.3* 2.1* 2.3*   No results for input(s): LIPASE, AMYLASE in the last 168 hours. No results for input(s): AMMONIA in the last 168 hours. CBC:  Recent Labs Lab 06/25/15 0407  06/25/15 1714 06/26/15 0414 06/26/15 1205 06/27/15 0404 06/29/15 0407 06/30/15 0429  WBC 4.9  --  4.5 5.2 4.4 5.3 7.8 7.9  NEUTROABS 2.7  --  3.0 2.9 2.3 3.2  --   --   HGB 7.5*  < > 8.5* 7.1* 7.6* 8.3* 8.4* 8.2*  HCT 21.8*  < > 25.2* 21.0* 23.1* 24.7* 25.9* 24.9*  MCV 96.9  --  97.3 98.1 97.5 97.6 100.0 98.0  PLT 137*  --  178 178 183 209 262 288  < > = values in this interval not displayed. Cardiac Enzymes: No results for input(s): CKTOTAL, CKMB, CKMBINDEX, TROPONINI in the last 168 hours. BNP (last 3 results)  Recent Labs  12/03/14 1650  BNP 133.7*    ProBNP (last 3 results) No results for input(s): PROBNP in the last 8760 hours.  CBG:  Recent Labs Lab  06/30/15 1645 06/30/15 2153 07/01/15 0017 07/01/15 0307 07/01/15 0815  GLUCAP 72 76 82 76 87    No results found for this or any previous visit (from the past 240 hour(s)).   Studies: Dg Chest Port 1 View  06/30/2015   CLINICAL DATA:  Aspiration, coughing, hypertension, cerebral palsy, seizures  EXAM: PORTABLE CHEST 1 VIEW  COMPARISON:  Portable exam 0227 hours compared 06/22/2015  FINDINGS: Normal heart size, mediastinal contours, and pulmonary vascularity.  Hazy BILATERAL infiltrates greatest at lung bases question multifocal pneumonia or aspiration pneumonitis.  No gross pleural effusion or pneumothorax.  Bones unremarkable.  Feeding tube coiled at the level of the diaphragm, cannot exclude within hiatal hernia.  IMPRESSION: Bibasilar infiltrates question multifocal pneumonia versus aspiration pneumonitis.  Feeding tube coiled at diaphragm, question within a hiatal hernia.   Electronically Signed   By: Ulyses Southward M.D.   On: 06/30/2015 02:41   Dg Abd Portable 1v  06/30/2015   CLINICAL DATA:  Feeding tube placement.  EXAM: PORTABLE ABDOMEN - 1 VIEW  COMPARISON:  06/27/2015  FINDINGS: Feeding tube tip  projects over the right upper quadrant consistent with location in the mid/distal stomach. Gas and stool throughout the colon. No small or large bowel distention. Thoracolumbar scoliosis and degenerative changes.  IMPRESSION: Feeding tube tip projects over the mid/ distal stomach.   Electronically Signed   By: Burman Nieves M.D.   On: 06/30/2015 01:22    Scheduled Meds: . antiseptic oral rinse  7 mL Mouth Rinse q12n4p  . baclofen  5 mg Oral TID  . chlorhexidine  15 mL Mouth Rinse BID  . cloNIDine  0.2 mg Per Tube BID  . cycloSPORINE  1 drop Both Eyes BID  . escitalopram  20 mg Per Tube Daily  . free water  100 mL Per Tube 3 times per day  . guaiFENesin  5 mL Oral BID  . imipenem-cilastatin  500 mg Intravenous Q8H  . lacosamide (VIMPAT) IV  150 mg Intravenous Q12H  . metoprolol  5 mg  Intravenous 4 times per day  . pantoprazole (PROTONIX) IV  40 mg Intravenous Q12H  . QUEtiapine  25 mg Per Tube QHS  . tamsulosin  0.4 mg Oral Daily   Continuous Infusions: . dextrose 5 % and 0.9% NaCl 1,000 mL with potassium chloride 40 mEq infusion 50 mL/hr at 06/30/15 0655  . feeding supplement (JEVITY 1.2 CAL) 1,000 mL (06/30/15 1478)    Principal Problem:   Acute respiratory failure with hypoxia Active Problems:   HCAP (healthcare-associated pneumonia)   Depression   Mental retardation   Infantile cerebral palsy   Anemia   Seizure disorder   Esophagitis   Acute upper GI bleed   GI bleed   Ventilator dependent   Acute renal failure   Nausea & vomiting   Respiratory failure   Pneumonitis   Dysphagia, pharyngoesophageal phase   Acute blood loss anemia    Time spent: 40 MINS    [facility] MD [facility] Pager [phone]. If 7PM-7AM, please contact night-coverage at [email or web address], password [facility] 07/01/2015, 9:55 AM  LOS: 11 days

## 2015-07-02 ENCOUNTER — Inpatient Hospital Stay (HOSPITAL_COMMUNITY): Payer: Medicare Other

## 2015-07-02 LAB — RENAL FUNCTION PANEL
ANION GAP: 4 — AB (ref 5–15)
Albumin: 2.1 g/dL — ABNORMAL LOW (ref 3.5–5.0)
BUN: 8 mg/dL (ref 6–20)
CALCIUM: 8 mg/dL — AB (ref 8.9–10.3)
CO2: 20 mmol/L — AB (ref 22–32)
Chloride: 116 mmol/L — ABNORMAL HIGH (ref 101–111)
Creatinine, Ser: 0.76 mg/dL (ref 0.61–1.24)
GFR calc Af Amer: >60 mL/min (ref >60)
GFR calc non Af Amer: >60 mL/min (ref >60)
GLUCOSE: 86 mg/dL (ref 65–99)
Phosphorus: 3.7 mg/dL (ref 2.5–4.6)
Potassium: 4.3 mmol/L (ref 3.5–5.1)
SODIUM: 140 mmol/L (ref 135–145)

## 2015-07-02 LAB — CBC
HCT: 26.6 % — ABNORMAL LOW (ref 39.0–52.0)
HEMOGLOBIN: 8.7 g/dL — AB (ref 13.0–17.0)
MCH: 31.9 pg (ref 26.0–34.0)
MCHC: 32.7 g/dL (ref 30.0–36.0)
MCV: 97.4 fL (ref 78.0–100.0)
Platelets: 407 10*3/uL — ABNORMAL HIGH (ref 150–400)
RBC: 2.73 MIL/uL — ABNORMAL LOW (ref 4.22–5.81)
RDW: 13.8 % (ref 11.5–15.5)
WBC: 7.7 10*3/uL (ref 4.0–10.5)

## 2015-07-02 LAB — MAGNESIUM: MAGNESIUM: 1.7 mg/dL (ref 1.7–2.4)

## 2015-07-02 LAB — GLUCOSE, CAPILLARY
GLUCOSE-CAPILLARY: 139 mg/dL — AB (ref 65–99)
GLUCOSE-CAPILLARY: 62 mg/dL — AB (ref 65–99)
GLUCOSE-CAPILLARY: 75 mg/dL (ref 65–99)
GLUCOSE-CAPILLARY: 87 mg/dL (ref 65–99)
Glucose-Capillary: 79 mg/dL (ref 65–99)

## 2015-07-02 MED ORDER — MAGNESIUM SULFATE 2 GM/50ML IV SOLN
2.0000 g | Freq: Once | INTRAVENOUS | Status: AC
  Administered 2015-07-02: 2 g via INTRAVENOUS
  Filled 2015-07-02: qty 50

## 2015-07-02 MED ORDER — DEXTROSE 50 % IV SOLN
INTRAVENOUS | Status: AC
  Administered 2015-07-02: 25 mL
  Filled 2015-07-02: qty 50

## 2015-07-02 MED ORDER — DEXTROSE 50 % IV SOLN
INTRAVENOUS | Status: AC
  Administered 2015-07-02: 50 mL
  Filled 2015-07-02: qty 50

## 2015-07-02 NOTE — Progress Notes (Signed)
[facility] PROGRESS NOTE  Colin Bradley ZOX:[id number] DOB: Jul 24, 1951 DOA: 06/20/2015 PCP: Julian Hy, MD  Assessment/Plan: #1 acute hypoxic vent dependent respiratory failure Likely secondary to HCAP-ESBL Klebsiella per tracheal aspirate. Patient with some improvement in secretions. Patient with significant emesis when diet was tried yesterday. Continue suctioning. Patient status post extubation. Continue empiric antibiotics of Primaxin, nebulizer treatments, oxygen, mucolytics and chest PT. Follow.  #2 HCAP-ESBL Klebsiella/aspiration pneumonia Patient still with lots of secretions and ongoing emesis yesterday that has since improved. Rhonchi and coarse breath sounds on examination. Continue oxygen, nebulizers, IV Primaxin, mucolytics and chest PT. Follow.  #3 upper GI bleed Status post upper endoscopy with erosions at the GE junction may be the likely source, but no active bleeding noted. Mallory-Weiss tear might be a consideration. No further bleeding noted. Hemoglobin has remained stable. Continue PPI. GI has signed off.  #4 dysphagia Patient failed swallow evaluation. Patient had a small bore NG tube in place however this was accidentally dislodged. Patient was reassessed by speech therapy today initially placed on a diet however per nursing patient had some significant emesis once diet was started. Panda was to be placed back in however in the wrong position and difficult to be placed in a such will need to be placed under IR. Keep patient nothing by mouth status. Speech therapy following. Patient is to be reassessed in a few days and if still has failed swallowing may likely need a PEG tube.  #5 acute blood loss anemia Secondary to problem #3. Patient with no further signs of bleeding. Patient status post upper endoscopy with erosions seen at the GE junction. Hemoglobin is stable at 8.7.Transfusion threshold hemoglobin less than 7. Follow.  #6 sinus  tachycardia Resolved. Continue Lopressor. Continue clonidine patch as panda tube is out.  #7 acute renal failure On admission patient was noted to have a creatinine of 1.43 with a BUN of 40 with a GFR in the 50s. Improved with hydration and renal function currently with a creatinine of 0.76 and a BUN of 10. Resolved.  #8 hypokalemia Repleted. Keep magnesium greater than 2.  #9 cerebral palsy/history of intellectual disability stable. Likely at baseline.  #10 history of seizure disorder Stable. Continue Vimpat.  #11 prophylaxis PPI for GI prophylaxis. SCDs for DVT prophylaxis.     Code Status: Full Family Communication: Patient is a ward of the state. No family at bedside. Patient updated. Disposition Plan: Back to group home when medically stable.   Consultants:  PCCM admit 06/20/2015 Dr Jamison Neighbor  Gastroenterology: Dr. Adela Lank 06/20/2015  Procedures:  Upper endoscopy 06/20/2015-per Dr. Lynnell Dike changes at the GE junction. Hiatial hernia.?? Mallory-Weiss tear  Chest x-ray 06/22/2015  Abdominal films 06/20/2015, 06/23/2015, 06/27/2015  Renal ultrasound 06/20/2015 SIGNIFICANT EVENTS: 9/13 Intubated & admitted Upper GIB 9/13 EGD w/ hiatal hernia but no source of active bleeding 9/14 hyperchloremic acidosis  9/15 tachycardic, lopressor restarted. Continued on rx for pneumonia  9/16 WBC trending down, hgb stable, still w/ NAG metabolic acidosis.  9/18 hgb down w/o active bleeding. Patient more awake. 9/19 Extubated    Antibiotics: Primaxin 9/16 >> Unasyn 9/13>>9/16   HPI/Subjective: Patient laying in bed. Patient denies chest pain or shortness of breath. Panda tube not in place.  Objective: Filed Vitals:   07/02/15 1220  BP: 139/112  Pulse: 94  Temp:   Resp:     Intake/Output Summary (Last 24 hours) at 07/02/15 1324 Last data filed at 07/02/15 0411  Gross per 24 hour  Intake  100 ml  Output   1200 ml  Net  -1100 ml   Filed  Weights   06/30/15 0632 07/01/15 0317 07/02/15 0414  Weight: 64.7 kg (142 lb 10.2 oz) 66.6 kg (146 lb 13.2 oz) 64.7 kg (142 lb 10.2 oz)    Exam:   General:  Laying in bed. NAD  Cardiovascular: Regular rate rhythm no murmurs rubs or gallops.  Respiratory: No wheezing.Some coarse BS  Abdomen: Soft, nontender, nondistended, positive bowel sounds.  Musculoskeletal: No clubbing cyanosis or edema.   Data Reviewed: Basic Metabolic Panel:  Recent Labs Lab 06/28/15 0345 06/29/15 0407 06/30/15 0429 07/01/15 0529 07/02/15 0418  NA 139 142 140 140 140  K 3.3* 4.1 4.2 4.1 4.3  CL 112* 117* 115* 112* 116*  CO2 20* 21* 20* 19* 20*  GLUCOSE 105* 96 127* 79 86  BUN CREATININE 0.96 0.95 0.76 0.79 0.76  CALCIUM 8.2* 8.2* 8.1* 8.2* 8.0*  MG 1.7 2.0 1.7 2.0 1.7  PHOS 4.1 3.7 3.5 3.8 3.7   Liver Function Tests:  Recent Labs Lab 06/28/15 0345 06/29/15 0407 06/30/15 0429 07/01/15 0529 07/02/15 0418  ALBUMIN 2.3* 2.3* 2.1* 2.3* 2.1*   No results for input(s): LIPASE, AMYLASE in the last 168 hours. No results for input(s): AMMONIA in the last 168 hours. CBC:  Recent Labs Lab 06/25/15 1714 06/26/15 0414 06/26/15 1205 06/27/15 0404 06/29/15 0407 06/30/15 0429 07/02/15 0418  WBC 4.5 5.2 4.4 5.3 7.8 7.9 7.7  NEUTROABS 3.0 2.9 2.3 3.2  --   --   --   HGB 8.5* 7.1* 7.6* 8.3* 8.4* 8.2* 8.7*  HCT 25.2* 21.0* 23.1* 24.7* 25.9* 24.9* 26.6*  MCV 97.3 98.1 97.5 97.6 100.0 98.0 97.4  PLT 178 178 183 209 262 288 407*   Cardiac Enzymes: No results for input(s): CKTOTAL, CKMB, CKMBINDEX, TROPONINI in the last 168 hours. BNP (last 3 results)  Recent Labs  12/03/14 1650  BNP 133.7*    ProBNP (last 3 results) No results for input(s): PROBNP in the last 8760 hours.  CBG:  Recent Labs Lab 07/02/15 0021 07/02/15 0406 07/02/15 0736 07/02/15 1136 07/02/15 1230  GLUCAP 87 79 75 62* 139*    No results found for this or any previous visit (from the past 240  hour(s)).   Studies: Dg Abd Portable 1v  07/02/2015   CLINICAL DATA:  Reposition of feeding tube  EXAM: PORTABLE ABDOMEN - 1 VIEW  COMPARISON:  07/02/2015  FINDINGS: Feeding tube has been advanced, coiling in the distal stomach with the tip in the antrum of the stomach. Nonobstructive bowel gas pattern.  IMPRESSION: Feeding tube coils in the distal stomach with the tip likely in the antrum.   Electronically Signed   By: Charlett Nose M.D.   On: 07/02/2015 07:22   Dg Abd Portable 1v  07/02/2015   CLINICAL DATA:  Feeding tube placement.  EXAM: PORTABLE ABDOMEN - 1 VIEW  COMPARISON:  06/30/2015  FINDINGS: Feeding tube tip in the left upper quadrant consistent with location in the upper stomach. Residual contrast material throughout the colon. Degenerative changes in the spine.  IMPRESSION: Feeding tube tip projects over the upper stomach.   Electronically Signed   By: Burman Nieves M.D.   On: 07/02/2015 05:30   Dg Swallowing Func-speech Pathology  07/01/2015   Abe People, CCC-SLP     07/01/2015  1:39 PM  Objective Swallowing Evaluation:  (MBS)  Patient Details  Name: RASOOL ROMMEL MRN:  [id number] Date of Birth: December 29, 1950  Today's Date: 07/01/2015 Time: SLP Start Time (ACUTE ONLY): 1300-SLP Stop Time (ACUTE  ONLY): 1315 SLP Time Calculation (min) (ACUTE ONLY): 15 min  Past Medical History:  Past Medical History  Diagnosis Date  . Seizures   . Cerebral palsy   . Hypertension   . Mental retardation   . Depression   . Nephrolithiasis   . BPH (benign prostatic hyperplasia)   . GI bleed 2009    coffee ground emesis, erosive esophagitis on EGD by Dr Juanda Chance  . Esophageal stricture 2009    not dilated during the EGD, biopsy benign:  no Barretts  . Depression with anxiety    Past Surgical History:  Past Surgical History  Procedure Laterality Date  . Basal cell carcinoma excision  2002    forehead  . Squamous cell carcinoma excision  2004    lip  . Bowen dz  2002    right cheek   . Esophagogastroduodenoscopy   06/14/2012    Procedure: ESOPHAGOGASTRODUODENOSCOPY (EGD);  Surgeon: Rachael Fee, MD;  Location[facility] ENDOSCOPY;  Service: Endoscopy;   Laterality: N/A;  . Multiple extractions with alveoloplasty N/A 06/20/2014    Procedure: MULTIPLE EXTRACTIONS TEETH NUMBER TWO, TEN, TWELVE,  EIGHTEEN, TWENTY-ONE, TWENTY-TWO, TWENTY-NINE, THIRTY-TWO;   Surgeon: Georgia Lopes, DDS;  Location: [facility] OR;  Service: Oral  Surgery;  Laterality: N/A;  . Esophagogastroduodenoscopy N/A 06/20/2015    Procedure: ESOPHAGOGASTRODUODENOSCOPY (EGD);  Surgeon: Ruffin Frederick, MD;  Location: [facility] ENDOSCOPY;  Service:  Gastroenterology;  Laterality: N/A;   HPI:  Other Pertinent Information: 64 yo male with h/o CP, intellectual  disability, coffee ground emesis 9/13 - required intubation.  Pt  extubated 06/26/15, swallow evaluation ordered.  Pt found to have  right mid and lower lobe infiltrate.  EGD showing HH and no  active bleed 9/13.    Pt resides at group home and is on a  regular/chopped diet prior to admission.  He is a ward of the  state.  No Data Recorded  Assessment / Plan / Recommendation CHL IP CLINICAL IMPRESSIONS 07/01/2015  Therapy Diagnosis Moderate oral phase dysphagia;Severe oral phase  dysphagia;Moderate pharyngeal phase dysphagia  Clinical Impression Moderate-severe oral dysphagi described as  poor labial closure and arrythymical lingual movements and  decreased oral cohesion characteristic of cerebral palsy.  Moderate sensorimotor pharyngeal dysphagia exhibiting decreased  sensation leading swallow initiation primaily at pyriform sinuses  with thinner viscocities. Moderate (sometimes max) vallecular and  pyriform sinus residue reduced with cue for second swallow.  Laryngeal penetration (silent with nectar) and eventual  aspiraiton. Pt unable to produce effective volitional cough.  Initiating a Dys 1 diet texture and honey thick liquids is not  without risk, however recommend these modified textures with ST  follow up for follow  through of compensatory strategies: crush  meds, sit upright, small bites/sips (tsp size preferred) and ask  pt to swallow twice.         CHL IP TREATMENT RECOMMENDATION 12/05/2014  Treatment Recommendations Therapy as outlined in treatment plan  below     CH[facility] IP DIET RECOMMENDATION 06/30/2015  SLP Diet Recommendations (None)  Liquid Administration via (None)  Medication Administration Via alternative means  Compensations (None)  Postural Changes and/or Swallow Maneuvers (None)     CHL IP OTHER RECOMMENDATIONS 06/30/2015  Recommended Consults (None)  Oral Care Recommendations Oral care QID  Other Recommendations (None)     CHL IP FOLLOW UP RECOMMENDATIONS 06/29/2015  Follow up  Recommendations (No Data)     CHL IP FREQUENCY AND DURATION 06/27/2015  Speech Therapy Frequency (ACUTE ONLY) min 2x/week  Treatment Duration 2 weeks     Pertinent Vitals/Pain none    SLP Swallow Goals No flowsheet data found.  No flowsheet data found.    CH[facility] IP REASON FOR REFERRAL 07/01/2015  Reason for Referral Objectively evaluate swallowing function               No flowsheet data found.         Royce Macadamia 07/01/2015, 1:38 PM  Breck Coons Lonell Face.Ed CCC-SLP Pager [phone]      Scheduled Meds: . antiseptic oral rinse  7 mL Mouth Rinse q12n4p  . baclofen  5 mg Oral TID  . chlorhexidine  15 mL Mouth Rinse BID  . cloNIDine  0.2 mg Transdermal Weekly  . cycloSPORINE  1 drop Both Eyes BID  . escitalopram  20 mg Per Tube Daily  . free water  100 mL Per Tube 3 times per day  . guaiFENesin  5 mL Oral BID  . imipenem-cilastatin  500 mg Intravenous Q8H  . lacosamide (VIMPAT) IV  150 mg Intravenous Q12H  . metoprolol  7.5 mg Intravenous 4 times per day  . pantoprazole (PROTONIX) IV  40 mg Intravenous Q12H  . QUEtiapine  25 mg Per Tube QHS  . tamsulosin  0.4 mg Oral Daily   Continuous Infusions: . dextrose 5 % and 0.9% NaCl 1,000 mL with potassium chloride 40 mEq infusion 50 mL/hr at 07/02/15 0508  . feeding supplement (JEVITY  1.2 CAL) 1,000 mL (06/30/15 6295)    Principal Problem:   Acute respiratory failure with hypoxia Active Problems:   HCAP (healthcare-associated pneumonia)   Depression   Mental retardation   Infantile cerebral palsy   Anemia   Seizure disorder   Esophagitis   Acute upper GI bleed   GI bleed   Ventilator dependent   Acute renal failure   Nausea & vomiting   Respiratory failure   Pneumonitis   Dysphagia, pharyngoesophageal phase   Acute blood loss anemia    Time spent: 40 MINS    [facility] MD [facility] Pager [phone]. If 7PM-7AM, please contact night-coverage at [email or web address], password [facility] 07/02/2015, 1:24 PM  LOS: 12 days

## 2015-07-02 NOTE — Progress Notes (Signed)
Hypoglycemic Event  CBG: 62  Treatment: amp of d50  Symptoms: none  Follow-up CBG: Time:1220 CBG Result:139  Possible Reasons for Event: npo waiting tube feed placement under fluro  Comments/MD notified:MD texted paged    Tamala Bari A  Remember to initiate Hypoglycemia Order Set & complete

## 2015-07-03 ENCOUNTER — Inpatient Hospital Stay (HOSPITAL_COMMUNITY): Payer: Medicare Other

## 2015-07-03 DIAGNOSIS — D72829 Elevated white blood cell count, unspecified: Secondary | ICD-10-CM

## 2015-07-03 DIAGNOSIS — Z515 Encounter for palliative care: Secondary | ICD-10-CM

## 2015-07-03 LAB — RENAL FUNCTION PANEL
ANION GAP: 7 (ref 5–15)
Albumin: 2.1 g/dL — ABNORMAL LOW (ref 3.5–5.0)
BUN: 7 mg/dL (ref 6–20)
CHLORIDE: 114 mmol/L — AB (ref 101–111)
CO2: 19 mmol/L — AB (ref 22–32)
Calcium: 8 mg/dL — ABNORMAL LOW (ref 8.9–10.3)
Creatinine, Ser: 0.81 mg/dL (ref 0.61–1.24)
GFR calc non Af Amer: >60 mL/min (ref >60)
Glucose, Bld: 101 mg/dL — ABNORMAL HIGH (ref 65–99)
POTASSIUM: 4.3 mmol/L (ref 3.5–5.1)
Phosphorus: 3.4 mg/dL (ref 2.5–4.6)
Sodium: 140 mmol/L (ref 135–145)

## 2015-07-03 LAB — MAGNESIUM: Magnesium: 1.8 mg/dL (ref 1.7–2.4)

## 2015-07-03 LAB — CBC
HEMATOCRIT: 23.6 % — AB (ref 39.0–52.0)
HEMOGLOBIN: 8.1 g/dL — AB (ref 13.0–17.0)
MCH: 32.7 pg (ref 26.0–34.0)
MCHC: 34.3 g/dL (ref 30.0–36.0)
MCV: 95.2 fL (ref 78.0–100.0)
Platelets: 452 10*3/uL — ABNORMAL HIGH (ref 150–400)
RBC: 2.48 MIL/uL — AB (ref 4.22–5.81)
RDW: 13.5 % (ref 11.5–15.5)
WBC: 15.6 10*3/uL — ABNORMAL HIGH (ref 4.0–10.5)

## 2015-07-03 MED ORDER — FUROSEMIDE 10 MG/ML IJ SOLN
40.0000 mg | Freq: Once | INTRAMUSCULAR | Status: AC
  Administered 2015-07-03: 40 mg via INTRAVENOUS
  Filled 2015-07-03: qty 4

## 2015-07-03 MED ORDER — QUETIAPINE FUMARATE 25 MG PO TABS
25.0000 mg | ORAL_TABLET | Freq: Every day | ORAL | Status: DC
  Administered 2015-07-03 – 2015-07-11 (×9): 25 mg via ORAL
  Filled 2015-07-03 (×10): qty 1

## 2015-07-03 NOTE — Progress Notes (Signed)
[facility] PROGRESS NOTE  Colin Bradley ZOX:[id number] DOB: Feb 08, 1951 DOA: 06/20/2015 PCP: Julian Hy, MD  Assessment/Plan: #1 acute hypoxic vent dependent respiratory failure Likely secondary to HCAP-ESBL Klebsiella per tracheal aspirate. Patient with some improvement in secretions. Patient with significant emesis when diet was tried. Continue suctioning. Patient status post extubation. Continue empiric antibiotics of Primaxin, nebulizer treatments, oxygen, mucolytics and chest PT. Will give a dose of IV Lasix 1. Follow.  #2 HCAP-ESBL Klebsiella/aspiration pneumonia Patient had lots of secretions and ongoing emesis that has since improved. Rhonchi and coarse breath sounds on examination. Repeat chest x-ray with change in appearance of asymmetric airspace disease greater on the right with bilateral pleural effusions suspected which may represent asymmetric pulmonary edema. Patient noted to have a leukocytosis and concern for possible aspiration. Continue oxygen, nebulizers, IV Primaxin, mucolytics and chest PT. Will give a dose of IV Lasix. Follow.  #3 upper GI bleed Status post upper endoscopy with erosions at the GE junction may be the likely source, but no active bleeding noted. Mallory-Weiss tear might be a consideration. No further bleeding noted. Hemoglobin has remained stable. Continue PPI. GI has signed off.  #4 dysphagia Patient failed swallow evaluation initially. Patient had a small bore NG tube in place however this was accidentally dislodged. Patient was reassessed by speech therapy today initially placed on a diet however per nursing patient had some significant emesis once diet was started. Panda was to be placed back in however in the wrong position and difficult to be placed. Patient has been reassessed by speech therapy this morning who is recommending clear liquids with honey thicker. Remove Panda. Speech therapy following. Patient unable to tolerate current diet  and continuing to aspirate may likely benefit from a PEG tube.   #5 acute blood loss anemia Secondary to problem #3. Patient with no further signs of bleeding. Patient status post upper endoscopy with erosions seen at the GE junction. Hemoglobin is stable at 8.1.Transfusion threshold hemoglobin less than 7. Follow.  #6 sinus tachycardia Resolved. Continue Lopressor. Continue clonidine patch as panda tube is out.  #7 acute renal failure On admission patient was noted to have a creatinine of 1.43 with a BUN of 40 with a GFR in the 50s. Improved with hydration and renal function currently with a creatinine of 0.81 and a BUN of 7. Resolved.  #8 hypokalemia Repleted. Keep magnesium greater than 2.  #9 cerebral palsy/history of intellectual disability stable. Likely at baseline.  #10 history of seizure disorder Stable. Continue Vimpat.  #11 prophylaxis PPI for GI prophylaxis. SCDs for DVT prophylaxis.  #12 prognosis Patient with cerebral palsy who was admitted with acute hypoxia vent dependent respiratory failure secondaryn to HCAP and GI bleed. Patient status post extubation and with dysphagia requiring panda tube placement. Patient also noted to have a hiatal hernia. Patient also with a failure to thrive. Patient is a ward of the state. Will consult with palliative care for goals of care.     Code Status: Full Family Communication: Patient is a ward of the state. No family at bedside. Patient updated. Disposition Plan: Back to group home when medically stable.   Consultants:  PCCM admit 06/20/2015 Dr Jamison Neighbor  Gastroenterology: Dr. Adela Lank 06/20/2015  Procedures:  Upper endoscopy 06/20/2015-per Dr. Lynnell Dike changes at the GE junction. Hiatial hernia.?? Mallory-Weiss tear  Chest x-ray 06/22/2015, 06/30/2015, 07/03/2015.  Abdominal films 06/20/2015, 06/23/2015, 06/27/2015  Renal ultrasound 06/20/2015 SIGNIFICANT EVENTS: 9/13 Intubated & admitted Upper GIB 9/13  EGD w/ hiatal hernia but  no source of active bleeding 9/14 hyperchloremic acidosis  9/15 tachycardic, lopressor restarted. Continued on rx for pneumonia  9/16 WBC trending down, hgb stable, still w/ NAG metabolic acidosis.  9/18 hgb down w/o active bleeding. Patient more awake. 9/19 Extubated    Antibiotics: Primaxin 9/16 >> Unasyn 9/13>>9/16   HPI/Subjective: Patient laying in bed. Patient denies chest pain or shortness of breath. Panda tube in.   Objective: Filed Vitals:   07/03/15 0605  BP: 147/60  Pulse: 72  Temp: 99.2 F (37.3 C)  Resp: 20    Intake/Output Summary (Last 24 hours) at 07/03/15 1200 Last data filed at 07/03/15 0700  Gross per 24 hour  Intake 1630.42 ml  Output   1380 ml  Net 250.42 ml   Filed Weights   07/01/15 0317 07/02/15 0414 07/03/15 0354  Weight: 66.6 kg (146 lb 13.2 oz) 64.7 kg (142 lb 10.2 oz) 60.7 kg (133 lb 13.1 oz)    Exam:   General:  Laying in bed. NAD. panda  Cardiovascular: Regular rate rhythm no murmurs rubs or gallops.  Respiratory: Some coarse BS anterior lung fields.  Abdomen: Soft, nontender, nondistended, positive bowel sounds.  Musculoskeletal: No clubbing cyanosis or edema.   Data Reviewed: Basic Metabolic Panel:  Recent Labs Lab 06/29/15 0407 06/30/15 0429 07/01/15 0529 07/02/15 0418 07/03/15 0543  NA 142 140 140 140 140  K 4.1 4.2 4.1 4.3 4.3  CL 117* 115* 112* 116* 114*  CO2 21* 20* 19* 20* 19*  GLUCOSE 96 127* 79 86 101*  BUN 15 12 10 8 7   CREATININE 0.95 0.76 0.79 0.76 0.81  CALCIUM 8.2* 8.1* 8.2* 8.0* 8.0*  MG 2.0 1.7 2.0 1.7 1.8  PHOS 3.7 3.5 3.8 3.7 3.4   Liver Function Tests:  Recent Labs Lab 06/29/15 0407 06/30/15 0429 07/01/15 0529 07/02/15 0418 07/03/15 0543  ALBUMIN 2.3* 2.1* 2.3* 2.1* 2.1*   No results for input(s): LIPASE, AMYLASE in the last 168 hours. No results for input(s): AMMONIA in the last 168 hours. CBC:  Recent Labs Lab 06/26/15 1205 06/27/15 0404  06/29/15 0407 06/30/15 0429 07/02/15 0418 07/03/15 0543  WBC 4.4 5.3 7.8 7.9 7.7 15.6*  NEUTROABS 2.3 3.2  --   --   --   --   HGB 7.6* 8.3* 8.4* 8.2* 8.7* 8.1*  HCT 23.1* 24.7* 25.9* 24.9* 26.6* 23.6*  MCV 97.5 97.6 100.0 98.0 97.4 95.2  PLT 183 209 262 288 407* 452*   Cardiac Enzymes: No results for input(s): CKTOTAL, CKMB, CKMBINDEX, TROPONINI in the last 168 hours. BNP (last 3 results)  Recent Labs  12/03/14 1650  BNP 133.7*    ProBNP (last 3 results) No results for input(s): PROBNP in the last 8760 hours.  CBG:  Recent Labs Lab 07/02/15 0021 07/02/15 0406 07/02/15 0736 07/02/15 1136 07/02/15 1230  GLUCAP 87 79 75 62* 139*    No results found for this or any previous visit (from the past 240 hour(s)).   Studies: Dg Chest Port 1 View  07/03/2015   CLINICAL DATA:  64 year old male with leukocytosis. Subsequent encounter.  EXAM: PORTABLE CHEST 1 VIEW  COMPARISON:  06/30/2015.  FINDINGS: Change in appearance of asymmetric airspace disease greater on the right with bilateral pleural effusions suspected. This may represent asymmetric pulmonary edema. Given the leukocytosis, infectious infiltrate, particularly in the lung bases cannot be excluded.  Feeding tube is in place with the tip not imaged on the present exam.  Cardiomegaly.  No pneumothorax.  IMPRESSION: Change in  appearance of asymmetric airspace disease greater on the right with bilateral pleural effusions suspected. This may represent asymmetric pulmonary edema. Given the leukocytosis, infectious infiltrate, particularly in the lung bases cannot be excluded.   Electronically Signed   By: Lacy Duverney M.D.   On: 07/03/2015 09:20   Dg Abd Portable 1v  07/02/2015   CLINICAL DATA:  Reposition of feeding tube  EXAM: PORTABLE ABDOMEN - 1 VIEW  COMPARISON:  07/02/2015  FINDINGS: Feeding tube has been advanced, coiling in the distal stomach with the tip in the antrum of the stomach. Nonobstructive bowel gas pattern.   IMPRESSION: Feeding tube coils in the distal stomach with the tip likely in the antrum.   Electronically Signed   By: Charlett Nose M.D.   On: 07/02/2015 07:22   Dg Abd Portable 1v  07/02/2015   CLINICAL DATA:  Feeding tube placement.  EXAM: PORTABLE ABDOMEN - 1 VIEW  COMPARISON:  06/30/2015  FINDINGS: Feeding tube tip in the left upper quadrant consistent with location in the upper stomach. Residual contrast material throughout the colon. Degenerative changes in the spine.  IMPRESSION: Feeding tube tip projects over the upper stomach.   Electronically Signed   By: Burman Nieves M.D.   On: 07/02/2015 05:30   Dg Swallowing Func-speech Pathology  07/01/2015   Abe People, CCC-SLP     07/01/2015  1:39 PM  Objective Swallowing Evaluation:  (MBS)  Patient Details  Name: Colin Bradley MRN: [id number] Date of Birth: [birth year]  Today's Date: 07/01/2015 Time: SLP Start Time (ACUTE ONLY): 1300-SLP Stop Time (ACUTE  ONLY): 1315 SLP Time Calculation (min) (ACUTE ONLY): 15 min  Past Medical History:  Past Medical History  Diagnosis Date  . Seizures   . Cerebral palsy   . Hypertension   . Mental retardation   . Depression   . Nephrolithiasis   . BPH (benign prostatic hyperplasia)   . GI bleed 2009    coffee ground emesis, erosive esophagitis on EGD by Dr Juanda Chance  . Esophageal stricture 2009    not dilated during the EGD, biopsy benign:  no Barretts  . Depression with anxiety    Past Surgical History:  Past Surgical History  Procedure Laterality Date  . Basal cell carcinoma excision  2002    forehead  . Squamous cell carcinoma excision  2004    lip  . Bowen dz  2002    right cheek   . Esophagogastroduodenoscopy  06/14/2012    Procedure: ESOPHAGOGASTRODUODENOSCOPY (EGD);  Surgeon: Rachael Fee, MD;  Location: [facility] ENDOSCOPY;  Service: Endoscopy;   Laterality: N/A;  . Multiple extractions with alveoloplasty N/A 06/20/2014    Procedure: MULTIPLE EXTRACTIONS TEETH NUMBER TWO, TEN, TWELVE,  EIGHTEEN, TWENTY-ONE, TWENTY-TWO,  TWENTY-NINE, THIRTY-TWO;   Surgeon: Georgia Lopes, DDS;  Location: [facility] OR;  Service: Oral  Surgery;  Laterality: N/A;  . Esophagogastroduodenoscopy N/A 06/20/2015    Procedure: ESOPHAGOGASTRODUODENOSCOPY (EGD);  Surgeon: Ruffin Frederick, MD;  Location: [facility] ENDOSCOPY;  Service:  Gastroenterology;  Laterality: N/A;   HPI:  Other Pertinent Information: 64 yo male with h/o CP, intellectual  disability, coffee ground emesis 9/13 - required intubation.  Pt  extubated 06/26/15, swallow evaluation ordered.  Pt found to have  right mid and lower lobe infiltrate.  EGD showing HH and no  active bleed 9/13.    Pt resides at group home and is on a  regular/chopped diet prior to admission.  He is a ward of the  state.  No Data Recorded  Assessment / Plan / Recommendation CHL IP CLINICAL IMPRESSIONS 07/01/2015  Therapy Diagnosis Moderate oral phase dysphagia;Severe oral phase  dysphagia;Moderate pharyngeal phase dysphagia  Clinical Impression Moderate-severe oral dysphagi described as  poor labial closure and arrythymical lingual movements and  decreased oral cohesion characteristic of cerebral palsy.  Moderate sensorimotor pharyngeal dysphagia exhibiting decreased  sensation leading swallow initiation primaily at pyriform sinuses  with thinner viscocities. Moderate (sometimes max) vallecular and  pyriform sinus residue reduced with cue for second swallow.  Laryngeal penetration (silent with nectar) and eventual  aspiraiton. Pt unable to produce effective volitional cough.  Initiating a Dys 1 diet texture and honey thick liquids is not  without risk, however recommend these modified textures with ST  follow up for follow through of compensatory strategies: crush  meds, sit upright, small bites/sips (tsp size preferred) and ask  pt to swallow twice.         CHL IP TREATMENT RECOMMENDATION 12/05/2014  Treatment Recommendations Therapy as outlined in treatment plan  below     CHL IP DIET RECOMMENDATION 06/30/2015  SLP Diet  Recommendations (None)  Liquid Administration via (None)  Medication Administration Via alternative means  Compensations (None)  Postural Changes and/or Swallow Maneuvers (None)     CHL IP OTHER RECOMMENDATIONS 06/30/2015  Recommended Consults (None)  Oral Care Recommendations Oral care QID  Other Recommendations (None)     CHL IP FOLLOW UP RECOMMENDATIONS 06/29/2015  Follow up Recommendations (No Data)     CHL IP FREQUENCY AND DURATION 06/27/2015  Speech Therapy Frequency (ACUTE ONLY) min 2x/week  Treatment Duration 2 weeks     Pertinent Vitals/Pain none    SLP Swallow Goals No flowsheet data found.  No flowsheet data found.    CHL IP REASON FOR REFERRAL 07/01/2015  Reason for Referral Objectively evaluate swallowing function               No flowsheet data found.         Royce Macadamia 07/01/2015, 1:38 PM  Breck Coons Lonell Face.Ed CCC-SLP Pager [phone]      Scheduled Meds: . antiseptic oral rinse  7 mL Mouth Rinse q12n4p  . baclofen  5 mg Oral TID  . chlorhexidine  15 mL Mouth Rinse BID  . cloNIDine  0.2 mg Transdermal Weekly  . cycloSPORINE  1 drop Both Eyes BID  . escitalopram  20 mg Per Tube Daily  . free water  100 mL Per Tube 3 times per day  . furosemide  40 mg Intravenous Once  . guaiFENesin  5 mL Oral BID  . imipenem-cilastatin  500 mg Intravenous Q8H  . lacosamide (VIMPAT) IV  150 mg Intravenous Q12H  . metoprolol  7.5 mg Intravenous 4 times per day  . pantoprazole (PROTONIX) IV  40 mg Intravenous Q12H  . QUEtiapine  25 mg Per Tube QHS  . tamsulosin  0.4 mg Oral Daily   Continuous Infusions: . dextrose 5 % and 0.9% NaCl 1,000 mL with potassium chloride 40 mEq infusion 75 mL/hr at 07/03/15 0324  . feeding supplement (JEVITY 1.2 CAL) 1,000 mL (06/30/15 6213)    Principal Problem:   Acute respiratory failure with hypoxia Active Problems:   HCAP (healthcare-associated pneumonia)   Depression   Mental retardation   Infantile cerebral palsy   Anemia   Seizure disorder    Esophagitis   Acute upper GI bleed   GI bleed   Ventilator dependent   Acute renal failure  Nausea & vomiting   Respiratory failure   Pneumonitis   Dysphagia, pharyngoesophageal phase   Acute blood loss anemia    Time spent: 40 MINS    [facility] MD [facility] Pager [phone]. If 7PM-7AM, please contact night-coverage at [email or web address], password [facility] 07/03/2015, 12:00 PM  LOS: 13 days

## 2015-07-03 NOTE — Progress Notes (Signed)
Speech Language Pathology Treatment: Dysphagia  Patient Details Name: Colin Bradley MRN: [id number] DOB: [birth year] Today's Date: 07/03/2015 Time: 4098-1191 SLP Time Calculation (min) (ACUTE ONLY): 29 min  Assessment / Plan / Recommendation Clinical Impression  Clear meal tray at bedside required thickening by this SLP.  1 baseline cough noted with HOB elevation- Suspect possible cough on secretions.  Two coughs noted of 20 delayed swallows likely due to discoordinated swallow.  Pt with subtle cough and immediately stating "I'm ok" after.   Difficulties noted with only 10% of opportunities and pt consumed all of his ginger-ale and 4 ounces of tea - thickened to honey.  He did not desire to consume broth or cranberry juice beyond a few boluses. Pt is not conducting dry swallows as instructed however appears to be managing if fed at SLOW rate to compensate for delays.    Feeding pt is laborious due to his dysphagia and this may be a chronic issue for him, however pt clearly expresses desire to eat = "I want bacon and eggs".  SLP to follow closely for tolerance and readiness for dietary advancement.     Spoke to RN who had pt over the weekend (on 5E) and she states she was informed by the NT that pt had projectile vomited during meal.    Recommend continue diet with strict precautions.    HPI Other Pertinent Information: 64 yo male with h/o CP, intellectual disability, coffee ground emesis 9/13 - required intubation.  Pt extubated 06/26/15, swallow evaluation ordered.  Pt found to have right mid and lower lobe infiltrate.  EGD showing HH and no active bleed 9/13.    Pt resides at group home and is on a regular/chopped diet prior to admission.  He is a ward of the state.   Pertinent Vitals Pain Assessment: No/denies pain  SLP Plan  Continue with current plan of care    Recommendations Diet recommendations: Honey-thick liquid (clears) Liquids provided via: Teaspoon Medication Administration:  Crushed with puree Supervision: Full supervision/cueing for compensatory strategies Compensations: Slow rate;Small sips/bites;Other (Comment) (delayed swallow, allow time for pt to trigger swallow) Postural Changes and/or Swallow Maneuvers: Seated upright 90 degrees;Upright 30-60 min after meal              Follow up Recommendations: Other (comment) (tbd) Plan: Continue with current plan of care    GO     Donavan Burnet, MS [facility] SLP [phone]

## 2015-07-03 NOTE — Care Management Important Message (Signed)
Important Message  Patient Details IM Letter given to Cookie/Case Manager to present to PatientImportant Message  Patient Details  Name: Colin Bradley MRN: [id number] Date of Birth: June 28, 1951   Medicare Important Message Given:  Yes-third notification given    Haskell Flirt 07/03/2015, 12:13 PM Name: Colin Bradley MRN: [id number] Date of Birth: [birth year]   Medicare Important Message Given:  Yes-third notification given    Haskell Flirt 07/03/2015, 12:12 PM

## 2015-07-03 NOTE — Progress Notes (Addendum)
Speech Language Pathology Treatment: Dysphagia  Patient Details Name: Colin Bradley MRN: [id number] DOB: 1951-08-18 Today's Date: 07/03/2015 Time: 1020-1030 SLP Time Calculation (min) (ACUTE ONLY): 10 min  Assessment / Plan / Recommendation Clinical Impression  Pt sitting upright in bed stating he desires to eat lunch.  RN reports pt's tube is coiled - guide wire remains in place.  Pt observed with honey thick liquids via tsp with good tolerance and clear voice throughout.  Pt requests intake stating "I want lunch".  Would recommend to consider removing tube - given it is coiled and initiate clears honey thick in small amounts to assess for tolerance.  Informed RN and nurse tech of recommendation.  SLP to follow up.     HPI Other Pertinent Information: 65 yo male with h/o CP, intellectual disability, coffee ground emesis 9/13 - required intubation.  Pt extubated 06/26/15, swallow evaluation ordered.  Pt found to have right mid and lower lobe infiltrate.  EGD showing HH and no active bleed 9/13.    Pt resides at group home and is on a regular/chopped diet prior to admission.  He is a ward of the state.   Pertinent Vitals Pain Assessment: No/denies pain  SLP Plan  Continue with current plan of care    Recommendations Diet recommendations: Honey-thick liquid (? consider clear liquids = Honey thick via tsp only ) Medication Administration: Whole meds with puree Supervision: Full supervision/cueing for compensatory strategies Compensations: Slow rate;Small sips/bites;Multiple dry swallows after each bite/sip Postural Changes and/or Swallow Maneuvers: Seated upright 90 degrees;Upright 30-60 min after meal              Oral Care Recommendations: Oral care QID Plan: Continue with current plan of care    GO    Donavan Burnet, MS [facility] SLP [phone]  Addendum:  Dr Janee Morn, requested SLP observe pt with meal, requested RN to page this SLP when meal ordered.

## 2015-07-03 NOTE — Consult Note (Signed)
Consultation Note Date: 07/03/2015   Patient Name: Colin Bradley  DOB: 1951-05-19  MRN: [id number]  Age / Sex: 64 y.o., male   PCP: Adrian Prince, MD Referring Physician: Rodolph Bong, MD  Reason for Consultation: Establishing goals of care  Palliative Care Assessment and Plan Summary of Established Goals of Care and Medical Treatment Preferences    Palliative Care Discussion Held Today:    This NP Lorinda Creed reviewed medical records, received report from team, assessed the patient and then spoke by telephone with his DSS guardian Lissa Merlin  to discuss diagnosis, prognosis, GOC, EOL wishes disposition and options.  A discussion was had regarding his current medical situation,  Questions and concerns were addressed.  A discussion was had today regarding the importance of documentation of advanced directives.  Concepts specific to code status, artifical feeding and hydration, continued IV antibiotics and rehospitalization was had.  The difference between a aggressive medical intervention path  and a palliative comfort care path for this patient at this time was had.  Values and goals of care important to patient and family were attempted to be elicited.  Concept of Hospice and Palliative Care were discussed.  Guardian to fax necessary paperwork to be completed by attending physician to facilitate process to secure advanced directives.  Timely action stressed.  Natural trajectory and expectations at EOL were discussed.  Questions and concerns addressed.  Colin Bradley to call with questions or concerns.  PMT will continue to support holistically.  MOST form was discussed also   Primary Decision Maker: Patient is a ward of the state, DSS is both guardian/ medical  decision maker  Goals of Care/Code Status/Advance Care Planning:  Patient remains a full code and guardian is open to all offered and available medical interventions to prolong life   Psycho-social/Spiritual:    Support System: ward of the state, lives at [facility], has father who lives in [place]   Desire for further Chaplaincy support:yes  Prognosis: Unable to determine  Discharge Planning:  Pending outcomes        Chief Complaint:  Emesis, coffee ground  History of Present Illness:   Patient is a 64 year old male with cerebral palsy brought in from [facility] home for hematemesis.  Patient known to [place] for a history of GERD / erosive esophagitis. He had several episodes of coffee ground emesis in ED during the night.  Patient likely aspirated with progressive opacity in right lung on xray.    Patient  required intubation and is now successfully extubated, however there is concern for significant dysphagia, inability to support himself nutritionally, high risk of decompensation and over all failure to thrive.  Advanced directive decisions are of utmost importance in order to enhance patient centered care   Primary Diagnoses  Present on Admission:  . Acute upper GI bleed . GI bleed . Infantile cerebral palsy . Esophagitis . Nausea & vomiting . Respiratory failure . Pneumonitis . Acute respiratory failure with hypoxia . Anemia . Depression . HCAP (healthcare-associated pneumonia) . Acute renal failure  Palliative Review of Systems:    -unable to illict   I have reviewed the medical record, interviewed the patient and family, and examined the patient. The following aspects are pertinent.  Past Medical History  Diagnosis Date  . Seizures   . Cerebral palsy   . Hypertension   . Mental retardation   . Depression   . Nephrolithiasis   . BPH (benign prostatic hyperplasia)   . GI bleed  2009    coffee ground emesis, erosive esophagitis on EGD by Dr Juanda Chance  . Esophageal stricture 2009    not dilated during the EGD, biopsy benign:  no Barretts  . Depression with anxiety    Social History   Social History  . Marital Status: Single    Spouse Name: N/A  . Number of  Children: N/A  . Years of Education: N/A   Social History Main Topics  . Smoking status: Never Smoker   . Smokeless tobacco: Never Used  . Alcohol Use: No  . Drug Use: No  . Sexual Activity: No   Other Topics Concern  . None   Social History Narrative   History reviewed. No pertinent family history. Scheduled Meds: . antiseptic oral rinse  7 mL Mouth Rinse q12n4p  . baclofen  5 mg Oral TID  . chlorhexidine  15 mL Mouth Rinse BID  . cloNIDine  0.2 mg Transdermal Weekly  . cycloSPORINE  1 drop Both Eyes BID  . escitalopram  20 mg Per Tube Daily  . free water  100 mL Per Tube 3 times per day  . guaiFENesin  5 mL Oral BID  . imipenem-cilastatin  500 mg Intravenous Q8H  . lacosamide (VIMPAT) IV  150 mg Intravenous Q12H  . metoprolol  7.5 mg Intravenous 4 times per day  . pantoprazole (PROTONIX) IV  40 mg Intravenous Q12H  . QUEtiapine  25 mg Per Tube QHS  . tamsulosin  0.4 mg Oral Daily   Continuous Infusions: . dextrose 5 % and 0.9% NaCl 1,000 mL with potassium chloride 40 mEq infusion 50 mL/hr at 07/03/15 1649  . feeding supplement (JEVITY 1.2 CAL) 1,000 mL (06/30/15 0653)   PRN Meds:.glycopyrrolate, ondansetron (ZOFRAN) IV, RESOURCE THICKENUP CLEAR Medications Prior to Admission:  Prior to Admission medications   Medication Sig Start Date End Date Taking? Authorizing Provider  albuterol (PROVENTIL) (2.5 MG/3ML) 0.083% nebulizer solution Take 3 mLs (2.5 mg total) by nebulization every 6 (six) hours as needed for wheezing or shortness of breath. 11/07/14  Yes Richarda Overlie, MD  baclofen (LIORESAL) 10 MG tablet Take 5 mg by mouth 3 (three) times daily.   Yes Historical Provider, MD  cloNIDine (CATAPRES) 0.2 MG tablet Take 1 tablet (0.2 mg total) by mouth 2 (two) times daily. 11/07/14  Yes Richarda Overlie, MD  cycloSPORINE (RESTASIS) 0.05 % ophthalmic emulsion Place 1 drop into both eyes 2 (two) times daily.   Yes Historical Provider, MD  dutasteride (AVODART) 0.5 MG capsule Take 0.5 mg  by mouth daily.   Yes Historical Provider, MD  guaiFENesin (ROBITUSSIN) 100 MG/5ML SOLN Take 10 mLs by mouth 3 (three) times daily. For 10 days 10/27/14  Yes Historical Provider, MD  Lacosamide (VIMPAT) 150 MG TABS Take 150 mg by mouth 2 (two) times daily.   Yes Historical Provider, MD  lisinopril (PRINIVIL,ZESTRIL) 30 MG tablet Take 1 tablet (30 mg total) by mouth daily with breakfast. Patient taking differently: Take 30 mg by mouth daily.  11/07/14  Yes Richarda Overlie, MD  LORazepam (ATIVAN) 0.5 MG tablet Take 0.5 mg by mouth every 8 (eight) hours.   Yes Historical Provider, MD  metoprolol (LOPRESSOR) 50 MG tablet Take 1 tablet (50 mg total) by mouth 2 (two) times daily. 12/07/14  Yes Ripudeep Jenna Luo, MD  Multiple Vitamin (DAILY VITE PO) Take 1 tablet by mouth daily with breakfast.    Yes Historical Provider, MD  ondansetron (ZOFRAN) 4 MG tablet Take 4 mg by mouth 3 (  three) times daily as needed for nausea or vomiting.   Yes Historical Provider, MD  potassium chloride 20 MEQ TBCR Take 20 mEq by mouth daily. 11/07/14  Yes Richarda Overlie, MD  prazosin (MINIPRESS) 2 MG capsule Take 2 mg by mouth at bedtime.    Yes Historical Provider, MD  promethazine (PHENERGAN) 12.5 MG tablet Take 12.5 mg by mouth every 12 (twelve) hours as needed for nausea or vomiting.   Yes Historical Provider, MD  QUEtiapine (SEROQUEL) 25 MG tablet Take 25 mg by mouth at bedtime.   Yes Historical Provider, MD  ranitidine (ZANTAC) 150 MG tablet Take 150 mg by mouth 2 (two) times daily.   Yes Historical Provider, MD  sucralfate (CARAFATE) 1 GM/10ML suspension Take 1 g by mouth 4 (four) times daily -  with meals and at bedtime.    Yes Historical Provider, MD  tamsulosin (FLOMAX) 0.4 MG CAPS capsule Take 0.4 mg by mouth daily.   Yes Historical Provider, MD  amoxicillin-clavulanate (AUGMENTIN) 875-125 MG per tablet Take 1 tablet by mouth 2 (two) times daily. X 4 more days Patient not taking: Reported on 06/20/2015 12/07/14   Ripudeep Jenna Luo, MD    Dutasteride-Tamsulosin HCl (JALYN) 0.5-0.4 MG CAPS Take 1 tablet by mouth daily with breakfast.     Historical Provider, MD  escitalopram (LEXAPRO) 20 MG tablet Take 20 mg by mouth daily with breakfast.     Historical Provider, MD  food thickener (THICK IT) POWD Take 1 g by mouth 2 (two) times daily.    Historical Provider, MD  fosfomycin (MONUROL) 3 G PACK Take 3 g by mouth once. Please give 1 tab on 12/09/2014 Patient not taking: Reported on 06/20/2015 12/09/14   Ripudeep K Rai, MD  LORazepam (ATIVAN) 1 MG tablet Take 1 tablet (1 mg total) by mouth every 8 (eight) hours as needed for anxiety. Patient not taking: Reported on 06/20/2015 12/07/14   Ripudeep Jenna Luo, MD  Maltodextrin-Xanthan Gum (RESOURCE THICKENUP CLEAR) POWD 2 times a day with meals Patient not taking: Reported on 12/02/2014 11/07/14   Richarda Overlie, MD  saccharomyces boulardii (FLORASTOR) 250 MG capsule Take 1 capsule (250 mg total) by mouth 2 (two) times daily. While on antibiotics Patient not taking: Reported on 06/20/2015 12/07/14   Ripudeep Jenna Luo, MD   No Known Allergies CBC:    Component Value Date/Time   WBC 15.6* 07/03/2015 0543   HGB 8.1* 07/03/2015 0543   HCT 23.6* 07/03/2015 0543   PLT 452* 07/03/2015 0543   MCV 95.2 07/03/2015 0543   NEUTROABS 3.2 06/27/2015 0404   LYMPHSABS 1.2 06/27/2015 0404   MONOABS 0.5 06/27/2015 0404   EOSABS 0.4 06/27/2015 0404   BASOSABS 0.0 06/27/2015 0404   Comprehensive Metabolic Panel:    Component Value Date/Time   NA 140 07/03/2015 0543   K 4.3 07/03/2015 0543   CL 114* 07/03/2015 0543   CO2 19* 07/03/2015 0543   BUN 7 07/03/2015 0543   CREATININE 0.81 07/03/2015 0543   GLUCOSE 101* 07/03/2015 0543   CALCIUM 8.0* 07/03/2015 0543   AST 28 06/20/2015 0115   ALT 26 06/20/2015 0115   ALKPHOS 96 06/20/2015 0115   BILITOT 0.4 06/20/2015 0115   PROT 7.3 06/20/2015 0115   ALBUMIN 2.1* 07/03/2015 0543    Physical Exam:  Vital Signs: BP 152/72 mmHg  Pulse 92  Temp(Src) 97.6 F  (36.4 C) (Oral)  Resp 20  Ht  (1.702 m)  Wt 60.7 kg (133 lb 13.1 oz)  BMI 20.95 kg/m2  SpO2 99% SpO2: SpO2: 99 % O2 Device: O2 Device: Nasal Cannula O2 Flow Rate: O2 Flow Rate (L/min): 2 L/min Intake/output summary:  Intake/Output Summary (Last 24 hours) at 07/03/15 1938 Last data filed at 07/03/15 1700  Gross per 24 hour  Intake 980.42 ml  Output   2755 ml  Net -1774.58 ml   LBM: Last BM Date: 06/30/15 Baseline Weight: Weight: 52.2 kg (115 lb 1.3 oz) Most recent weight: Weight: 60.7 kg (133 lb 13.1 oz)  Exam Findings:   General: chronically ill appearing, NAD HEENT: moist buccalmembranes CVS: RRR Resp: decreased in bases Skin: warm and dry Neuro:  Follows simple commands           Palliative Performance Scale: 30 % at best                Additional Data Reviewed: Recent Labs     07/02/15  0418  07/03/15  0543  WBC  7.7  15.6*  HGB  8.7*  8.1*  PLT  407*  452*  NA  140  140  BUN  8  7  CREATININE  0.76  0.81     Time In: 1100 Time Out: 1215 Time Total: 75 min  Greater than 50%  of this time was spent counseling and coordinating care related to the above assessment and plan.  Discussed with Dr Janee Morn  Signed by: Lorinda Creed, NP  Canary Brim, NP  07/03/2015, 7:38 PM  Please contact Palliative Medicine Team phone at [phone] for questions and concerns.   See AMION for contact information

## 2015-07-04 ENCOUNTER — Inpatient Hospital Stay (HOSPITAL_COMMUNITY): Payer: Medicare Other

## 2015-07-04 ENCOUNTER — Inpatient Hospital Stay (HOSPITAL_COMMUNITY): Payer: Medicare Other | Admitting: Anesthesiology

## 2015-07-04 DIAGNOSIS — I469 Cardiac arrest, cause unspecified: Secondary | ICD-10-CM

## 2015-07-04 LAB — BLOOD GAS, ARTERIAL
ACID-BASE DEFICIT: 1.5 mmol/L (ref 0.0–2.0)
BICARBONATE: 21.8 meq/L (ref 20.0–24.0)
Drawn by: 257701
FIO2: 1
LHR: 18 {breaths}/min
O2 SAT: 99.9 %
PATIENT TEMPERATURE: 98.6
PCO2 ART: 33.5 mmHg — AB (ref 35.0–45.0)
PEEP: 5 cmH2O
PH ART: 7.428 (ref 7.350–7.450)
TCO2: 19.5 mmol/L (ref 0–100)
VT: 530 mL
pO2, Arterial: 378 mmHg — ABNORMAL HIGH (ref 80.0–100.0)

## 2015-07-04 LAB — CBC WITH DIFFERENTIAL/PLATELET
BASOS PCT: 1 %
BASOS PCT: 1 %
Basophils Absolute: 0 10*3/uL (ref 0.0–0.1)
Basophils Absolute: 0.1 10*3/uL (ref 0.0–0.1)
EOS ABS: 0.4 10*3/uL (ref 0.0–0.7)
EOS PCT: 4 %
Eosinophils Absolute: 0.4 10*3/uL (ref 0.0–0.7)
Eosinophils Relative: 6 %
HCT: 31.3 % — ABNORMAL LOW (ref 39.0–52.0)
HEMATOCRIT: 26.3 % — AB (ref 39.0–52.0)
HEMOGLOBIN: 8.7 g/dL — AB (ref 13.0–17.0)
HEMOGLOBIN: 10.3 g/dL — AB (ref 13.0–17.0)
LYMPHS ABS: 2.1 10*3/uL (ref 0.7–4.0)
LYMPHS PCT: 29 %
Lymphocytes Relative: 23 %
Lymphs Abs: 2.2 10*3/uL (ref 0.7–4.0)
MCH: 31.9 pg (ref 26.0–34.0)
MCH: 31.8 pg (ref 26.0–34.0)
MCHC: 33.1 g/dL (ref 30.0–36.0)
MCHC: 32.9 g/dL (ref 30.0–36.0)
MCV: 96.3 fL (ref 78.0–100.0)
MCV: 96.6 fL (ref 78.0–100.0)
MONO ABS: 0.6 10*3/uL (ref 0.1–1.0)
MONOS PCT: 8 %
MONOS PCT: 8 %
Monocytes Absolute: 0.8 10*3/uL (ref 0.1–1.0)
NEUTROS ABS: 4.2 10*3/uL (ref 1.7–7.7)
NEUTROS PCT: 64 %
Neutro Abs: 6.3 10*3/uL (ref 1.7–7.7)
Neutrophils Relative %: 56 %
PLATELETS: 698 10*3/uL — AB (ref 150–400)
Platelets: 528 10*3/uL — ABNORMAL HIGH (ref 150–400)
RBC: 2.73 MIL/uL — ABNORMAL LOW (ref 4.22–5.81)
RBC: 3.24 MIL/uL — ABNORMAL LOW (ref 4.22–5.81)
RDW: 13.6 % (ref 11.5–15.5)
RDW: 13.6 % (ref 11.5–15.5)
WBC: 7.4 10*3/uL (ref 4.0–10.5)
WBC: 9.7 10*3/uL (ref 4.0–10.5)

## 2015-07-04 LAB — RENAL FUNCTION PANEL
Albumin: 2.2 g/dL — ABNORMAL LOW (ref 3.5–5.0)
Anion gap: 7 (ref 5–15)
BUN: 7 mg/dL (ref 6–20)
CALCIUM: 8.3 mg/dL — AB (ref 8.9–10.3)
CHLORIDE: 109 mmol/L (ref 101–111)
CO2: 22 mmol/L (ref 22–32)
Creatinine, Ser: 0.89 mg/dL (ref 0.61–1.24)
GFR calc Af Amer: >60 mL/min (ref >60)
GFR calc non Af Amer: >60 mL/min (ref >60)
GLUCOSE: 88 mg/dL (ref 65–99)
Phosphorus: 4.3 mg/dL (ref 2.5–4.6)
Potassium: 4.3 mmol/L (ref 3.5–5.1)
SODIUM: 138 mmol/L (ref 135–145)

## 2015-07-04 LAB — TROPONIN I
Troponin I: <0.03 ng/mL (ref <0.031)
Troponin I: 0.04 ng/mL — ABNORMAL HIGH (ref <0.031)
Troponin I: <0.03 ng/mL (ref <0.031)

## 2015-07-04 LAB — COMPREHENSIVE METABOLIC PANEL
ALBUMIN: 2.7 g/dL — AB (ref 3.5–5.0)
ALK PHOS: 89 U/L (ref 38–126)
ALT: 10 U/L — ABNORMAL LOW (ref 17–63)
ANION GAP: 11 (ref 5–15)
AST: 27 U/L (ref 15–41)
BUN: 8 mg/dL (ref 6–20)
CHLORIDE: 106 mmol/L (ref 101–111)
CO2: 20 mmol/L — AB (ref 22–32)
Calcium: 8.6 mg/dL — ABNORMAL LOW (ref 8.9–10.3)
Creatinine, Ser: 0.94 mg/dL (ref 0.61–1.24)
GFR calc non Af Amer: >60 mL/min (ref >60)
GLUCOSE: 106 mg/dL — AB (ref 65–99)
POTASSIUM: 4.6 mmol/L (ref 3.5–5.1)
SODIUM: 137 mmol/L (ref 135–145)
Total Bilirubin: 0.4 mg/dL (ref 0.3–1.2)
Total Protein: 6.8 g/dL (ref 6.5–8.1)

## 2015-07-04 LAB — GLUCOSE, CAPILLARY
GLUCOSE-CAPILLARY: 72 mg/dL (ref 65–99)
GLUCOSE-CAPILLARY: 62 mg/dL — AB (ref 65–99)
Glucose-Capillary: 75 mg/dL (ref 65–99)
Glucose-Capillary: 82 mg/dL (ref 65–99)
Glucose-Capillary: 106 mg/dL — ABNORMAL HIGH (ref 65–99)

## 2015-07-04 LAB — MAGNESIUM
Magnesium: 1.6 mg/dL — ABNORMAL LOW (ref 1.7–2.4)
Magnesium: 2.6 mg/dL — ABNORMAL HIGH (ref 1.7–2.4)

## 2015-07-04 MED ORDER — FENTANYL CITRATE (PF) 100 MCG/2ML IJ SOLN
INTRAMUSCULAR | Status: AC
  Filled 2015-07-04: qty 2

## 2015-07-04 MED ORDER — DEXTROSE 50 % IV SOLN
25.0000 mL | Freq: Once | INTRAVENOUS | Status: AC
  Administered 2015-07-04: 25 mL via INTRAVENOUS

## 2015-07-04 MED ORDER — SENNOSIDES 8.8 MG/5ML PO SYRP
5.0000 mL | ORAL_SOLUTION | Freq: Two times a day (BID) | ORAL | Status: DC | PRN
  Filled 2015-07-04: qty 5

## 2015-07-04 MED ORDER — PROPOFOL 10 MG/ML IV BOLUS: INTRAVENOUS | Status: DC | PRN

## 2015-07-04 MED ORDER — DEXTROSE 50 % IV SOLN
INTRAVENOUS | Status: AC
  Administered 2015-07-04: 25 mL via INTRAVENOUS
  Filled 2015-07-04: qty 50

## 2015-07-04 MED ORDER — PROPOFOL 10 MG/ML IV BOLUS
INTRAVENOUS | Status: DC | PRN
  Administered 2015-07-04: 50 mg via INTRAVENOUS

## 2015-07-04 MED ORDER — FENTANYL CITRATE (PF) 100 MCG/2ML IJ SOLN
50.0000 ug | Freq: Once | INTRAMUSCULAR | Status: AC
  Administered 2015-07-04: 50 ug via INTRAVENOUS

## 2015-07-04 MED ORDER — FENTANYL CITRATE (PF) 100 MCG/2ML IJ SOLN: 50.0000 ug | INTRAMUSCULAR | Status: DC | PRN

## 2015-07-04 MED ORDER — SODIUM CHLORIDE 0.9 % IV SOLN
25.0000 ug/h | INTRAVENOUS | Status: DC
  Administered 2015-07-04: 200 ug/h via INTRAVENOUS
  Administered 2015-07-04: 100 ug/h via INTRAVENOUS
  Administered 2015-07-05: 200 ug/h via INTRAVENOUS
  Administered 2015-07-06: 75 ug/h via INTRAVENOUS
  Administered 2015-07-06: 25 ug/h via INTRAVENOUS
  Filled 2015-07-04 (×3): qty 50

## 2015-07-04 MED ORDER — MIDAZOLAM HCL 2 MG/2ML IJ SOLN
1.0000 mg | INTRAMUSCULAR | Status: DC | PRN
  Administered 2015-07-04 (×2): 1 mg via INTRAVENOUS
  Filled 2015-07-04 (×2): qty 2

## 2015-07-04 MED ORDER — FENTANYL BOLUS VIA INFUSION
25.0000 ug | INTRAVENOUS | Status: DC | PRN
  Administered 2015-07-06: 25 ug via INTRAVENOUS
  Filled 2015-07-04: qty 25

## 2015-07-04 MED ORDER — MAGNESIUM SULFATE 4 GM/100ML IV SOLN
4.0000 g | Freq: Once | INTRAVENOUS | Status: AC
  Administered 2015-07-04: 4 g via INTRAVENOUS
  Filled 2015-07-04: qty 100

## 2015-07-04 MED ORDER — ETOMIDATE 2 MG/ML IV SOLN
INTRAVENOUS | Status: DC | PRN
  Administered 2015-07-04: 8 mg via INTRAVENOUS

## 2015-07-04 MED ORDER — MIDAZOLAM HCL 2 MG/2ML IJ SOLN
1.0000 mg | INTRAMUSCULAR | Status: AC | PRN
  Administered 2015-07-04 (×3): 1 mg via INTRAVENOUS
  Filled 2015-07-04: qty 2

## 2015-07-04 MED ORDER — FUROSEMIDE 10 MG/ML IJ SOLN
40.0000 mg | Freq: Once | INTRAMUSCULAR | Status: AC
  Administered 2015-07-04: 40 mg via INTRAVENOUS
  Filled 2015-07-04: qty 4

## 2015-07-04 MED ORDER — MIDAZOLAM HCL 2 MG/2ML IJ SOLN
INTRAMUSCULAR | Status: AC
  Filled 2015-07-04: qty 2

## 2015-07-04 MED ORDER — BISACODYL 10 MG RE SUPP: 10.0000 mg | Freq: Every day | RECTAL | Status: DC | PRN

## 2015-07-04 MED ORDER — SUCCINYLCHOLINE CHLORIDE 20 MG/ML IJ SOLN
INTRAMUSCULAR | Status: DC | PRN
  Administered 2015-07-04: 100 mg via INTRAVENOUS

## 2015-07-04 MED ORDER — FENTANYL CITRATE (PF) 100 MCG/2ML IJ SOLN
50.0000 ug | INTRAMUSCULAR | Status: AC | PRN
  Administered 2015-07-04 (×3): 50 ug via INTRAVENOUS
  Filled 2015-07-04: qty 2

## 2015-07-04 MED FILL — Medication: Qty: 1 | Status: AC

## 2015-07-04 NOTE — Progress Notes (Addendum)
SLP Cancellation Note  Patient Details Name: Colin Bradley MRN: [id number] DOB: [birth year]   Cancelled treatment:       Reason Eval/Treat Not Completed: Medical issues which prohibited therapy (pt coded and now intubated)  Will sign off, please reorder if pt becomes appropriate.    SLP spoke to nurse tech (after event) who fed pt breakfast (small amount of thick coffee, applesauce, yellow jello and few bites of red jello) and she reports pt did not cough when consuming breakfast, tolerating well.  Pt was fed at approximately 8 am and respiratory event occurred at approx 10 am.  SLP questions if pt could have regurgitated and aspirated due to delay in episode.  Also question impact of hiatal hernia on pt's event.  Please reorder when desire.  Thanks.    Donavan Burnet, MS [facility] SLP [phone]

## 2015-07-04 NOTE — Progress Notes (Signed)
Transferred to ICU

## 2015-07-04 NOTE — Progress Notes (Signed)
[facility] PROGRESS NOTE  OUSMANE SEEMAN TRZ:[id number] DOB: [birth year] DOA: 06/20/2015 PCP: Sheela Stack, MD  Assessment/Plan: #1 cardiopulmonary arrest Patient was noted to have a brief episode of asystole and underwent CPR for about a minute with agonal breathing and minimally responsive and subsequently intubated. Patient to be transferred to the ICU. Check a be met check a CBC check a magnesium level check a EKG check a chest x-ray check a 2-D echo check cardiac enzymes every 6 hours 3. Consult with critical care medicine for further evaluation and management.  #2 acute hypoxic vent dependent respiratory failure Likely secondary to HCAP-ESBL Klebsiella per tracheal aspirate on initial admission. Patient status post extubation 06/26/2015. Patient due to cardiopulmonary arrest reintubated today 07/04/2015 and transferred to the ICU unit. Repeat chest x-ray has been ordered. Be met, CBC, magnesium, troponins, EKG, 2-D echo have been reordered. Continue empiric antibiotics of Primaxin, nebulizer treatments, oxygen, mucolytics and chest PT. Will give a dose of IV Lasix 1. Critical care has been consulted. Follow.  #3 HCAP-ESBL Klebsiella/aspiration pneumonia Patient had lots of secretions and ongoing emesis that has since improved. Rhonchi and coarse breath sounds on examination had improved. Repeat chest x-ray with change in appearance of asymmetric airspace disease greater on the right with bilateral pleural effusions suspected which may represent asymmetric pulmonary edema. Patient noted to have a leukocytosis and concern for possible aspiration. Continue oxygen, nebulizers, IV Primaxin, mucolytics and chest PT. patient given IV Lasix yesterday with output of 2.7 L. Gave another dose of IV Lasix today. Follow.  #4 upper GI bleed Status post upper endoscopy with erosions at the GE junction may be the likely source, but no active bleeding noted. Mallory-Weiss tear might be a  consideration. No further bleeding noted. Hemoglobin has remained stable. Continue PPI. GI has signed off.  #5 dysphagia Patient failed swallow evaluation initially. Patient had a small bore NG tube in place however this was accidentally dislodged. Patient was reassessed by speech therapy today initially placed on a diet however per nursing patient had some significant emesis once diet was started. Panda was to be placed back in however in the wrong position and difficult to be placed. Patient has been reassessed by speech therapy 07/03/2015 and recommended clear liquids with honey thicker which patient tolerated. Panda tube was removed. Patient currently reintubated secondary to cardiopulmonary arrest. Reassess dysphagia once patient is extubated.  #6 acute blood loss anemia Secondary to problem #3. Patient with no further signs of bleeding. Patient status post upper endoscopy with erosions seen at the GE junction. Hemoglobin is stable at 8.7.Transfusion threshold hemoglobin less than 7. Follow.  #7 sinus tachycardia Resolved. Continue Lopressor. Continue clonidine patch as panda tube is out.  #8 acute renal failure On admission patient was noted to have a creatinine of 1.43 with a BUN of 40 with a GFR in the 50s. Improved with hydration and renal function currently with a creatinine of 0.89 and a BUN of 7. Resolved.  #9 hypokalemia Repleted. Keep magnesium greater than 2.  #10 cerebral palsy/history of intellectual disability stable.   #11 history of seizure disorder Stable. Continue Vimpat.  #12 prophylaxis PPI for GI prophylaxis. SCDs for DVT prophylaxis.  #13 prognosis Patient with cerebral palsy who was admitted with acute hypoxia vent dependent respiratory failure secondaryn to HCAP and GI bleed. Patient status post extubation and with dysphagia requiring panda tube placement. Patient also noted to have a hiatal hernia. Patient also with a failure to thrive. Patient is a ward  of the  state. Palliative care was consulted for goals of care. Patient currently on full code. Patient went into cardiopulmonary arrest briefly on 07/04/2015 and has been intubated and transferred back to the ICU.     Code Status: Full Family Communication: Patient is a ward of the state. No family at bedside. Patient updated. Disposition Plan: Transfer to ICU   Consultants:  PCCM admit 06/20/2015 Dr Ashok Cordia  Gastroenterology: Dr. Havery Moros 06/20/2015  Palliative Care: Wadie Lessen 07/03/2015  Procedures:  Upper endoscopy 06/20/2015-per Dr. Marella Bile changes at the GE junction. Hiatial hernia.?? Mallory-Weiss tear  Chest x-ray 06/22/2015, 06/30/2015, 07/03/2015.  Abdominal films 06/20/2015, 06/23/2015, 06/27/2015  Renal ultrasound 06/20/2015 SIGNIFICANT EVENTS: 9/13 Intubated & admitted Upper GIB 9/13 EGD w/ hiatal hernia but no source of active bleeding 9/14 hyperchloremic acidosis  9/15 tachycardic, lopressor restarted. Continued on rx for pneumonia  9/16 WBC trending down, hgb stable, still w/ NAG metabolic acidosis.  9/18 hgb down w/o active bleeding. Patient more awake. 9/19 Extubated  9/27 cardiopulmonary arrest 9/27/ Intubated   Antibiotics: Primaxin 9/16 >> Unasyn 9/13>>9/16   HPI/Subjective: Was called patient was coding. Patient apparently with asystole and brief arrest with agonal breathing and slumped over. Patient intubated and back in NSR.  Objective: Filed Vitals:   07/04/15 0328  BP: 155/70  Pulse: 63  Temp: 97.9 F (36.6 C)  Resp: 20    Intake/Output Summary (Last 24 hours) at 07/04/15 1018 Last data filed at 07/04/15 0821  Gross per 24 hour  Intake 1925.41 ml  Output   2725 ml  Net -799.59 ml   Filed Weights   07/02/15 0414 07/03/15 0354 07/04/15 0328  Weight: 64.7 kg (142 lb 10.2 oz) 60.7 kg (133 lb 13.1 oz) 61.1 kg (134 lb 11.2 oz)    Exam:   General:  Laying in bed. NAD. panda  Cardiovascular: Regular rate rhythm  no murmurs rubs or gallops.  Respiratory: Some coarse BS anterior lung fields.  Abdomen: Soft, nontender, nondistended, positive bowel sounds.  Musculoskeletal: No clubbing cyanosis or edema.   Data Reviewed: Basic Metabolic Panel:  Recent Labs Lab 06/30/15 0429 07/01/15 0529 07/02/15 0418 07/03/15 0543 07/04/15 0450 07/04/15 0508  NA 140 140 140 140 138  --   K 4.2 4.1 4.3 4.3 4.3  --   CL 115* 112* 116* 114* 109  --   CO2 20* 19* 20* 19* 22  --   GLUCOSE 127* 79 86 101* 88  --   BUN $Re'12 10 8 7 7  'jXr$ --   CREATININE 0.76 0.79 0.76 0.81 0.89  --   CALCIUM 8.1* 8.2* 8.0* 8.0* 8.3*  --   MG 1.7 2.0 1.7 1.8  --  1.6*  PHOS 3.5 3.8 3.7 3.4 4.3  --    Liver Function Tests:  Recent Labs Lab 06/30/15 0429 07/01/15 0529 07/02/15 0418 07/03/15 0543 07/04/15 0450  ALBUMIN 2.1* 2.3* 2.1* 2.1* 2.2*   No results for input(s): LIPASE, AMYLASE in the last 168 hours. No results for input(s): AMMONIA in the last 168 hours. CBC:  Recent Labs Lab 06/29/15 0407 06/30/15 0429 07/02/15 0418 07/03/15 0543 07/04/15 0508  WBC 7.8 7.9 7.7 15.6* 7.4  NEUTROABS  --   --   --   --  4.2  HGB 8.4* 8.2* 8.7* 8.1* 8.7*  HCT 25.9* 24.9* 26.6* 23.6* 26.3*  MCV 100.0 98.0 97.4 95.2 96.3  PLT 262 288 407* 452* 528*   Cardiac Enzymes: No results for input(s): CKTOTAL, CKMB, CKMBINDEX, TROPONINI in  the last 168 hours. BNP (last 3 results)  Recent Labs  12/03/14 1650  BNP 133.7*    ProBNP (last 3 results) No results for input(s): PROBNP in the last 8760 hours.  CBG:  Recent Labs Lab 07/02/15 0406 07/02/15 0736 07/02/15 1136 07/02/15 1230 07/04/15 0743  GLUCAP 79 75 62* 139* 75    No results found for this or any previous visit (from the past 240 hour(s)).   Studies: Dg Chest Port 1 View  07/03/2015   CLINICAL DATA:  64 year old male with leukocytosis. Subsequent encounter.  EXAM: PORTABLE CHEST 1 VIEW  COMPARISON:  06/30/2015.  FINDINGS: Change in appearance of asymmetric  airspace disease greater on the right with bilateral pleural effusions suspected. This may represent asymmetric pulmonary edema. Given the leukocytosis, infectious infiltrate, particularly in the lung bases cannot be excluded.  Feeding tube is in place with the tip not imaged on the present exam.  Cardiomegaly.  No pneumothorax.  IMPRESSION: Change in appearance of asymmetric airspace disease greater on the right with bilateral pleural effusions suspected. This may represent asymmetric pulmonary edema. Given the leukocytosis, infectious infiltrate, particularly in the lung bases cannot be excluded.   Electronically Signed   By: Genia Del M.D.   On: 07/03/2015 09:20    Scheduled Meds: . antiseptic oral rinse  7 mL Mouth Rinse q12n4p  . baclofen  5 mg Oral TID  . chlorhexidine  15 mL Mouth Rinse BID  . cloNIDine  0.2 mg Transdermal Weekly  . cycloSPORINE  1 drop Both Eyes BID  . escitalopram  20 mg Per Tube Daily  . free water  100 mL Per Tube 3 times per day  . guaiFENesin  5 mL Oral BID  . imipenem-cilastatin  500 mg Intravenous Q8H  . lacosamide (VIMPAT) IV  150 mg Intravenous Q12H  . magnesium sulfate 1 - 4 g bolus IVPB  4 g Intravenous Once  . metoprolol  7.5 mg Intravenous 4 times per day  . pantoprazole (PROTONIX) IV  40 mg Intravenous Q12H  . QUEtiapine  25 mg Oral QHS  . tamsulosin  0.4 mg Oral Daily   Continuous Infusions: . dextrose 5 % and 0.9% NaCl 1,000 mL with potassium chloride 40 mEq infusion 75 mL/hr at 07/04/15 0034  . feeding supplement (JEVITY 1.2 CAL) 1,000 mL (06/30/15 4650)    Principal Problem:   Cardiopulmonary arrest Active Problems:   Acute respiratory failure with hypoxia   HCAP (healthcare-associated pneumonia)   Depression   Mental retardation   Infantile cerebral palsy   Anemia   Seizure disorder   Esophagitis   Acute upper GI bleed   GI bleed   Ventilator dependent   Acute renal failure   Nausea & vomiting   Respiratory failure    Pneumonitis   Dysphagia, pharyngoesophageal phase   Acute blood loss anemia   Leukocytosis    Time spent: [facility] Pager [phone]. If 7PM-7AM, please contact night-coverage at [email or web address], password [facility] 07/04/2015, 10:18 AM  LOS: 14 days

## 2015-07-04 NOTE — Progress Notes (Signed)
Nutrition Follow-up  DOCUMENTATION CODES:   Not applicable  INTERVENTION:  - If TF to be initiated, recommend Jevity 1.2 @ 50 mL/hr with 30 mL Prostat once/day which will provide 1540 kcal, 82 grams protein, and 968 mL free water. - RD will continue to monitor for needs  NUTRITION DIAGNOSIS:   Inadequate oral intake related to inability to eat as evidenced by NPO status. -ongoing  GOAL:   Patient will meet greater than or equal to 90% of their needs -unmet  MONITOR:   Vent status, Weight trends, Labs, I & O's  REASON FOR ASSESSMENT:   Ventilator  ASSESSMENT:   64 yo WM with existing CP/MR with a history of HTN, erosive esophagitis, previous GI bleed who presented to [facility] from SNF coffee ground emesis and inability to protect airway and maintain adequate O2 saturations. He was intubated per EDP, started on PPI drip and GI was consulted. He is a ward of state and is a full code. State appt guardian is Moises Blood DDS [phone]. He will be admitted to ICU under PCCM service.  9/27 Panda was removed yesterday and pt advanced to CLD, nectar-thick. Pt coded this AM and now intubated with OGT in place. Needs re-estimated based on this.  Patient is currently intubated on ventilator support MV: 9.6 L/min Temp (24hrs), Avg:97.8 F (36.6 C), Min:97.6 F (36.4 C), Max:98 F (36.7 C)  Propofol: none  TF recommendations outlined above. Pt not meeting needs. Medications reviewed. Labs reviewed; CBGs: 62-139 mg/dL, Ca: 8.6 mg/dL, Mg: 2.6 mg/dL.    9/22 - Pt seen for new consult for RD to initiate and manage TF. - Pt is very high refeeding risk due to no nutrition for at least 9 days.  - Monitor magnesium, potassium, and phosphorus daily for at least 3 days, MD to replete as needed, as pt is at risk for refeeding syndrome given prolonged period of no nutrition. - Will order Jevity 1.2 @ 15 mL/hr and advance by 10 mL Q12h to goal rate of 55 mL/hr which will provide 1584 kcal, 73  grams protein, and 1065 mL free water. Will also order 100 mL free water Q8h to add additional 300 mL water/day.  9/16 - Patient is currently intubated on ventilator support - MV: 11.5 L/min - Propofol: 7.6 ml/hr (201 kcal) - Needs have been re-estimated based on current MV and medical course.  - Pt without nutrition support but NGT in place.  - Spoke with NP this AM who states weaning of vent in progress with plan to turn off Propofol to trial extubation; he is hopeful if this occurs diet advancement to occur later today versus tomorrow but requests TF recommendations in case needed. Unsure of pt's baseline diet texture tolerance.  9/14 - Patient is currently intubated on ventilator support with OGT in place - MV: 13.9 L/min; Propofol: 13.4 ml/hr (354 kcal) -RN states pt does not have PEG or G-J tube from PTA.  - Per weight hx review, pt has gained 25 lbs since 10/2014. Mild muscle wasting noted to clavicle area and lower legs; no fat wasting. - GI note from this AM indicates no active GIB overnight; will monitor for ability to initiate TF if pt to remain intubated >24 hours. - Recommended Vital AF 1.2 @ 55 mL/hr which will provide, in combination with current Propofol rate, 1938 kcal, 99 grams protein, and 1070 mL free water.   Diet Order:  Diet NPO time specified  Skin:  Reviewed, no issues  Last BM:  9/26  Height:   Ht Readings from Last 1 Encounters:  06/20/15  (1.702 m)    Weight:   Wt Readings from Last 1 Encounters:  07/04/15 134 lb 11.2 oz (61.1 kg)    Ideal Body Weight:  67.27 kg (kg)  BMI:  Body mass index is 21.09 kg/(m^2).  Estimated Nutritional Needs:   Kcal:  1520  Protein:  73-92 grams  Fluid:  1.5L/day  EDUCATION NEEDS:   No education needs identified at this time     Trenton Gammon, RD, LDN Inpatient Clinical Dietitian Pager # [phone] After hours/weekend pager # [phone]

## 2015-07-04 NOTE — Progress Notes (Signed)
PULMONARY / CRITICAL CARE MEDICINE   Name: Colin Bradley MRN: [id number] DOB: 1951/01/29    ADMISSION DATE:  06/20/2015   REFERRING MD :  EDP  CHIEF COMPLAINT:  GIB  INITIAL PRESENTATION: 64 year old male with known cerebral palsy and intellectual disability is a ward of the state. Presented with coffee-ground emesis on 9/13. Intubated for worsening hypoxic respiratory failure and evidence of aspiration pneumonia on chest x-ray imaging post-intubation.  STUDIES:  Portable CXR 9/13 -  right sided mid and lower lung opacification. Renal US 9/13 - small right renal cyst. Cholelithiasis noted. No hydronephrosis.  SIGNIFICANT EVENTS: 9/13  Intubated & admitted Upper GIB 9/13  EGD w/ hiatal hernia but no source of active bleeding 9/14  hyperchloremic acidosis  9/15  tachycardic, lopressor restarted. Continued on rx for pneumonia  9/16  WBC trending down, hgb stable, still w/ NAG metabolic acidosis.  9/18  hgb down w/o active bleeding. Patient more awake. 9/19  Extubated  9/20  To TRH 9/27  Asystolic arrest, intubated on floor.  Required ~1 minute of CPR with ROSC.  Tx back to ICU   SUBJECTIVE: Asystolic arrest, intubated on floor.  Required ~1 minute of CPR with ROSC.  Tx back to ICU    VITAL SIGNS: Temp:  [97.6 F (36.4 C)-98 F (36.7 C)] 97.9 F (36.6 C) (09/27 0328) Pulse Rate:  [62-92] 63 (09/27 0328) Resp:  [20] 20 (09/27 0328) BP: (135-155)/(59-72) 155/70 mmHg (09/27 0328) SpO2:  [99 %-100 %] 100 % (09/27 0328) Weight:  [134 lb 11.2 oz (61.1 kg)] 134 lb 11.2 oz (61.1 kg) (09/27 0328)   HEMODYNAMICS:     VENTILATOR SETTINGS:     INTAKE / OUTPUT:  Intake/Output Summary (Last 24 hours) at 07/04/15 1027 Last data filed at 07/04/15 1478  Gross per 24 hour  Intake 1925.41 ml  Output   2725 ml  Net -799.59 ml    PHYSICAL EXAMINATION: General : adult male on mechanical vent, appears uncomfortable  HENT: NCAT ETT in place PULM:  Even/non-labored, lungs  bilaterally with coarse rhonchi CV: RRR, no mgr GI: BS+, soft, nontender MSK: no obvious deformities Neuro: appears agitated but no follow commands  LABS:  CBC  Recent Labs Lab 07/02/15 0418 07/03/15 0543 07/04/15 0508  WBC 7.7 15.6* 7.4  HGB 8.7* 8.1* 8.7*  HCT 26.6* 23.6* 26.3*  PLT 407* 452* 528*   Coag's No results for input(s): APTT, INR in the last 168 hours.   BMET  Recent Labs Lab 07/02/15 0418 07/03/15 0543 07/04/15 0450  NA 140 140 138  K 4.3 4.3 4.3  CL 116* 114* 109  CO2 20* 19* 22  BUN CREATININE 0.76 0.81 0.89  GLUCOSE 86 101* 88   Electrolytes  Recent Labs Lab 07/02/15 0418 07/03/15 0543 07/04/15 0450 07/04/15 0508  CALCIUM 8.0* 8.0* 8.3*  --   MG 1.7 1.8  --  1.6*  PHOS 3.7 3.4 4.3  --    Sepsis Markers No results for input(s): LATICACIDVEN, PROCALCITON, O2SATVEN in the last 168 hours.   ABG No results for input(s): PHART, PCO2ART, PO2ART in the last 168 hours.   Liver Enzymes  Recent Labs Lab 07/02/15 0418 07/03/15 0543 07/04/15 0450  ALBUMIN 2.1* 2.1* 2.2*   Cardiac Enzymes No results for input(s): TROPONINI, PROBNP in the last 168 hours.   Glucose  Recent Labs Lab 07/02/15 0021 07/02/15 0406 07/02/15 0736 07/02/15 1136 07/02/15 1230 07/04/15 0743  GLUCAP 87 79 75 62* 139* 75  Imaging No results found.   ASSESSMENT / PLAN:  PULMONARY OETT 9/14 >> 9/19, 9/27 >>  A: Acute Ventilator Dependent Respiratory Failure - prior acute hypoxic failure secondary to HCAP with suspected aspiration.  Recurrent respiratory failure 9/27 in the setting of cardiac arrest.  HCAP - ESBL Klebsiella Improving Resp Secretions P:   MV support, 8 cc/kg Wean PEEP / FiO2 for sats > 90% Follow up ABG and CXR now  Aspiration precautions  Robinul PRN for increased secretions  Palliative care following for goals of care  CARDIOVASCULAR A: Asystolic Arrest - 9/27 Hypertension  H/O RBBB Sinus Tachycardia  > resolved P:   ICU monitoring  Assess EKG, troponin Continue IV metoprolol Clonidine 0.2 mg patch  RENAL GU A:  Acute Renal Failure - Resolved Hypomagnesemia  H/O BPH Non-anion gap acidosis - due to volume resuscitation, resolved.  P:   Monitor BMET and UOP STAT labs now Replace electrolytes as needed, Mg 9/27 Flomax  Free water 100 ml per tube Q8 S/p lasix 40 mg 9/27 am  GASTROINTESTINAL A:   Upper GIB - EGD with erosions GEJ, likely prior source? Mallory weiss?, no active bleeding; no acute bleeding (9/19)  Dysphagia  Hiatal Hernia  P:   Trend CBC daily Cont PPI per GI-Protonix IV twice a day; GI has signed off  HOLD tube feeding 9/27 D5NS with 40 mEq KCL at 50 ml/hr SLP following.  Has had difficulty with swallowing and high risk for aspiration.  He may need PEG tube placed for chronic feeding.  Although, this will not reduce his risk of aspiration.   HEMATOLOGIC A:   Anemia - secondary to Upper GIB. No further signs of bleeding but Hgb trending down Leukocytosis - resolved P:  Trend Hgb daily w/ CBC  Transfuse for Hgb < 7gm/dL SCDs  INFECTIOUS A:   HCAP - ESBL Klebsiella.   P:   Resp Ctx 9/14 >> ESBL Klebsiella  Primaxin 9/16 >>  Unasyn 9/13 >> 9/16  D12/x abx Plan d/c at D14  ENDOCRINE A:   Hypoglycemia - Resolved P:   CBG every 4 hours  NEUROLOGIC A:   H/O Cerebral Palsy H/O Mental Disability  H/O Seizure Disorder P:   Continue Vimpat Baclofen  PRN fentanyl / versed for sedation / pain Goal RASS:  -1   FAMILY  - Updates: None at bedside, ward of state.   - Inter-disciplinary family meet or Palliative Care meeting due by:  9/27, attempted to call HCPOA. Lissa Merlin [phone]. Message left.  Her voice mail indicates she will return calls within 48 hours.  If no response to call her supervisor at [phone].    Canary Brim, NP-C [facility] Pgr: [phone] or if no answer [phone] 07/04/2015, 10:27 AM

## 2015-07-04 NOTE — Significant Event (Addendum)
Rapid Response Event Note  Overview:      at      at          [place], [place] F

## 2015-07-04 NOTE — Progress Notes (Signed)
   07/04/15 1000  Clinical Encounter Type  Visited With Patient;Other (Comment) (Code Blue)  Visit Type Critical Care;Code  Referral From Other (Comment)  Consult/Referral To Chaplain  Spiritual Encounters  Spiritual Needs Emotional;Other (Comment) (Pastoral Presence)  Stress Factors  Patient Stress Factors None identified   The Chaplain responded to the overhead Code Blue for room 1429, pt. Colin Bradley. A Chaplain had seen Colin Bradley previously in the ICU. The Chaplain asked if there were any family members present, which she was told there were not.  Chaplain interventions included pastoral presence and staff support. The Chaplain will follow up with the patient once he is settled in the ICU.

## 2015-07-04 NOTE — Anesthesia Procedure Notes (Signed)
Procedure Name: Intubation Date/Time: 07/04/2015 10:01 AM Performed by: Elyn Peers Pre-anesthesia Checklist: Patient identified, Emergency Drugs available, Suction available, Patient being monitored and Timeout performed Patient Re-evaluated:Patient Re-evaluated prior to inductionOxygen Delivery Method: Ambu bag Preoxygenation: Pre-oxygenation with 100% oxygen Intubation Type: IV induction Laryngoscope Size: Miller and 3 Grade View: Grade II Tube type: Subglottic suction tube Tube size: 8.0 mm Number of attempts: 1 Airway Equipment and Method: Stylet Placement Confirmation: ETT inserted through vocal cords under direct vision,  positive ETCO2 and breath sounds checked- equal and bilateral Secured at: 22 cm Tube secured with: Tape Dental Injury: Teeth and Oropharynx as per pre-operative assessment

## 2015-07-04 NOTE — Progress Notes (Signed)
ED RT assisted with with intubation. ICU RT assisted with bagging PT while being transported to [facility] ICU- uneventful.

## 2015-07-04 NOTE — Progress Notes (Signed)
CSW following for return to [facility] when medically ready. CSW has completed FL2 & will continue to follow and assist with return.    Lincoln Maxin, LCSW [facility] Clinical Social Worker cell #: [phone]

## 2015-07-04 NOTE — ED Provider Notes (Signed)
10:12 AM Called to a code on the floor. Patient apparently asystolic with brief arrest. CPR for ~1 minute, now NSR and hypertensive. Agonal breathing, does not respond to painful stimuli. Anesthesia at bedside intubating. HDS after intubation, Dr. Janee Morn (triad hospitalist) at bedside and assumes care and will call critical care.  Pricilla Loveless, MD 07/04/15 1013

## 2015-07-04 NOTE — Progress Notes (Signed)
  This NP has had continued conversation with DSS/guardian Lissa Merlin regarding the importance of continued conversation and documentation of advanced directives for Mr Colin Bradley.  Detailed discussion regarding concept of failure to thrive was had, patient is high risk of decompensation.  The difference between an aggressive medical intervention path and a palliative comfort path was detailed.    Patient indeed did decompensate today and required intubation for life support.  I did receive the packet of forms that DSS is requiring to be completed before consideration of Code Status.  CCM is aware that forms are on front on hard chart.   Dennard Nip is encouraged to call with questions and concerns   Total time spent on the unit was 35 minutes.  Time in 1500  Time out 1535.  50 % of the time spend in counseling and coordination of care    Lorinda Creed NP  Palliative Medicine Team Team Phone # [phone] Pager [phone]

## 2015-07-04 NOTE — Progress Notes (Signed)
  Echocardiogram 2D Echocardiogram has been performed.  Colin Bradley 07/04/2015, 11:46 AM

## 2015-07-04 NOTE — Progress Notes (Addendum)
Central telemetry callled and states pt in Asystole. When I got to door called pt no response and head Slumped.Hob bed lowered and called code cpr started.see code sheet for further.

## 2015-07-05 ENCOUNTER — Inpatient Hospital Stay (HOSPITAL_COMMUNITY): Payer: Medicare Other

## 2015-07-05 DIAGNOSIS — Z7189 Other specified counseling: Secondary | ICD-10-CM

## 2015-07-05 DIAGNOSIS — R1310 Dysphagia, unspecified: Secondary | ICD-10-CM | POA: Insufficient documentation

## 2015-07-05 DIAGNOSIS — Z515 Encounter for palliative care: Secondary | ICD-10-CM

## 2015-07-05 LAB — GLUCOSE, CAPILLARY
GLUCOSE-CAPILLARY: 83 mg/dL (ref 65–99)
GLUCOSE-CAPILLARY: 65 mg/dL (ref 65–99)
GLUCOSE-CAPILLARY: 96 mg/dL (ref 65–99)
GLUCOSE-CAPILLARY: 90 mg/dL (ref 65–99)
GLUCOSE-CAPILLARY: 88 mg/dL (ref 65–99)
GLUCOSE-CAPILLARY: 81 mg/dL (ref 65–99)
GLUCOSE-CAPILLARY: 75 mg/dL (ref 65–99)
GLUCOSE-CAPILLARY: 76 mg/dL (ref 65–99)
GLUCOSE-CAPILLARY: 82 mg/dL (ref 65–99)
Glucose-Capillary: 101 mg/dL — ABNORMAL HIGH (ref 65–99)
Glucose-Capillary: 69 mg/dL (ref 65–99)
Glucose-Capillary: 74 mg/dL (ref 65–99)
Glucose-Capillary: 76 mg/dL (ref 65–99)
Glucose-Capillary: 84 mg/dL (ref 65–99)
Glucose-Capillary: 92 mg/dL (ref 65–99)
Glucose-Capillary: 94 mg/dL (ref 65–99)
Glucose-Capillary: 76 mg/dL (ref 65–99)
Glucose-Capillary: 79 mg/dL (ref 65–99)

## 2015-07-05 LAB — CBC
HEMATOCRIT: 25.9 % — AB (ref 39.0–52.0)
Hemoglobin: 8.7 g/dL — ABNORMAL LOW (ref 13.0–17.0)
MCH: 32.3 pg (ref 26.0–34.0)
MCHC: 33.6 g/dL (ref 30.0–36.0)
MCV: 96.3 fL (ref 78.0–100.0)
Platelets: 579 10*3/uL — ABNORMAL HIGH (ref 150–400)
RBC: 2.69 MIL/uL — AB (ref 4.22–5.81)
RDW: 13.6 % (ref 11.5–15.5)
WBC: 7.5 10*3/uL (ref 4.0–10.5)

## 2015-07-05 LAB — MAGNESIUM: Magnesium: 1.8 mg/dL (ref 1.7–2.4)

## 2015-07-05 LAB — RENAL FUNCTION PANEL
ALBUMIN: 2.2 g/dL — AB (ref 3.5–5.0)
ANION GAP: 7 (ref 5–15)
BUN: 9 mg/dL (ref 6–20)
CHLORIDE: 108 mmol/L (ref 101–111)
CO2: 21 mmol/L — ABNORMAL LOW (ref 22–32)
Calcium: 7.9 mg/dL — ABNORMAL LOW (ref 8.9–10.3)
Creatinine, Ser: 0.98 mg/dL (ref 0.61–1.24)
Glucose, Bld: 75 mg/dL (ref 65–99)
PHOSPHORUS: 3.9 mg/dL (ref 2.5–4.6)
POTASSIUM: 4 mmol/L (ref 3.5–5.1)
Sodium: 136 mmol/L (ref 135–145)

## 2015-07-05 LAB — PHOSPHORUS: PHOSPHORUS: 3.9 mg/dL (ref 2.5–4.6)

## 2015-07-05 MED ORDER — ANTISEPTIC ORAL RINSE SOLUTION (CORINZ)
7.0000 mL | Freq: Four times a day (QID) | OROMUCOSAL | Status: DC
  Administered 2015-07-05 – 2015-07-12 (×26): 7 mL via OROMUCOSAL

## 2015-07-05 MED ORDER — VITAL HIGH PROTEIN PO LIQD
1000.0000 mL | ORAL | Status: DC
  Administered 2015-07-05 – 2015-07-06 (×4): 1000 mL
  Filled 2015-07-05 (×3): qty 1000

## 2015-07-05 MED ORDER — CHLORHEXIDINE GLUCONATE 0.12% ORAL RINSE (MEDLINE KIT)
15.0000 mL | Freq: Two times a day (BID) | OROMUCOSAL | Status: DC
  Administered 2015-07-05 – 2015-07-12 (×14): 15 mL via OROMUCOSAL

## 2015-07-05 NOTE — Clinical Documentation Improvement (Signed)
Hospitalist  Possible Clinical Conditions:  - Acute Pulmonary Edema  - Heart Failure, including acuity and type  - Other Condition  - Unable to clinically determine  Clinical Information: Lasix 40 mg IV given on 07/03/15 and 07/04/15. CXR 07/03/15  Pulmonary edema  CXR 07/04/15  Edema CXR 07/05/15  Improving interstitial edema I&O 07/02/15 to 07/03/15 - 1380 mls I&O 07/03/15 to 07/04/15 - 2725 mls I&O 07/04/15 to 07/05/15 -  775 mls Echo 07/04/15 - EF 45-50%, grade 1 diastolic dysfunction  (please document your response in the progress notes and not on the query form itself.)  Please exercise your independent, professional judgment when responding. A specific answer is not anticipated or expected.   Thank You, Jerral Ralph  RN BSN CCDS [phone] Health Information Management [place]

## 2015-07-05 NOTE — Progress Notes (Signed)
   07/05/15 1000  Clinical Encounter Type  Visited With Patient  Visit Type Follow-up;Psychological support;Spiritual support;Critical Care  Referral From Nurse  Consult/Referral To Chaplain  Spiritual Encounters  Spiritual Needs Prayer;Emotional;Other (Comment) (Pastoral Conversation)  Stress Factors  Patient Stress Factors None identified   The Chaplain followed up with the patient to give him emotional support and to visit with him. The patient was unable to talk to the Chaplain, but was able to nod in agreement or disagreement. The patient nodded yes when asked if he was doing all right today. He nodded when asked yes or no question. The Chaplain prayed with the patient and got him another bone pillow, prayer shawl and prayer heart for his room.  The Chaplain will follow up with this patient regularly due to his lack of visitors.   Chaplain Clint Bolder M.Div

## 2015-07-05 NOTE — Progress Notes (Signed)
Date:  Sept. 28, 2016 U.R. performed for needs and level of care. Cardiac arrest on 09272016/coded , intubated and return to icu Will continue to follow for Case Management needs.  Marcelle Smiling, RN, BSN, [place]   [phone]

## 2015-07-05 NOTE — Progress Notes (Signed)
PULMONARY / CRITICAL CARE MEDICINE   Name: Colin Bradley MRN: [id number] DOB: 08-31-1951    ADMISSION DATE:  06/20/2015   REFERRING MD :  EDP  CHIEF COMPLAINT:  GIB  INITIAL PRESENTATION: 64 year old male with known cerebral palsy and intellectual disability is a ward of the state. Presented with coffee-ground emesis on 9/13. Intubated for worsening hypoxic respiratory failure and evidence of aspiration pneumonia on chest x-ray imaging post-intubation.  STUDIES:  Portable CXR 9/13 -  right sided mid and lower lung opacification. Renal US 9/13 - small right renal cyst. Cholelithiasis noted. No hydronephrosis.  SIGNIFICANT EVENTS: 9/13  Intubated & admitted Upper GIB 9/13  EGD w/ hiatal hernia but no source of active bleeding 9/14  hyperchloremic acidosis  9/15  tachycardic, lopressor restarted. Continued on rx for pneumonia  9/16  WBC trending down, hgb stable, still w/ NAG metabolic acidosis.  9/18  hgb down w/o active bleeding. Patient more awake. 9/19  Extubated  9/20  To TRH 9/27  Asystolic arrest, intubated on floor.  Required ~1 minute of CPR with ROSC.  Tx back to ICU 9/28  Pt more alert, not tolerating weaning due to low volumes   SUBJECTIVE:  RT reports pt not tolerating weaning, low volumes. Awake/alert   VITAL SIGNS: Temp:  [98.2 F (36.8 C)-99.7 F (37.6 C)] 98.8 F (37.1 C) (09/28 1550) Pulse Rate:  [55-81] 61 (09/28 1550) Resp:  [0-21] 18 (09/28 1550) BP: (91-162)/(42-82) 125/69 mmHg (09/28 1500) SpO2:  [95 %-100 %] 96 % (09/28 1550) FiO2 (%):  [40 %] 40 % (09/28 1220) Weight:  [130 lb 1.1 oz (59 kg)] 130 lb 1.1 oz (59 kg) (09/28 0400)   HEMODYNAMICS:     VENTILATOR SETTINGS: Vent Mode:  [-] PRVC FiO2 (%):  [40 %] 40 % Set Rate:  [18 bmp] 18 bmp Vt Set:  [530 mL] 530 mL PEEP:  [5 cmH20] 5 cmH20 Plateau Pressure:  [15 cmH20-22 cmH20] 20 cmH20   INTAKE / OUTPUT:  Intake/Output Summary (Last 24 hours) at 07/05/15 1553 Last data filed at 07/05/15  1500  Gross per 24 hour  Intake 2137.16 ml  Output      0 ml  Net 2137.16 ml    PHYSICAL EXAMINATION: General : adult male on mechanical vent, NAD  HENT: NCAT ETT in place PULM:  Even/non-labored, lungs bilaterally coarse CV: RRR, no mgr GI: BS+, soft, nontender MSK: no obvious deformities Neuro: awake/alert, nods to questions appropriately  LABS:  CBC  Recent Labs Lab 07/04/15 0508 07/04/15 1025 07/05/15 0335  WBC 7.4 9.7 7.5  HGB 8.7* 10.3* 8.7*  HCT 26.3* 31.3* 25.9*  PLT 528* 698* 579*   Coag's No results for input(s): APTT, INR in the last 168 hours.   BMET  Recent Labs Lab 07/04/15 0450 07/04/15 1025 07/05/15 0335  NA 138 137 136  K 4.3 4.6 4.0  CL 109 106 108  CO2 22 20* 21*  BUN CREATININE 0.89 0.94 0.98  GLUCOSE 88 106* 75   Electrolytes  Recent Labs Lab 07/03/15 0543 07/04/15 0450 07/04/15 0508 07/04/15 1025 07/05/15 0335  CALCIUM 8.0* 8.3*  --  8.6* 7.9*  MG 1.8  --  1.6* 2.6* 1.8  PHOS 3.4 4.3  --   --  3.9  3.9   Sepsis Markers No results for input(s): LATICACIDVEN, PROCALCITON, O2SATVEN in the last 168 hours.   ABG  Recent Labs Lab 07/04/15 1226  PHART 7.428  PCO2ART 33.5*  PO2ART 378*  Liver Enzymes  Recent Labs Lab 07/04/15 0450 07/04/15 1025 07/05/15 0335  AST  --  27  --   ALT  --  10*  --   ALKPHOS  --  89  --   BILITOT  --  0.4  --   ALBUMIN 2.2* 2.7* 2.2*   Cardiac Enzymes  Recent Labs Lab 07/04/15 1025 07/04/15 1620 07/04/15 2220  TROPONINI <0.03 0.04* <0.03     Glucose  Recent Labs Lab 07/04/15 1957 07/04/15 2103 07/04/15 2343 07/05/15 0319 07/05/15 0749 07/05/15 1537  GLUCAP 62* 106* 76 75 76 76    Imaging Dg Abd 1 View  07/04/2015   CLINICAL DATA:  Assess positioning of orogastric tube.  EXAM: ABDOMEN - 1 VIEW  COMPARISON:  KUB of July 02, 2015  FINDINGS: The orogastric tube tip projects in the region of the distal stomach with the proximal port in the gastric body.  There is contrast within the ascending and proximal transverse colon and rectosigmoid.  IMPRESSION: The orogastric tube appears to lie in the stomach with the tip at the level of the pylorus.   Electronically Signed   By: David  [place] M.D.   On: 07/04/2015 17:08   Dg Chest Port 1 View  07/05/2015   CLINICAL DATA:  Respiratory failure, healthcare associated pneumonia, intubated patient, gastrointestinal bleeding  EXAM: PORTABLE CHEST 1 VIEW  COMPARISON:  Portable chest x-ray of April 03, 2015  FINDINGS: The lungs are adequately inflated. The pulmonary interstitial markings remain increased but have improved somewhat. The hemidiaphragms are now partially visible. The cardiac silhouette remains enlarged. The central pulmonary vascularity is less engorged.  The endotracheal tube tip lies 5.6 cm above the carina. The esophagogastric tube tip and proximal port project below the GE junction.  IMPRESSION: Improving pulmonary interstitial edema or pneumonia. Small bilateral pleural effusions are less conspicuous. The support tubes are in reasonable position.   Electronically Signed   By: David  [place] M.D.   On: 07/05/2015 07:22     ASSESSMENT / PLAN:  PULMONARY OETT 9/14 >> 9/19, 9/27 >>  A: Acute Ventilator Dependent Respiratory Failure - prior acute hypoxic failure secondary to HCAP with suspected aspiration.  Recurrent respiratory failure 9/27 in the setting of cardiac arrest.  HCAP - ESBL Klebsiella Improving Resp Secretions P:   MV support, 8 cc/kg Wean PEEP / FiO2 for sats > 90% Daily SBT / WUA  Aspiration precautions  Robinul PRN for increased secretions  Palliative care following for goals of care  CARDIOVASCULAR A: Asystolic Arrest - 9/27 Hypertension  H/O RBBB Sinus Tachycardia  > resolved P:  ICU monitoring  Assess EKG, troponin Continue IV metoprolol Clonidine 0.2 mg patch  RENAL GU A:  Acute Renal Failure - Resolved Hypomagnesemia  H/O BPH Non-anion gap acidosis - due to  volume resuscitation, resolved.  P:   Monitor BMET and UOP Replace electrolytes as needed Flomax  Free water 100 ml per tube Q8  GASTROINTESTINAL A:   Upper GIB - EGD with erosions GEJ, likely prior source? Mallory weiss?, no active bleeding; no acute bleeding (9/19)  Dysphagia  Hiatal Hernia  P:   Trend CBC daily Cont PPI per GI-Protonix IV twice a day; GI has signed off  Resume TF 9/28 D5NS with 40 mEq KCL at 50 ml/hr SLP following.  Has had difficulty with swallowing and high risk for aspiration.  He may need PEG tube placed for chronic feeding.  Although, this will not reduce his risk of aspiration.  HEMATOLOGIC A:   Anemia - secondary to Upper GIB. No further signs of bleeding but Hgb trending down Leukocytosis - resolved P:  Trend Hgb daily w/ CBC  Transfuse for Hgb < 7gm/dL SCDs  INFECTIOUS A:   HCAP - ESBL Klebsiella.   P:   Resp Ctx 9/14 >> ESBL Klebsiella  Primaxin 9/16 >>  Unasyn 9/13 >> 9/16  D13/x abx Plan d/c at D14  ENDOCRINE A:   Hypoglycemia - Resolved P:   CBG every 4 hours  NEUROLOGIC A:   H/O Cerebral Palsy H/O Mental Disability  H/O Seizure Disorder P:   Continue Vimpat Baclofen  PRN fentanyl / versed for sedation / pain Goal RASS: 0   FAMILY  - Updates: None at bedside, ward of state.   - Inter-disciplinary family meet or Palliative Care meeting due by:  Updated HCPOA 9/27. Planned meeting 9/28 with Palliative Care.  Paper work sent to the hospital for DNR initiation. Must be filled out by MD. Avon Gully) Lissa Merlin [phone].       Canary Brim, NP-C [facility] [facility] Pgr: [phone] or if no answer [phone] 07/05/2015, 3:53 PM

## 2015-07-06 ENCOUNTER — Inpatient Hospital Stay (HOSPITAL_COMMUNITY): Payer: Medicare Other

## 2015-07-06 LAB — GLUCOSE, CAPILLARY
GLUCOSE-CAPILLARY: 106 mg/dL — AB (ref 65–99)
GLUCOSE-CAPILLARY: 97 mg/dL (ref 65–99)
Glucose-Capillary: 100 mg/dL — ABNORMAL HIGH (ref 65–99)
Glucose-Capillary: 108 mg/dL — ABNORMAL HIGH (ref 65–99)
Glucose-Capillary: 86 mg/dL (ref 65–99)
Glucose-Capillary: 95 mg/dL (ref 65–99)

## 2015-07-06 LAB — RENAL FUNCTION PANEL
ANION GAP: 7 (ref 5–15)
Albumin: 2.1 g/dL — ABNORMAL LOW (ref 3.5–5.0)
BUN: 11 mg/dL (ref 6–20)
CHLORIDE: 110 mmol/L (ref 101–111)
CO2: 19 mmol/L — AB (ref 22–32)
Calcium: 7.8 mg/dL — ABNORMAL LOW (ref 8.9–10.3)
Creatinine, Ser: 1.04 mg/dL (ref 0.61–1.24)
GFR calc non Af Amer: >60 mL/min (ref >60)
Glucose, Bld: 114 mg/dL — ABNORMAL HIGH (ref 65–99)
Phosphorus: 3.8 mg/dL (ref 2.5–4.6)
Potassium: 4.1 mmol/L (ref 3.5–5.1)
Sodium: 136 mmol/L (ref 135–145)

## 2015-07-06 LAB — CBC
HEMATOCRIT: 24.7 % — AB (ref 39.0–52.0)
HEMOGLOBIN: 8.2 g/dL — AB (ref 13.0–17.0)
MCH: 31.8 pg (ref 26.0–34.0)
MCHC: 33.2 g/dL (ref 30.0–36.0)
MCV: 95.7 fL (ref 78.0–100.0)
Platelets: 557 10*3/uL — ABNORMAL HIGH (ref 150–400)
RBC: 2.58 MIL/uL — AB (ref 4.22–5.81)
RDW: 13.4 % (ref 11.5–15.5)
WBC: 8.1 10*3/uL (ref 4.0–10.5)

## 2015-07-06 LAB — MAGNESIUM: MAGNESIUM: 1.7 mg/dL (ref 1.7–2.4)

## 2015-07-06 NOTE — Care Management Important Message (Signed)
Important Message  Patient Details  Name: Colin Bradley MRN: [id number] Date of Birth: 1951-02-23   Medicare Important Message Given:  Yes-fourth notification given    Renie Ora 07/06/2015, 4:11 PMImportant Message  Patient Details  Name: Colin Bradley MRN: [id number] Date of Birth: 09-25-1951   Medicare Important Message Given:  Yes-fourth notification given    Renie Ora 07/06/2015, 4:10 PM

## 2015-07-06 NOTE — Progress Notes (Signed)
130cc of expired Fentanyl gtt wasted in sink w/ Marcille Buffy, RN. New bag hung.   Starla Link, RN

## 2015-07-06 NOTE — Ethics Note (Signed)
Ethics Committee Consult  Date: 07/06/2015 Time: 0915  Primary Committee Member:  Colin Stacks, RN Secondary Committee Member:  Colin Bradley  Does the patient have decision making capacity?  No - Unknown. Pt intubated at this time.  Does the patient have an Scientist, water quality or Health Care Power of Attorney?  Yes  Legal Decision Maker:  Colin Bradley, State worker. Pt is ward of the state. Relationship to Patient:  Case Manager Mitigating Factors:  Yes- [employer]. Has a history of CP and has questionable decision making capacity.l   Name and contact information for person who requested the consult:  Earnest Rosier, NP; Isaiah Serge, MD  Attending Physician:  Chilton Greathouse, MD Was attending physician notified?  Yes  Other individuals involved, present/attending, their names, relationship/role to the patient:  none  Individuals involved but unable to attend/participate their names, relationship/role to the patient:  none  Reason for the consult / ethical problem (eg. Goals of care, values clarification, forum for discussion, conflict resolution, autonomy, beneficence, etc.):  Goals of care  Preliminary Information: [name] was admitted with GI bleed and subsequently required intubation and mechanical ventilation. He regained stability and was extubated and subsequently transferred to hospitalist care and out of ICU. He later suffered a cardiac arrest/asystole and is currently on mechanical ventilation again.  Objective facts related to or contributing to the problem (eg.  Pertinent medical history, facts of the case):  [name] is a full code. His course of illness is as follows: 9/13 Intubated & admitted Upper GIB 9/13 EGD w/ hiatal hernia but no source of active bleeding 9/14 hyperchloremic acidosis  9/15 tachycardic, lopressor restarted. Continued on rx for pneumonia  9/16 WBC trending down, hgb stable, still w/ NAG metabolic acidosis.  9/18 hgb down  w/o active bleeding. Patient more awake. 9/19 Extubated  9/20 To TRH 9/27 Asystolic arrest, intubated on floor. Required ~1 minute of CPR with ROSC. Tx back to ICU 9/28 Pt more alert, not tolerating weaning due to low volumes  He is at present unable to communicate with the healthcare team though he is awake and alert. [name], CCM NP contacted the ethics committee regarding clarification of goals of care, specifically regarding extubation and re-intubation if needed. This would subsequently impact his code status. Other concerns are a request from the HCPOA Colin Bradley on behalf of the state) for a second MD opinion of the case. An additional concern is that of feeding as the patient is currently on enteral nutrition due to being mechanically ventilated.   Summary of consultation and recommendations:  Mr. Colin Bradley, Spiritual Care and Ethics Committee Co-Chair was consulted and given the facts of the case. He stated he would follow up with [name] today and others as needed to continue to provide assistance with this consultation.

## 2015-07-06 NOTE — Progress Notes (Signed)
PULMONARY / CRITICAL CARE MEDICINE   Name: Colin Bradley MRN: [id number] DOB: [birth year]    ADMISSION DATE:  06/20/2015   REFERRING MD :  EDP  CHIEF COMPLAINT:  GIB  INITIAL PRESENTATION: 64 year old male with known cerebral palsy and intellectual disability is a ward of the state. Presented with coffee-ground emesis on 9/13. Intubated for worsening hypoxic respiratory failure and evidence of aspiration pneumonia on chest x-ray imaging post-intubation.  STUDIES:  Portable CXR 9/13 -  right sided mid and lower lung opacification. Renal US 9/13 - small right renal cyst. Cholelithiasis noted. No hydronephrosis.  SIGNIFICANT EVENTS: 9/13  Intubated & admitted Upper GIB 9/13  EGD w/ hiatal hernia but no source of active bleeding 9/14  hyperchloremic acidosis  9/15  tachycardic, lopressor restarted. Continued on rx for pneumonia  9/16  WBC trending down, hgb stable, still w/ NAG metabolic acidosis.  9/18  hgb down w/o active bleeding. Patient more awake. 9/19  Extubated  9/20  To TRH 9/27  Asystolic arrest, intubated on floor.  Required ~1 minute of CPR with ROSC.  Tx back to ICU 9/28  Pt more alert, not tolerating weaning due to low volumes   SUBJECTIVE:   RT reports pt failed SBT   VITAL SIGNS: Temp:  [98.8 F (37.1 C)-99.5 F (37.5 C)] 99.5 F (37.5 C) (09/29 0800) Pulse Rate:  [58-95] 59 (09/29 0900) Resp:  [8-25] 18 (09/29 0900) BP: (87-167)/(46-110) 123/50 mmHg (09/29 0900) SpO2:  [90 %-100 %] 98 % (09/29 0900) FiO2 (%):  [30 %-60 %] 30 % (09/29 0800) Weight:  [133 lb 9.6 oz (60.6 kg)] 133 lb 9.6 oz (60.6 kg) (09/29 0500)   HEMODYNAMICS:     VENTILATOR SETTINGS: Vent Mode:  [-] PRVC FiO2 (%):  [30 %-60 %] 30 % Set Rate:  [18 bmp] 18 bmp Vt Set:  [530 mL] 530 mL PEEP:  [5 cmH20] 5 cmH20 Plateau Pressure:  [19 cmH20-21 cmH20] 20 cmH20   INTAKE / OUTPUT:  Intake/Output Summary (Last 24 hours) at 07/06/15 1032 Last data filed at 07/06/15 0900  Gross per 24  hour  Intake 2341.29 ml  Output    654 ml  Net 1687.29 ml    PHYSICAL EXAMINATION: General : adult male on mechanical vent, NAD  HENT: NCAT ETT in place PULM:  Even/non-labored, lungs bilaterally coarse CV: RRR, no mgr GI: BS+, soft, nontender MSK: no obvious deformities Neuro: awake/alert, nods to questions appropriately  LABS:  CBC  Recent Labs Lab 07/04/15 1025 07/05/15 0335 07/06/15 0345  WBC 9.7 7.5 8.1  HGB 10.3* 8.7* 8.2*  HCT 31.3* 25.9* 24.7*  PLT 698* 579* 557*   Coag's No results for input(s): APTT, INR in the last 168 hours.   BMET  Recent Labs Lab 07/04/15 1025 07/05/15 0335 07/06/15 0345  NA 137 136 136  K 4.6 4.0 4.1  CL 106 108 110  CO2 20* 21* 19*  BUN CREATININE 0.94 0.98 1.04  GLUCOSE 106* 75 114*   Electrolytes  Recent Labs Lab 07/04/15 0450  07/04/15 1025 07/05/15 0335 07/06/15 0345  CALCIUM 8.3*  --  8.6* 7.9* 7.8*  MG  --   < > 2.6* 1.8 1.7  PHOS 4.3  --   --  3.9  3.9 3.8  < > = values in this interval not displayed. Sepsis Markers No results for input(s): LATICACIDVEN, PROCALCITON, O2SATVEN in the last 168 hours.   ABG  Recent Labs Lab 07/04/15 1226  PHART 7.428  PCO2ART 33.5*  PO2ART 378*     Liver Enzymes  Recent Labs Lab 07/04/15 1025 07/05/15 0335 07/06/15 0345  AST 27  --   --   ALT 10*  --   --   ALKPHOS 89  --   --   BILITOT 0.4  --   --   ALBUMIN 2.7* 2.2* 2.1*   Cardiac Enzymes  Recent Labs Lab 07/04/15 1025 07/04/15 1620 07/04/15 2220  TROPONINI <0.03 0.04* <0.03     Glucose  Recent Labs Lab 07/05/15 1152 07/05/15 1537 07/05/15 2008 07/05/15 2354 07/06/15 0325 07/06/15 0743  GLUCAP 79 76 82 86 100* 106*    Imaging Dg Chest Port 1 View  07/06/2015   CLINICAL DATA:  Acute respiratory failure  EXAM: PORTABLE CHEST 1 VIEW  COMPARISON:  Yesterday  FINDINGS: Endotracheal tube tip between the clavicular heads and carina. The orogastric tube tip is not visualized but  likely at the pylorus.  Persistent bibasilar airspace opacity with small pleural effusions. No air leak or collapse. Normal heart size and aortic contours.  IMPRESSION: 1. Stable positioning of endotracheal and orogastric tubes. 2. Unchanged bibasilar atelectasis or pneumonia with small pleural effusions.   Electronically Signed   By: Marnee Spring M.D.   On: 07/06/2015 05:46     ASSESSMENT / PLAN:  PULMONARY OETT 9/14 >> 9/19, 9/27 >>  A: Acute Ventilator Dependent Respiratory Failure - prior acute hypoxic failure secondary to HCAP with suspected aspiration.  Recurrent respiratory failure 9/27 in the setting of cardiac arrest.  HCAP - ESBL Klebsiella Improving Resp Secretions P:   MV support, 8 cc/kg Wean PEEP / FiO2 for sats > 90% Daily SBT / WUA Would like to develop a plan regarding reintubation before liberation from the vent Aspiration precautions  Robinul PRN for increased secretions  Palliative care following for goals of care  CARDIOVASCULAR A: Asystolic Arrest - 9/27 Hypertension  H/O RBBB Sinus Tachycardia  > resolved P:  ICU monitoring  Continue IV metoprolol Clonidine 0.2 mg patch  RENAL GU A:  Acute Renal Failure - Resolved Hypomagnesemia  H/O BPH Non-anion gap acidosis - due to volume resuscitation, resolved.  P:   Monitor BMET and UOP Replace electrolytes as needed Flomax  Free water 100 ml per tube Q8  GASTROINTESTINAL A:   Upper GIB - EGD with erosions GEJ, likely prior source? Mallory weiss?, no active bleeding; no acute bleeding (9/19)  Dysphagia  Hiatal Hernia  P:   Trend CBC daily Cont PPI per GI-Protonix IV twice a day; GI has signed off  Resume TF 9/28 D5NS with 40 mEq KCL at 50 ml/hr SLP following.  Has had difficulty with swallowing and high risk for aspiration.  He may need PEG or J-tube placed for chronic feeding.  Although, this will not reduce his risk of aspiration.   HEMATOLOGIC A:   Anemia - secondary to Upper GIB. No  further signs of bleeding but Hgb trending down Leukocytosis - resolved P:  Trend Hgb daily w/ CBC  Transfuse for Hgb < 7gm/dL SCDs Consider start SQ Heparin?? Hx GIB?  INFECTIOUS A:   HCAP - ESBL Klebsiella.   P:   Resp Ctx 9/14 >> ESBL Klebsiella  Primaxin 9/16 >> 9/29 Unasyn 9/13 >> 9/16  D14/14 abx on 9/29 Monitor fever curve / WBC  ENDOCRINE A:   Hypoglycemia - Resolved P:   CBG every 4 hours  NEUROLOGIC A:   H/O Cerebral Palsy H/O Mental Disability  H/O Seizure Disorder P:  Continue Vimpat Baclofen  PRN fentanyl / versed for sedation / pain Goal RASS: 0   FAMILY  - Updates: None at bedside, ward of state.   - Inter-disciplinary family meet or Palliative Care meeting due by:  Updated HCPOA 9/27. Planned meeting 9/28 with Palliative Care.  Paper work sent to the hospital for DNR initiation. Must be filled out by MD. Avon Gully) Lissa Merlin [phone].   GLOBAL:  Requested assistance from the Ethics Committee.  The patient is alert on mechanical ventilation.  He unfortunately is in a difficult situation with recurrent aspiration, difficulty managing oral secretions and respiratory failure.  This is the second intubation during this admission.  Currently, he does not meet criteria for extubation.  If/when he does meet criteria, should we reintubate him?  Prior to 9/27 arrest, he had requested a [employer] which leads me to believe he does have joy in eating.  I am concerned if we take the ability for him to eat away (NPO with J-tube or PEG), this would cause a significant decrease in his quality of life.  Recurrent intubations / prolonged intubation will only further contribute to weakness and dysphagia.  We have discussed these issues with his HCPOA and have a packet of paper work to address.  This includes a second, non-attending MD opinion.  Appreciate the assistance of the Ethics Committee.       Canary Brim, NP-C [facility] Pgr:  [phone] or if no answer [phone] 07/06/2015, 10:32 AM

## 2015-07-07 ENCOUNTER — Encounter: Payer: Self-pay | Admitting: Pulmonary Disease

## 2015-07-07 ENCOUNTER — Inpatient Hospital Stay (HOSPITAL_COMMUNITY): Payer: Medicare Other

## 2015-07-07 LAB — GLUCOSE, CAPILLARY
GLUCOSE-CAPILLARY: 84 mg/dL (ref 65–99)
GLUCOSE-CAPILLARY: 85 mg/dL (ref 65–99)
GLUCOSE-CAPILLARY: 93 mg/dL (ref 65–99)
GLUCOSE-CAPILLARY: 99 mg/dL (ref 65–99)
Glucose-Capillary: 80 mg/dL (ref 65–99)
Glucose-Capillary: 105 mg/dL — ABNORMAL HIGH (ref 65–99)

## 2015-07-07 LAB — RENAL FUNCTION PANEL
ALBUMIN: 2 g/dL — AB (ref 3.5–5.0)
Anion gap: 6 (ref 5–15)
BUN: 13 mg/dL (ref 6–20)
CALCIUM: 8 mg/dL — AB (ref 8.9–10.3)
CHLORIDE: 111 mmol/L (ref 101–111)
CO2: 22 mmol/L (ref 22–32)
CREATININE: 1.01 mg/dL (ref 0.61–1.24)
Glucose, Bld: 108 mg/dL — ABNORMAL HIGH (ref 65–99)
Phosphorus: 3.4 mg/dL (ref 2.5–4.6)
Potassium: 4.3 mmol/L (ref 3.5–5.1)
SODIUM: 139 mmol/L (ref 135–145)

## 2015-07-07 LAB — CBC
HCT: 25.4 % — ABNORMAL LOW (ref 39.0–52.0)
Hemoglobin: 8.3 g/dL — ABNORMAL LOW (ref 13.0–17.0)
MCH: 31.8 pg (ref 26.0–34.0)
MCHC: 32.7 g/dL (ref 30.0–36.0)
MCV: 97.3 fL (ref 78.0–100.0)
PLATELETS: 537 10*3/uL — AB (ref 150–400)
RBC: 2.61 MIL/uL — AB (ref 4.22–5.81)
RDW: 13.6 % (ref 11.5–15.5)
WBC: 6.2 10*3/uL (ref 4.0–10.5)

## 2015-07-07 LAB — MAGNESIUM: MAGNESIUM: 1.7 mg/dL (ref 1.7–2.4)

## 2015-07-07 NOTE — Progress Notes (Signed)
Fentanyl drip dc'd 130 ml wasted (10 mcg/ml) witness Roney Jaffe RN

## 2015-07-07 NOTE — Progress Notes (Signed)
This NP will f/u with guardian next week regarding plan of care for Mr Albus.  Lorinda Creed NP

## 2015-07-07 NOTE — Progress Notes (Signed)
Nutrition Follow-up  DOCUMENTATION CODES:   Not applicable  INTERVENTION:  - TF recommendations if pt remains intubated: Jevity 1.2 @ 55 mL/hr with 30 mL Prostat once/day which will provide 1684 kcal, 88 grams protein, and 1065 mL free water. - TF recommendations for s/p extubation: Jevity 1.2 @ 55 mL/hr which will provide 1584 kcal, 73 grams protein, and 1065 mL free water. - RD will continue to monitor for needs  NUTRITION DIAGNOSIS:   Inadequate oral intake related to inability to eat as evidenced by NPO status. -ongoing  GOAL:   Patient will meet greater than or equal to 90% of their needs -unmet even with TF  MONITOR:   Vent status, Weight trends, Labs, I & O's  ASSESSMENT:   64 yo WM with existing CP/MR with a history of HTN, erosive esophagitis, previous GI bleed who presented to [facility] from SNF coffee ground emesis and inability to protect airway and maintain adequate O2 saturations. He was intubated per EDP, started on PPI drip and GI was consulted. He is a ward of state and is a full code. State appt guardian is Moises Blood DDS [phone]. He will be admitted to ICU under PCCM service.  9/30 Patient is currently intubated on ventilator support MV: 9.9 L/min Temp (24hrs), Avg:98.4 F (36.9 C), Min:98 F (36.7 C), Max:99.1 F (37.3 C)  Propofol: none  Per RT and NP, begin weaning for vent and attempt extubation later today. Needs have been calculated based on current vent settings as well as needs s/p extubation (needs on vent are the first values and needs for s/p extubation are after the semi-colon).  Pt is currently receiving Vital High Protein @ 40 mL/hr with 100 mL free water TID which is providing 960 kcal (59% intubated kcal needs), 84 grams protein (100% needs), and 1102 mL free water.  TF recommendations outlined above should pt remain on vent versus s/p extubation. NP note from yesterday indicates that pt may need PEG or J-tube for long-term TF; RD in  agreement with this plan based on medical course, PMH.   Medications reviewed. Labs reviewed; CBGs: 80-108 mg/dL, Ca: 8 mg/dL.    9/27 - Panda was removed yesterday and pt advanced to CLD, nectar-thick.  - Pt coded this AM and now intubated with OGT in place. Needs re-estimated based on this. - Patient is currently intubated on ventilator support - MV: 9.6 L/min; Propofol: none  9/22 - Pt seen for new consult for RD to initiate and manage TF. - Pt is very high refeeding risk due to no nutrition for at least 9 days.  - Monitor magnesium, potassium, and phosphorus daily for at least 3 days, MD to replete as needed, as pt is at risk for refeeding syndrome given prolonged period of no nutrition. - Will order Jevity 1.2 @ 15 mL/hr and advance by 10 mL Q12h to goal rate of 55 mL/hr which will provide 1584 kcal, 73 grams protein, and 1065 mL free water. Will also order 100 mL free water Q8h to add additional 300 mL water/day.  9/16 - Patient is currently intubated on ventilator support - MV: 11.5 L/min - Propofol: 7.6 ml/hr (201 kcal) - Needs have been re-estimated based on current MV and medical course.  - Pt without nutrition support but NGT in place.  - Spoke with NP this AM who states weaning of vent in progress with plan to turn off Propofol to trial extubation; he is hopeful if this occurs diet advancement to occur later  today versus tomorrow but requests TF recommendations in case needed. Unsure of pt's baseline diet texture tolerance.  9/14 - Patient is currently intubated on ventilator support with OGT in place - MV: 13.9 L/min; Propofol: 13.4 ml/hr (354 kcal) -RN states pt does not have PEG or G-J tube from PTA.  - Per weight hx review, pt has gained 25 lbs since 10/2014. Mild muscle wasting noted to clavicle area and lower legs; no fat wasting. - GI note from this AM indicates no active GIB overnight; will monitor for ability to initiate TF if pt to remain intubated >24  hours. - Recommended Vital AF 1.2 @ 55 mL/hr which will provide, in combination with current Propofol rate, 1938 kcal, 99 grams protein, and 1070 mL free water.    Diet Order:  Diet NPO time specified  Skin:  Reviewed, no issues  Last BM:  9/29  Height:   Ht Readings from Last 1 Encounters:  06/20/15  (1.702 m)    Weight:   Wt Readings from Last 1 Encounters:  07/07/15 135 lb 5.8 oz (61.4 kg)    Ideal Body Weight:  67.27 kg (kg)  BMI:  Body mass index is 21.2 kg/(m^2).  Estimated Nutritional Needs:   Kcal:  1632; 4782-9562  Protein:  74-92 grams; 60-70 grams  Fluid:  1.5L/day  EDUCATION NEEDS:   No education needs identified at this time     Trenton Gammon, RD, LDN Inpatient Clinical Dietitian Pager # [phone] After hours/weekend pager # [phone]

## 2015-07-07 NOTE — Progress Notes (Signed)
   07/07/15 1000  Clinical Encounter Type  Visited With Patient  Visit Type Follow-up;Spiritual support;Social support  Referral From Palliative care team  Consult/Referral To Chaplain  Spiritual Encounters  Spiritual Needs Prayer;Emotional  CH responded to consult; CH follow-up from previous visit; CH observed that pt was sitting up and responsive to yes/no questions watching TV; pt motioned towards TV and also for [facility] to enter room; pt seemed to recognize CH from previous visit; pt unable to talk but does communicate on simple level to yes/no questions; CH will continue to visit pt and offer support; 10:31 AM Erline Levine

## 2015-07-07 NOTE — Consult Note (Signed)
Follow-up; CH aided in facilitation of POA; POA for [facility] still needs additional signature of guardian in the presence of two witness and notary.  Paperwork in pt chart.

## 2015-07-07 NOTE — Progress Notes (Signed)
PULMONARY / CRITICAL CARE MEDICINE   Name: Colin Bradley MRN: [id number] DOB: Jul 17, 1951    ADMISSION DATE:  06/20/2015   REFERRING MD :  EDP  CHIEF COMPLAINT:  GIB  INITIAL PRESENTATION: 64 year old male with known cerebral palsy and intellectual disability is a ward of the state. Presented with coffee-ground emesis on 9/13. Intubated for worsening hypoxic respiratory failure and evidence of aspiration pneumonia on chest x-ray imaging post-intubation.  STUDIES:  Portable CXR 9/13 -  right sided mid and lower lung opacification. Renal US 9/13 - small right renal cyst. Cholelithiasis noted. No hydronephrosis.  SIGNIFICANT EVENTS: 9/13  Intubated & admitted Upper GIB 9/13  EGD w/ hiatal hernia but no source of active bleeding 9/14  hyperchloremic acidosis  9/15  tachycardic, lopressor restarted. Continued on rx for pneumonia  9/16  WBC trending down, hgb stable, still w/ NAG metabolic acidosis.  9/18  hgb down w/o active bleeding. Patient more awake. 9/19  Extubated  9/20  To TRH 9/27  Asystolic arrest, intubated on floor.  Required ~1 minute of CPR with ROSC.  Tx back to ICU 9/28  Pt more alert, not tolerating weaning due to low volumes   SUBJECTIVE:   RT pt alert, watching TV.  Weaning on 10/5.  Attempted 0/0 with Vt of 500-1.2L without distress   VITAL SIGNS: Temp:  [98 F (36.7 C)-99.1 F (37.3 C)] 98 F (36.7 C) (09/30 0800) Pulse Rate:  [57-84] 64 (09/30 1000) Resp:  [14-21] 16 (09/30 1000) BP: (97-172)/(35-159) 123/68 mmHg (09/30 1000) SpO2:  [94 %-100 %] 100 % (09/30 1000) FiO2 (%):  [30 %] 30 % (09/30 0801) Weight:  [135 lb 5.8 oz (61.4 kg)] 135 lb 5.8 oz (61.4 kg) (09/30 0422)   HEMODYNAMICS:     VENTILATOR SETTINGS: Vent Mode:  [-] PRVC FiO2 (%):  [30 %] 30 % Set Rate:  [18 bmp] 18 bmp Vt Set:  [530 mL] 530 mL PEEP:  [5 cmH20] 5 cmH20 Plateau Pressure:  [15 cmH20-24 cmH20] 23 cmH20   INTAKE / OUTPUT:  Intake/Output Summary (Last 24 hours) at  07/07/15 1038 Last data filed at 07/07/15 0800  Gross per 24 hour  Intake 2592.13 ml  Output      4 ml  Net 2588.13 ml    PHYSICAL EXAMINATION: General : adult male on mechanical vent, NAD  HENT: NCAT ETT in place PULM:  Even/non-labored, lungs bilaterally coarse CV: RRR, no mgr GI: BS+, soft, nontender MSK: no obvious deformities Neuro: awake/alert, nods to questions appropriately, moves upper extremities, adjusts ETT  LABS:  CBC  Recent Labs Lab 07/05/15 0335 07/06/15 0345 07/07/15 0410  WBC 7.5 8.1 6.2  HGB 8.7* 8.2* 8.3*  HCT 25.9* 24.7* 25.4*  PLT 579* 557* 537*   Coag's No results for input(s): APTT, INR in the last 168 hours.   BMET  Recent Labs Lab 07/05/15 0335 07/06/15 0345 07/07/15 0400  NA 136 136 139  K 4.0 4.1 4.3  CL 108 110 111  CO2 21* 19* 22  BUN CREATININE 0.98 1.04 1.01  GLUCOSE 75 114* 108*   Electrolytes  Recent Labs Lab 07/05/15 0335 07/06/15 0345 07/07/15 0400 07/07/15 0410  CALCIUM 7.9* 7.8* 8.0*  --   MG 1.8 1.7  --  1.7  PHOS 3.9  3.9 3.8 3.4  --    Sepsis Markers No results for input(s): LATICACIDVEN, PROCALCITON, O2SATVEN in the last 168 hours.   ABG  Recent Labs Lab 07/04/15 1226  PHART 7.428  PCO2ART 33.5*  PO2ART 378*     Liver Enzymes  Recent Labs Lab 07/04/15 1025 07/05/15 0335 07/06/15 0345 07/07/15 0400  AST 27  --   --   --   ALT 10*  --   --   --   ALKPHOS 89  --   --   --   BILITOT 0.4  --   --   --   ALBUMIN 2.7* 2.2* 2.1* 2.0*   Cardiac Enzymes  Recent Labs Lab 07/04/15 1025 07/04/15 1620 07/04/15 2220  TROPONINI <0.03 0.04* <0.03     Glucose  Recent Labs Lab 07/06/15 1138 07/06/15 1550 07/06/15 1945 07/06/15 2341 07/07/15 0301 07/07/15 0828  GLUCAP 108* 95 97 93 99 80    Imaging Dg Chest Port 1 View  07/07/2015   CLINICAL DATA:  Acute respiratory failure  EXAM: PORTABLE CHEST 1 VIEW  COMPARISON:  Yesterday  FINDINGS: Endotracheal tube tip between the  clavicular heads and carina. Orogastric tube reaches the stomach at least.  Unchanged indistinct bibasilar opacities. Stable interstitial coarsening. No air leak.  Stable normal heart size and aortic contour.  IMPRESSION: 1. Stable positioning of endotracheal and orogastric tubes. 2. Unchanged bibasilar atelectasis or pneumonia.   Electronically Signed   By: Marnee Spring M.D.   On: 07/07/2015 06:31     ASSESSMENT / PLAN:  PULMONARY OETT 9/14 >> 9/19, 9/27 >>  A: Acute Ventilator Dependent Respiratory Failure - prior acute hypoxic failure secondary to HCAP with suspected aspiration.  Recurrent respiratory failure 9/27 in the setting of cardiac arrest.  HCAP - ESBL Klebsiella Improving Resp Secretions P:   MV support, 8 cc/kg Wean PEEP / FiO2 for sats > 90% Daily SBT / WUA >> meets criteria for extubation 9/30, will trial liberation from vent.  Tolerated 0/0 SBT Aspiration precautions  Strict NPO Robinul PRN for increased secretions  Palliative care following for goals of care  CARDIOVASCULAR A: Asystolic Arrest - 9/27 Hypertension  H/O RBBB Sinus Tachycardia  > resolved P:  ICU monitoring  Continue IV metoprolol Clonidine 0.2 mg patch  RENAL GU A:  Acute Renal Failure - Resolved Hypomagnesemia  H/O BPH Non-anion gap acidosis - due to volume resuscitation, resolved.  P:   Monitor BMET and UOP Replace electrolytes as needed Flomax  NPO  GASTROINTESTINAL A:   Upper GIB - EGD with erosions GEJ, likely prior source? Mallory weiss?, no active bleeding; no acute bleeding (9/19)  Dysphagia  Hiatal Hernia  P:   Trend CBC daily Cont PPI per GI-Protonix IV twice a day; GI has signed off  Hold TF D5NS with 40 mEq KCL at 50 ml/hr SLP following.  Has had difficulty with swallowing and high risk for aspiration.  Do not recommend PEG / J-tube placement   HEMATOLOGIC A:   Anemia - secondary to Upper GIB. No further signs of bleeding but Hgb trending down Leukocytosis -  resolved P:  Trend Hgb daily w/ CBC  Transfuse for Hgb < 7gm/dL SCDs Consider start SQ Heparin?? Hx GIB?, monitor for now  INFECTIOUS A:   HCAP - ESBL Klebsiella.   P:   Resp Ctx 9/14 >> ESBL Klebsiella  Primaxin 9/16 >> 9/29 Unasyn 9/13 >> 9/16  D14/14 abx on 9/29 Monitor fever curve / WBC  ENDOCRINE A:   Hypoglycemia - Resolved P:   CBG every 4 hours  NEUROLOGIC A:   H/O Cerebral Palsy H/O Mental Disability  H/O Seizure Disorder P:   Continue Vimpat Baclofen  Hold sedation am 9/30 Goal RASS: 0   FAMILY  - Updates: None at bedside, ward of state.   - Inter-disciplinary family meet or Palliative Care meeting due by:  Updated HCPOA 9/27. Planned meeting 9/28 with Palliative Care.  Paper work sent to the hospital for DNR initiation. Must be filled out by MD. Avon Gully) Lissa Merlin [phone].   GLOBAL:  Requested assistance from the Ethics Committee (they recommended Palliative Care to assist).  The patient is alert on mechanical ventilation.  He unfortunately is in a difficult situation with recurrent aspiration, difficulty managing oral secretions and respiratory failure.  This is the second intubation during this admission.  Currently, he does not meet criteria for extubation.  If/when he does meet criteria, the care team feels he should not be re-intubated. Prior to 9/27 arrest, he had requested a [employer] which leads me to believe he does have joy in eating.  I am concerned if we take the ability for him to eat away (NPO with J-tube or PEG), this would cause a significant decrease in his quality of life.  Recurrent intubations / prolonged intubation will only further contribute to weakness and dysphagia.  We have discussed these issues with his HCPOA are working with her to address code status.  This  includes a second, non-attending MD opinion.  Appreciate the assistance of the Palliative Care service.       Canary Brim, NP-C [facility] Pgr: [phone] or if no answer [phone] 07/07/2015, 10:38 AM

## 2015-07-07 NOTE — Plan of Care (Signed)
Problem: Phase II Progression Outcomes Goal: Date pt extubated/weaned off vent Outcome: Completed/Met Date Met:  07/07/15 Extubated today 07/07/15 at 1145

## 2015-07-07 NOTE — Procedures (Signed)
Extubation Procedure Note  Patient Details:   Name: Colin Bradley DOB: Dec 27, 1950 MRN: [id number]   Airway Documentation:     Evaluation  O2 sats: 99 Complications: none Patient tolerated procedure well. Bilateral Breath Sounds: Clear Suctioning: Airway Pt is not speaking  Per CCM order, pt extubated, placed on 3L nasal cannula.  Pt tolerated procedure well.  .07/07/2015, 12:01 PM

## 2015-07-07 NOTE — Progress Notes (Signed)
CSW has faxed requested signed / notarized documents to pt's guardian, Lissa Merlin, as requested.   Cori Razor LCSW [phone]

## 2015-07-07 NOTE — Progress Notes (Signed)
Date:  Sept. 30, 2016 U.R. performed for needs and level of care. Will continue to follow for Case Management needs.  Rhonda Davis, RN, BSN, CCM   [phone] 

## 2015-07-08 ENCOUNTER — Inpatient Hospital Stay (HOSPITAL_COMMUNITY): Payer: Medicare Other

## 2015-07-08 DIAGNOSIS — Z7189 Other specified counseling: Secondary | ICD-10-CM

## 2015-07-08 DIAGNOSIS — R1314 Dysphagia, pharyngoesophageal phase: Secondary | ICD-10-CM

## 2015-07-08 DIAGNOSIS — D649 Anemia, unspecified: Secondary | ICD-10-CM

## 2015-07-08 LAB — CBC
HCT: 24.7 % — ABNORMAL LOW (ref 39.0–52.0)
Hemoglobin: 8 g/dL — ABNORMAL LOW (ref 13.0–17.0)
MCH: 31.3 pg (ref 26.0–34.0)
MCHC: 32.4 g/dL (ref 30.0–36.0)
MCV: 96.5 fL (ref 78.0–100.0)
PLATELETS: 500 10*3/uL — AB (ref 150–400)
RBC: 2.56 MIL/uL — ABNORMAL LOW (ref 4.22–5.81)
RDW: 13.5 % (ref 11.5–15.5)
WBC: 6.3 10*3/uL (ref 4.0–10.5)

## 2015-07-08 LAB — RENAL FUNCTION PANEL
ALBUMIN: 2.1 g/dL — AB (ref 3.5–5.0)
Anion gap: 4 — ABNORMAL LOW (ref 5–15)
BUN: 10 mg/dL (ref 6–20)
CHLORIDE: 115 mmol/L — AB (ref 101–111)
CO2: 20 mmol/L — ABNORMAL LOW (ref 22–32)
CREATININE: 0.76 mg/dL (ref 0.61–1.24)
Calcium: 7.9 mg/dL — ABNORMAL LOW (ref 8.9–10.3)
GFR calc Af Amer: >60 mL/min (ref >60)
GLUCOSE: 95 mg/dL (ref 65–99)
PHOSPHORUS: 3.7 mg/dL (ref 2.5–4.6)
Potassium: 4.1 mmol/L (ref 3.5–5.1)
Sodium: 139 mmol/L (ref 135–145)

## 2015-07-08 LAB — BASIC METABOLIC PANEL
Anion gap: 8 (ref 5–15)
BUN: 9 mg/dL (ref 6–20)
CHLORIDE: 113 mmol/L — AB (ref 101–111)
CO2: 19 mmol/L — ABNORMAL LOW (ref 22–32)
CREATININE: 0.77 mg/dL (ref 0.61–1.24)
Calcium: 8.1 mg/dL — ABNORMAL LOW (ref 8.9–10.3)
GFR calc Af Amer: >60 mL/min (ref >60)
GFR calc non Af Amer: >60 mL/min (ref >60)
GLUCOSE: 98 mg/dL (ref 65–99)
Potassium: 4.1 mmol/L (ref 3.5–5.1)
Sodium: 140 mmol/L (ref 135–145)

## 2015-07-08 LAB — GLUCOSE, CAPILLARY
Glucose-Capillary: 95 mg/dL (ref 65–99)
Glucose-Capillary: 95 mg/dL (ref 65–99)
Glucose-Capillary: 92 mg/dL (ref 65–99)

## 2015-07-08 LAB — MAGNESIUM: MAGNESIUM: 1.6 mg/dL — AB (ref 1.7–2.4)

## 2015-07-08 MED ORDER — CHLORHEXIDINE GLUCONATE 0.12 % MT SOLN
OROMUCOSAL | Status: AC
  Administered 2015-07-08: 15 mL via OROMUCOSAL
  Filled 2015-07-08: qty 15

## 2015-07-08 MED ORDER — MAGNESIUM SULFATE 2 GM/50ML IV SOLN
2.0000 g | Freq: Once | INTRAVENOUS | Status: AC
  Administered 2015-07-08: 2 g via INTRAVENOUS
  Filled 2015-07-08: qty 50

## 2015-07-08 NOTE — Progress Notes (Signed)
eLink Physician-Brief Progress Note Patient Name: Colin Bradley DOB: 1951-01-12 MRN: [id number]   Date of Service  07/08/2015  HPI/Events of Note  Hypomag  eICU Interventions  Mag replaced     Intervention Category Intermediate Interventions: Electrolyte abnormality - evaluation and management  Azyiah Bo 07/08/2015, 4:50 AM

## 2015-07-08 NOTE — Progress Notes (Signed)
PULMONARY / CRITICAL CARE MEDICINE   Name: Colin Bradley MRN: [id number] DOB: [birth year]    ADMISSION DATE:  06/20/2015   REFERRING MD :  EDP  CHIEF COMPLAINT:  GIB  INITIAL PRESENTATION: 64 year old male with known cerebral palsy and intellectual disability is a ward of the state. Presented with coffee-ground emesis on 9/13. Intubated for worsening hypoxic respiratory failure and evidence of aspiration pneumonia on chest x-ray imaging post-intubation.  STUDIES:  Portable CXR 9/13 -  right sided mid and lower lung opacification. Renal US 9/13 - small right renal cyst. Cholelithiasis noted. No hydronephrosis.  SIGNIFICANT EVENTS: 9/13  Intubated & admitted Upper GIB 9/13  EGD w/ hiatal hernia but no source of active bleeding 9/14  hyperchloremic acidosis  9/15  tachycardic, lopressor restarted. Continued on rx for pneumonia  9/16  WBC trending down, hgb stable, still w/ NAG metabolic acidosis.  9/18  hgb down w/o active bleeding. Patient more awake. 9/19  Extubated  9/20  To TRH 9/27  Asystolic arrest, intubated on floor.  Required ~1 minute of CPR with ROSC.  Tx back to ICU 9/28  Pt more alert, not tolerating weaning due to low volumes 9/30:   RT pt alert, watching TV.  Weaning on 10/5.  Attempted 0/0 with Vt of 500-1.2L without distress   SUBJECTIVE/OVERNIGHT/INTERVAL HX 07/08/15: reamins extubated. RN says now DNR. No major events overnight other than mag repletion. Pall care plans to meet guardian after 07/10/15  VITAL SIGNS: Temp:  [97.8 F (36.6 C)-98.8 F (37.1 C)] 98.1 F (36.7 C) (10/01 0402) Pulse Rate:  [58-77] 63 (10/01 0800) Resp:  [3-21] 17 (10/01 0800) BP: (102-162)/(36-95) 103/38 mmHg (10/01 0800) SpO2:  [95 %-100 %] 95 % (10/01 0800) Weight:  [60.2 kg (132 lb 11.5 oz)] 60.2 kg (132 lb 11.5 oz) (10/01 0400)  VENTILATOR SETTINGS:     INTAKE / OUTPUT:  Intake/Output Summary (Last 24 hours) at 07/08/15 0811 Last data filed at 07/08/15 0700  Gross per 24  hour  Intake 1469.33 ml  Output      0 ml  Net 1469.33 ml    PHYSICAL EXAMINATION: General : adult male  vent, NAD . Deconditioned HENT: NCAT ETT in place PULM:  Even/non-labored, lungs bilaterally coarse CV: RRR, no mgr GI: BS+, soft, nontender MSK: no obvious deformities Neuro: awake/alert, nods to questions appropriately, moves upper extremities, adjusts ETT  LABS: PULMONARY  Recent Labs Lab 07/04/15 1226  PHART 7.428  PCO2ART 33.5*  PO2ART 378*  HCO3 21.8  TCO2 19.5  O2SAT 99.9    CBC  Recent Labs Lab 07/06/15 0345 07/07/15 0410 07/08/15 0403  HGB 8.2* 8.3* 8.0*  HCT 24.7* 25.4* 24.7*  WBC 8.1 6.2 6.3  PLT 557* 537* 500*    COAGULATION No results for input(s): INR in the last 168 hours.  CARDIAC   Recent Labs Lab 07/04/15 1025 07/04/15 1620 07/04/15 2220  TROPONINI <0.03 0.04* <0.03   No results for input(s): PROBNP in the last 168 hours.   CHEMISTRY  Recent Labs Lab 07/04/15 0450  07/04/15 1025 07/05/15 0335 07/06/15 0345 07/07/15 0400 07/07/15 0410 07/08/15 0403 07/08/15 0404  NA 138  --  137 136 136 139  --  140 139  K 4.3  --  4.6 4.0 4.1 4.3  --  4.1 4.1  CL 109  --  106 108 110 111  --  113* 115*  CO2 22  --  20* 21* 19* 22  --  19* 20*  GLUCOSE 88  --  106* 75 114* 108*  --  98 95  BUN 7  --  --  9 10  CREATININE 0.89  --  0.94 0.98 1.04 1.01  --  0.77 0.76  CALCIUM 8.3*  --  8.6* 7.9* 7.8* 8.0*  --  8.1* 7.9*  MG  --   < > 2.6* 1.8 1.7  --  1.7 1.6*  --   PHOS 4.3  --   --  3.9  3.9 3.8 3.4  --   --  3.7  < > = values in this interval not displayed. Estimated Creatinine Clearance: 78.4 mL/min (by C-G formula based on Cr of 0.76).   LIVER  Recent Labs Lab 07/04/15 1025 07/05/15 0335 07/06/15 0345 07/07/15 0400 07/08/15 0404  AST 27  --   --   --   --   ALT 10*  --   --   --   --   ALKPHOS 89  --   --   --   --   BILITOT 0.4  --   --   --   --   PROT 6.8  --   --   --   --   ALBUMIN 2.7* 2.2* 2.1*  2.0* 2.1*     INFECTIOUS No results for input(s): LATICACIDVEN, PROCALCITON in the last 168 hours.   ENDOCRINE CBG (last 3)   Recent Labs  07/07/15 1730 07/07/15 2032 07/08/15 0334  GLUCAP 84 85 95         IMAGING x48h  - image(s) personally visualized  -   highlighted in bold Dg Chest Port 1 View  07/08/2015   CLINICAL DATA:  Acute respiratory failure  EXAM: PORTABLE CHEST 1 VIEW  COMPARISON:  Radiograph 07/07/2015  FINDINGS: Stable cardiac silhouette. Interval removal of NG tube. The bilateral pleural effusions and basilar atelectasis. Increased density at the RIGHT lung base.  IMPRESSION: 1. Increasing density RIGHT lung base representing infiltrate or edema. 2. Stable bibasilar atelectasis and effusions.   Electronically Signed   By: Genevive Bi M.D.   On: 07/08/2015 07:25   Dg Chest Port 1 View  07/07/2015   CLINICAL DATA:  Acute respiratory failure  EXAM: PORTABLE CHEST 1 VIEW  COMPARISON:  Yesterday  FINDINGS: Endotracheal tube tip between the clavicular heads and carina. Orogastric tube reaches the stomach at least.  Unchanged indistinct bibasilar opacities. Stable interstitial coarsening. No air leak.  Stable normal heart size and aortic contour.  IMPRESSION: 1. Stable positioning of endotracheal and orogastric tubes. 2. Unchanged bibasilar atelectasis or pneumonia.   Electronically Signed   By: Marnee Spring M.D.   On: 07/07/2015 06:31         ASSESSMENT / PLAN:  PULMONARY OETT 9/14 >> 9/19, 9/27 >> 9/30 A: Acute Ventilator Dependent Respiratory Failure - prior acute hypoxic failure secondary to HCAP with suspected aspiration.  Recurrent respiratory failure 9/27 in the setting of cardiac arrest.  HCAP - ESBL Klebsiella Improving Resp Secretions   - remains extugated  P:   Aspiration precautions  Strict NPO Robinul PRN for increased secretions  Palliative care following for goals of care DNI now  CARDIOVASCULAR A: Asystolic Arrest -  9/27 Hypertension  H/O RBBB Sinus Tachycardia  > resolved Nil acute  P:  ICU monitoring  Continue IV metoprolol Clonidine 0.2 mg patch  RENAL GU A:  Acute Renal Failure - Resolved Hypomagnesemia  H/O BPH Non-anion gap acidosis - due to volume resuscitation, resolved.    -  mag repleted by eMD P:   Monitor BMET and UOP Replace electrolytes as needed Flomax  NPO  GASTROINTESTINAL A:   Upper GIB - EGD with erosions GEJ, likely prior source? Mallory weiss?, no active bleeding; no acute bleeding (9/19)  Dysphagia  Hiatal Hernia   - nil acute  P:   Trend CBC daily Cont PPI per GI-Protonix IV twice a day; GI has signed off  Hold TF D5NS with 40 mEq KCL at 50 ml/hr SLP following.  Has had difficulty with swallowing and high risk for aspiration.  Do not recommend PEG / J-tube placement   HEMATOLOGIC A:   Anemia - secondary to Upper GIB. No further signs of bleeding but Hgb trending down Leukocytosis - resolved P:  Trend Hgb daily w/ CBC  Transfuse for Hgb < 7gm/dL SCDs Consider start SQ Heparin?? Hx GIB?, monitor for now  INFECTIOUS A:   HCAP - ESBL Klebsiella.   P:   Resp Ctx 9/14 >> ESBL Klebsiella  Primaxin 9/16 >> 9/29 Unasyn 9/13 >> 9/16  D14/14 abx on 9/29 Monitor fever curve / WBC  ENDOCRINE A:   Hypoglycemia - Resolved P:   CBG every 4 hours  NEUROLOGIC A:   H/O Cerebral Palsy H/O Mental Disability  H/O Seizure Disorder P:   Continue Vimpat Baclofen  Hold sedation am 9/30 Goal RASS: 0   FAMILY  - Updates: None at bedside, ward of state.   - Inter-disciplinary family meet or Palliative Care meeting due by:  Updated HCPOA 9/27. Planned meeting 9/28 with Palliative Care.  Paper work sent to the hospital for DNR initiation. Must be filled out by MD. Avon Gully) Lissa Merlin [phone].   GLOBAL: - note by 07/07/15 PCCM Team:  Requested assistance from the Ethics Committee (they recommended Palliative Care to assist).  The patient is alert  on mechanical ventilation.  He unfortunately is in a difficult situation with recurrent aspiration, difficulty managing oral secretions and respiratory failure.  This is the second intubation during this admission.  Currently, he does not meet criteria for extubation.  If/when he does meet criteria, the care team feels he should not be re-intubated. Prior to 9/27 arrest, he had requested a [employer] which leads me to believe he does have joy in eating.  I am concerned if we take the ability for him to eat away (NPO with J-tube or PEG), this would cause a significant decrease in his quality of life.  Recurrent intubations / prolonged intubation will only further contribute to weakness and dysphagia.  We have discussed these issues with his HCPOA are working with her to address code status.  This  includes a second, non-attending MD opinion.  Appreciate the assistance of the Palliative Care service.    Move to SDU status 07/08/2015  and PCCM off from 07/09/15. TRH primary     Dr. Kalman Shan, M.D., [facility]C.P Pulmonary and Critical Care Medicine Staff Physician [facility] Pager: [phone], If no answer or between  15:00h - 7:00h: call [phone]  07/08/2015 8:20 AM

## 2015-07-09 ENCOUNTER — Inpatient Hospital Stay (HOSPITAL_COMMUNITY): Payer: Medicare Other

## 2015-07-09 DIAGNOSIS — J690 Pneumonitis due to inhalation of food and vomit: Secondary | ICD-10-CM

## 2015-07-09 LAB — GLUCOSE, CAPILLARY
GLUCOSE-CAPILLARY: 102 mg/dL — AB (ref 65–99)
GLUCOSE-CAPILLARY: 95 mg/dL (ref 65–99)
GLUCOSE-CAPILLARY: 96 mg/dL (ref 65–99)
GLUCOSE-CAPILLARY: 99 mg/dL (ref 65–99)
Glucose-Capillary: 113 mg/dL — ABNORMAL HIGH (ref 65–99)
Glucose-Capillary: 96 mg/dL (ref 65–99)
Glucose-Capillary: 92 mg/dL (ref 65–99)
Glucose-Capillary: 91 mg/dL (ref 65–99)
Glucose-Capillary: 85 mg/dL (ref 65–99)

## 2015-07-09 LAB — RENAL FUNCTION PANEL
Albumin: 2.1 g/dL — ABNORMAL LOW (ref 3.5–5.0)
Anion gap: 8 (ref 5–15)
BUN: 7 mg/dL (ref 6–20)
CALCIUM: 8.4 mg/dL — AB (ref 8.9–10.3)
CO2: 20 mmol/L — ABNORMAL LOW (ref 22–32)
CREATININE: 0.88 mg/dL (ref 0.61–1.24)
Chloride: 114 mmol/L — ABNORMAL HIGH (ref 101–111)
Glucose, Bld: 93 mg/dL (ref 65–99)
PHOSPHORUS: 3.3 mg/dL (ref 2.5–4.6)
Potassium: 4 mmol/L (ref 3.5–5.1)
SODIUM: 142 mmol/L (ref 135–145)

## 2015-07-09 LAB — MAGNESIUM: Magnesium: 1.8 mg/dL (ref 1.7–2.4)

## 2015-07-09 MED ORDER — PIPERACILLIN-TAZOBACTAM 3.375 G IVPB
3.3750 g | Freq: Three times a day (TID) | INTRAVENOUS | Status: DC
  Administered 2015-07-09 – 2015-07-12 (×10): 3.375 g via INTRAVENOUS
  Filled 2015-07-09 (×11): qty 50

## 2015-07-09 NOTE — Progress Notes (Signed)
[facility] PROGRESS NOTE  Colin Bradley ZOX:[id number] DOB: 10-Apr-1951 DOA: 06/20/2015 PCP: Julian Hy, MD  Assessment/Plan: 1. Recurrent aspiration. -Patient is a pleasant CT 64-year-old gentleman with a history of intellectual disability, cerebral palsy, having evidence of recurrent aspirations that likely lead to cardiopulmonary arrest during this hospitalization. -Last chest x-ray performed on 07/08/2015 showed increasing density in the right lung base representing infiltrate. -This morning he spiked a temperature of 101.1 -He has been made nothing by mouth, speech pathology again consulted. -This is a difficult situation for Colin Bradley with his underlying neurologic condition. Artificial feeding unlikely to change and point, be conducive to confirm or prevent future aspirations from occurring.  -Palliative care has been involved in his care.  -Since he spiked a temperature 101.1 this morning. I feel compelled to make him nothing by mouth for now and start antimicrobial therapy. Will follow-up on repeat chest x-ray although in all likelihood this will show increasing right sided infiltrate. -We will need to address the overall goals of care as he will likely continue to aspirate.  2.  Upper GI bleed. -Colin Bradley initially presented with hematemesis undergoing upper endoscopy on 06/20/2015 that revealed mild gastritis. There was suspicion that Mallory-Weiss tear may have been the source of bleed. -He has not had further episodes of GI bleeding.  3.  Dysphasia. -He continues to have issues with recurrent aspirations, chest x-ray showing increasing right lower lobe infiltrate. -This morning he spiked a temperature of 101.2. -I don't think that a PEG tube would improve his quality of life or even prevent future aspirations from occurring. He seems to have difficulties managing his own secretions. -For now will make him nothing by mouth. Treat with empiric IV Zosyn  4.   Acute hypoxemic respiratory failure -Patient requiring rapid sequence intubation 2 during this hospitalization likely resulting from aspiration. -He is currently stable  5.  History of seizure disorder. -Continue Vimpat  6.  Goals of care -Colin Bradley having a history of intellectual disability, unable to participate in his own plan of care. He is aware the state with the chart mentioning Roxana Brand as state appointed guardian. He has had several episodes of acute hypoxemic respiratory failure requiring rapid sequence intubation during this hospitalization likely related to repeated aspirations. Prognosis unfortunately is poor as he will likely continue to aspirate. As I mentioned above I don't think that PEG tube placement was improve quality of life nor prevent future aspirations. This unlikely to change and point. He continues to have issues with management of this secretions. His CODE STATUS now is a DO NOT RESUSCITATE. Plan to continue providing supportive care. Spiked a temperature 101.2 this morning likely secondary to recurrent aspiration pneumonia starting empiric IV Zosyn. Will follow-up with state guardian to determine care plan for Colin Bradley  Code Status: DO NOT RESUSCITATE Family Communication:  Disposition Plan: Plan to transfer patient to MedSurg today   Consultants:  Pulmonary critical care medicine  GI  Palliative care  Procedures: 9/13 Intubated & admitted Upper GIB 9/13 EGD w/ hiatal hernia but no source of active bleeding 9/14 hyperchloremic acidosis  9/15 tachycardic, lopressor restarted. Continued on rx for pneumonia  9/16 WBC trending down, hgb stable, still w/ NAG metabolic acidosis.  9/18 hgb down w/o active bleeding. Patient more awake. 9/19 Extubated  9/20 To [facility] 9/27 Asystolic arrest, intubated on floor. Required ~1 minute of CPR with ROSC. Tx back to ICU 9/28 Pt more alert, not tolerating weaning due to low volumes 9/30:  RT pt  alert, watching TV. Weaning on 10/5. Attempted 0/0 with Vt of 500-1.2L without distress  Antibiotics:  Zosyn IV  HPI/Subjective: Colin Bradley is a 64 year old gentleman with a past medical history of cerebral palsy, intellectual disability, seizure disorder who is currently ward of the state, initially admitted to the pulmonary critical care service on 06/20/2015 when he presented as a transfer from his facility to the emergency department at [facility] for coffee-ground emesis. Due to inability to protect airway and maintain oxygen saturations he underwent rapid sequence intubation in the emergency department and admitted to the intensive care unit. He was started on IV Protonix. Acute respiratory failure is felt to be secondary to aspiration. On 06/20/2015 he underwent upper endoscopy which showed mild erosive changes without evidence of active bleed. It was felt that GI bleed could have been related to Mallory-Weiss tear in setting of multiple episodes of nausea and vomiting. He was extubated on 06/26/2015. On 07/04/2015 he went into cardiopulmonary arrest undergoing CPR that was delivered for approximately 1 minute with return of spontaneous circulation. He was transferred back to the intensive care unit. This is likely related to ongoing issues with aspiration and his inability to protect airway. Palliative care was consulted for facilitation of discussion of goals of care with guardian. He was extubated on 07/07/2015. His CODE STATUS was changed to a DO NOT RESUSCITATE. He continues to have difficulties with managing oral secretions and going into respiratory failure. On 07/09/2015 he spiked a temperature of 101.2. Pulmonary critical care medicine feeling that artificial feeding will greatly impact her quality of life without likely change of endpoint. He was transferred to floor on 07/09/2015.   Objective: Filed Vitals:   07/09/15 0600  BP: 129/48  Pulse: 65  Temp:   Resp: 20     Intake/Output Summary (Last 24 hours) at 07/09/15 0816 Last data filed at 07/09/15 0645  Gross per 24 hour  Intake   1165 ml  Output    450 ml  Net    715 ml   Filed Weights   07/07/15 0422 07/08/15 0400 07/09/15 0500  Weight: 61.4 kg (135 lb 5.8 oz) 60.2 kg (132 lb 11.5 oz) 59.1 kg (130 lb 4.7 oz)    Exam:   General:  Colin Bradley is nonverbal, chronically ill-appearing, no acute distress. He was able to follow a few simple commands for me.  Cardiovascular: Regular rate and rhythm normal S1-S2 no murmurs rubs or gallops  Respiratory: Coarse respiratory sounds, positive rhonchi at right lung. He currently does not appear to be in respiratory distress, without evidence of using accessory muscles  Abdomen: Soft nontender nondistended  Musculoskeletal: SCDs in place no edema to extremities  Data Reviewed: Basic Metabolic Panel:  Recent Labs Lab 07/05/15 0335 07/06/15 0345 07/07/15 0400 07/07/15 0410 07/08/15 0403 07/08/15 0404 07/09/15 0345  NA 136 136 139  --  140 139 142  K 4.0 4.1 4.3  --  4.1 4.1 4.0  CL 108 110 111  --  113* 115* 114*  CO2 21* 19* 22  --  19* 20* 20*  GLUCOSE 75 114* 108*  --  98 95 93  BUN --  CREATININE 0.98 1.04 1.01  --  0.77 0.76 0.88  CALCIUM 7.9* 7.8* 8.0*  --  8.1* 7.9* 8.4*  MG 1.8 1.7  --  1.7 1.6*  --  1.8  PHOS 3.9  3.9 3.8 3.4  --   --  3.7 3.3   Liver Function Tests:  Recent Labs Lab 07/04/15 1025 07/05/15 0335 07/06/15 0345 07/07/15 0400 07/08/15 0404 07/09/15 0345  AST 27  --   --   --   --   --   ALT 10*  --   --   --   --   --   ALKPHOS 89  --   --   --   --   --   BILITOT 0.4  --   --   --   --   --   PROT 6.8  --   --   --   --   --   ALBUMIN 2.7* 2.2* 2.1* 2.0* 2.1* 2.1*   No results for input(s): LIPASE, AMYLASE in the last 168 hours. No results for input(s): AMMONIA in the last 168 hours. CBC:  Recent Labs Lab 07/04/15 0508 07/04/15 1025 07/05/15 0335 07/06/15 0345  07/07/15 0410 07/08/15 0403  WBC 7.4 9.7 7.5 8.1 6.2 6.3  NEUTROABS 4.2 6.3  --   --   --   --   HGB 8.7* 10.3* 8.7* 8.2* 8.3* 8.0*  HCT 26.3* 31.3* 25.9* 24.7* 25.4* 24.7*  MCV 96.3 96.6 96.3 95.7 97.3 96.5  PLT 528* 698* 579* 557* 537* 500*   Cardiac Enzymes:  Recent Labs Lab 07/04/15 1025 07/04/15 1620 07/04/15 2220  TROPONINI <0.03 0.04* <0.03   BNP (last 3 results)  Recent Labs  12/03/14 1650  BNP 133.7*    ProBNP (last 3 results) No results for input(s): PROBNP in the last 8760 hours.  CBG:  Recent Labs Lab 07/08/15 0334 07/08/15 0810 07/08/15 1201 07/08/15 1606 07/08/15 2052  GLUCAP 95 95 102* 113* 92    No results found for this or any previous visit (from the past 240 hour(s)).   Studies: Dg Chest Port 1 View  07/08/2015   CLINICAL DATA:  Acute respiratory failure  EXAM: PORTABLE CHEST 1 VIEW  COMPARISON:  Radiograph 07/07/2015  FINDINGS: Stable cardiac silhouette. Interval removal of NG tube. The bilateral pleural effusions and basilar atelectasis. Increased density at the RIGHT lung base.  IMPRESSION: 1. Increasing density RIGHT lung base representing infiltrate or edema. 2. Stable bibasilar atelectasis and effusions.   Electronically Signed   By: Genevive Bi M.D.   On: 07/08/2015 07:25    Scheduled Meds: . antiseptic oral rinse  7 mL Mouth Rinse QID  . baclofen  5 mg Oral TID  . chlorhexidine gluconate  15 mL Mouth Rinse BID  . cloNIDine  0.2 mg Transdermal Weekly  . cycloSPORINE  1 drop Both Eyes BID  . escitalopram  20 mg Per Tube Daily  . guaiFENesin  5 mL Oral BID  . lacosamide (VIMPAT) IV  150 mg Intravenous Q12H  . metoprolol  7.5 mg Intravenous 4 times per day  . pantoprazole (PROTONIX) IV  40 mg Intravenous Q12H  . piperacillin-tazobactam (ZOSYN)  IV  3.375 g Intravenous Q8H  . QUEtiapine  25 mg Oral QHS  . tamsulosin  0.4 mg Oral Daily   Continuous Infusions: . dextrose 5 % and 0.9% NaCl 1,000 mL with potassium chloride 40 mEq  infusion 50 mL/hr at 07/09/15 0646  . fentaNYL infusion INTRAVENOUS Stopped (07/08/15 0700)    Principal Problem:   Cardiopulmonary arrest (HCC) Active Problems:   Depression   Mental retardation   Infantile cerebral palsy (HCC)   Anemia   Seizure disorder (HCC)   Esophagitis   HCAP (healthcare-associated pneumonia)   Acute upper GI  bleed   GI bleed   Ventilator dependent (HCC)   Acute renal failure (HCC)   Acute respiratory failure with hypoxia (HCC)   Nausea & vomiting   Respiratory failure (HCC)   Pneumonitis   Dysphagia, pharyngoesophageal phase   Acute blood loss anemia   Leukocytosis   Palliative care encounter   DNR (do not resuscitate) discussion   Dysphagia    Time spent: 35  min    Milton Streicher  [facility] Pager [phone]. If 7PM-7AM, please contact night-coverage at [email or web address], password [email or web address] 07/09/2015, 8:16 AM  LOS: 19 days

## 2015-07-09 NOTE — Progress Notes (Addendum)
SLP Cancellation Note  Patient Details Name: ABDULKADIR EMMANUEL MRN: [id number] DOB: [birth year]   Cancelled treatment:       Reason Eval/Treat Not Completed: Medical issues which prohibited therapy. Reviewed chart and spoke with Dr. Vanessa Barbara via phone. Patient now has spiked a fever and decision made to continue NPO and complete re-assessment of swallowing in am 10/3. This will likely be a difficult situation as patient has a known severe dysphagia with previous restrictive diet recommendations made with known risk of aspiration. Now with re-intubation, and likely a worsening in conditioning, doubtful that swallowing function has  Improved. In addition, question of GI/esophageal component further complicates swallowing function and ability to protect airway.  If patient aspirating secretions, non-oral means of nutrition will not prevent aspiration or an aspiration related infection. Will plan to re-evaluate at bedside 10/3 and discuss further with MD and palliative medicine pending results as previous notes indicate that patient very much wants to continue eating by mouth and quality of life should be considered.   Ferdinand Lango MA, CCC-SLP [phone]    Ferdinand Lango Meryl 07/09/2015, 3:53 PM

## 2015-07-10 LAB — GLUCOSE, CAPILLARY
GLUCOSE-CAPILLARY: 92 mg/dL (ref 65–99)
GLUCOSE-CAPILLARY: 108 mg/dL — AB (ref 65–99)
GLUCOSE-CAPILLARY: 115 mg/dL — AB (ref 65–99)
Glucose-Capillary: 111 mg/dL — ABNORMAL HIGH (ref 65–99)
Glucose-Capillary: 86 mg/dL (ref 65–99)
Glucose-Capillary: 89 mg/dL (ref 65–99)

## 2015-07-10 LAB — RENAL FUNCTION PANEL
Albumin: 2.3 g/dL — ABNORMAL LOW (ref 3.5–5.0)
Anion gap: 8 (ref 5–15)
BUN: 7 mg/dL (ref 6–20)
CHLORIDE: 110 mmol/L (ref 101–111)
CO2: 20 mmol/L — AB (ref 22–32)
Calcium: 8.4 mg/dL — ABNORMAL LOW (ref 8.9–10.3)
Creatinine, Ser: 0.93 mg/dL (ref 0.61–1.24)
GFR calc Af Amer: >60 mL/min (ref >60)
GLUCOSE: 99 mg/dL (ref 65–99)
POTASSIUM: 4.1 mmol/L (ref 3.5–5.1)
Phosphorus: 4 mg/dL (ref 2.5–4.6)
Sodium: 138 mmol/L (ref 135–145)

## 2015-07-10 LAB — CBC
HEMATOCRIT: 26.7 % — AB (ref 39.0–52.0)
Hemoglobin: 8.5 g/dL — ABNORMAL LOW (ref 13.0–17.0)
MCH: 30.4 pg (ref 26.0–34.0)
MCHC: 31.8 g/dL (ref 30.0–36.0)
MCV: 95.4 fL (ref 78.0–100.0)
Platelets: 581 10*3/uL — ABNORMAL HIGH (ref 150–400)
RBC: 2.8 MIL/uL — ABNORMAL LOW (ref 4.22–5.81)
RDW: 13.2 % (ref 11.5–15.5)
WBC: 7.3 10*3/uL (ref 4.0–10.5)

## 2015-07-10 LAB — MAGNESIUM: MAGNESIUM: 1.7 mg/dL (ref 1.7–2.4)

## 2015-07-10 MED ORDER — RESOURCE THICKENUP CLEAR PO POWD: ORAL | Status: DC | PRN

## 2015-07-10 NOTE — Progress Notes (Signed)
   07/10/15 1300  Clinical Encounter Type  Visited With Patient  Visit Type Initial  Referral From Chaplain  Consult/Referral To None  Spiritual Encounters  Spiritual Needs Emotional  Stress Factors  Patient Stress Factors Lack of caregivers  Family Stress Factors Not reviewed    Counseling intern followed up with pt for emotional needs. Pt indicated that he was worried about "doing the right thing" and reported that he missed his father and wished his father would come visit him, although he reported that his father was likely not coming. Pt repeatedly asked if he was "doing ok" and said he "always tries to stay calm" and "always tries to be kind." He also mentioned feeling that his father, a retired Emergency planning/management officer, might be mad if he "gets too fresh" or isn't calm. Pt reported that he was feeling "happy and not sad" today. Counselor provided brief emotional support for pt and discussed his feelings surrounding missing his father. Counseling intern will follow up on Wednesday 10/5.  Graciela Husbands Counseling Intern

## 2015-07-10 NOTE — Care Management Note (Signed)
Case Management Note  Patient Details  Name: GORDY GOAR MRN: [id number] Date of Birth: September 18, 1951  Subjective/Objective:   Recurrent aspirations.ST-dysphagia 1 diet,temp-101.2-iv abx,ivf,iv protonix,fentanyl infusion.Palliative-DNR.Ward of the State-CSW following.                 Action/Plan:d/c plan return ward of the state.   Expected Discharge Date:   (UNKNOWN)               Expected Discharge Plan:  Skilled Nursing Facility  In-House Referral:  Clinical Social Work  Discharge planning Services  CM Consult  Post Acute Care Choice:  NA Choice offered to:  NA  DME Arranged:    DME Agency:     HH Arranged:    HH Agency:     Status of Service:  In process, will continue to follow  Medicare Important Message Given:  Yes-fourth notification given Date Medicare IM Given:    Medicare IM give by:    Date Additional Medicare IM Given:    Additional Medicare Important Message give by:     If discussed at Long Length of Stay Meetings, dates discussed:    Additional Comments:  Lanier Clam, RN 07/10/2015, 9:02 PM

## 2015-07-10 NOTE — Progress Notes (Signed)
[facility] PROGRESS NOTE  Colin Bradley:[id number] DOB: 08/10/1951 DOA: 06/20/2015 PCP: Julian Hy, MD  Assessment/Plan: 1. Recurrent aspiration. -Patient is a pleasant CT 64-year-old gentleman with a history of intellectual disability, cerebral palsy, having evidence of recurrent aspirations that likely lead to cardiopulmonary arrest during this hospitalization. -Last chest x-ray performed on 07/08/2015 showed increasing density in the right lung base representing infiltrate. -He spiked a temperature of 101.1 on 07/09/2015 -This is a difficult situation for Colin Bradley with his underlying neurologic condition. Artificial feeding unlikely to change and point, not conductive to comfort nor prevent future aspirations from occurring.  -Palliative care has been involved in his care.  -Since he spiked a temperature 101.1 this morning. I felt compelled to make him nothing by mouth and start antimicrobial therapy with IV Zosyn. Has not spiked further temps.   -On 07/10/2015 case was discussed with SLP who reported that swallow function appeared similar and consistent with cerebral palsy. His diet has been advanced to Dysphagia 1. Will see how he does on this diet.   2.  Upper GI bleed. -Colin Bradley initially presented with hematemesis undergoing upper endoscopy on 06/20/2015 that revealed mild gastritis. There was suspicion that Mallory-Weiss tear may have been the source of bleed. -He has not had further episodes of GI bleeding.  3.  Dysphasia. -He continues to have issues with recurrent aspirations, chest x-ray showing increasing right lower lobe infiltrate. -This morning he spiked a temperature of 101.2. -I don't think that a PEG tube would improve his quality of life or even prevent future aspirations from occurring. He seems to have difficulties managing his own secretions. -As mentioned above his diet was advanced to Dysphagia 1 per SLP's recommendations on 07/10/2015.   4.   Acute hypoxemic respiratory failure -Patient requiring rapid sequence intubation 2 during this hospitalization likely resulting from aspiration. -He is currently stable  5.  History of seizure disorder. -Continue Vimpat  6.  Goals of care -[name] having a history of intellectual disability, unable to participate in his own plan of care. He is aware the state with the chart mentioning Roxana Brand as state appointed guardian. He has had several episodes of acute hypoxemic respiratory failure requiring rapid sequence intubation during this hospitalization likely related to repeated aspirations. Prognosis unfortunately is poor as he will likely continue to aspirate. As I mentioned above I don't think that PEG tube placement was improve quality of life nor prevent future aspirations. This unlikely to change and point. He continues to have issues with management of this secretions. His CODE STATUS now is a DO NOT RESUSCITATE. Plan to continue providing supportive care. Spiked a temperature 101.2 this morning likely secondary to recurrent aspiration pneumonia starting empiric IV Zosyn. Will follow-up with state guardian to determine care plan for [name]  Code Status: DO NOT RESUSCITATE Family Communication:  Disposition Plan: Advancing diet today.    Consultants:  Pulmonary critical care medicine  GI  Palliative care  Procedures: 9/13 Intubated & admitted Upper GIB 9/13 EGD w/ hiatal hernia but no source of active bleeding 9/14 hyperchloremic acidosis  9/15 tachycardic, lopressor restarted. Continued on rx for pneumonia  9/16 WBC trending down, hgb stable, still w/ NAG metabolic acidosis.  9/18 hgb down w/o active bleeding. Patient more awake. 9/19 Extubated  9/20 To TRH 9/27 Asystolic arrest, intubated on floor. Required ~1 minute of CPR with ROSC. Tx back to ICU 9/28 Pt more alert, not tolerating weaning due to low volumes 9/30: RT  pt alert, watching TV.  Weaning on 10/5. Attempted 0/0 with Vt of 500-1.2L without distress  Antibiotics:  Zosyn IV  HPI/Subjective: Colin Bradley is a 64 year old gentleman with a past medical history of cerebral palsy, intellectual disability, seizure disorder who is currently ward of the state, initially admitted to the pulmonary critical care service on 06/20/2015 when he presented as a transfer from his facility to the emergency department at [facility] for coffee-ground emesis. Due to inability to protect airway and maintain oxygen saturations he underwent rapid sequence intubation in the emergency department and admitted to the intensive care unit. He was started on IV Protonix. Acute respiratory failure is felt to be secondary to aspiration. On 06/20/2015 he underwent upper endoscopy which showed mild erosive changes without evidence of active bleed. It was felt that GI bleed could have been related to Mallory-Weiss tear in setting of multiple episodes of nausea and vomiting. He was extubated on 06/26/2015. On 07/04/2015 he went into cardiopulmonary arrest undergoing CPR that was delivered for approximately 1 minute with return of spontaneous circulation. He was transferred back to the intensive care unit. This is likely related to ongoing issues with aspiration and his inability to protect airway. Palliative care was consulted for facilitation of discussion of goals of care with guardian. He was extubated on 07/07/2015. His CODE STATUS was changed to a DO NOT RESUSCITATE. He continues to have difficulties with managing oral secretions and going into respiratory failure. On 07/09/2015 he spiked a temperature of 101.2. Pulmonary critical care medicine feeling that artificial feeding will greatly impact her quality of life without likely change of endpoint. He was transferred to floor on 07/09/2015.   Objective: Filed Vitals:   07/10/15 1405  BP: 157/82  Pulse: 58  Temp: 98 F (36.7 C)  Resp: 20     Intake/Output Summary (Last 24 hours) at 07/10/15 1744 Last data filed at 07/10/15 1150  Gross per 24 hour  Intake      0 ml  Output    800 ml  Net   -800 ml   Filed Weights   07/07/15 0422 07/08/15 0400 07/09/15 0500  Weight: 61.4 kg (135 lb 5.8 oz) 60.2 kg (132 lb 11.5 oz) 59.1 kg (130 lb 4.7 oz)    Exam:   General:  Mr. Opdahl seems better today, he is interactive, pleasant, talking more.   Cardiovascular: Regular rate and rhythm normal S1-S2 no murmurs rubs or gallops  Respiratory: Coarse respiratory sounds, positive rhonchi at right lung. He currently does not appear to be in respiratory distress, without evidence of using accessory muscles  Abdomen: Soft nontender nondistended  Musculoskeletal: SCDs in place no edema to extremities  Data Reviewed: Basic Metabolic Panel:  Recent Labs Lab 07/06/15 0345 07/07/15 0400 07/07/15 0410 07/08/15 0403 07/08/15 0404 07/09/15 0345 07/10/15 0547  NA 136 139  --  140 139 142 138  K 4.1 4.3  --  4.1 4.1 4.0 4.1  CL 110 111  --  113* 115* 114* 110  CO2 19* 22  --  19* 20* 20* 20*  GLUCOSE 114* 108*  --  98 95 93 99  BUN 11 13  --  CREATININE 1.04 1.01  --  0.77 0.76 0.88 0.93  CALCIUM 7.8* 8.0*  --  8.1* 7.9* 8.4* 8.4*  MG 1.7  --  1.7 1.6*  --  1.8 1.7  PHOS 3.8 3.4  --   --  3.7 3.3 4.0  Liver Function Tests:  Recent Labs Lab 07/04/15 1025  07/06/15 0345 07/07/15 0400 07/08/15 0404 07/09/15 0345 07/10/15 0547  AST 27  --   --   --   --   --   --   ALT 10*  --   --   --   --   --   --   ALKPHOS 89  --   --   --   --   --   --   BILITOT 0.4  --   --   --   --   --   --   PROT 6.8  --   --   --   --   --   --   ALBUMIN 2.7*  < > 2.1* 2.0* 2.1* 2.1* 2.3*  < > = values in this interval not displayed. No results for input(s): LIPASE, AMYLASE in the last 168 hours. No results for input(s): AMMONIA in the last 168 hours. CBC:  Recent Labs Lab 07/04/15 0508 07/04/15 1025 07/05/15 0335  07/06/15 0345 07/07/15 0410 07/08/15 0403 07/10/15 0547  WBC 7.4 9.7 7.5 8.1 6.2 6.3 7.3  NEUTROABS 4.2 6.3  --   --   --   --   --   HGB 8.7* 10.3* 8.7* 8.2* 8.3* 8.0* 8.5*  HCT 26.3* 31.3* 25.9* 24.7* 25.4* 24.7* 26.7*  MCV 96.3 96.6 96.3 95.7 97.3 96.5 95.4  PLT 528* 698* 579* 557* 537* 500* 581*   Cardiac Enzymes:  Recent Labs Lab 07/04/15 1025 07/04/15 1620 07/04/15 2220  TROPONINI <0.03 0.04* <0.03   BNP (last 3 results)  Recent Labs  12/03/14 1650  BNP 133.7*    ProBNP (last 3 results) No results for input(s): PROBNP in the last 8760 hours.  CBG:  Recent Labs Lab 07/10/15 0021 07/10/15 0402 07/10/15 0829 07/10/15 1221 07/10/15 1702  GLUCAP 86 89 92 108* 111*    No results found for this or any previous visit (from the past 240 hour(s)).   Studies: Dg Chest 1 View  07/09/2015   CLINICAL DATA:  Aspiration.  Patient with cerebral palsy.  EXAM: CHEST 1 VIEW  COMPARISON:  07/08/2015 and prior studies  FINDINGS: Cardiomediastinal silhouette is unchanged.  Bilateral airspace opacities have slightly decreased with improved aeration in the lung bases.  There is no evidence of pneumothorax.  No other interval change identified.  IMPRESSION: Slightly decreased bilateral airspace opacities and improved bibasilar aeration.   Electronically Signed   By: Harmon Pier M.D.   On: 07/09/2015 10:01    Scheduled Meds: . antiseptic oral rinse  7 mL Mouth Rinse QID  . baclofen  5 mg Oral TID  . chlorhexidine gluconate  15 mL Mouth Rinse BID  . cloNIDine  0.2 mg Transdermal Weekly  . cycloSPORINE  1 drop Both Eyes BID  . escitalopram  20 mg Per Tube Daily  . guaiFENesin  5 mL Oral BID  . lacosamide (VIMPAT) IV  150 mg Intravenous Q12H  . metoprolol  7.5 mg Intravenous 4 times per day  . pantoprazole (PROTONIX) IV  40 mg Intravenous Q12H  . piperacillin-tazobactam (ZOSYN)  IV  3.375 g Intravenous Q8H  . QUEtiapine  25 mg Oral QHS  . tamsulosin  0.4 mg Oral Daily    Continuous Infusions: . dextrose 5 % and 0.9% NaCl 1,000 mL with potassium chloride 40 mEq infusion 50 mL/hr at 07/10/15 0129  . fentaNYL infusion INTRAVENOUS Stopped (07/08/15 0700)    Principal Problem:   Cardiopulmonary  arrest [facility]) Active Problems:   Depression   Mental retardation   Infantile cerebral palsy (HCC)   Anemia   Seizure disorder (HCC)   Esophagitis   HCAP (healthcare-associated pneumonia)   Acute upper GI bleed   GI bleed   Ventilator dependent (HCC)   Acute renal failure (HCC)   Acute respiratory failure with hypoxia (HCC)   Nausea & vomiting   Respiratory failure (HCC)   Pneumonitis   Dysphagia, pharyngoesophageal phase   Acute blood loss anemia   Leukocytosis   Palliative care encounter   DNR (do not resuscitate) discussion   Dysphagia    Time spent: 25  min    Colin Bradley  [facility] Pager [phone]. If 7PM-7AM, please contact night-coverage at [email or web address], password [facility] 07/10/2015, 5:44 PM  LOS: 20 days

## 2015-07-10 NOTE — Evaluation (Signed)
Clinical/Bedside Swallow Evaluation Patient Details  Name: Colin Bradley MRN: [id number] Date of Birth: 1951-01-30  Today's Date: 07/10/2015 Time: SLP Start Time (ACUTE ONLY): 1232 SLP Stop Time (ACUTE ONLY): 1249 SLP Time Calculation (min) (ACUTE ONLY): 17 min  Past Medical History:  Past Medical History  Diagnosis Date  . Seizures   . Cerebral palsy   . Hypertension   . Mental retardation   . Depression   . Nephrolithiasis   . BPH (benign prostatic hyperplasia)   . GI bleed 2009    coffee ground emesis, erosive esophagitis on EGD by Dr Juanda Chance  . Esophageal stricture 2009    not dilated during the EGD, biopsy benign:  no Barretts  . Depression with anxiety    Past Surgical History:  Past Surgical History  Procedure Laterality Date  . Basal cell carcinoma excision  2002    forehead  . Squamous cell carcinoma excision  2004    lip  . Bowen dz  2002    right cheek   . Esophagogastroduodenoscopy  06/14/2012    Procedure: ESOPHAGOGASTRODUODENOSCOPY (EGD);  Surgeon: Rachael Fee, MD;  Location: [facility] ENDOSCOPY;  Service: Endoscopy;  Laterality: N/A;  . Multiple extractions with alveoloplasty N/A 06/20/2014    Procedure: MULTIPLE EXTRACTIONS TEETH NUMBER TWO, TEN, TWELVE, EIGHTEEN, TWENTY-ONE, TWENTY-TWO, TWENTY-NINE, THIRTY-TWO;  Surgeon: Georgia Lopes, DDS;  Location: [facility] OR;  Service: Oral Surgery;  Laterality: N/A;  . Esophagogastroduodenoscopy N/A 06/20/2015    Procedure: ESOPHAGOGASTRODUODENOSCOPY (EGD);  Surgeon: Ruffin Frederick, MD;  Location: [facility] ENDOSCOPY;  Service: Gastroenterology;  Laterality: N/A;   HPI:  64 yo male with h/o CP, intellectual disability, coffee ground emesis 9/13 - required intubation.  Pt extubated 06/26/15, swallow evaluation ordered.  Pt found to have right mid and lower lobe infiltrate.  EGD showing HH and no active bleed 9/13.    Pt resides at group home and is on a regular/chopped diet prior to admission.  He is a ward of the state. MBS 9/24  recommended Dys 1, honey thick liquids (high aspiration risk). Pt coded 2023/07/16 (possible aspiration?), intubated 9/Jul 15, 2028. Swallow assessment reordered; conversation with Dr. Vanessa Barbara to reassess at bedside for ability to initiate recommeded po's following MBS 9/24 with known risks.    Assessment / Plan / Recommendation Clinical Impression  [name]'s swallow function appeared similar to results of prior assessments. Decreased oral control and coordination characteristics with pt's having cerebral palsy. Decreased laryngeal elevation on palpation. No cough, throat clear or wet vocal quality. No reflexvie multiple swallows present and required max verbal/visual cues to perform additional swallows (max residue during [facility] 9/24). Aspiration risk continues to be high. Strict adherence of precautions (sitting upright, small bite/sip sizes, stay upright minimum 30 minutes after meals, ask pt to swallow twice). Palliative care involved in pt. care. ST will follow up minimum of once more for education/answering questions.      Aspiration Risk  Severe    Diet Recommendation Dysphagia 1 (Puree) (honey thick)   Medication Administration: Crushed with puree Compensations: Slow rate;Small sips/bites;Check for pocketing;Multiple dry swallows after each bite/sip    Other  Recommendations Oral Care Recommendations: Oral care BID Other Recommendations: Order thickener from pharmacy   Follow Up Recommendations       Frequency and Duration min 1 x/week  1 week   Pertinent Vitals/Pain none         Swallow Study          Oral/Motor/Sensory Function Overall Oral Motor/Sensory Function:  (decr  control and coordination c/w pt's CP)   Ice Chips Ice chips: Not tested   Thin Liquid Thin Liquid: Not tested    Nectar Thick Nectar Thick Liquid: Not tested   Honey Thick Honey Thick Liquid: Impaired Presentation: Cup;Spoon Oral Phase Impairments: Reduced lingual movement/coordination;Reduced labial  seal Oral Phase Functional Implications: Prolonged oral transit Pharyngeal Phase Impairments: Decreased hyoid-laryngeal movement;Suspected delayed Swallow   Puree Puree: Impaired Presentation: Spoon Oral Phase Impairments: Reduced lingual movement/coordination;Impaired anterior to posterior transit Pharyngeal Phase Impairments: Suspected delayed Swallow;Decreased hyoid-laryngeal movement   Solid   GO    Solid: Not tested       Royce Macadamia 07/10/2015,1:09 PM  Breck Coons Lonell Face.Ed [employer] [phone]

## 2015-07-11 LAB — MAGNESIUM: Magnesium: 1.7 mg/dL (ref 1.7–2.4)

## 2015-07-11 LAB — GLUCOSE, CAPILLARY
GLUCOSE-CAPILLARY: 114 mg/dL — AB (ref 65–99)
GLUCOSE-CAPILLARY: 92 mg/dL (ref 65–99)
GLUCOSE-CAPILLARY: 117 mg/dL — AB (ref 65–99)
Glucose-Capillary: 116 mg/dL — ABNORMAL HIGH (ref 65–99)
Glucose-Capillary: 142 mg/dL — ABNORMAL HIGH (ref 65–99)
Glucose-Capillary: 163 mg/dL — ABNORMAL HIGH (ref 65–99)

## 2015-07-11 LAB — RENAL FUNCTION PANEL
ANION GAP: 9 (ref 5–15)
Albumin: 2.2 g/dL — ABNORMAL LOW (ref 3.5–5.0)
BUN: 6 mg/dL (ref 6–20)
CO2: 19 mmol/L — AB (ref 22–32)
Calcium: 8.4 mg/dL — ABNORMAL LOW (ref 8.9–10.3)
Chloride: 113 mmol/L — ABNORMAL HIGH (ref 101–111)
Creatinine, Ser: 1.08 mg/dL (ref 0.61–1.24)
GFR calc Af Amer: >60 mL/min (ref >60)
GFR calc non Af Amer: >60 mL/min (ref >60)
GLUCOSE: 92 mg/dL (ref 65–99)
POTASSIUM: 4.7 mmol/L (ref 3.5–5.1)
Phosphorus: 3.7 mg/dL (ref 2.5–4.6)
Sodium: 141 mmol/L (ref 135–145)

## 2015-07-11 NOTE — Care Management Important Message (Signed)
Important Message  Patient Details IM Letter given to Suzanne/Case Manager to present to Patient.Important Message  Patient Details  Name: Colin Bradley MRN: [id number] Date of Birth: 05/29/1951   Medicare Important Message Given:  Yes-second notification given    Haskell Flirt 07/11/2015, 12:53 PM Name: Colin Bradley MRN: [id number] Date of Birth: 11/24/1950   Medicare Important Message Given:  Yes-second notification given    Haskell Flirt 07/11/2015, 12:52 PM

## 2015-07-11 NOTE — Care Management Note (Signed)
Case Management Note  Patient Details  Name: Colin Bradley MRN: [id number] Date of Birth: 1951-06-22  Subjective/Objective:  Please see attending's note: Palliative care has been involved in his care.  -need to address the overall goals of care as he will likely continue to aspirate- would recommend that patient be allow to return to [facility] as DNR with hospice and no further hospitalizations. From ALF-CSW following for d/c plans.                  Action/Plan:d/c ALF   Expected Discharge Date:   (UNKNOWN)               Expected Discharge Plan:  Assisted Living / Rest Home  In-House Referral:  Clinical Social Work  Discharge planning Services  CM Consult  Post Acute Care Choice:  NA Choice offered to:  NA  DME Arranged:    DME Agency:     HH Arranged:    HH Agency:     Status of Service:  In process, will continue to follow  Medicare Important Message Given:  Yes-fourth notification given Date Medicare IM Given:    Medicare IM give by:    Date Additional Medicare IM Given:    Additional Medicare Important Message give by:     If discussed at Long Length of Stay Meetings, dates discussed:    Additional Comments:  Lanier Clam, RN 07/11/2015, 10:58 AM

## 2015-07-11 NOTE — Progress Notes (Signed)
Nutrition Follow-up  DOCUMENTATION CODES:   Not applicable  INTERVENTION:  - Will order Magic Cup BID with meals, each supplement provides 290 kcal and 9 grams of protein - RD will continue to monitor for needs  NUTRITION DIAGNOSIS:   Inadequate oral intake related to inability to eat as evidenced by NPO status. -improving with diet advancement  GOAL:   Patient will meet greater than or equal to 90% of their needs -likely not met at this time  MONITOR:   PO intake, Supplement acceptance, Weight trends, Labs, I & O's  ASSESSMENT:   64 yo WM with existing CP/MR with a history of HTN, erosive esophagitis, previous GI bleed who presented to [facility] from SNF coffee ground emesis and inability to protect airway and maintain adequate O2 saturations. He was intubated per EDP, started on PPI drip and GI was consulted. He is a ward of state and is a full code. State appt guardian is Hadley Pen DDS [phone]. He will be admitted to ICU under PCCM service.  10/4 Pt ate 50% of dinner yesterday following diet advancement. Pt unable to provide information during visit stating he is unsure if he ate breakfast and if he did he does not know what he had to eat.   Per MD note yesterday (10/3) and this AM: I don't think that PEG tube placement was improve quality of life nor prevent future aspirations. This unlikely to change and point. He continues to have issues with management of this secretions. His CODE STATUS now is a DO NOT RESUSCITATE. Plan to continue providing supportive care.   Pt likely not meeting needs at this time. Medications reviewed. Labs reviewed; CBGs: 85-142 mg/dL, Cl: 113 mmol/L, Ca: 8.4 mg/dL.   9/30 - Patient is currently intubated on ventilator support - MV: 9.9 L/min; Propofol: none - Per RT and NP, begin weaning for vent and attempt extubation later today.  - Pt is currently receiving Vital High Protein @ 40 mL/hr with 100 mL free water TID which is providing 960 kcal  (59% intubated kcal needs), 84 grams protein (100% needs), and 1102 mL free water. - TF recommendations outlined should pt remain on vent versus s/p extubation.  - NP note from yesterday indicates that pt may need PEG or J-tube for long-term TF; RD in agreement with this plan based on medical course, PMH.   9/27 - Panda was removed yesterday and pt advanced to CLD, nectar-thick.  - Pt coded this AM and now intubated with OGT in place. Needs re-estimated based on this. - Patient is currently intubated on ventilator support - MV: 9.6 L/min; Propofol: none  9/22 - Pt seen for new consult for RD to initiate and manage TF. - Pt is very high refeeding risk due to no nutrition for at least 9 days.  - Monitor magnesium, potassium, and phosphorus daily for at least 3 days, MD to replete as needed, as pt is at risk for refeeding syndrome given prolonged period of no nutrition. - Will order Jevity 1.2 @ 15 mL/hr and advance by 10 mL Q12h to goal rate of 55 mL/hr which will provide 1584 kcal, 73 grams protein, and 1065 mL free water. Will also order 100 mL free water Q8h to add additional 300 mL water/day.  9/16 - Patient is currently intubated on ventilator support - MV: 11.5 L/min - Propofol: 7.6 ml/hr (201 kcal) - Needs have been re-estimated based on current MV and medical course.  - Pt without nutrition support but NGT  in place.  - Spoke with NP this AM who states weaning of vent in progress with plan to turn off Propofol to trial extubation; he is hopeful if this occurs diet advancement to occur later today versus tomorrow but requests TF recommendations in case needed. Unsure of pt's baseline diet texture tolerance.  9/14 - Patient is currently intubated on ventilator support with OGT in place - MV: 13.9 L/min; Propofol: 13.4 ml/hr (354 kcal) -RN states pt does not have PEG or G-J tube from PTA.  - Per weight hx review, pt has gained 25 lbs since 10/2014. Mild muscle wasting noted to  clavicle area and lower legs; no fat wasting. - GI note from this AM indicates no active GIB overnight; will monitor for ability to initiate TF if pt to remain intubated >24 hours. - Recommended Vital AF 1.2 @ 55 mL/hr which will provide, in combination with current Propofol rate, 1938 kcal, 99 grams protein, and 1070 mL free water.   Diet Order:  DIET - DYS 1 Room service appropriate?: Yes; Fluid consistency:: Honey Thick  Skin:  Reviewed, no issues  Last BM:  9/29  Height:   Ht Readings from Last 1 Encounters:  06/20/15 _0  (1.702 m)    Weight:   Wt Readings from Last 1 Encounters:  07/09/15 130 lb 4.7 oz (59.1 kg)    Ideal Body Weight:  67.27 kg (kg)  BMI:  Body mass index is 20.4 kg/(m^2).  Estimated Nutritional Needs:   Kcal:  3276-1470  Protein:  60-70 grams  Fluid:  1.5L/day  EDUCATION NEEDS:   No education needs identified at this time     Jarome Matin, RD, LDN Inpatient Clinical Dietitian Pager # [phone] After hours/weekend pager # [phone]

## 2015-07-11 NOTE — Progress Notes (Signed)
[facility] PROGRESS NOTE  ALPHUS Bradley OVF:[id number] DOB: 17-Jan-1951 DOA: 06/20/2015 PCP: Colin Hy, MD  Colin Bradley is a 64 year old gentleman with a past medical history of cerebral palsy, intellectual disability, seizure disorder who is currently ward of the state, initially admitted to the pulmonary critical care service on 06/20/2015 when he presented as a transfer from his facility to the emergency department at [facility] for coffee-ground emesis. Due to inability to protect airway and maintain oxygen saturations he underwent rapid sequence intubation in the emergency department and admitted to the intensive care unit. He was started on IV Protonix. Acute respiratory failure is felt to be secondary to aspiration. On 06/20/2015 he underwent upper endoscopy which showed mild erosive changes without evidence of active bleed. It was felt that GI bleed could have been related to Mallory-Weiss tear in setting of multiple episodes of nausea and vomiting. He was extubated on 06/26/2015. On 07/04/2015 he went into cardiopulmonary arrest undergoing CPR that was delivered for approximately 1 minute with return of spontaneous circulation. He was transferred back to the intensive care unit. This is likely related to ongoing issues with aspiration and his inability to protect airway. Palliative care was consulted for facilitation of discussion of goals of care with guardian. He was extubated on 07/07/2015. His CODE STATUS was changed to a DO NOT RESUSCITATE. He continues to have difficulties with managing oral secretions and going into respiratory failure. On 07/09/2015 he spiked a temperature of 101.2. Pulmonary critical care medicine feeling that artificial feeding will NOT greatly impact quality of life without likely change of endpoint. He was transferred to floor on 07/09/2015.   Assessment/Plan: Recurrent aspiration. -Patient is a pleasant gentleman with a history of  intellectual disability, cerebral palsy, having evidence of recurrent aspirations that likely lead to cardiopulmonary arrest during this hospitalization. -Last chest x-ray performed on 07/08/2015 showed increasing density in the right lung base representing infiltrate. Still spiking temperatures -DYS diet with known aspiration risks -This is a difficult situation for Colin Bradley with his underlying neurologic condition. Artificial feeding unlikely to change and point, be conducive to confirm or prevent future aspirations from occurring.  -Palliative care has been involved in his care.  -need to address the overall goals of care as he will likely continue to aspirate- would recommend that patient be allow to return to [facility] as DNR with hospice and no further hospitalizations  Upper GI bleed. -Colin Bradley initially presented with hematemesis undergoing upper endoscopy on 06/20/2015 that revealed mild gastritis. There was suspicion that Mallory-Weiss tear may have been the source of bleed. -He has not had further episodes of GI bleeding.  Dysphasia. -He continues to have issues with recurrent aspirations, chest x-ray showing increasing right lower lobe infiltrate. -I don't think that a PEG tube would improve his quality of life or even prevent future aspirations from occurring. He seems to have difficulties managing his own secretions. -empiric IV Zosyn   Acute hypoxemic respiratory failure -Patient requiring rapid sequence intubation 2 during this hospitalization likely resulting from aspiration. -He is currently stable   History of seizure disorder. -Continue Vimpat  Goals of care -Colin Bradley having a history of intellectual disability, unable to participate in his own plan of care. He is aware the state with the chart mentioning Colin Bradley as state appointed guardian. He has had several episodes of acute hypoxemic respiratory failure requiring rapid sequence intubation during this  hospitalization likely related to repeated aspirations. Prognosis unfortunately is poor as he will  likely continue to aspirate. As I mentioned above I don't think that PEG tube placement was improve quality of life nor prevent future aspirations. This unlikely to change and point. He continues to have issues with management of this secretions. His CODE STATUS now is a DO NOT RESUSCITATE. Plan to continue providing supportive care.  Code Status: DO NOT RESUSCITATE Family Communication:  Disposition Plan: per DSS and palliative care   Consultants:  Pulmonary critical care medicine  GI  Palliative care  Procedures: 9/13 Intubated & admitted Upper GIB 9/13 EGD w/ hiatal hernia but no source of active bleeding 9/14 hyperchloremic acidosis  9/15 tachycardic, lopressor restarted. Continued on rx for pneumonia  9/16 WBC trending down, hgb stable, still w/ NAG metabolic acidosis.  9/18 hgb down w/o active bleeding. Patient more awake. 9/19 Extubated  9/20 To [facility] 9/27 Asystolic arrest, intubated on floor. Required ~1 minute of CPR with ROSC. Tx back to ICU 9/28 Pt more alert, not tolerating weaning due to low volumes 9/30: RT pt alert, watching TV. Weaning on 10/5. Attempted 0/0 with Vt of 500-1.2L without distress  Antibiotics:  Zosyn IV  HPI/Subjective: Not happy with choices on TV Patient eating, no overt coughing but food around mouth  Objective: Filed Vitals:   07/11/15 0519  BP: 144/76  Pulse: 60  Temp: 98.3 F (36.8 C)  Resp: 20    Intake/Output Summary (Last 24 hours) at 07/11/15 0913 Last data filed at 07/11/15 0600  Gross per 24 hour  Intake   2410 ml  Output   1001 ml  Net   1409 ml   Filed Weights   07/07/15 0422 07/08/15 0400 07/09/15 0500  Weight: 61.4 kg (135 lb 5.8 oz) 60.2 kg (132 lb 11.5 oz) 59.1 kg (130 lb 4.7 oz)    Exam:   General: nonverbal, chronically ill-appearing, no acute distress. He was able to follow a few simple  commands for me.  Cardiovascular: Regular rate and rhythm normal S1-S2 no murmurs rubs or gallops  Respiratory: Coarse respiratory sounds, positive rhonchi at right lung. He currently does not appear to be in respiratory distress, no accessory muscles use  Abdomen: Soft nontender nondistended   Data Reviewed: Basic Metabolic Panel:  Recent Labs Lab 07/07/15 0400 07/07/15 0410 07/08/15 0403 07/08/15 0404 07/09/15 0345 07/10/15 0547 07/11/15 0530  NA 139  --  140 139 142 138 141  K 4.3  --  4.1 4.1 4.0 4.1 4.7  CL 111  --  113* 115* 114* 110 113*  CO2 22  --  19* 20* 20* 20* 19*  GLUCOSE 108*  --  98 95 93 99 92  BUN 13  --  CREATININE 1.01  --  0.77 0.76 0.88 0.93 1.08  CALCIUM 8.0*  --  8.1* 7.9* 8.4* 8.4* 8.4*  MG  --  1.7 1.6*  --  1.8 1.7 1.7  PHOS 3.4  --   --  3.7 3.3 4.0 3.7   Liver Function Tests:  Recent Labs Lab 07/04/15 1025  07/07/15 0400 07/08/15 0404 07/09/15 0345 07/10/15 0547 07/11/15 0530  AST 27  --   --   --   --   --   --   ALT 10*  --   --   --   --   --   --   ALKPHOS 89  --   --   --   --   --   --   BILITOT 0.4  --   --   --   --   --   --  PROT 6.8  --   --   --   --   --   --   ALBUMIN 2.7*  < > 2.0* 2.1* 2.1* 2.3* 2.2*  < > = values in this interval not displayed. No results for input(s): LIPASE, AMYLASE in the last 168 hours. No results for input(s): AMMONIA in the last 168 hours. CBC:  Recent Labs Lab 07/04/15 1025 07/05/15 0335 07/06/15 0345 07/07/15 0410 07/08/15 0403 07/10/15 0547  WBC 9.7 7.5 8.1 6.2 6.3 7.3  NEUTROABS 6.3  --   --   --   --   --   HGB 10.3* 8.7* 8.2* 8.3* 8.0* 8.5*  HCT 31.3* 25.9* 24.7* 25.4* 24.7* 26.7*  MCV 96.6 96.3 95.7 97.3 96.5 95.4  PLT 698* 579* 557* 537* 500* 581*   Cardiac Enzymes:  Recent Labs Lab 07/04/15 1025 07/04/15 1620 07/04/15 2220  TROPONINI <0.03 0.04* <0.03   BNP (last 3 results)  Recent Labs  12/03/14 1650  BNP 133.7*    ProBNP (last 3 results) No  results for input(s): PROBNP in the last 8760 hours.  CBG:  Recent Labs Lab 07/10/15 1702 07/10/15 1943 07/10/15 2357 07/11/15 0407 07/11/15 0754  GLUCAP 111* 115* 142* 116* 114*    No results found for this or any previous visit (from the past 240 hour(s)).   Studies: No results found.  Scheduled Meds: . antiseptic oral rinse  7 mL Mouth Rinse QID  . baclofen  5 mg Oral TID  . chlorhexidine gluconate  15 mL Mouth Rinse BID  . cloNIDine  0.2 mg Transdermal Weekly  . cycloSPORINE  1 drop Both Eyes BID  . escitalopram  20 mg Per Tube Daily  . guaiFENesin  5 mL Oral BID  . lacosamide (VIMPAT) IV  150 mg Intravenous Q12H  . metoprolol  7.5 mg Intravenous 4 times per day  . pantoprazole (PROTONIX) IV  40 mg Intravenous Q12H  . piperacillin-tazobactam (ZOSYN)  IV  3.375 g Intravenous Q8H  . QUEtiapine  25 mg Oral QHS  . tamsulosin  0.4 mg Oral Daily   Continuous Infusions:    Principal Problem:   Cardiopulmonary arrest (HCC) Active Problems:   Depression   Mental retardation   Infantile cerebral palsy (HCC)   Anemia   Seizure disorder (HCC)   Esophagitis   HCAP (healthcare-associated pneumonia)   Acute upper GI bleed   GI bleed   Ventilator dependent (HCC)   Acute renal failure (HCC)   Acute respiratory failure with hypoxia (HCC)   Nausea & vomiting   Respiratory failure (HCC)   Pneumonitis   Dysphagia, pharyngoesophageal phase   Acute blood loss anemia   Leukocytosis   Palliative care encounter   DNR (do not resuscitate) discussion   Dysphagia    Time spent: 25  min    VANN, JESSICA  [facility] Pager [phone]. If 7PM-7AM, please contact night-coverage at [email or web address], password [facility] 07/11/2015, 9:13 AM  LOS: 21 days

## 2015-07-12 DIAGNOSIS — R1310 Dysphagia, unspecified: Secondary | ICD-10-CM

## 2015-07-12 DIAGNOSIS — I469 Cardiac arrest, cause unspecified: Secondary | ICD-10-CM

## 2015-07-12 LAB — RENAL FUNCTION PANEL
ANION GAP: 8 (ref 5–15)
Albumin: 2.3 g/dL — ABNORMAL LOW (ref 3.5–5.0)
BUN: 9 mg/dL (ref 6–20)
CALCIUM: 8.4 mg/dL — AB (ref 8.9–10.3)
CO2: 20 mmol/L — AB (ref 22–32)
Chloride: 112 mmol/L — ABNORMAL HIGH (ref 101–111)
Creatinine, Ser: 0.95 mg/dL (ref 0.61–1.24)
GFR calc Af Amer: >60 mL/min (ref >60)
GFR calc non Af Amer: >60 mL/min (ref >60)
GLUCOSE: 86 mg/dL (ref 65–99)
Phosphorus: 3.7 mg/dL (ref 2.5–4.6)
Potassium: 4.2 mmol/L (ref 3.5–5.1)
SODIUM: 140 mmol/L (ref 135–145)

## 2015-07-12 LAB — GLUCOSE, CAPILLARY
GLUCOSE-CAPILLARY: 117 mg/dL — AB (ref 65–99)
GLUCOSE-CAPILLARY: 110 mg/dL — AB (ref 65–99)
Glucose-Capillary: 113 mg/dL — ABNORMAL HIGH (ref 65–99)
Glucose-Capillary: 85 mg/dL (ref 65–99)
Glucose-Capillary: 85 mg/dL (ref 65–99)

## 2015-07-12 LAB — MAGNESIUM: MAGNESIUM: 1.6 mg/dL — AB (ref 1.7–2.4)

## 2015-07-12 MED ORDER — BISACODYL 10 MG RE SUPP: 10.0000 mg | Freq: Every day | RECTAL | Status: AC | PRN

## 2015-07-12 MED ORDER — AMOXICILLIN-POT CLAVULANATE 875-125 MG PO TABS: 1.0000 | ORAL_TABLET | Freq: Two times a day (BID) | ORAL | Status: AC

## 2015-07-12 MED ORDER — LORAZEPAM 0.5 MG PO TABS: 0.5000 mg | ORAL_TABLET | Freq: Three times a day (TID) | ORAL | Status: AC | PRN

## 2015-07-12 NOTE — Progress Notes (Signed)
Report called to Tammy at [facility].

## 2015-07-12 NOTE — Discharge Summary (Signed)
Physician Discharge Summary  Colin Bradley:[id number] DOB: 04-22-1951 DOA: 06/20/2015  PCP: Sheela Stack, MD  Admit date: 06/20/2015 Discharge date: 07/12/2015  Time spent:45 minutes  Recommendations for Outpatient Follow-up:  1. 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[place])   Anemia   Seizure disorder ([place])   Esophagitis   HCAP (healthcare-associated pneumonia)   Acute upper GI bleed   GI bleed   Ventilator dependent (HCC)   Acute renal failure (HCC)   Acute respiratory failure with hypoxia (HCC)   Nausea & vomiting   Respiratory failure (HCC)   Pneumonitis   Dysphagia, pharyngoesophageal phase   Acute blood loss anemia   Leukocytosis   Palliative care encounter   DNR (do not resuscitate) discussion   Dysphagia   Discharge Condition: Stable  Diet recommendation: Dysphagia 1 diet with honey thick liquids  Filed Weights   07/07/15 0422 07/08/15 0400 07/09/15 0500  Weight: 61.4 kg (135 lb 5.8 oz) 60.2 kg (132 lb 11.5 oz) 59.1 kg (130 lb 4.7 oz)    History of present illness:  Chief complaint: vomiting blood HISTORY OF PRESENT ILLNESS:  64 yo WM with existing CP/MR with a history of HTN, erosive esophagitis, previous GI bleed who presented to [facility] from SNF coffee ground emesis and inability to protect airway and maintain adequate O2 saturations. He was intubated per EDP, started on PPI drip and GI was consulted. He is a ward of state and is a full code. State appt guardian is Colin Bradley DDS [phone]. He was admitted to ICU under PCCM service.   Hospital Course:  [name] is a 64 year old gentleman with a past medical history of cerebral palsy, intellectual disability, seizure disorder who is currently ward of the state, initially admitted to the pulmonary critical care service on  06/20/2015 when he presented as a transfer from his facility to the emergency department at [facility] for coffee-ground emesis. Due to inability to protect airway and maintain oxygen saturations he underwent rapid sequence intubation in the emergency department and admitted to the intensive care unit. He was started on IV Protonix. Acute respiratory failure is felt to be secondary to aspiration. On 06/20/2015 he underwent upper endoscopy which showed mild erosive changes without evidence of active bleed. It was felt that GI bleed could have been related to Mallory-Weiss tear in setting of multiple episodes of nausea and vomiting. He was extubated on 06/26/2015. On 07/04/2015 he went into cardiopulmonary arrest undergoing CPR that was delivered for approximately 1 minute with return of spontaneous circulation. He was transferred back to the intensive care unit. This is likely related to ongoing issues with aspiration and his inability to protect airway. Palliative care was consulted for facilitation of discussion of goals of care with guardian. He was extubated on 07/07/2015.  His CODE STATUS was changed to a DO NOT RESUSCITATE by Palliative medicine and Pulmonary critical care service. -Since then he was started on dysphagia 1 diet after speech therapy evaluations, is currently being treated for aspiration pneumonia again. However clinically stable at this time and discussions about goals of care as detailed below  Detailed course: 1. Recurrent Aspiration 64 year old gentleman with a history of intellectual disability, cerebral palsy, having evidence of recurrent aspirations that likely lead to cardiopulmonary arrest during this hospitalization. -Last chest x-ray performed on 07/08/2015 showed increasing density in the right  lung base representing infiltrate and He spiked a temperature of 101.1 on 07/09/2015, Palliative care has been involved in his care for . 2 weeks -Started on IV ZOsyn 10/3, On  07/10/2015 case was discussed with SLP who reported that swallow function appeared similar and consistent with cerebral palsy. His diet has been advanced to Dysphagia 1 currently tolerating this but remains at high risk for ongoing aspiration. -Dr.Golding and myself met with patients legal guardians from [place] today, we made clear recommendations for no feeding tube, comfort feeding and to complete this course of oral antibiotics but to consider not treating recurrent aspiration and to allow for natural death with comfort and dignity as this is irreversible and a feeding tube at this time would not prevent aspiration. - recommendations were made to avoid rehospitalization if possible -Antibiotics changed to oral Augmentin at discharge today  2. Upper  GI bleed. - [name] initially presented with hematemesis undergoing upper endoscopy on 06/20/2015 that revealed mild gastritis. There was suspicion that Mallory-Weiss tear may have been the source of bleed. -He has not had further episodes of GI bleeding.  3. Dysphagia. -He continues to have issues with recurrent aspirations, last chest x-ray showing increasing right lower lobe infiltrate. -I don't think that a PEG tube would improve his quality of life or even prevent future aspirations from occurring. He seems to have difficulties managing his own secretions. -As mentioned above his diet was advanced to Dysphagia 1 per SLP's recommendations on 07/10/2015.   4. Acute hypoxemic respiratory failure -Patient requiring rapid sequence intubation 2 during this hospitalization likely resulting from aspiration. -resolved  5. History of seizure disorder. -Continue Vimpat  6. Goals of care  DNR, comfort care  No feeding tube  Comfort Feeding  Stop IV antibiotics, complete course of oral abx if tolerated, do not treat another PNA and allow for a natural death to occur with comfort and dignity-this is an irreversible condition. A feeding tube  will not prevent aspiration. Avoid Rehospitalization, MOST form filled -Hospice referral made this was clearly discussed by myself and Dr. Hilma Favors with his 2 guardians from DSS   Procedures: 9/13 Intubated & admitted Upper GIB 9/13 EGD w/ hiatal hernia but no source of active bleeding 9/14 hyperchloremic acidosis  9/15 tachycardic, lopressor restarted. Continued on rx for pneumonia  9/16 WBC trending down, hgb stable, still w/ NAG metabolic acidosis.  9/18 hgb down w/o active bleeding. Patient more awake. 9/19 Extubated  9/20 To TRH 3/43 Asystolic arrest, intubated on floor. Required ~1 minute of CPR with ROSC. Tx back to ICU 9/28 Pt more alert, not tolerating weaning due to low volumes 9/30:Extubated  Consultations:  Palliative medicine  Discharge Exam: Filed Vitals:   07/12/15 0520  BP: 156/80  Pulse: 62  Temp: 97.9 F (36.6 C)  Resp: 20    General: alert, awake, oriented to self only Cardiovascular: S1S2/RRR Respiratory: few ronchi at R base  Discharge Instructions   Discharge Instructions    Diet - low sodium heart healthy    Complete by:  As directed      Increase activity slowly    Complete by:  As directed           Current Discharge Medication List    START taking these medications   Details  bisacodyl (DULCOLAX) 10 MG suppository Place 1 suppository (10 mg total) rectally daily as needed for moderate constipation. Qty: 12 suppository, Refills: 0      CONTINUE these medications which have CHANGED  Details  amoxicillin-clavulanate (AUGMENTIN) 875-125 MG tablet Take 1 tablet by mouth 2 (two) times daily. X 5 days Qty: 10 tablet, Refills: 0    LORazepam (ATIVAN) 0.5 MG tablet Take 1 tablet (0.5 mg total) by mouth every 8 (eight) hours as needed for anxiety. Qty: 30 tablet, Refills: 0      CONTINUE these medications which have NOT CHANGED   Details  albuterol (PROVENTIL) (2.5 MG/3ML) 0.083% nebulizer solution Take 3 mLs (2.5 mg  total) by nebulization every 6 (six) hours as needed for wheezing or shortness of breath. Qty: 75 mL, Refills: 12    baclofen (LIORESAL) 10 MG tablet Take 5 mg by mouth 3 (three) times daily.    cloNIDine (CATAPRES) 0.2 MG tablet Take 1 tablet (0.2 mg total) by mouth 2 (two) times daily. Qty: 60 tablet, Refills: 2    cycloSPORINE (RESTASIS) 0.05 % ophthalmic emulsion Place 1 drop into both eyes 2 (two) times daily.    dutasteride (AVODART) 0.5 MG capsule Take 0.5 mg by mouth daily.    Lacosamide (VIMPAT) 150 MG TABS Take 150 mg by mouth 2 (two) times daily.    metoprolol (LOPRESSOR) 50 MG tablet Take 1 tablet (50 mg total) by mouth 2 (two) times daily. Qty: 60 tablet, Refills: 0    Multiple Vitamin (DAILY VITE PO) Take 1 tablet by mouth daily with breakfast.     ondansetron (ZOFRAN) 4 MG tablet Take 4 mg by mouth 3 (three) times daily as needed for nausea or vomiting.    potassium chloride 20 MEQ TBCR Take 20 mEq by mouth daily. Qty: 30 tablet, Refills: 0    promethazine (PHENERGAN) 12.5 MG tablet Take 12.5 mg by mouth every 12 (twelve) hours as needed for nausea or vomiting.    QUEtiapine (SEROQUEL) 25 MG tablet Take 25 mg by mouth at bedtime.    ranitidine (ZANTAC) 150 MG tablet Take 150 mg by mouth 2 (two) times daily.    tamsulosin (FLOMAX) 0.4 MG CAPS capsule Take 0.4 mg by mouth daily.    escitalopram (LEXAPRO) 20 MG tablet Take 20 mg by mouth daily with breakfast.     food thickener (THICK IT) POWD Take 1 g by mouth 2 (two) times daily.    Maltodextrin-Xanthan Gum (RESOURCE THICKENUP CLEAR) POWD 2 times a day with meals Qty: 30 Can, Refills: 2      STOP taking these medications     guaiFENesin (ROBITUSSIN) 100 MG/5ML SOLN      lisinopril (PRINIVIL,ZESTRIL) 30 MG tablet      prazosin (MINIPRESS) 2 MG capsule      sucralfate (CARAFATE) 1 GM/10ML suspension      Dutasteride-Tamsulosin HCl (JALYN) 0.5-0.4 MG CAPS      fosfomycin (MONUROL) 3 G PACK       saccharomyces boulardii (FLORASTOR) 250 MG capsule        No Known Allergies Follow-up Information    Follow up with Sheela Stack, MD. Schedule an appointment as soon as possible for a visit in 1 week.   Specialty:  Endocrinology   Contact information:   [place] [id number] [phone]        The results of significant diagnostics from this hospitalization (including imaging, microbiology, ancillary and laboratory) are listed below for reference.    Significant Diagnostic Studies: Dg Chest 1 View  07/09/2015   CLINICAL DATA:  Aspiration.  Patient with cerebral palsy.  EXAM: CHEST 1 VIEW  COMPARISON:  07/08/2015 and prior studies  FINDINGS: Cardiomediastinal silhouette  is unchanged.  Bilateral airspace opacities have slightly decreased with improved aeration in the lung bases.  There is no evidence of pneumothorax.  No other interval change identified.  IMPRESSION: Slightly decreased bilateral airspace opacities and improved bibasilar aeration.   Electronically Signed   By: Margarette Canada M.D.   On: 07/09/2015 10:01   Dg Abd 1 View  07/04/2015   CLINICAL DATA:  Assess positioning of orogastric tube.  EXAM: ABDOMEN - 1 VIEW  COMPARISON:  KUB of July 02, 2015  FINDINGS: The orogastric tube tip projects in the region of the distal stomach with the proximal port in the gastric body. There is contrast within the ascending and proximal transverse colon and rectosigmoid.  IMPRESSION: The orogastric tube appears to lie in the stomach with the tip at the level of the pylorus.   Electronically Signed   By: David  Martinique M.D.   On: 07/04/2015 17:08   Dg Abd 1 View  06/23/2015   CLINICAL DATA:  Encounter for orogastric tube placement.  EXAM: ABDOMEN - 1 VIEW  COMPARISON:  June 20, 2015.  FINDINGS: The bowel gas pattern is normal. Orogastric tube tip is seen in the expected position of the proximal stomach. No radio-opaque calculi or other significant radiographic  abnormality are seen.  IMPRESSION: Orogastric tube tip is seen in expected position of the proximal stomach. No evidence of bowel obstruction or ileus.   Electronically Signed   By: Marijo Conception, M.D.   On: 06/23/2015 16:54   Dg Abd 1 View  06/20/2015   CLINICAL DATA:  64 year old male with enteric tube placement.  EXAM: ABDOMEN - 1 VIEW  COMPARISON:  Radiograph dated 06/20/2015  FINDINGS: An enteric tube is partially visualized extending to the left upper abdomen with tip over the gastric bubble likely in the body of the stomach. There is no bowel dilatation or evidence of obstruction. No radiopaque calculi identified. There is degenerative changes of the spine. No acute fracture.  IMPRESSION: Enteric tube with tip over the gastric bubble likely within the stomach.   Electronically Signed   By: Anner Crete M.D.   On: 06/20/2015 18:32   US Renal  06/20/2015   CLINICAL DATA:  Acute renal failure.  EXAM: RENAL / URINARY TRACT ULTRASOUND COMPLETE  COMPARISON:  None.  FINDINGS: Right Kidney:  Length: 9.4 cm. 5 mm exophytic cyst is seen in midpole. Echogenicity within normal limits. No mass or hydronephrosis visualized.  Left Kidney:  Length: 9.8 cm. Echogenicity within normal limits. No mass or hydronephrosis visualized.  Bladder:  Urinary bladder is decompressed secondary to Foley catheter.  Incidental note is made of cholelithiasis.  IMPRESSION: Small right renal cyst. Otherwise kidneys appear normal. Incidental note is made of cholelithiasis.   Electronically Signed   By: Marijo Conception, M.D.   On: 06/20/2015 08:55   Dg Chest Port 1 View  07/08/2015   CLINICAL DATA:  Acute respiratory failure  EXAM: PORTABLE CHEST 1 VIEW  COMPARISON:  Radiograph 07/07/2015  FINDINGS: Stable cardiac silhouette. Interval removal of NG tube. The bilateral pleural effusions and basilar atelectasis. Increased density at the RIGHT lung base.  IMPRESSION: 1. Increasing density RIGHT lung base representing infiltrate or  edema. 2. Stable bibasilar atelectasis and effusions.   Electronically Signed   By: Suzy Bouchard M.D.   On: 07/08/2015 07:25   Dg Chest Port 1 View  07/07/2015   CLINICAL DATA:  Acute respiratory failure  EXAM: PORTABLE CHEST 1 VIEW  COMPARISON:  Yesterday  FINDINGS: Endotracheal tube tip between the clavicular heads and carina. Orogastric tube reaches the stomach at least.  Unchanged indistinct bibasilar opacities. Stable interstitial coarsening. No air leak.  Stable normal heart size and aortic contour.  IMPRESSION: 1. Stable positioning of endotracheal and orogastric tubes. 2. Unchanged bibasilar atelectasis or pneumonia.   Electronically Signed   By: Monte Fantasia M.D.   On: 07/07/2015 06:31   Dg Chest Port 1 View  07/06/2015   CLINICAL DATA:  Acute respiratory failure  EXAM: PORTABLE CHEST 1 VIEW  COMPARISON:  Yesterday  FINDINGS: Endotracheal tube tip between the clavicular heads and carina. The orogastric tube tip is not visualized but likely at the pylorus.  Persistent bibasilar airspace opacity with small pleural effusions. No air leak or collapse. Normal heart size and aortic contours.  IMPRESSION: 1. Stable positioning of endotracheal and orogastric tubes. 2. Unchanged bibasilar atelectasis or pneumonia with small pleural effusions.   Electronically Signed   By: Monte Fantasia M.D.   On: 07/06/2015 05:46   Dg Chest Port 1 View  07/05/2015   CLINICAL DATA:  Respiratory failure, healthcare associated pneumonia, intubated patient, gastrointestinal bleeding  EXAM: PORTABLE CHEST 1 VIEW  COMPARISON:  Portable chest x-ray of April 03, 2015  FINDINGS: The lungs are adequately inflated. The pulmonary interstitial markings remain increased but have improved somewhat. The hemidiaphragms are now partially visible. The cardiac silhouette remains enlarged. The central pulmonary vascularity is less engorged.  The endotracheal tube tip lies 5.6 cm above the carina. The esophagogastric tube tip and  proximal port project below the GE junction.  IMPRESSION: Improving pulmonary interstitial edema or pneumonia. Small bilateral pleural effusions are less conspicuous. The support tubes are in reasonable position.   Electronically Signed   By: David  Martinique M.D.   On: 07/05/2015 07:22   Dg Chest Port 1 View  07/04/2015   CLINICAL DATA:  Respiratory failure.  EXAM: PORTABLE CHEST 1 VIEW  COMPARISON:  July 03, 2015.  FINDINGS: Stable cardiomediastinal silhouette. Distal tip of nasogastric tube is seen in the region of the gastroesophageal junction. No pneumothorax is noted. Stable diffuse lung opacities are noted concerning for edema, or less likely pneumonia, with probable mild associated pleural effusions. Bony thorax is unremarkable.  IMPRESSION: Stable bilateral diffuse lung opacities are noted most consistent with edema with probable mild associated pleural effusions. Distal tip of nasogastric tube is seen in the region of the gastroesophageal junction.   Electronically Signed   By: Marijo Conception, M.D.   On: 07/04/2015 11:10   Dg Chest Port 1 View  07/03/2015   CLINICAL DATA:  64 year old male with leukocytosis. Subsequent encounter.  EXAM: PORTABLE CHEST 1 VIEW  COMPARISON:  06/30/2015.  FINDINGS: Change in appearance of asymmetric airspace disease greater on the right with bilateral pleural effusions suspected. This may represent asymmetric pulmonary edema. Given the leukocytosis, infectious infiltrate, particularly in the lung bases cannot be excluded.  Feeding tube is in place with the tip not imaged on the present exam.  Cardiomegaly.  No pneumothorax.  IMPRESSION: Change in appearance of asymmetric airspace disease greater on the right with bilateral pleural effusions suspected. This may represent asymmetric pulmonary edema. Given the leukocytosis, infectious infiltrate, particularly in the lung bases cannot be excluded.   Electronically Signed   By: Genia Del M.D.   On: 07/03/2015 09:20    Dg Chest Port 1 View  06/30/2015   CLINICAL DATA:  Aspiration, coughing, hypertension, cerebral palsy, seizures  EXAM: PORTABLE  CHEST 1 VIEW  COMPARISON:  Portable exam 0227 hours compared 06/22/2015  FINDINGS: Normal heart size, mediastinal contours, and pulmonary vascularity.  Hazy BILATERAL infiltrates greatest at lung bases question multifocal pneumonia or aspiration pneumonitis.  No gross pleural effusion or pneumothorax.  Bones unremarkable.  Feeding tube coiled at the level of the diaphragm, cannot exclude within hiatal hernia.  IMPRESSION: Bibasilar infiltrates question multifocal pneumonia versus aspiration pneumonitis.  Feeding tube coiled at diaphragm, question within a hiatal hernia.   Electronically Signed   By: Lavonia Dana M.D.   On: 06/30/2015 02:41   Dg Chest Port 1 View  06/22/2015   CLINICAL DATA:  Respiratory failure.  EXAM: PORTABLE CHEST - 1 VIEW  COMPARISON:  06/20/2015.  FINDINGS: Endotracheal tube in stable position. NG tube tip in the stomach. NG tube side hole is at the gastroesophageal junction. Slight advancement of NG tube should be considered. Mediastinum and hilar structures are normal. Persistent but improving right mid and right lower lobe infiltrate and right pleural effusion. Persistent right base atelectasis. Persistent left lower lobe mild atelectasis and/or infiltrate. Heart size stable. No pulmonary venous congestion. No pneumothorax.  IMPRESSION: 1. Endotracheal tube in good anatomic position . NG tube tip in the stomach. NG tube side hole at the gastroesophageal junction. Slight advanced of the NG tube should be considered . 2. Persistent but improving right mid and right lower lobe infiltrate and right pleural effusion. Persistent right base atelectasis. 3. Persistent left lower lobe mild atelectasis and/or infiltrate .   Electronically Signed   By: Marcello Moores  Register   On: 06/22/2015 07:19   Dg Chest Port 1 View  06/20/2015   CLINICAL DATA:  Intubation.  EXAM:  PORTABLE CHEST - 1 VIEW  COMPARISON:  Earlier this day at 0148 hour  FINDINGS: 0609 hour: Endotracheal tube is 3.2 cm from the carina. Enteric tube in place, tip below the diaphragm not included in the field of view. Progressive opacities in the right mid lower lung zone. Developing left basilar opacity. Cardiomediastinal contours are partially obscured. Question developing right pleural effusion. No pneumothorax.  IMPRESSION: 1. Endotracheal tube in place. Enteric tube tip below the diaphragm, not included in the field of view. 2. Progressive opacity in the right mid lower lung zone, may reflect worsening pneumonia, atelectasis, or developing right pleural effusion. Increasing left basilar opacity, concerning for atelectasis. Alternatively, aspiration could have this appearance.   Electronically Signed   By: Jeb Levering M.D.   On: 06/20/2015 06:52   Dg Chest Port 1 View  06/20/2015   CLINICAL DATA:  64 year old male with vomiting  EXAM: PORTABLE ABDOMEN - 1 VIEW; PORTABLE CHEST - 1 VIEW  COMPARISON:  Chest radiograph dated 227 views abdominal radiograph dated 2283 and  FINDINGS: Single portable view of the chest demonstrate patchy areas of nodular and ground-glass opacity in the mid to lower lung field on the right most compatible with pneumonia. A small right pleural effusion may be present. The left lung is clear. No pneumothorax. The cardiac silhouette is within normal limits.  Single-view of the abdomen demonstrate air within the stomach. No dilated small bowel loops identified. The no free air. Copious amount of stool noted throughout the colon and rectum. No radiopaque calculi identified there is  There is degenerative changes of spine.  No acute fracture.  IMPRESSION: Right mid to lower lung field opacities most compatible with pneumonia. Clinical correlation and follow-up recommended.  Constipation with possible fecal impaction within the rectum. No bowel obstruction.  Electronically Signed   By:  Anner Crete M.D.   On: 06/20/2015 02:31   Dg Abd Portable 1v  07/02/2015   CLINICAL DATA:  Reposition of feeding tube  EXAM: PORTABLE ABDOMEN - 1 VIEW  COMPARISON:  07/02/2015  FINDINGS: Feeding tube has been advanced, coiling in the distal stomach with the tip in the antrum of the stomach. Nonobstructive bowel gas pattern.  IMPRESSION: Feeding tube coils in the distal stomach with the tip likely in the antrum.   Electronically Signed   By: Rolm Baptise M.D.   On: 07/02/2015 07:22   Dg Abd Portable 1v  07/02/2015   CLINICAL DATA:  Feeding tube placement.  EXAM: PORTABLE ABDOMEN - 1 VIEW  COMPARISON:  06/30/2015  FINDINGS: Feeding tube tip in the left upper quadrant consistent with location in the upper stomach. Residual contrast material throughout the colon. Degenerative changes in the spine.  IMPRESSION: Feeding tube tip projects over the upper stomach.   Electronically Signed   By: Lucienne Capers M.D.   On: 07/02/2015 05:30   Dg Abd Portable 1v  06/30/2015   CLINICAL DATA:  Feeding tube placement.  EXAM: PORTABLE ABDOMEN - 1 VIEW  COMPARISON:  06/27/2015  FINDINGS: Feeding tube tip projects over the right upper quadrant consistent with location in the mid/distal stomach. Gas and stool throughout the colon. No small or large bowel distention. Thoracolumbar scoliosis and degenerative changes.  IMPRESSION: Feeding tube tip projects over the mid/ distal stomach.   Electronically Signed   By: Lucienne Capers M.D.   On: 06/30/2015 01:22   Dg Abd Portable 1v  06/27/2015   CLINICAL DATA:  Feeding tube placement.  EXAM: PORTABLE ABDOMEN - 1 VIEW  COMPARISON:  06/23/2015.  FINDINGS: Feeding tube tip in the proximal stomach. Normal bowel gas pattern. Moderate levoconvex thoracolumbar rotary scoliosis and degenerative changes.  IMPRESSION: Feeding tube tip in the proximal stomach.   Electronically Signed   By: Claudie Revering M.D.   On: 06/27/2015 14:28   Dg Abd Portable 1v  06/20/2015   CLINICAL DATA:   Orogastric tube placement.  EXAM: PORTABLE ABDOMEN - 1 VIEW  COMPARISON:  06/20/2015  FINDINGS: Enteric catheter has been retracted and side hole now is at the level of the mid thorax, and distal tip at the expected location of distal esophagus. Airspace consolidation is seen within the right lower lobe.  Bowel gas pattern is poorly visualized.  IMPRESSION: Interval retraction of the enteric catheter.   Electronically Signed   By: Fidela Salisbury M.D.   On: 06/20/2015 16:58   Dg Abd Portable 1v  06/20/2015   CLINICAL DATA:  Chest pain. Seizures with vomiting and respiratory distress. Nasogastric tube placement. Initial encounter.  EXAM: PORTABLE ABDOMEN - 1 VIEW  COMPARISON:  06/20/2015 radiographs.  FINDINGS: 0910 hours. Nasogastric tube tip overlies the mid stomach. The visualized bowel gas pattern is nonobstructive. There is persistent right basilar airspace disease suspicious for aspiration.  IMPRESSION: Nasogastric tube tip overlies the mid stomach. Persistent right basilar airspace disease.   Electronically Signed   By: Richardean Sale M.D.   On: 06/20/2015 09:37   Dg Abd Portable 1v  06/20/2015   CLINICAL DATA:  64 year old male with vomiting  EXAM: PORTABLE ABDOMEN - 1 VIEW; PORTABLE CHEST - 1 VIEW  COMPARISON:  Chest radiograph dated 227 views abdominal radiograph dated 2283 and  FINDINGS: Single portable view of the chest demonstrate patchy areas of nodular and ground-glass opacity in the mid to lower lung  field on the right most compatible with pneumonia. A small right pleural effusion may be present. The left lung is clear. No pneumothorax. The cardiac silhouette is within normal limits.  Single-view of the abdomen demonstrate air within the stomach. No dilated small bowel loops identified. The no free air. Copious amount of stool noted throughout the colon and rectum. No radiopaque calculi identified there is  There is degenerative changes of spine.  No acute fracture.  IMPRESSION: Right mid  to lower lung field opacities most compatible with pneumonia. Clinical correlation and follow-up recommended.  Constipation with possible fecal impaction within the rectum. No bowel obstruction.   Electronically Signed   By: Anner Crete M.D.   On: 06/20/2015 02:31   Dg Swallowing Func-speech Pathology  07/01/2015   Valere Dross, CCC-SLP     07/01/2015  1:39 PM  Objective Swallowing Evaluation:  (MBS)  Patient Details  Name: KARLIS CREGG MRN: [id number] Date of Birth: [birth year]  Today's Date: 07/01/2015 Time: SLP Start Time (ACUTE ONLY): 1300-SLP Stop Time (ACUTE  ONLY): 1315 SLP Time Calculation (min) (ACUTE ONLY): 15 min  Past Medical History:  Past Medical History  Diagnosis Date  . Seizures   . Cerebral palsy   . Hypertension   . Mental retardation   . Depression   . Nephrolithiasis   . BPH (benign prostatic hyperplasia)   . GI bleed 2009    coffee ground emesis, erosive esophagitis on EGD by Dr Olevia Perches  . Esophageal stricture 2009    not dilated during the EGD, biopsy benign:  no Barretts  . Depression with anxiety    Past Surgical History:  Past Surgical History  Procedure Laterality Date  . Basal cell carcinoma excision  2002    forehead  . Squamous cell carcinoma excision  2004    lip  . Bowen dz  2002    right cheek   . Esophagogastroduodenoscopy  06/14/2012    Procedure: ESOPHAGOGASTRODUODENOSCOPY (EGD);  Surgeon: Milus Banister, MD;  Location: [facility];  Service: Endoscopy;   Laterality: N/A;  . Multiple extractions with alveoloplasty N/A 06/20/2014    Procedure: MULTIPLE EXTRACTIONS TEETH NUMBER TWO, TEN, TWELVE,  EIGHTEEN, TWENTY-ONE, TWENTY-TWO, TWENTY-NINE, THIRTY-TWO;   Surgeon: Gae Bon, DDS;  Location: [facility];  Service: Oral  Surgery;  Laterality: N/A;  . Esophagogastroduodenoscopy N/A 06/20/2015    Procedure: ESOPHAGOGASTRODUODENOSCOPY (EGD);  Surgeon: Manus Gunning, MD;  Location: [facility];  Service:  Gastroenterology;  Laterality: N/A;   HPI:  Other Pertinent  Information: 64 yo male with h/o CP, intellectual  disability, coffee ground emesis 9/13 - required intubation.  Pt  extubated 06/26/15, swallow evaluation ordered.  Pt found to have  right mid and lower lobe infiltrate.  EGD showing HH and no  active bleed 9/13.    Pt resides at group home and is on a  regular/chopped diet prior to admission.  He is a ward of the  state.  No Data Recorded  Assessment / Plan / Recommendation CHL IP CLINICAL IMPRESSIONS 07/01/2015  Therapy Diagnosis Moderate oral phase dysphagia;Severe oral phase  dysphagia;Moderate pharyngeal phase dysphagia  Clinical Impression Moderate-severe oral dysphagi described as  poor labial closure and arrythymical lingual movements and  decreased oral cohesion characteristic of cerebral palsy.  Moderate sensorimotor pharyngeal dysphagia exhibiting decreased  sensation leading swallow initiation primaily at pyriform sinuses  with thinner viscocities. Moderate (sometimes max) vallecular and  pyriform sinus residue reduced with cue for second swallow.  Laryngeal  penetration (silent with nectar) and eventual  aspiraiton. Pt unable to produce effective volitional cough.  Initiating a Dys 1 diet texture and honey thick liquids is not  without risk, however recommend these modified textures with ST  follow up for follow through of compensatory strategies: crush  meds, sit upright, small bites/sips (tsp size preferred) and ask  pt to swallow twice.         CHL IP TREATMENT RECOMMENDATION 12/05/2014  Treatment Recommendations Therapy as outlined in treatment plan  below     CHL IP DIET RECOMMENDATION 06/30/2015  SLP Diet Recommendations (None)  Liquid Administration via (None)  Medication Administration Via alternative means  Compensations (None)  Postural Changes and/or Swallow Maneuvers (None)     CHL IP OTHER RECOMMENDATIONS 06/30/2015  Recommended Consults (None)  Oral Care Recommendations Oral care QID  Other Recommendations (None)     CHL IP FOLLOW UP  RECOMMENDATIONS 06/29/2015  Follow up Recommendations (No Data)     CHL IP FREQUENCY AND DURATION 06/27/2015  Speech Therapy Frequency (ACUTE ONLY) min 2x/week  Treatment Duration 2 weeks     Pertinent Vitals/Pain none    SLP Swallow Goals No flowsheet data found.  No flowsheet data found.    CHL IP REASON FOR REFERRAL 07/01/2015  Reason for Referral Objectively evaluate swallowing function               No flowsheet data found.         Houston Siren 07/01/2015, 1:38 PM  Orbie Pyo Colvin Caroli.Ed CCC-SLP Pager [phone]      Microbiology: No results found for this or any previous visit (from the past 240 hour(s)).   Labs: Basic Metabolic Panel:  Recent Labs Lab 07/08/15 0403 07/08/15 0404 07/09/15 0345 07/10/15 0547 07/11/15 0530 07/12/15 0558  NA 140 139 142 138 141 140  K 4.1 4.1 4.0 4.1 4.7 4.2  CL 113* 115* 114* 110 113* 112*  CO2 19* 20* 20* 20* 19* 20*  GLUCOSE 98 95 93 99 92 86  BUN _0 CREATININE 0.77 0.76 0.88 0.93 1.08 0.95  CALCIUM 8.1* 7.9* 8.4* 8.4* 8.4* 8.4*  MG 1.6*  --  1.8 1.7 1.7 1.6*  PHOS  --  3.7 3.3 4.0 3.7 3.7   Liver Function Tests:  Recent Labs Lab 07/08/15 0404 07/09/15 0345 07/10/15 0547 07/11/15 0530 07/12/15 0558  ALBUMIN 2.1* 2.1* 2.3* 2.2* 2.3*   No results for input(s): LIPASE, AMYLASE in the last 168 hours. No results for input(s): AMMONIA in the last 168 hours. CBC:  Recent Labs Lab 07/06/15 0345 07/07/15 0410 07/08/15 0403 07/10/15 0547  WBC 8.1 6.2 6.3 7.3  HGB 8.2* 8.3* 8.0* 8.5*  HCT 24.7* 25.4* 24.7* 26.7*  MCV 95.7 97.3 96.5 95.4  PLT 557* 537* 500* 581*   Cardiac Enzymes: No results for input(s): CKTOTAL, CKMB, CKMBINDEX, TROPONINI in the last 168 hours. BNP: BNP (last 3 results)  Recent Labs  12/03/14 1650  BNP 133.7*    ProBNP (last 3 results) No results for input(s): PROBNP in the last 8760 hours.  CBG:  Recent Labs Lab 07/11/15 2019 07/12/15 0007 07/12/15 0424 07/12/15 0756  07/12/15 1156  GLUCAP 163* 85 85 117* 113*       Signed:  Shakyia Bosso  [facility] 07/12/2015, 2:23 PM

## 2015-07-12 NOTE — Progress Notes (Signed)
Speech Language Pathology Treatment: Dysphagia  Patient Details Name: Colin Bradley MRN: [id number] DOB: April 21, 1951 Today's Date: 07/12/2015 Time: 0277-4128 SLP Time Calculation (min) (ACUTE ONLY): 14 min  Assessment / Plan / Recommendation Clinical Impression  Note plan for comfort intake with no repeated hospitalizations.  SLP assisted pt with icecream mixed with ice and thickened milk.  Pt with good tolerance with delayed swallow responses with icecream with no s/s of aspiration.  Suspect aspiration with ice chip however evidenced by significant cough with mild discomfort apparent characterized by his facial expression.  Pt recovered adequately after coughing and accepted more intake with tolerance.  SlP administered po only via tsp to maximize comfort.   Recommend continue pureed/honey thick with strict precautions for comfort.  Please assure pt sitting upright after meals given possible regurgitation episode with aspiration event during hospitalization.   SLP to sign off as pt care plan in place and signs posted with precautions for education.  Thanks.    HPI Other Pertinent Information: 64 yo male with h/o CP, intellectual disability, coffee ground emesis 9/13 - required intubation.  Pt extubated 06/26/15, swallow evaluation ordered.  Pt found to have right mid and lower lobe infiltrate.  EGD showing HH and no active bleed 9/13.    Pt resides at group home and is on a regular/chopped diet prior to admission.  He is a ward of the state. MBS 9/24 recommended Dys 1, honey thick liquids (high aspiration risk). Pt coded July 17, 2023 (possible aspiration?), intubated 06/16/09/2029. Swallow assessment reordered; conversation with Dr. Coralyn Pear to reassess at bedside for ability to initiate recommeded po's following MBS 9/24 with known risks.    Pertinent Vitals Pain Assessment: No/denies pain  SLP Plan  All goals met    Recommendations Diet recommendations: Dysphagia 1 (puree);Honey-thick liquid Liquids provided  via: Teaspoon;Cup Medication Administration: Crushed with puree Supervision: Full supervision/cueing for compensatory strategies Compensations: Slow rate;Small sips/bites;Multiple dry swallows after each bite/sip Postural Changes and/or Swallow Maneuvers: Seated upright 90 degrees;Upright 30-60 min after meal              Oral Care Recommendations: Oral care BID Follow up Recommendations: None Plan: All goals met    GO     Colin Bradley, [place] [facility]

## 2015-07-12 NOTE — Clinical Social Work Placement (Signed)
   CLINICAL SOCIAL WORK PLACEMENT  NOTE  Date:  07/12/2015  Patient Details  Name: Colin Bradley MRN: [id number] Date of Birth: [birth year]  Clinical Social Work is seeking post-discharge placement for this patient at the Assisted Living Facility [facility]) level of care (*CSW will initial, date and re-position this form in  chart as items are completed):  No   Patient/family provided with [facility] Health Clinical Social Work Department's list of facilities offering this level of care within the geographic area requested by the patient (or if unable, by the patient's family).  Yes   Patient/family informed of their freedom to choose among providers that offer the needed level of care, that participate in Medicare, Medicaid or managed care program needed by the patient, have an available bed and are willing to accept the patient.      Patient/family informed of [facility]'s ownership interest in [facility] and [facility], as well as of the fact that they are under no obligation to receive care at these facilities.  PASRR submitted to EDS on       PASRR number received on       Existing PASRR number confirmed on       FL2 transmitted to all facilities in geographic area requested by pt/family on       FL2 transmitted to all facilities within larger geographic area on       Patient informed that his/her managed care company has contracts with or will negotiate with certain facilities, including the following:        Yes   Patient/family informed of bed offers received.  Patient chooses bed at  [facility])     Physician recommends and patient chooses bed at      Patient to be transferred to  [facility]) on 07/12/15.  Patient to be transferred to facility by  [facility])     Patient family notified on 07/12/15 of transfer.  Name of family member notified:  [facility]     PHYSICIAN       Additional Comment:     _______________________________________________ Loleta Dicker, LCSW 07/12/2015, 3:48 PM

## 2015-07-12 NOTE — Progress Notes (Signed)
Referral made to [facility], got a callback from [facility] stating [facility] preferred [facility] as provider. Contacted [facility] at [phone], faxed information to them at [phone].

## 2015-07-12 NOTE — Progress Notes (Signed)
   Daily Progress Note   Patient Name: Colin Bradley       Date: 07/12/2015 DOB: [birth year]  Age: 64 y.o. MRN#: [id number] Attending Physician: Domenic Polite, MD Primary Care Physician: Sheela Stack, MD Admit Date: 06/20/2015  Reason for Consultation/Follow-up: Establishing goals of care, Family-clinician negotiation and Other  Problem List:  Patient Active Problem List   Diagnosis Date Noted  . Palliative care encounter 07/05/2015  . DNR (do not resuscitate) discussion 07/05/2015  . Dysphagia   . Cardiopulmonary arrest ([place]) 07/04/2015  . Leukocytosis   . Dysphagia, pharyngoesophageal phase 06/28/2015  . Acute blood loss anemia   . Pneumonitis   . Respiratory failure ([place])   . Acute upper GI bleed 06/20/2015  . GI bleed 06/20/2015  . Ventilator dependent ([place]) 06/20/2015  . Acute renal failure ([place])   . Acute respiratory failure with hypoxia ([place])   . Nausea & vomiting   . Acute encephalopathy 12/02/2014  . Hypothermia 12/02/2014  . HCAP (healthcare-associated pneumonia) 11/05/2014  . Esophagitis 06/15/2012  . Hiatal hernia 06/14/2012  . Anemia 06/13/2012  . Seizure disorder ([place]) 06/13/2012  . Heme positive stool 06/12/2012  . Depression 10/19/2008  . Mental retardation 10/19/2008  . 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 ESOPHAGITIS 10/19/2008  . HIATAL HERNIA 10/19/2008  . BENIGN PROSTATIC HYPERTROPHY, HX OF 10/19/2008     Palliative Care Assessment & Plan   I have been actively involved in the care of this patient since admission. On 07/07/15 I participated in extensive discussion with the medical team including attending physician, nurses, NPs and other caregivers to provide ethical and humane treatment of Colin Bradley who is under county guardianship. Today I met with Colin Bradley and Colin Bradley from the county and legal guardians.  My Recommendations are as follows:  1. Patient was made DNR on 07/07/15 by two physician concensus: dr. Hilma Favors and  Dr. Vaughan Browner.  2. MOST Form:  DNR, comfort care  No feeding tube  Comfort Feeding  Stop IV antibiotics, complete course of oral abx if tolerated, do not treat another PNA and allow for a natural death to occur with comfort and dignity-this is an irreversible condition. A feeding tube will not prevent aspiration.  Avoid Rehospitalization  3. Hospice Referral -[facility]    Time: 10-1035AM 35 minutes  I completed additional paperwork and NP Davis Gourd has spent additional prologed time in direct contact with patient and guardians.  Greater than 50%  of this time was spent counseling and coordinating care related to the above assessment and plan.   Acquanetta Chain, DO  07/12/2015, 10:42 AM  Please contact Palliative Medicine Team phone at [phone] for questions and concerns.

## 2015-07-12 NOTE — Progress Notes (Signed)
Referral made to [facility] per request

## 2015-08-30 ENCOUNTER — Encounter (HOSPITAL_COMMUNITY): Payer: Self-pay | Admitting: Emergency Medicine

## 2015-08-30 ENCOUNTER — Emergency Department (HOSPITAL_COMMUNITY): Payer: Medicare Other

## 2015-08-30 ENCOUNTER — Inpatient Hospital Stay (HOSPITAL_COMMUNITY)
Admission: EM | Admit: 2015-08-30 | Discharge: 2015-09-07 | DRG: 177 | Disposition: E | Payer: Medicare Other | Attending: Family Medicine | Admitting: Family Medicine

## 2015-08-30 DIAGNOSIS — N400 Enlarged prostate without lower urinary tract symptoms: Secondary | ICD-10-CM | POA: Diagnosis present

## 2015-08-30 DIAGNOSIS — Z515 Encounter for palliative care: Secondary | ICD-10-CM | POA: Diagnosis present

## 2015-08-30 DIAGNOSIS — I10 Essential (primary) hypertension: Secondary | ICD-10-CM | POA: Diagnosis present

## 2015-08-30 DIAGNOSIS — F79 Unspecified intellectual disabilities: Secondary | ICD-10-CM | POA: Diagnosis present

## 2015-08-30 DIAGNOSIS — J690 Pneumonitis due to inhalation of food and vomit: Principal | ICD-10-CM | POA: Diagnosis present

## 2015-08-30 DIAGNOSIS — J9601 Acute respiratory failure with hypoxia: Secondary | ICD-10-CM | POA: Diagnosis present

## 2015-08-30 DIAGNOSIS — R0602 Shortness of breath: Secondary | ICD-10-CM | POA: Diagnosis not present

## 2015-08-30 DIAGNOSIS — Z8701 Personal history of pneumonia (recurrent): Secondary | ICD-10-CM

## 2015-08-30 DIAGNOSIS — Z87442 Personal history of urinary calculi: Secondary | ICD-10-CM

## 2015-08-30 DIAGNOSIS — F418 Other specified anxiety disorders: Secondary | ICD-10-CM | POA: Diagnosis present

## 2015-08-30 DIAGNOSIS — Z85828 Personal history of other malignant neoplasm of skin: Secondary | ICD-10-CM

## 2015-08-30 DIAGNOSIS — G809 Cerebral palsy, unspecified: Secondary | ICD-10-CM | POA: Diagnosis present

## 2015-08-30 DIAGNOSIS — J9690 Respiratory failure, unspecified, unspecified whether with hypoxia or hypercapnia: Secondary | ICD-10-CM | POA: Diagnosis present

## 2015-08-30 DIAGNOSIS — G40909 Epilepsy, unspecified, not intractable, without status epilepticus: Secondary | ICD-10-CM | POA: Diagnosis present

## 2015-08-30 DIAGNOSIS — Z79899 Other long term (current) drug therapy: Secondary | ICD-10-CM

## 2015-08-30 DIAGNOSIS — Z66 Do not resuscitate: Secondary | ICD-10-CM | POA: Diagnosis present

## 2015-08-30 LAB — BASIC METABOLIC PANEL
ANION GAP: 11 (ref 5–15)
BUN: 26 mg/dL — ABNORMAL HIGH (ref 6–20)
CALCIUM: 9.5 mg/dL (ref 8.9–10.3)
CO2: 23 mmol/L (ref 22–32)
Chloride: 103 mmol/L (ref 101–111)
Creatinine, Ser: 1.19 mg/dL (ref 0.61–1.24)
GLUCOSE: 111 mg/dL — AB (ref 65–99)
POTASSIUM: 5 mmol/L (ref 3.5–5.1)
SODIUM: 137 mmol/L (ref 135–145)

## 2015-08-30 LAB — CBC WITH DIFFERENTIAL/PLATELET
BASOS ABS: 0 10*3/uL (ref 0.0–0.1)
BASOS PCT: 0 %
EOS PCT: 2 %
Eosinophils Absolute: 0.3 10*3/uL (ref 0.0–0.7)
HCT: 37 % — ABNORMAL LOW (ref 39.0–52.0)
Hemoglobin: 12.1 g/dL — ABNORMAL LOW (ref 13.0–17.0)
LYMPHS PCT: 12 %
Lymphs Abs: 1.4 10*3/uL (ref 0.7–4.0)
MCH: 30.3 pg (ref 26.0–34.0)
MCHC: 32.7 g/dL (ref 30.0–36.0)
MCV: 92.5 fL (ref 78.0–100.0)
MONO ABS: 0.8 10*3/uL (ref 0.1–1.0)
Monocytes Relative: 7 %
Neutro Abs: 9.6 10*3/uL — ABNORMAL HIGH (ref 1.7–7.7)
Neutrophils Relative %: 79 %
PLATELETS: 210 10*3/uL (ref 150–400)
RBC: 4 MIL/uL — AB (ref 4.22–5.81)
RDW: 14.6 % (ref 11.5–15.5)
WBC: 12.2 10*3/uL — AB (ref 4.0–10.5)

## 2015-08-30 MED ORDER — CLINDAMYCIN PHOSPHATE 600 MG/50ML IV SOLN
600.0000 mg | Freq: Once | INTRAVENOUS | Status: AC
  Administered 2015-08-30: 600 mg via INTRAVENOUS
  Filled 2015-08-30: qty 50

## 2015-08-30 NOTE — ED Notes (Signed)
Nurse currently drawing labs 

## 2015-08-30 NOTE — Hospital Discharge Follow-Up (Signed)
PCP:  Julian Hy, MD    Referring provider  Little   Chief Complaint: Cough difficulty breathing  HPI: Colin Bradley is a 64 y.o. male   has a past medical history of Seizures (HCC); Cerebral palsy (HCC); Hypertension; Mental retardation; Depression; Nephrolithiasis; BPH (benign prostatic hyperplasia); GI bleed (2009); Esophageal stricture (2009); and Depression with anxiety.   Presented with    Patient has known history of mental retardation with a history of erosive esophagitis resulting in GI bleed this for which she has been admitted in September requiring ICU stay and intubation secondary to inability to protect airway complicated by aspiration pneumonia. His hospital stay was complicated by another cardiopulmonary arrest resulting in CPR. Palliative care consult was called his CODE STATUS was changed to DO NOT RESUSCITATE since then he was started on dysphagia 1 diet and discharged to hospice. The plan was to avoid feeding tube and continue with comfort feeding. The plan was to avoid rehospitalization if possible.   Patient today was at [facility] was drinking honey thick liquids and became choked recurrently coughing. EMS was called and he was brought in to emergency department prior to contacting hospice nurse. Patient initially was hypoxic down to 88% but improved with oxygen chest x-ray was done suspicious for persistent aspiration. Received IV clindamycin. Hospitalist was called for admission for acute respiratory failure it was found that patient has MOST form signed MOST Form:  DNR, comfort care  No feeding tube  Comfort Feeding  Stop IV antibiotics, complete course of oral abx if tolerated, do not treat another PNA and allow for a natural death to occur with comfort and dignity-this is an irreversible condition. A feeding tube will not prevent aspiration. Avoid Rehospitalization  Hospice director was contacted and stated this point there's no  oxygen available at the nursing facility and has to be ordered. Patient also has no pain management or air hunger management as needed ordered. As well as then no orders for avoiding  Rehospitalization on file.  Review of Systems:  Unable to provide any review of records  Past Medical History: Past Medical History  Diagnosis Date  . Seizures (HCC)   . Cerebral palsy (HCC)   . Hypertension   . Mental retardation   . Depression   . Nephrolithiasis   . BPH (benign prostatic hyperplasia)   . GI bleed 2009    coffee ground emesis, erosive esophagitis on EGD by Dr Juanda Chance  . Esophageal stricture 2009    not dilated during the EGD, biopsy benign:  no Barretts  . Depression with anxiety    Past Surgical History  Procedure Laterality Date  . Basal cell carcinoma excision  2002    forehead  . Squamous cell carcinoma excision  2004    lip  . Bowen dz  2002    right cheek   . Esophagogastroduodenoscopy  06/14/2012    Procedure: ESOPHAGOGASTRODUODENOSCOPY (EGD);  Surgeon: Rachael Fee, MD;  Location: [facility] ENDOSCOPY;  Service: Endoscopy;  Laterality: N/A;  . Multiple extractions with alveoloplasty N/A 06/20/2014    Procedure: MULTIPLE EXTRACTIONS TEETH NUMBER TWO, TEN, TWELVE, EIGHTEEN, TWENTY-ONE, TWENTY-TWO, TWENTY-NINE, THIRTY-TWO;  Surgeon: Georgia Lopes, DDS;  Location: [facility] OR;  Service: Oral Surgery;  Laterality: N/A;  . Esophagogastroduodenoscopy N/A 06/20/2015    Procedure: ESOPHAGOGASTRODUODENOSCOPY (EGD);  Surgeon: Ruffin Frederick, MD;  Location: [facility] ENDOSCOPY;  Service: Gastroenterology;  Laterality: N/A;     Medications: Prior to Admission medications   Medication Sig Start Date  End Date Taking? Authorizing Provider  baclofen (LIORESAL) 10 MG tablet Take 10 mg by mouth 3 (three) times daily.    Yes Historical Provider, MD  cloNIDine (CATAPRES) 0.2 MG tablet Take 1 tablet (0.2 mg total) by mouth 2 (two) times daily. 11/07/14  Yes Richarda Overlie, MD  cycloSPORINE (RESTASIS) 0.05 %  ophthalmic emulsion Place 1 drop into both eyes 2 (two) times daily.   Yes Historical Provider, MD  dutasteride (AVODART) 0.5 MG capsule Take 0.5 mg by mouth daily.   Yes Historical Provider, MD  Lacosamide (VIMPAT) 150 MG TABS Take 150 mg by mouth 2 (two) times daily.   Yes Historical Provider, MD  Maltodextrin-Xanthan Gum (RESOURCE THICKENUP CLEAR) POWD 2 times a day with meals Patient taking differently: Take 1 g by mouth 2 (two) times daily. with meals 11/07/14  Yes Richarda Overlie, MD  metoCLOPramide (REGLAN) 10 MG tablet Take 10 mg by mouth 2 (two) times daily.   Yes Historical Provider, MD  metoprolol (LOPRESSOR) 50 MG tablet Take 1 tablet (50 mg total) by mouth 2 (two) times daily. 12/07/14  Yes Ripudeep Jenna Luo, MD  Multiple Vitamin (DAILY VITE PO) Take 1 tablet by mouth daily with breakfast.    Yes Historical Provider, MD  PARoxetine (PAXIL) 20 MG tablet Take 20 mg by mouth daily.   Yes Historical Provider, MD  potassium chloride 20 MEQ TBCR Take 20 mEq by mouth daily. 11/07/14  Yes Richarda Overlie, MD  QUEtiapine (SEROQUEL) 25 MG tablet Take 25 mg by mouth at bedtime.   Yes Historical Provider, MD  ranitidine (ZANTAC) 150 MG tablet Take 150 mg by mouth 2 (two) times daily.   Yes Historical Provider, MD  sucralfate (CARAFATE) 1 GM/10ML suspension Take 1 g by mouth 4 (four) times daily -  with meals and at bedtime.   Yes Historical Provider, MD  tamsulosin (FLOMAX) 0.4 MG CAPS capsule Take 0.4 mg by mouth daily.   Yes Historical Provider, MD  albuterol (PROVENTIL) (2.5 MG/3ML) 0.083% nebulizer solution Take 3 mLs (2.5 mg total) by nebulization every 6 (six) hours as needed for wheezing or shortness of breath. 11/07/14   Richarda Overlie, MD  amoxicillin-clavulanate (AUGMENTIN) 875-125 MG tablet Take 1 tablet by mouth 2 (two) times daily. X 5 days Patient not taking: Reported on 09-01-15 07/12/15   Zannie Cove, MD  bisacodyl (DULCOLAX) 10 MG suppository Place 1 suppository (10 mg total) rectally daily as  needed for moderate constipation. 07/12/15   Zannie Cove, MD  LORazepam (ATIVAN) 0.5 MG tablet Take 1 tablet (0.5 mg total) by mouth every 8 (eight) hours as needed for anxiety. 07/12/15   Zannie Cove, MD  ondansetron (ZOFRAN) 4 MG tablet Take 4 mg by mouth 3 (three) times daily as needed for nausea or vomiting.    Historical Provider, MD  promethazine (PHENERGAN) 12.5 MG tablet Take 12.5 mg by mouth every 12 (twelve) hours as needed for nausea or vomiting.    Historical Provider, MD    Allergies:  No Known Allergies  Social History:     From facility [facility]   reports that he has never smoked. He has never used smokeless tobacco. He reports that he does not drink alcohol or use illicit drugs.    Family History: Family history is unknown by patient.    Physical Exam: Patient Vitals for the past 24 hrs:  BP Temp Temp src Pulse Resp SpO2  Sep 01, 2015 1903 - - - - - 92 %  2015-09-01 1844 175/93  mmHg - - 81 - (!) 88 %  09/16/2015 1838 (!) 184/104 mmHg 97.3 F (36.3 C) Oral 82 20 (!) 83 %    1. General:  in No Acute distress 2. Psychological: Alert but not Oriented 3. Head/ENT:     Dry Mucous Membranes                          Head Non traumatic, neck supple                           Poor Dentition 4. SKIN:  decreased Skin turgor,  Skin clean Dry and intact no rash 5. Heart: Regular rate and rhythm no Murmur, Rub or gallop 6. Lungs: Clear to auscultation bilaterally, no wheezes or crackles   7. Abdomen: Soft, non-tender, Non distended 8. Lower extremities: no clubbing, cyanosis, or edema 9. Neurologically Grossly intact, moving all 4 extremities equally 10. MSK: Normal range of motion  body mass index is unknown because there is no weight on file.   Labs on Admission:   Results for orders placed or performed during the hospital encounter of September 16, 2015 (from the past 24 hour(s))  CBC with Differential     Status: Abnormal   Collection Time: 09/16/15  8:57  PM  Result Value Ref Range   WBC 12.2 (H) 4.0 - 10.5 K/uL   RBC 4.00 (L) 4.22 - 5.81 MIL/uL   Hemoglobin 12.1 (L) 13.0 - 17.0 g/dL   HCT 16.1 (L) 09.6 - 04.5 %   MCV 92.5 78.0 - 100.0 fL   MCH 30.3 26.0 - 34.0 pg   MCHC 32.7 30.0 - 36.0 g/dL   RDW 40.9 81.1 - 91.4 %   Platelets 210 150 - 400 K/uL   Neutrophils Relative % 79 %   Neutro Abs 9.6 (H) 1.7 - 7.7 K/uL   Lymphocytes Relative 12 %   Lymphs Abs 1.4 0.7 - 4.0 K/uL   Monocytes Relative 7 %   Monocytes Absolute 0.8 0.1 - 1.0 K/uL   Eosinophils Relative 2 %   Eosinophils Absolute 0.3 0.0 - 0.7 K/uL   Basophils Relative 0 %   Basophils Absolute 0.0 0.0 - 0.1 K/uL  Basic metabolic panel     Status: Abnormal   Collection Time: 09-16-2015  8:57 PM  Result Value Ref Range   Sodium 137 135 - 145 mmol/L   Potassium 5.0 3.5 - 5.1 mmol/L   Chloride 103 101 - 111 mmol/L   CO2 23 22 - 32 mmol/L   Glucose, Bld 111 (H) 65 - 99 mg/dL   BUN 26 (H) 6 - 20 mg/dL   Creatinine, Ser 7.82 0.61 - 1.24 mg/dL   Calcium 9.5 8.9 - 95.6 mg/dL   GFR calc non Af Amer >60 >60 mL/min   GFR calc Af Amer >60 >60 mL/min   Anion gap 11 5 - 15    UA not obtained  No results found for: HGBA1C  CrCl cannot be calculated (Unknown ideal weight.).  BNP (last 3 results) No results for input(s): PROBNP in the last 8760 hours.  Other results:  I have pearsonaly reviewed this: ECG REPORT not obtained   There were no vitals filed for this visit.   Cultures:    Component Value Date/Time   SDES TRACHEAL ASPIRATE 06/21/2015 0850   SPECREQUEST Normal 06/21/2015 0850   CULT  06/21/2015 0850    MODERATE KLEBSIELLA PNEUMONIAE 16 Note: Confirmed  Extended Spectrum Beta-Lactamase Producer (ESBL) CRITICAL RESULT CALLED TO, READ BACK BY AND VERIFIED WITH: DENISE ALDRIDGE 9 17 @ 0955 BY PARDA Performed at [facility]    REPTSTATUS 06/24/2015 FINAL 06/21/2015 0850     Radiological Exams on Admission: Dg Chest 2 View  Sep 15, 2015  CLINICAL DATA:   Cough after swallowing thick liquid. Cervical palsy. EXAM: CHEST  2 VIEW COMPARISON:  July 09, 2015 FINDINGS: There is focal infiltrate in the right base. The lungs elsewhere clear. Heart size and pulmonary vascularity are normal. No adenopathy. No bone lesions. IMPRESSION: Focal infiltrate right base. Given the clinical history, aspiration is a differential consideration. Infectious pneumonia is also a differential consideration. Lungs elsewhere clear. Electronically Signed   By: Bretta Bang III M.D.   On: 09-15-15 19:36    Chart has been reviewed  Family not at  Bedside    Assessment/Plan  64 year old male with history of mental retardation and chronic aspiration currently on hospice comfort care presents with another episode of aspiration and acute respiratory failure with hypoxia with chest x-ray worrisome for recurrent aspiration pneumonia as per records comfort care only  Present on Admission:  . Respiratory failure (HCC) - Currently comfortable . Aspiration pneumonia (HCC)- Augmentin PO . Acute respiratory failure with hypoxia [facility]) -spoke with Hospice Nurse on call   and  Facility director Irving Burton on discharge will need to make sure patient has PRN oxygen available and Morphine for air hunger. Irving Burton states she will order. Will call tomorrow to confirm when is going to be available.  Spoke with DSS [phone] Joneen Roach [phone] and  Dorita Fray Program Manager [phone] who confirms that patient is completely calm forte care has been discussed in the past with palliative care consult MOST form has been signed and as follows:  MOST Form:  DNR, comfort care  No feeding tube  Comfort Feeding  Stop IV antibiotics, complete course of oral abx if tolerated, do not treat another PNA and allow for a natural death to occur with comfort and dignity-this is an irreversible condition. A feeding tube will not prevent aspiration. Avoid Rehospitalization   CODE  STATUS:   DNR/DNI as per records  Disposition:                           Back to current facility when oxygen available                             Other plan as per orders.  I have spent a total of 65 min on this admission  Ajani Schnieders 09/15/2015, 10:30 PM  [facility]  Pager [phone]   after 2 AM please page floor coverage PA If 7AM-7PM, please contact the day team taking care of the patient  [email or web address]  Password [email or web address]

## 2015-08-30 NOTE — ED Notes (Signed)
[facility] that was drinking honey-thick liquids and became choked. Pt is continually coughing with inability to cough anything up. Alert and oriented per baseline. Pt has hx of cerebral palsy and MR.

## 2015-08-30 NOTE — ED Notes (Signed)
Bed: [id number] Expected date:  Expected time:  Means of arrival:  Comments: EMS- elderly, aspiration?

## 2015-08-30 NOTE — ED Provider Notes (Signed)
CSN: [id number]     Arrival date & time 09/24/15  1833 History   First MD Initiated Contact with Patient 09-24-2015 1843     Chief Complaint  Patient presents with  . Aspiration     (Consider location/radiation/quality/duration/timing/severity/associated sxs/prior Treatment) HPI Comments: 64 year old male with extensive past medical history including CP/MR, hypertension, GI bleed, seizures who presents with aspiration. History obtained from  hospice center as patient unable to provide history. Caregiver reports that just prior to arrival, the patient was eating honey thick liquids for dinner when he aspirated. He has had significant coughing, choking, and vomiting since the aspiration event. The patient recently completed antibiotics for aspiration pneumonia. Caregiver is unsure whether he is on oxygen at baseline.  The history is provided by a caregiver.    Past Medical History  Diagnosis Date  . Seizures (HCC)   . Cerebral palsy (HCC)   . Hypertension   . Mental retardation   . Depression   . Nephrolithiasis   . BPH (benign prostatic hyperplasia)   . GI bleed 2009    coffee ground emesis, erosive esophagitis on EGD by Dr Juanda Chance  . Esophageal stricture 2009    not dilated during the EGD, biopsy benign:  no Barretts  . Depression with anxiety    Past Surgical History  Procedure Laterality Date  . Basal cell carcinoma excision  2002    forehead  . Squamous cell carcinoma excision  2004    lip  . Bowen dz  2002    right cheek   . Esophagogastroduodenoscopy  06/14/2012    Procedure: ESOPHAGOGASTRODUODENOSCOPY (EGD);  Surgeon: Rachael Fee, MD;  Location: [facility] ENDOSCOPY;  Service: Endoscopy;  Laterality: N/A;  . Multiple extractions with alveoloplasty N/A 06/20/2014    Procedure: MULTIPLE EXTRACTIONS TEETH NUMBER TWO, TEN, TWELVE, EIGHTEEN, TWENTY-ONE, TWENTY-TWO, TWENTY-NINE, THIRTY-TWO;  Surgeon: Georgia Lopes, DDS;  Location: [facility] OR;  Service: Oral Surgery;  Laterality: N/A;  .  Esophagogastroduodenoscopy N/A 06/20/2015    Procedure: ESOPHAGOGASTRODUODENOSCOPY (EGD);  Surgeon: Ruffin Frederick, MD;  Location: [facility] ENDOSCOPY;  Service: Gastroenterology;  Laterality: N/A;   Family History  Problem Relation Age of Onset  . Family history unknown: Yes   Social History  Substance Use Topics  . Smoking status: Never Smoker   . Smokeless tobacco: Never Used  . Alcohol Use: No    Review of Systems  Unable to perform ROS: Patient nonverbal       Allergies  Review of patient's allergies indicates no known allergies.  Home Medications   Prior to Admission medications   Medication Sig Start Date End Date Taking? Authorizing Provider  baclofen (LIORESAL) 10 MG tablet Take 10 mg by mouth 3 (three) times daily.    Yes Historical Provider, MD  cloNIDine (CATAPRES) 0.2 MG tablet Take 1 tablet (0.2 mg total) by mouth 2 (two) times daily. 11/07/14  Yes Richarda Overlie, MD  cycloSPORINE (RESTASIS) 0.05 % ophthalmic emulsion Place 1 drop into both eyes 2 (two) times daily.   Yes Historical Provider, MD  dutasteride (AVODART) 0.5 MG capsule Take 0.5 mg by mouth daily.   Yes Historical Provider, MD  Lacosamide (VIMPAT) 150 MG TABS Take 150 mg by mouth 2 (two) times daily.   Yes Historical Provider, MD  Maltodextrin-Xanthan Gum (RESOURCE THICKENUP CLEAR) POWD 2 times a day with meals Patient taking differently: Take 1 g by mouth 2 (two) times daily. with meals 11/07/14  Yes Richarda Overlie, MD  metoCLOPramide (REGLAN) 10 MG tablet Take 10  mg by mouth 2 (two) times daily.   Yes Historical Provider, MD  metoprolol (LOPRESSOR) 50 MG tablet Take 1 tablet (50 mg total) by mouth 2 (two) times daily. 12/07/14  Yes Ripudeep Jenna Luo, MD  Multiple Vitamin (DAILY VITE PO) Take 1 tablet by mouth daily with breakfast.    Yes Historical Provider, MD  PARoxetine (PAXIL) 20 MG tablet Take 20 mg by mouth daily.   Yes Historical Provider, MD  potassium chloride 20 MEQ TBCR Take 20 mEq by mouth daily.  11/07/14  Yes Richarda Overlie, MD  QUEtiapine (SEROQUEL) 25 MG tablet Take 25 mg by mouth at bedtime.   Yes Historical Provider, MD  ranitidine (ZANTAC) 150 MG tablet Take 150 mg by mouth 2 (two) times daily.   Yes Historical Provider, MD  sucralfate (CARAFATE) 1 GM/10ML suspension Take 1 g by mouth 4 (four) times daily -  with meals and at bedtime.   Yes Historical Provider, MD  tamsulosin (FLOMAX) 0.4 MG CAPS capsule Take 0.4 mg by mouth daily.   Yes Historical Provider, MD  albuterol (PROVENTIL) (2.5 MG/3ML) 0.083% nebulizer solution Take 3 mLs (2.5 mg total) by nebulization every 6 (six) hours as needed for wheezing or shortness of breath. 11/07/14   Richarda Overlie, MD  amoxicillin-clavulanate (AUGMENTIN) 875-125 MG tablet Take 1 tablet by mouth 2 (two) times daily. X 5 days Patient not taking: Reported on 09/01/2015 07/12/15   Zannie Cove, MD  bisacodyl (DULCOLAX) 10 MG suppository Place 1 suppository (10 mg total) rectally daily as needed for moderate constipation. 07/12/15   Zannie Cove, MD  LORazepam (ATIVAN) 0.5 MG tablet Take 1 tablet (0.5 mg total) by mouth every 8 (eight) hours as needed for anxiety. 07/12/15   Zannie Cove, MD  ondansetron (ZOFRAN) 4 MG tablet Take 4 mg by mouth 3 (three) times daily as needed for nausea or vomiting.    Historical Provider, MD  promethazine (PHENERGAN) 12.5 MG tablet Take 12.5 mg by mouth every 12 (twelve) hours as needed for nausea or vomiting.    Historical Provider, MD   BP 175/93 mmHg  Pulse 81  Temp(Src) 97.3 F (36.3 C) (Oral)  Resp 20  SpO2 92% Physical Exam  Constitutional:  Awake, alert, elderly man with frequent coughing and gagging  HENT:  Head: Normocephalic and atraumatic.  Mouth/Throat: Oropharynx is clear and moist.  Moist mucous membranes  Eyes: Conjunctivae are normal. Pupils are equal, round, and reactive to light.  Neck: Neck supple.  Cardiovascular: Normal rate, regular rhythm and normal heart sounds.   No murmur  heard. Pulmonary/Chest:  Increased work of breathing during coughing episodes, crackles in bilateral lungs  Abdominal: Soft. Bowel sounds are normal. He exhibits no distension. There is no tenderness.  Musculoskeletal:  1+ pitting edema BLE  Neurological: He is alert. He exhibits normal muscle tone.  Fluent speech  Skin: Skin is warm and dry.  Nursing note and vitals reviewed.   ED Course  Procedures (including critical care time) Labs Review Labs Reviewed  CBC WITH DIFFERENTIAL/PLATELET - Abnormal; Notable for the following:    WBC 12.2 (*)    RBC 4.00 (*)    Hemoglobin 12.1 (*)    HCT 37.0 (*)    Neutro Abs 9.6 (*)    All other components within normal limits  BASIC METABOLIC PANEL - Abnormal; Notable for the following:    Glucose, Bld 111 (*)    BUN 26 (*)    All other components within normal limits  Imaging Review Dg Chest 2 View  Oct 23, 2014  CLINICAL DATA:  Cough after swallowing thick liquid. Cervical palsy. EXAM: CHEST  2 VIEW COMPARISON:  July 09, 2015 FINDINGS: There is focal infiltrate in the right base. The lungs elsewhere clear. Heart size and pulmonary vascularity are normal. No adenopathy. No bone lesions. IMPRESSION: Focal infiltrate right base. Given the clinical history, aspiration is a differential consideration. Infectious pneumonia is also a differential consideration. Lungs elsewhere clear. Electronically Signed   By: Bretta BangWilliam  Woodruff III M.D.   On: Oct 23, 2014 19:36      EKG Interpretation None      Medications  clindamycin (CLEOCIN) IVPB 600 mg (600 mg Intravenous New Bag/Given 2014/12/29 2029)    MDM   Final diagnoses:  Aspiration pneumonia of right lower lobe, unspecified aspiration pneumonia type (HCC)   Pt w/ CP/MR and hx of aspiration pneumonia presents with coughing mild respiratory distress after aspirating during dinner. On exam, he had frequent coughing and crackles in bilateral lungs. O2 sat 92% on 3 L nasal cannula. Obtained CXR  which shows right lower lung infiltrate consistent with pneumonia. Basic lab work was unremarkable. Gave the patient IV clindamycin as I suspect aspiration pneumonia given his recurrent history of the same. On reexamination, the patient has had less coughing and is stable at 96% on 3 L nasal cannula. Given hypoxia, I discussed admission with [facility]ist and patient admitted to general medicine for further care.   Laurence Spatesachel Morgan Little, MD 2014/12/29 [phone]

## 2015-08-31 DIAGNOSIS — J9601 Acute respiratory failure with hypoxia: Secondary | ICD-10-CM

## 2015-08-31 DIAGNOSIS — G40909 Epilepsy, unspecified, not intractable, without status epilepticus: Secondary | ICD-10-CM

## 2015-08-31 DIAGNOSIS — F79 Unspecified intellectual disabilities: Secondary | ICD-10-CM

## 2015-08-31 DIAGNOSIS — Z79899 Other long term (current) drug therapy: Secondary | ICD-10-CM | POA: Diagnosis not present

## 2015-08-31 DIAGNOSIS — J690 Pneumonitis due to inhalation of food and vomit: Secondary | ICD-10-CM | POA: Diagnosis not present

## 2015-08-31 DIAGNOSIS — Z66 Do not resuscitate: Secondary | ICD-10-CM | POA: Diagnosis present

## 2015-08-31 DIAGNOSIS — Z85828 Personal history of other malignant neoplasm of skin: Secondary | ICD-10-CM | POA: Diagnosis not present

## 2015-08-31 DIAGNOSIS — G809 Cerebral palsy, unspecified: Secondary | ICD-10-CM | POA: Diagnosis present

## 2015-08-31 DIAGNOSIS — Z87442 Personal history of urinary calculi: Secondary | ICD-10-CM | POA: Diagnosis not present

## 2015-08-31 DIAGNOSIS — R0602 Shortness of breath: Secondary | ICD-10-CM | POA: Diagnosis present

## 2015-08-31 DIAGNOSIS — I10 Essential (primary) hypertension: Secondary | ICD-10-CM | POA: Diagnosis present

## 2015-08-31 DIAGNOSIS — F418 Other specified anxiety disorders: Secondary | ICD-10-CM | POA: Diagnosis present

## 2015-08-31 DIAGNOSIS — Z8701 Personal history of pneumonia (recurrent): Secondary | ICD-10-CM | POA: Diagnosis not present

## 2015-08-31 DIAGNOSIS — N400 Enlarged prostate without lower urinary tract symptoms: Secondary | ICD-10-CM | POA: Diagnosis present

## 2015-08-31 DIAGNOSIS — Z515 Encounter for palliative care: Secondary | ICD-10-CM | POA: Diagnosis present

## 2015-08-31 LAB — STREP PNEUMONIAE URINARY ANTIGEN: STREP PNEUMO URINARY ANTIGEN: NEGATIVE

## 2015-08-31 MED ORDER — SCOPOLAMINE 1 MG/3DAYS TD PT72
1.0000 | MEDICATED_PATCH | TRANSDERMAL | Status: DC
  Administered 2015-08-31: 1.5 mg via TRANSDERMAL
  Filled 2015-08-31: qty 1

## 2015-08-31 MED ORDER — DEXTROSE-NACL 5-0.45 % IV SOLN
INTRAVENOUS | Status: DC
  Administered 2015-08-31 – 2015-09-01 (×4): via INTRAVENOUS

## 2015-08-31 MED ORDER — METOPROLOL TARTRATE 1 MG/ML IV SOLN
5.0000 mg | Freq: Four times a day (QID) | INTRAVENOUS | Status: DC
  Administered 2015-08-31 – 2015-09-02 (×5): 5 mg via INTRAVENOUS
  Filled 2015-08-31 (×10): qty 5

## 2015-08-31 MED ORDER — ONDANSETRON HCL 4 MG/2ML IJ SOLN
4.0000 mg | Freq: Four times a day (QID) | INTRAMUSCULAR | Status: DC | PRN
  Administered 2015-09-01: 4 mg via INTRAVENOUS
  Filled 2015-08-31 (×2): qty 2

## 2015-08-31 MED ORDER — RESOURCE THICKENUP CLEAR PO POWD
1.0000 g | ORAL | Status: DC | PRN
  Filled 2015-08-31: qty 125

## 2015-08-31 MED ORDER — CYCLOSPORINE 0.05 % OP EMUL
1.0000 [drp] | Freq: Two times a day (BID) | OPHTHALMIC | Status: DC
  Administered 2015-08-31 – 2015-09-01 (×5): 1 [drp] via OPHTHALMIC
  Filled 2015-08-31 (×7): qty 1

## 2015-08-31 MED ORDER — FAMOTIDINE 20 MG PO TABS
10.0000 mg | ORAL_TABLET | Freq: Every day | ORAL | Status: DC
Start: 2015-08-31 — End: 2015-09-02
  Administered 2015-08-31: 10 mg via ORAL
  Filled 2015-08-31 (×3): qty 1

## 2015-08-31 MED ORDER — LORAZEPAM 0.5 MG PO TABS
0.5000 mg | ORAL_TABLET | Freq: Three times a day (TID) | ORAL | Status: DC | PRN
  Administered 2015-09-01: 0.5 mg via ORAL
  Filled 2015-08-31: qty 1

## 2015-08-31 MED ORDER — AMOXICILLIN-POT CLAVULANATE 875-125 MG PO TABS: 1.0000 | ORAL_TABLET | Freq: Two times a day (BID) | ORAL | Status: DC

## 2015-08-31 MED ORDER — METOCLOPRAMIDE HCL 5 MG/ML IJ SOLN
10.0000 mg | Freq: Two times a day (BID) | INTRAMUSCULAR | Status: DC
Start: 2015-08-31 — End: 2015-09-02
  Administered 2015-08-31 – 2015-09-01 (×3): 10 mg via INTRAVENOUS
  Filled 2015-08-31 (×6): qty 2

## 2015-08-31 MED ORDER — BISACODYL 10 MG RE SUPP: 10.0000 mg | Freq: Every day | RECTAL | Status: DC | PRN

## 2015-08-31 MED ORDER — CETYLPYRIDINIUM CHLORIDE 0.05 % MT LIQD
7.0000 mL | Freq: Two times a day (BID) | OROMUCOSAL | Status: DC
  Administered 2015-08-31 – 2015-09-01 (×4): 7 mL via OROMUCOSAL

## 2015-08-31 MED ORDER — CLONIDINE HCL 0.1 MG PO TABS: 0.2000 mg | ORAL_TABLET | Freq: Two times a day (BID) | ORAL | Status: DC

## 2015-08-31 MED ORDER — SUCRALFATE 1 GM/10ML PO SUSP
1.0000 g | Freq: Three times a day (TID) | ORAL | Status: DC
  Administered 2015-08-31: 1 g via ORAL
  Filled 2015-08-31 (×13): qty 10

## 2015-08-31 MED ORDER — TAMSULOSIN HCL 0.4 MG PO CAPS
0.4000 mg | ORAL_CAPSULE | Freq: Every day | ORAL | Status: DC
  Filled 2015-08-31 (×3): qty 1

## 2015-08-31 MED ORDER — BACLOFEN 10 MG PO TABS
10.0000 mg | ORAL_TABLET | Freq: Three times a day (TID) | ORAL | Status: DC
  Administered 2015-08-31: 10 mg via ORAL
  Filled 2015-08-31 (×9): qty 1

## 2015-08-31 MED ORDER — LACOSAMIDE 50 MG PO TABS
150.0000 mg | ORAL_TABLET | Freq: Two times a day (BID) | ORAL | Status: DC
  Administered 2015-08-31 (×2): 150 mg via ORAL
  Filled 2015-08-31 (×2): qty 3

## 2015-08-31 MED ORDER — ALBUTEROL SULFATE (2.5 MG/3ML) 0.083% IN NEBU
2.5000 mg | INHALATION_SOLUTION | Freq: Four times a day (QID) | RESPIRATORY_TRACT | Status: DC | PRN
  Administered 2015-08-31: 2.5 mg via RESPIRATORY_TRACT
  Filled 2015-08-31: qty 3

## 2015-08-31 MED ORDER — MORPHINE SULFATE 10 MG/5ML PO SOLN
5.0000 mg | ORAL | Status: DC | PRN
  Administered 2015-08-31 – 2015-09-01 (×5): 5 mg via ORAL
  Filled 2015-08-31 (×5): qty 5

## 2015-08-31 MED ORDER — QUETIAPINE FUMARATE 50 MG PO TABS
25.0000 mg | ORAL_TABLET | Freq: Every day | ORAL | Status: DC
  Administered 2015-08-31: 25 mg via ORAL
  Filled 2015-08-31 (×4): qty 1

## 2015-08-31 MED ORDER — DUTASTERIDE 0.5 MG PO CAPS: 0.5000 mg | ORAL_CAPSULE | Freq: Every day | ORAL | Status: DC

## 2015-08-31 MED ORDER — PAROXETINE HCL 20 MG PO TABS
20.0000 mg | ORAL_TABLET | Freq: Every day | ORAL | Status: DC
  Administered 2015-08-31: 20 mg via ORAL
  Filled 2015-08-31 (×3): qty 1

## 2015-08-31 MED ORDER — METOCLOPRAMIDE HCL 10 MG PO TABS
10.0000 mg | ORAL_TABLET | Freq: Two times a day (BID) | ORAL | Status: DC
  Administered 2015-08-31 (×2): 10 mg via ORAL
  Filled 2015-08-31 (×3): qty 1

## 2015-08-31 MED ORDER — METOPROLOL TARTRATE 50 MG PO TABS
50.0000 mg | ORAL_TABLET | Freq: Two times a day (BID) | ORAL | Status: DC
  Administered 2015-08-31 (×2): 50 mg via ORAL
  Filled 2015-08-31 (×3): qty 1

## 2015-08-31 NOTE — Progress Notes (Signed)
Patient seen and evaluated earlier this AM by my associate. Please refer to H and P for details regarding assessment and plan.  Patient has been having emesis. Will place NPO and on MIVF's. Discussed with nursing. Will deep suction if patient is having difficulty breathing for comfort care measures.  Will reassess next am and plan on transitioning back to hospice care.   Gen: Pt in nad, alert and awake CV: no cyanosis Pulm: course breath sounds most likely transmitted from upper respiratory system.  Vangie Henthorn, [employer]

## 2015-08-31 NOTE — Progress Notes (Signed)
Patient noted to have increased work of breathing.  Respirations labored, patient agitated.  Patient vomiting foamy brown substance.  Zofran administered.  Patient appears to be choking on secretions, not relieved by Yankauer.    Respiratory called to deep suction patient.  Patient responded well, breathing less labored and patient appears calm.  Will continue to monitor.

## 2015-08-31 NOTE — H&P (Signed)
 PCP:  SOUTH,STEPHEN ALAN, MD    Referring provider  Little   Chief Complaint: Cough difficulty breathing  HPI: Colin Bradley is a 64 y.o. male   has a past medical history of Seizures (HCC); Cerebral palsy (HCC); Hypertension; Mental retardation; Depression; Nephrolithiasis; BPH (benign prostatic hyperplasia); GI bleed (2009); Esophageal stricture (2009); and Depression with anxiety.   Presented with    Patient has known history of mental retardation with a history of erosive esophagitis resulting in GI bleed this for which she has been admitted in September requiring ICU stay and intubation secondary to inability to protect airway complicated by aspiration pneumonia. His hospital stay was complicated by another cardiopulmonary arrest resulting in CPR. Palliative care consult was called his CODE STATUS was changed to DO NOT RESUSCITATE since then he was started on dysphagia 1 diet and discharged to hospice. The plan was to avoid feeding tube and continue with comfort feeding. The plan was to avoid rehospitalization if possible.   Patient today was at [facility] was drinking honey thick liquids and became choked recurrently coughing. EMS was called and he was brought in to emergency department prior to contacting hospice nurse. Patient initially was hypoxic down to 88% but improved with oxygen chest x-ray was done suspicious for persistent aspiration. Received IV clindamycin. Hospitalist was called for admission for acute respiratory failure it was found that patient has MOST form signed MOST Form:  DNR, comfort care  No feeding tube  Comfort Feeding  Stop IV antibiotics, complete course of oral abx if tolerated, do not treat another PNA and allow for a natural death to occur with comfort and dignity-this is an irreversible condition. A feeding tube will not prevent aspiration. Avoid Rehospitalization  Hospice director was contacted and stated this point there's no  oxygen available at the nursing facility and has to be ordered. Patient also has no pain management or air hunger management as needed ordered. As well as then no orders for avoiding  Rehospitalization on file.  Review of Systems:  Unable to provide any review of records  Past Medical History: Past Medical History  Diagnosis Date  . Seizures (HCC)   . Cerebral palsy (HCC)   . Hypertension   . Mental retardation   . Depression   . Nephrolithiasis   . BPH (benign prostatic hyperplasia)   . GI bleed 2009    coffee ground emesis, erosive esophagitis on EGD by Dr Brodie  . Esophageal stricture 2009    not dilated during the EGD, biopsy benign:  no Barretts  . Depression with anxiety    Past Surgical History  Procedure Laterality Date  . Basal cell carcinoma excision  2002    forehead  . Squamous cell carcinoma excision  2004    lip  . Bowen dz  2002    right cheek   . Esophagogastroduodenoscopy  06/14/2012    Procedure: ESOPHAGOGASTRODUODENOSCOPY (EGD);  Surgeon: Daniel P Jacobs, MD;  Location: [facility] ENDOSCOPY;  Service: Endoscopy;  Laterality: N/A;  . Multiple extractions with alveoloplasty N/A 06/20/2014    Procedure: MULTIPLE EXTRACTIONS TEETH NUMBER TWO, TEN, TWELVE, EIGHTEEN, TWENTY-ONE, TWENTY-TWO, TWENTY-NINE, THIRTY-TWO;  Surgeon: Scott M Jensen, DDS;  Location: [facility] OR;  Service: Oral Surgery;  Laterality: N/A;  . Esophagogastroduodenoscopy N/A 06/20/2015    Procedure: ESOPHAGOGASTRODUODENOSCOPY (EGD);  Surgeon: Steven Paul Armbruster, MD;  Location: [facility] ENDOSCOPY;  Service: Gastroenterology;  Laterality: N/A;     Medications: Prior to Admission medications   Medication Sig Start Date   End Date Taking? Authorizing Provider  baclofen (LIORESAL) 10 MG tablet Take 10 mg by mouth 3 (three) times daily.    Yes Historical Provider, MD  cloNIDine (CATAPRES) 0.2 MG tablet Take 1 tablet (0.2 mg total) by mouth 2 (two) times daily. 11/07/14  Yes Nayana Abrol, MD  cycloSPORINE (RESTASIS) 0.05 %  ophthalmic emulsion Place 1 drop into both eyes 2 (two) times daily.   Yes Historical Provider, MD  dutasteride (AVODART) 0.5 MG capsule Take 0.5 mg by mouth daily.   Yes Historical Provider, MD  Lacosamide (VIMPAT) 150 MG TABS Take 150 mg by mouth 2 (two) times daily.   Yes Historical Provider, MD  Maltodextrin-Xanthan Gum (RESOURCE THICKENUP CLEAR) POWD 2 times a day with meals Patient taking differently: Take 1 g by mouth 2 (two) times daily. with meals 11/07/14  Yes Nayana Abrol, MD  metoCLOPramide (REGLAN) 10 MG tablet Take 10 mg by mouth 2 (two) times daily.   Yes Historical Provider, MD  metoprolol (LOPRESSOR) 50 MG tablet Take 1 tablet (50 mg total) by mouth 2 (two) times daily. 12/07/14  Yes Ripudeep K Rai, MD  Multiple Vitamin (DAILY VITE PO) Take 1 tablet by mouth daily with breakfast.    Yes Historical Provider, MD  PARoxetine (PAXIL) 20 MG tablet Take 20 mg by mouth daily.   Yes Historical Provider, MD  potassium chloride 20 MEQ TBCR Take 20 mEq by mouth daily. 11/07/14  Yes Nayana Abrol, MD  QUEtiapine (SEROQUEL) 25 MG tablet Take 25 mg by mouth at bedtime.   Yes Historical Provider, MD  ranitidine (ZANTAC) 150 MG tablet Take 150 mg by mouth 2 (two) times daily.   Yes Historical Provider, MD  sucralfate (CARAFATE) 1 GM/10ML suspension Take 1 g by mouth 4 (four) times daily -  with meals and at bedtime.   Yes Historical Provider, MD  tamsulosin (FLOMAX) 0.4 MG CAPS capsule Take 0.4 mg by mouth daily.   Yes Historical Provider, MD  albuterol (PROVENTIL) (2.5 MG/3ML) 0.083% nebulizer solution Take 3 mLs (2.5 mg total) by nebulization every 6 (six) hours as needed for wheezing or shortness of breath. 11/07/14   Nayana Abrol, MD  amoxicillin-clavulanate (AUGMENTIN) 875-125 MG tablet Take 1 tablet by mouth 2 (two) times daily. X 5 days Patient not taking: Reported on 08/29/2015 07/12/15   Preetha Joseph, MD  bisacodyl (DULCOLAX) 10 MG suppository Place 1 suppository (10 mg total) rectally daily as  needed for moderate constipation. 07/12/15   Preetha Joseph, MD  LORazepam (ATIVAN) 0.5 MG tablet Take 1 tablet (0.5 mg total) by mouth every 8 (eight) hours as needed for anxiety. 07/12/15   Preetha Joseph, MD  ondansetron (ZOFRAN) 4 MG tablet Take 4 mg by mouth 3 (three) times daily as needed for nausea or vomiting.    Historical Provider, MD  promethazine (PHENERGAN) 12.5 MG tablet Take 12.5 mg by mouth every 12 (twelve) hours as needed for nausea or vomiting.    Historical Provider, MD    Allergies:  No Known Allergies  Social History:     From facility [facility]   reports that he has never smoked. He has never used smokeless tobacco. He reports that he does not drink alcohol or use illicit drugs.    Family History: Family history is unknown by patient.    Physical Exam: Patient Vitals for the past 24 hrs:  BP Temp Temp src Pulse Resp SpO2  08/13/2015 1903 - - - - - 92 %  09/05/2015 1844 175/93   mmHg - - 81 - (!) 88 %  09/05/2015 1838 (!) 184/104 mmHg 97.3 F (36.3 C) Oral 82 20 (!) 83 %    1. General:  in No Acute distress 2. Psychological: Alert but not Oriented 3. Head/ENT:     Dry Mucous Membranes                          Head Non traumatic, neck supple                           Poor Dentition 4. SKIN:  decreased Skin turgor,  Skin clean Dry and intact no rash 5. Heart: Regular rate and rhythm no Murmur, Rub or gallop 6. Lungs: Clear to auscultation bilaterally, no wheezes or crackles   7. Abdomen: Soft, non-tender, Non distended 8. Lower extremities: no clubbing, cyanosis, or edema 9. Neurologically Grossly intact, moving all 4 extremities equally 10. MSK: Normal range of motion  body mass index is unknown because there is no weight on file.   Labs on Admission:   Results for orders placed or performed during the hospital encounter of 08/18/2015 (from the past 24 hour(s))  CBC with Differential     Status: Abnormal   Collection Time: 09/03/2015  8:57  PM  Result Value Ref Range   WBC 12.2 (H) 4.0 - 10.5 K/uL   RBC 4.00 (L) 4.22 - 5.81 MIL/uL   Hemoglobin 12.1 (L) 13.0 - 17.0 g/dL   HCT 37.0 (L) 39.0 - 52.0 %   MCV 92.5 78.0 - 100.0 fL   MCH 30.3 26.0 - 34.0 pg   MCHC 32.7 30.0 - 36.0 g/dL   RDW 14.6 11.5 - 15.5 %   Platelets 210 150 - 400 K/uL   Neutrophils Relative % 79 %   Neutro Abs 9.6 (H) 1.7 - 7.7 K/uL   Lymphocytes Relative 12 %   Lymphs Abs 1.4 0.7 - 4.0 K/uL   Monocytes Relative 7 %   Monocytes Absolute 0.8 0.1 - 1.0 K/uL   Eosinophils Relative 2 %   Eosinophils Absolute 0.3 0.0 - 0.7 K/uL   Basophils Relative 0 %   Basophils Absolute 0.0 0.0 - 0.1 K/uL  Basic metabolic panel     Status: Abnormal   Collection Time: 08/25/2015  8:57 PM  Result Value Ref Range   Sodium 137 135 - 145 mmol/L   Potassium 5.0 3.5 - 5.1 mmol/L   Chloride 103 101 - 111 mmol/L   CO2 23 22 - 32 mmol/L   Glucose, Bld 111 (H) 65 - 99 mg/dL   BUN 26 (H) 6 - 20 mg/dL   Creatinine, Ser 1.19 0.61 - 1.24 mg/dL   Calcium 9.5 8.9 - 10.3 mg/dL   GFR calc non Af Amer >60 >60 mL/min   GFR calc Af Amer >60 >60 mL/min   Anion gap 11 5 - 15    UA not obtained  No results found for: HGBA1C  CrCl cannot be calculated (Unknown ideal weight.).  BNP (last 3 results) No results for input(s): PROBNP in the last 8760 hours.  Other results:  I have pearsonaly reviewed this: ECG REPORT not obtained   There were no vitals filed for this visit.   Cultures:    Component Value Date/Time   SDES TRACHEAL ASPIRATE 06/21/2015 0850   SPECREQUEST Normal 06/21/2015 0850   CULT  06/21/2015 0850    MODERATE KLEBSIELLA PNEUMONIAE 16 Note: Confirmed   Extended Spectrum Beta-Lactamase Producer (ESBL) CRITICAL RESULT CALLED TO, READ BACK BY AND VERIFIED WITH: DENISE ALDRIDGE 9 17 @ 0955 BY PARDA Performed at [facility]    REPTSTATUS 06/24/2015 FINAL 06/21/2015 0850     Radiological Exams on Admission: Dg Chest 2 View  08/21/2015  CLINICAL DATA:   Cough after swallowing thick liquid. Cervical palsy. EXAM: CHEST  2 VIEW COMPARISON:  July 09, 2015 FINDINGS: There is focal infiltrate in the right base. The lungs elsewhere clear. Heart size and pulmonary vascularity are normal. No adenopathy. No bone lesions. IMPRESSION: Focal infiltrate right base. Given the clinical history, aspiration is a differential consideration. Infectious pneumonia is also a differential consideration. Lungs elsewhere clear. Electronically Signed   By: William  Woodruff III M.D.   On: 08/09/2015 19:36    Chart has been reviewed  Family not at  Bedside    Assessment/Plan  64-year-old male with history of mental retardation and chronic aspiration currently on hospice comfort care presents with another episode of aspiration and acute respiratory failure with hypoxia with chest x-ray worrisome for recurrent aspiration pneumonia as per records comfort care only  Present on Admission:  . Respiratory failure (HCC) - Currently comfortable . Aspiration pneumonia (HCC)- Augmentin PO . Acute respiratory failure with hypoxia (HCC) -spoke with Hospice Nurse on call   and  Facility director Emily on discharge will need to make sure patient has PRN oxygen available and Morphine for air hunger. Emily states she will order. Will call tomorrow to confirm when is going to be available.  Spoke with DSS [phone] Carmen Charlton [phone] and  Sherill Moore Program Manager [phone] who confirms that patient is completely calm forte care has been discussed in the past with palliative care consult MOST form has been signed and as follows:  MOST Form:  DNR, comfort care  No feeding tube  Comfort Feeding  Stop IV antibiotics, complete course of oral abx if tolerated, do not treat another PNA and allow for a natural death to occur with comfort and dignity-this is an irreversible condition. A feeding tube will not prevent aspiration. Avoid Rehospitalization   CODE  STATUS:   DNR/DNI as per records  Disposition:                           Back to current facility when oxygen available                             Other plan as per orders.  I have spent a total of 65 min on this admission  Marleigh Kaylor 08/11/2015, 10:30 PM  [facility]  Pager [phone]   after 2 AM please page floor coverage PA If 7AM-7PM, please contact the day team taking care of the patient  [email or web address]  Password [email or web address]   

## 2015-09-01 LAB — LEGIONELLA PNEUMOPHILA SEROGP 1 UR AG: L. pneumophila Serogp 1 Ur Ag: NEGATIVE

## 2015-09-01 MED ORDER — ALBUTEROL SULFATE (2.5 MG/3ML) 0.083% IN NEBU
INHALATION_SOLUTION | RESPIRATORY_TRACT | Status: AC
  Administered 2015-09-01: 2.5 mg
  Filled 2015-09-01: qty 3

## 2015-09-01 MED ORDER — MORPHINE SULFATE (PF) 2 MG/ML IV SOLN
1.0000 mg | INTRAVENOUS | Status: DC | PRN
  Administered 2015-09-01 – 2015-09-02 (×2): 1 mg via INTRAVENOUS
  Filled 2015-09-01 (×2): qty 1

## 2015-09-01 MED ORDER — ALBUTEROL SULFATE (2.5 MG/3ML) 0.083% IN NEBU
2.5000 mg | INHALATION_SOLUTION | RESPIRATORY_TRACT | Status: DC | PRN
  Administered 2015-09-01 (×2): 2.5 mg via RESPIRATORY_TRACT
  Filled 2015-09-01 (×2): qty 3

## 2015-09-01 NOTE — Progress Notes (Signed)
RT completed NTS times 2 via right nare- resulted in copious, thick , yellow mucus. RT has asked RN about a patch to dry secretions.

## 2015-09-01 NOTE — Progress Notes (Signed)
Nurse reports concerns about administering oral pain regimen due to difficulty breathing. Will place on morphine IV for pain.  Colin Bradley, [employer]

## 2015-09-01 NOTE — Progress Notes (Signed)
[facility] PROGRESS NOTE  Zebulun W Whelchel ZOX:[id number] Balloon:[id number] DOB: 29-May-1951 DOA: 06-12-2015 PCP: Julian HySOUTH,STEPHEN ALAN, MD  Assessment/Plan: Active Problems:    Acute respiratory failure with hypoxia (HCC) - Most likely from aspiration of emesis - continue supportive therapy    Respiratory failure (HCC) - supplemental oxygen as needed    Aspiration pneumonia (HCC) - Reviewed MOST form - aspiration most likely from nausea and emesis - continue supportive therapy. - Will make NPO until emesis subsides   Mental retardation   Seizure disorder (HCC) - stable  Code Status: DNR Family Communication: None at bedside  Disposition Plan:    Consultants:  None  Procedures:  None  Antibiotics:  None  HPI/Subjective: Pt has limited response to examiner. Nods yes and no to questions.  Objective: Filed Vitals:   09/01/15 0452 09/01/15 1023  BP: 154/75 163/81  Pulse: 114 115  Temp: 100.3 F (37.9 C) 99 F (37.2 C)  Resp: 26 20    Intake/Output Summary (Last 24 hours) at 09/01/15 1346 Last data filed at 09/01/15 0511  Gross per 24 hour  Intake 1036.25 ml  Output    545 ml  Net 491.25 ml   There were no vitals filed for this visit.  Exam:   General:  Pt in nad, alert and awake  Cardiovascular: s1 and s2 wnl, no rubs  Respiratory: course breath sounds BL mostly transmitted from upper airway  Abdomen: soft, ND  Musculoskeletal: no cyanosis on limited exam.   Data Reviewed: Basic Metabolic Panel:  Recent Labs Lab 06-26-15 2057  NA 137  K 5.0  CL 103  CO2 23  GLUCOSE 111*  BUN 26*  CREATININE 1.19  CALCIUM 9.5   Liver Function Tests: No results for input(s): AST, ALT, ALKPHOS, BILITOT, PROT, ALBUMIN in the last 168 hours. No results for input(s): LIPASE, AMYLASE in the last 168 hours. No results for input(s): AMMONIA in the last 168 hours. CBC:  Recent Labs Lab 06-26-15 2057  WBC 12.2*  NEUTROABS 9.6*  HGB 12.1*  HCT 37.0*  MCV 92.5   PLT 210   Cardiac Enzymes: No results for input(s): CKTOTAL, CKMB, CKMBINDEX, TROPONINI in the last 168 hours. BNP (last 3 results)  Recent Labs  12/03/14 1650  BNP 133.7*    ProBNP (last 3 results) No results for input(s): PROBNP in the last 8760 hours.  CBG: No results for input(s): GLUCAP in the last 168 hours.  No results found for this or any previous visit (from the past 240 hour(s)).   Studies: Dg Chest 2 View  06-12-2015  CLINICAL DATA:  Cough after swallowing thick liquid. Cervical palsy. EXAM: CHEST  2 VIEW COMPARISON:  July 09, 2015 FINDINGS: There is focal infiltrate in the right base. The lungs elsewhere clear. Heart size and pulmonary vascularity are normal. No adenopathy. No bone lesions. IMPRESSION: Focal infiltrate right base. Given the clinical history, aspiration is a differential consideration. Infectious pneumonia is also a differential consideration. Lungs elsewhere clear. Electronically Signed   By: Bretta BangWilliam  Woodruff III M.D.   On: 06-12-2015 19:36    Scheduled Meds: . antiseptic oral rinse  7 mL Mouth Rinse BID  . baclofen  10 mg Oral TID  . cycloSPORINE  1 drop Both Eyes BID  . famotidine  10 mg Oral Daily  . lacosamide  150 mg Oral BID  . metoCLOPramide (REGLAN) injection  10 mg Intravenous Q12H  . metoprolol  5 mg Intravenous 4 times per day  . PARoxetine  20 mg  Oral Daily  . QUEtiapine  25 mg Oral QHS  . scopolamine  1 patch Transdermal Q72H  . sucralfate  1 g Oral TID WC & HS  . tamsulosin  0.4 mg Oral Daily   Continuous Infusions: . dextrose 5 % and 0.45% NaCl 75 mL/hr at 09/01/15 0732    Time spent: > 20 minutes    Penny Pia  [facility] Pager [phone]. If 7PM-7AM, please contact night-coverage at [email or web address], password [facility] 09/01/2015, 1:46 PM  LOS: 1 day

## 2015-09-01 NOTE — Progress Notes (Signed)
RT completed NTS times 1 via right nare- resulted in copious, yellow, thick mucus.

## 2015-09-01 NOTE — Progress Notes (Signed)
NTS, copious amounts of dark brown tan secretions. Pt has increase WOB, respirations in the 40's. PRN tx administered as well.

## 2015-09-01 NOTE — Progress Notes (Signed)
[facility], [facility] Oxygen and suction will be available to patient upon return to the facility. If plans for d/c over weekend, should contact [facility] at [phone] and ask for Marchelle Folksmanda, if not available then on-call person would be next contact. [facility] fax is [phone]. They will f/u on Monday otherwise.

## 2015-09-01 NOTE — Progress Notes (Signed)
RT assessed PT for NTS (RN at bedside also). BS are diminished with slight crackle in RUL at this time. Sp02 94% on 100%, HR 111, rr 22. PT does not appear to be in distress at this time- RT opted not to NTS at this time- RN aware. RN encouraged to contact RT if needed.

## 2015-09-02 MED ORDER — LORAZEPAM 2 MG/ML IJ SOLN: 0.5000 mg | INTRAMUSCULAR | Status: DC | PRN

## 2015-09-02 MED ORDER — LORAZEPAM 2 MG/ML IJ SOLN
0.5000 mg | Freq: Three times a day (TID) | INTRAMUSCULAR | Status: DC | PRN
  Administered 2015-09-02: 0.5 mg via INTRAVENOUS
  Filled 2015-09-02: qty 1

## 2015-09-07 NOTE — Discharge Summary (Signed)
Death Summary  Sharion BalloonJerry W Guggenheim QMV:[id number] DOB: 1950/12/13 DOA: 30-Apr-2015  PCP: Julian HySOUTH,STEPHEN ALAN, MD  Admit date: 30-Apr-2015 Date of Death: 09/01/2015  Final Diagnoses:  Active Problems:   Mental retardation   Seizure disorder (HCC)   Acute respiratory failure with hypoxia (HCC)   Respiratory failure (HCC)   Aspiration pneumonia (HCC)    History of present illness:  From original HPI 64 y.o. male  has a past medical history of Seizures (HCC); Cerebral palsy (HCC); Hypertension; Mental retardation; Depression; Nephrolithiasis; BPH (benign prostatic hyperplasia); GI bleed (2009); Esophageal stricture (2009); and Depression with anxiety.   Presented with   Patient has known history of mental retardation with a history of erosive esophagitis resulting in GI bleed this for which she has been admitted in September requiring ICU stay and intubation secondary to inability to protect airway complicated by aspiration pneumonia. His hospital stay was complicated by another cardiopulmonary arrest resulting in CPR. Palliative care consult was called his CODE STATUS was changed to DO NOT RESUSCITATE since then he was started on dysphagia 1 diet and discharged to hospice. The plan was to avoid feeding tube and continue with comfort feeding. The plan was to avoid rehospitalization if possible.   Hospital Course:  Patient continued to have supportive care which included supplemental oxygen, deep suctioning, antiemetics. He continued to have problems and we suspect patient was having aspiration leading to acute respiratory failure.  Patient passed away this am ~ 6 AM   Time: 820 AM  Signed:  Penny PiaVEGA, Lynelle Weiler  [facility] 08/22/2015, 8:19 AM

## 2015-09-07 NOTE — Progress Notes (Signed)
Pt comfort care status according to H&P note from MD Doutouva on 08/31/15.  RN on 5th floor notified and asked to keep patient until ICU RN could clarify plan of care with [facility] NP. 5th floor RN did not wait to transfer patient as requested so plan of care clarification (bipap is not needed or indicated per MOST form wishes detailed in MD Doutouva's progress note) did not occur until after pt was present on ICU/Sd.  [facility] provider gave order to discontinue bipap so comfort measures initiated.  Decision made to keep pt in ICU/SD rather than transferring back to floor.    2 RNs verified absence of heart and lung sounds for one minute.  Pt passed away at 0607 with RN at bedside.  Department of Social Services was contacted and notified of pt death-message was left in office of Lissa MerlinCheri Stinson and [phone] number was called and RN was told the answering service would relay message to DSS.  No return call from C. Stinson or answering service.  [facility] NP and CDS notified of death.  No family present.

## 2015-09-07 NOTE — Progress Notes (Signed)
   2015-10-04 0200  Complaints & Interventions  Complains of Anxiety (Observed. Message sent to the ON-Call for IV ativan)

## 2015-09-07 NOTE — Progress Notes (Signed)
T.Callahan, PA notified of patient's respiratory failure status. Guardian was called by Benedetto Coons. Callahan, PA without any response. Orders were placed to transfer patient to Stepdown unit and BIPAP continuous.

## 2015-09-07 NOTE — Progress Notes (Signed)
   09/01/2015 0130  Neurological  Neuro (WDL) X  Respiratory  Respiratory (WDL) X (RT notified for RX and deep suction)  Respiratory Interventions Oral suction  Respiratory Pattern Labored;Accessory muscle use;Dyspnea at rest;Shallow  Oral Suctioning/Secretions  Suction Type Oral  Suction Device Yankauer  Psychosocial  Psychosocial (WDL) X  Patient Behaviors Anxious

## 2015-09-07 NOTE — Progress Notes (Signed)
   08/28/2015 0130  Vitals  BP (!) 91/55 mmHg  BP Location Right Arm  BP Method Automatic  Patient Position (if appropriate) Lying  Pulse Rate (!) 131  Pulse Rate Source Dinamap  Resp (!) 40  Oxygen Therapy  SpO2 (!) 84 %  O2 Device Non-rebreather Mask  O2 Flow Rate (L/min) 15 L/min  PCA/Epidural/Spinal Assessment  Respiratory Pattern Labored;Accessory muscle use;Dyspnea at rest;Shallow

## 2015-09-07 NOTE — Progress Notes (Signed)
On-Call T. Callhaan, PA notified of patient not responding to IV morphine and IV ativan. RT notified for neb treatment per PA recommendation.

## 2015-09-07 NOTE — Progress Notes (Signed)
Pt's father notified of pt death.

## 2015-09-07 DEATH — deceased

## 2016-11-21 IMAGING — CR DG ABDOMEN 1V
1 series · 1 of 1 positions shown · non-contrast
Comparison: Radiograph dated 06/20/2015

CLINICAL DATA: 63-year-old male with enteric tube placement.

EXAM:
ABDOMEN - 1 VIEW

[ap (kub)]
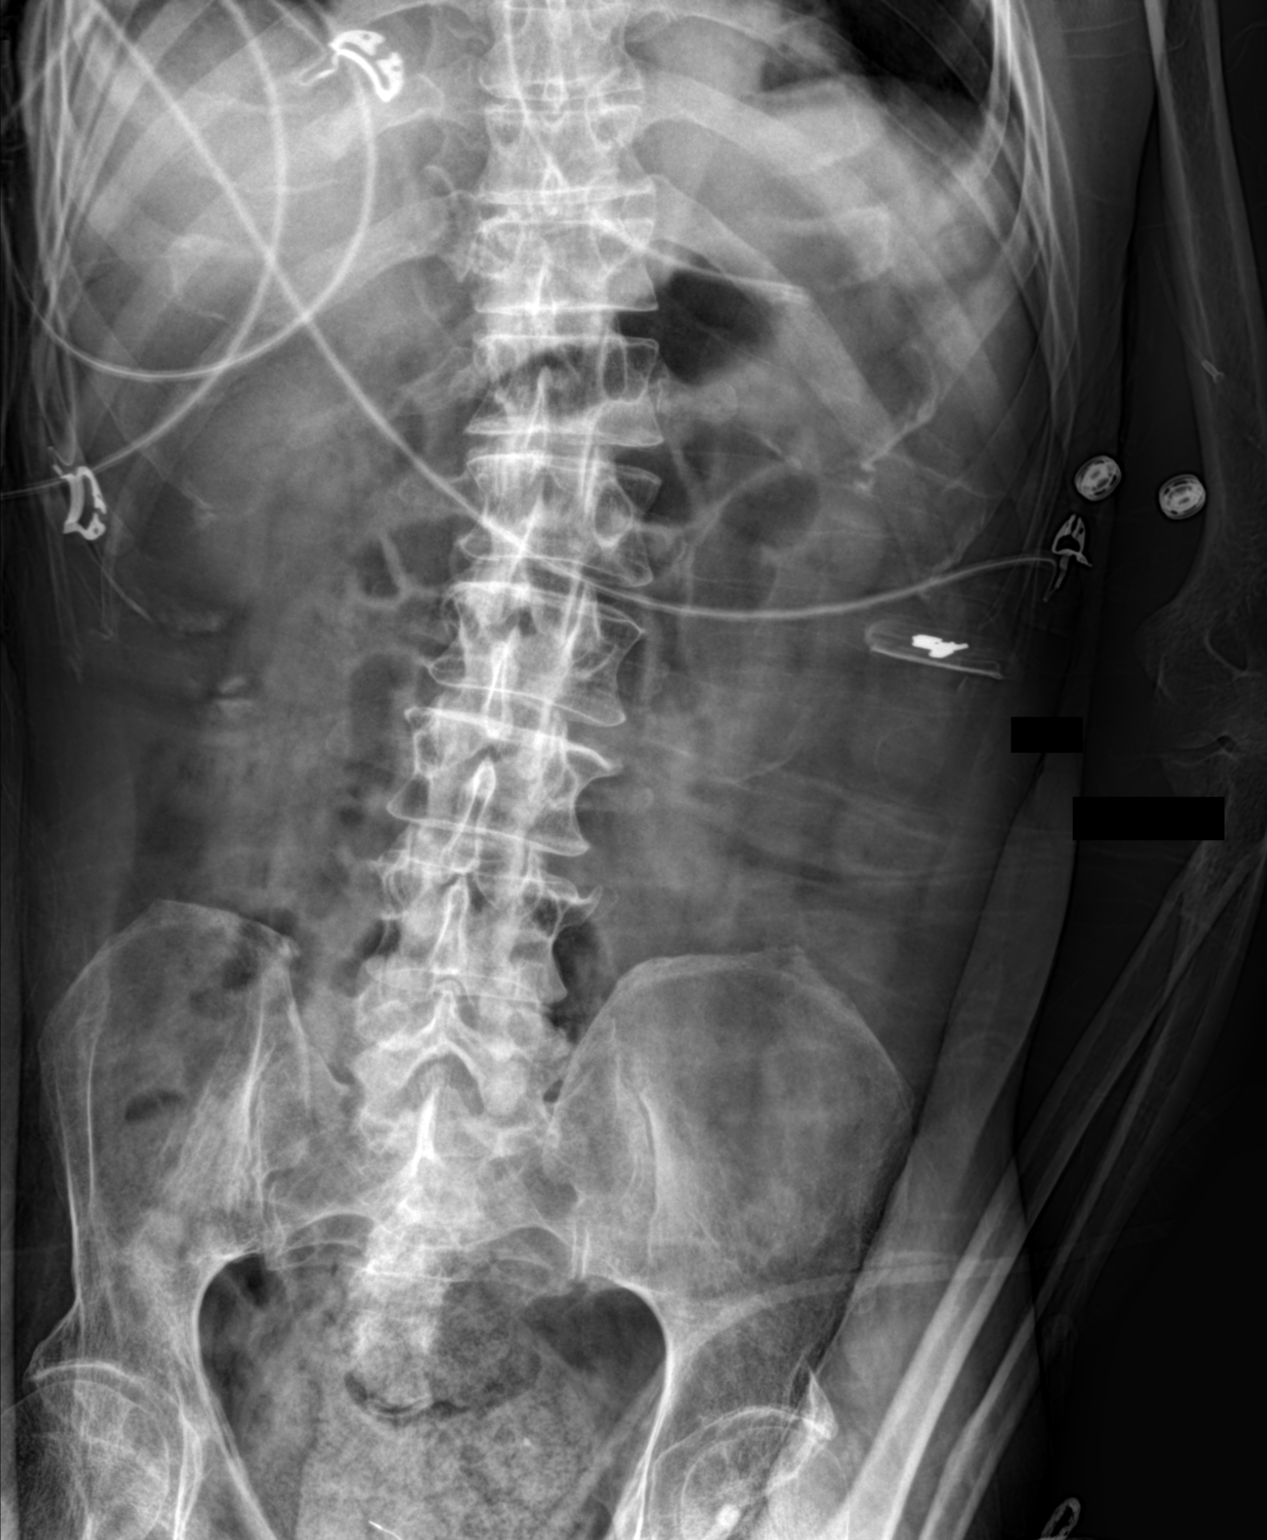

[1 of 1 positions shown; findings below may reference images not displayed]

FINDINGS: An enteric tube is partially visualized extending to the left upper
abdomen with tip over the gastric bubble likely in the body of the
stomach. There is no bowel dilatation or evidence of obstruction. No
radiopaque calculi identified. There is degenerative changes of the
spine. No acute fracture.
IMPRESSION: Enteric tube with tip over the gastric bubble likely within the
stomach.

## 2016-11-21 IMAGING — DX DG ABD PORTABLE 1V
1 series · 1 of 1 positions shown · non-contrast
Comparison: 06/20/2015 radiographs.

CLINICAL DATA: Chest pain. Seizures with vomiting and respiratory
distress. Nasogastric tube placement. Initial encounter.

EXAM:
PORTABLE ABDOMEN - 1 VIEW

[abdomen kub]
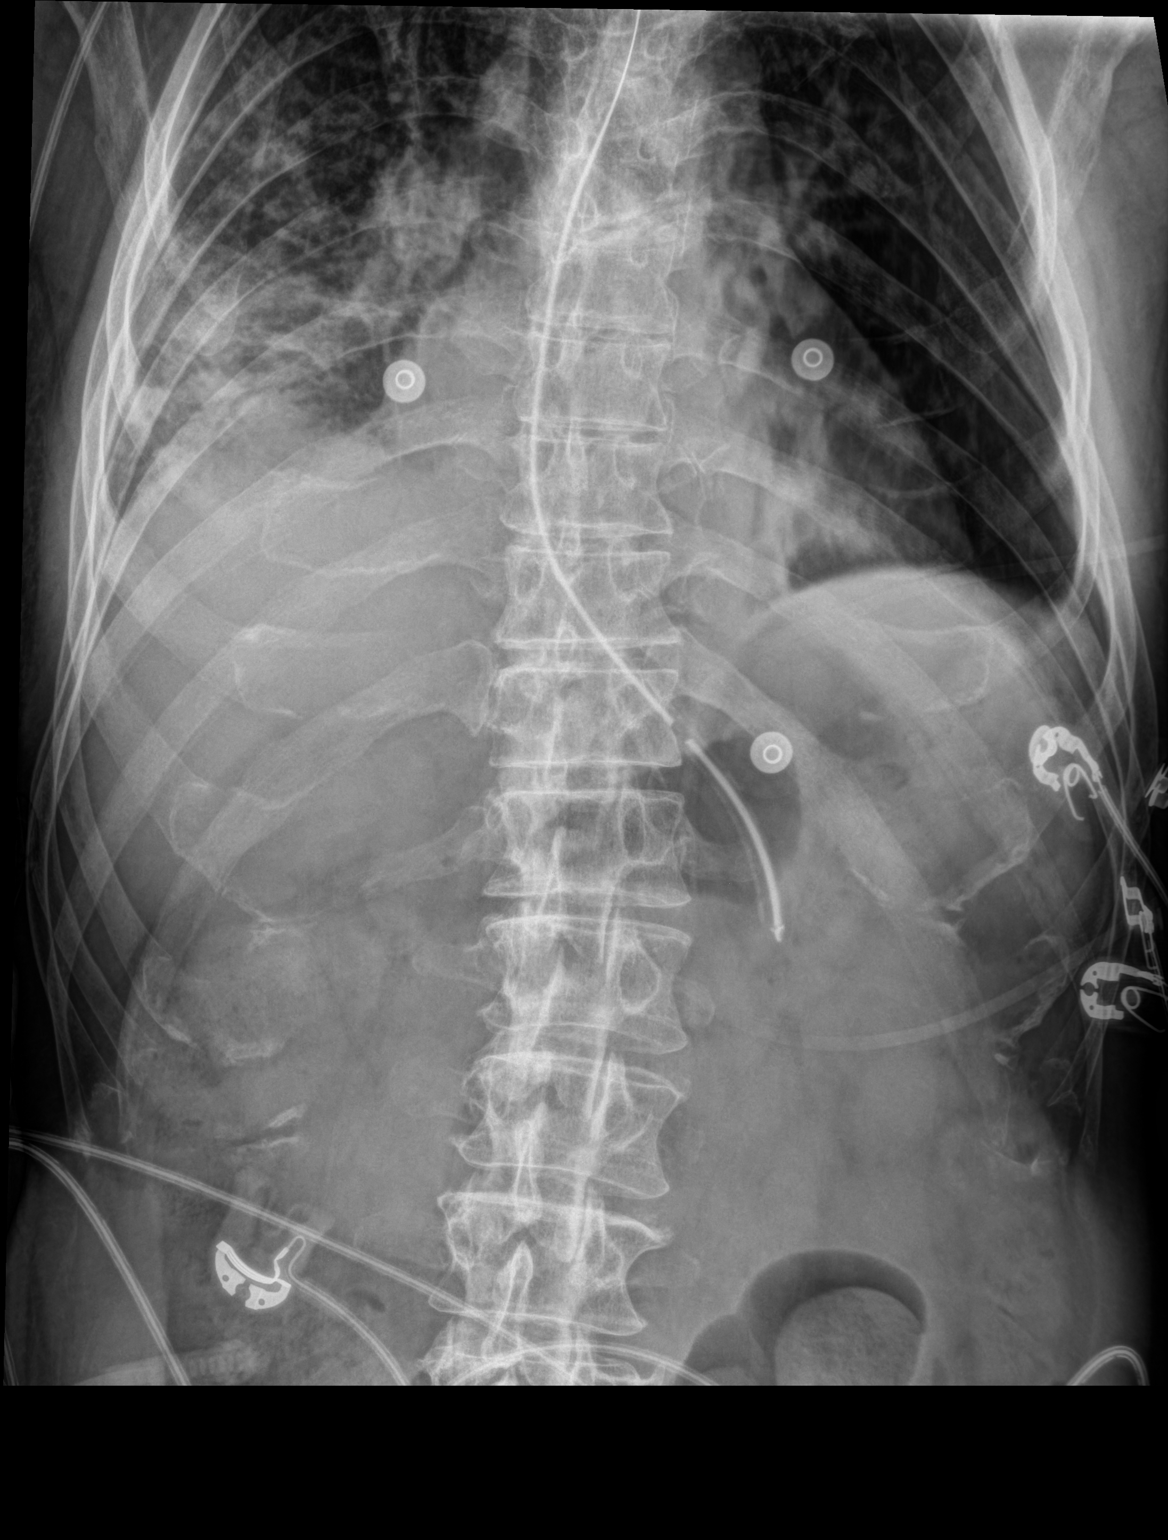

[1 of 1 positions shown; findings below may reference images not displayed]

FINDINGS: 7557 hours. Nasogastric tube tip overlies the mid stomach. The
visualized bowel gas pattern is nonobstructive. There is persistent
right basilar airspace disease suspicious for aspiration.
IMPRESSION: Nasogastric tube tip overlies the mid stomach. Persistent right
basilar airspace disease.

## 2016-11-21 IMAGING — DX DG ABD PORTABLE 1V
1 series · 1 of 1 positions shown · non-contrast
Comparison: 06/20/2015

CLINICAL DATA: Orogastric tube placement.

EXAM:
PORTABLE ABDOMEN - 1 VIEW

[abdomen kub]
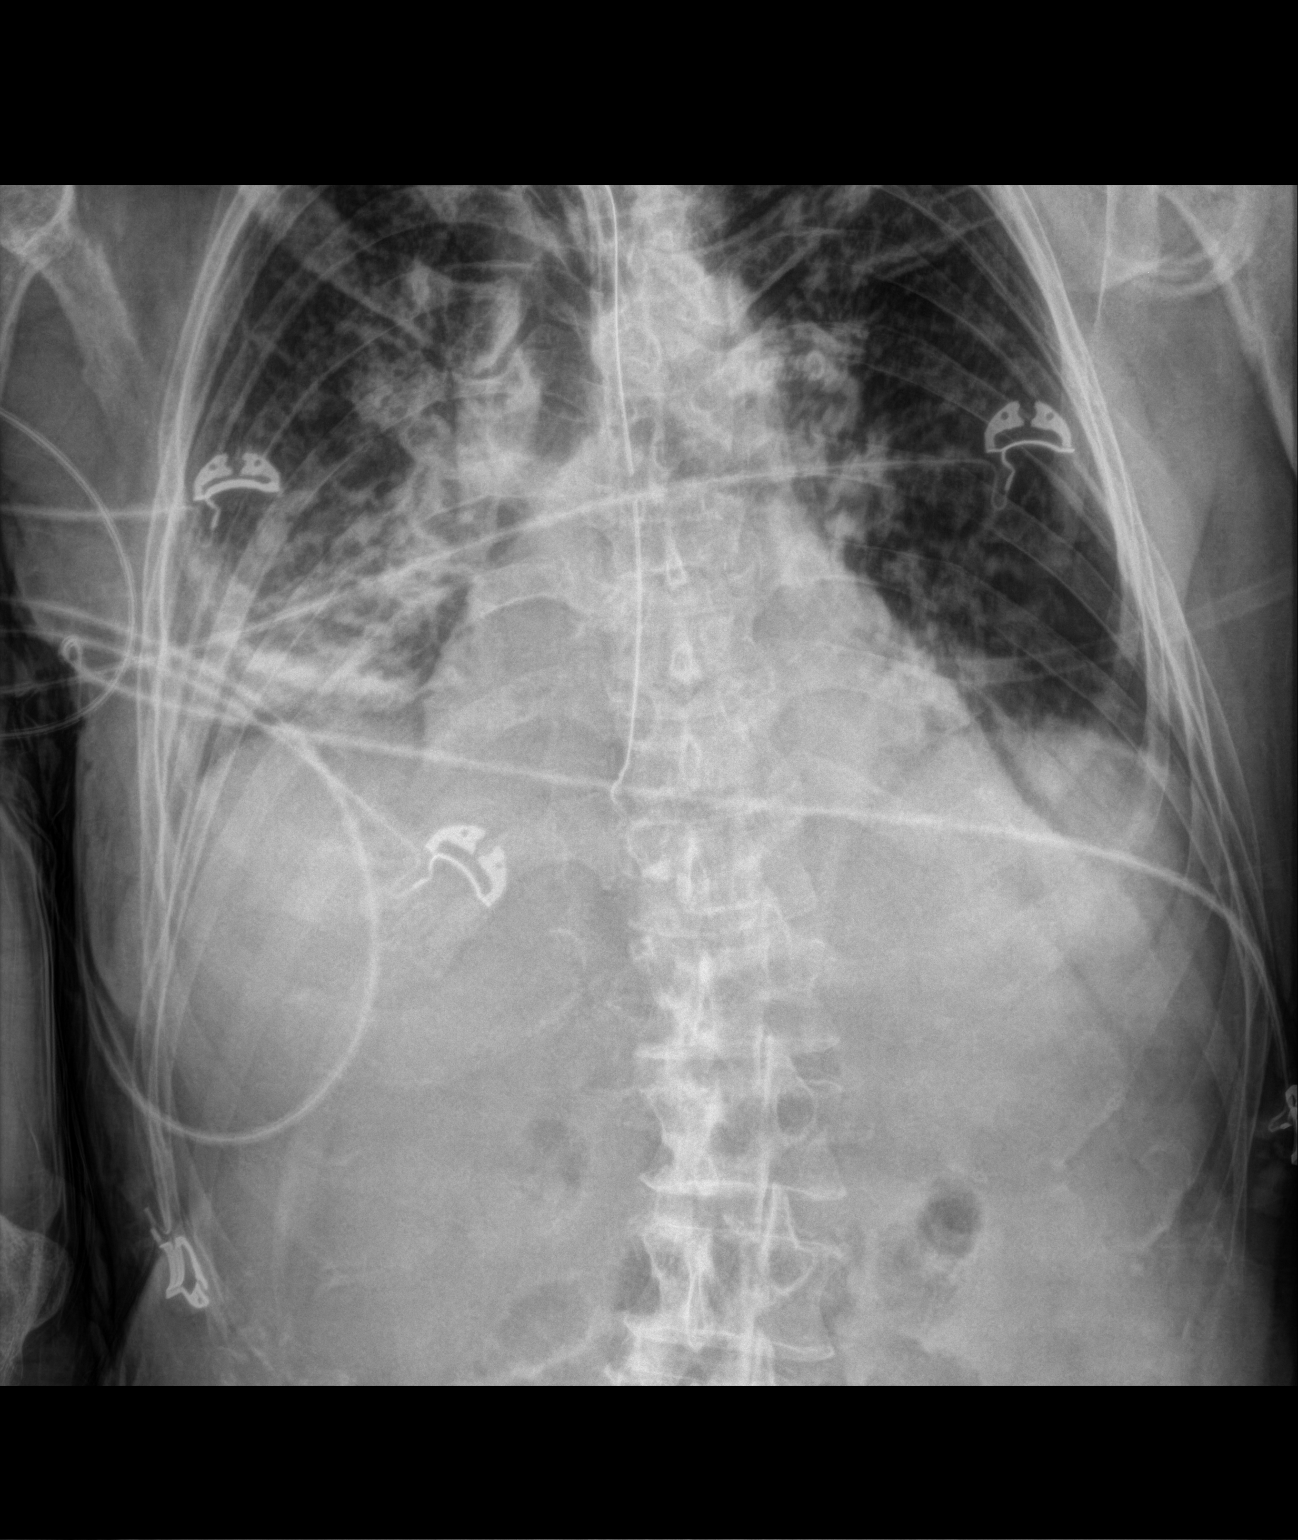

[1 of 1 positions shown; findings below may reference images not displayed]

FINDINGS: Enteric catheter has been retracted and side hole now is at the
level of the mid thorax, and distal tip at the expected location of
distal esophagus. Airspace consolidation is seen within the right
lower lobe.

Bowel gas pattern is poorly visualized.
IMPRESSION: Interval retraction of the enteric catheter.

## 2016-11-23 IMAGING — DX DG CHEST 1V PORT
1 series · 1 of 1 positions shown · non-contrast
Comparison: 06/20/2015.

CLINICAL DATA: Respiratory failure.

EXAM:
PORTABLE CHEST - 1 VIEW

[chest ap]
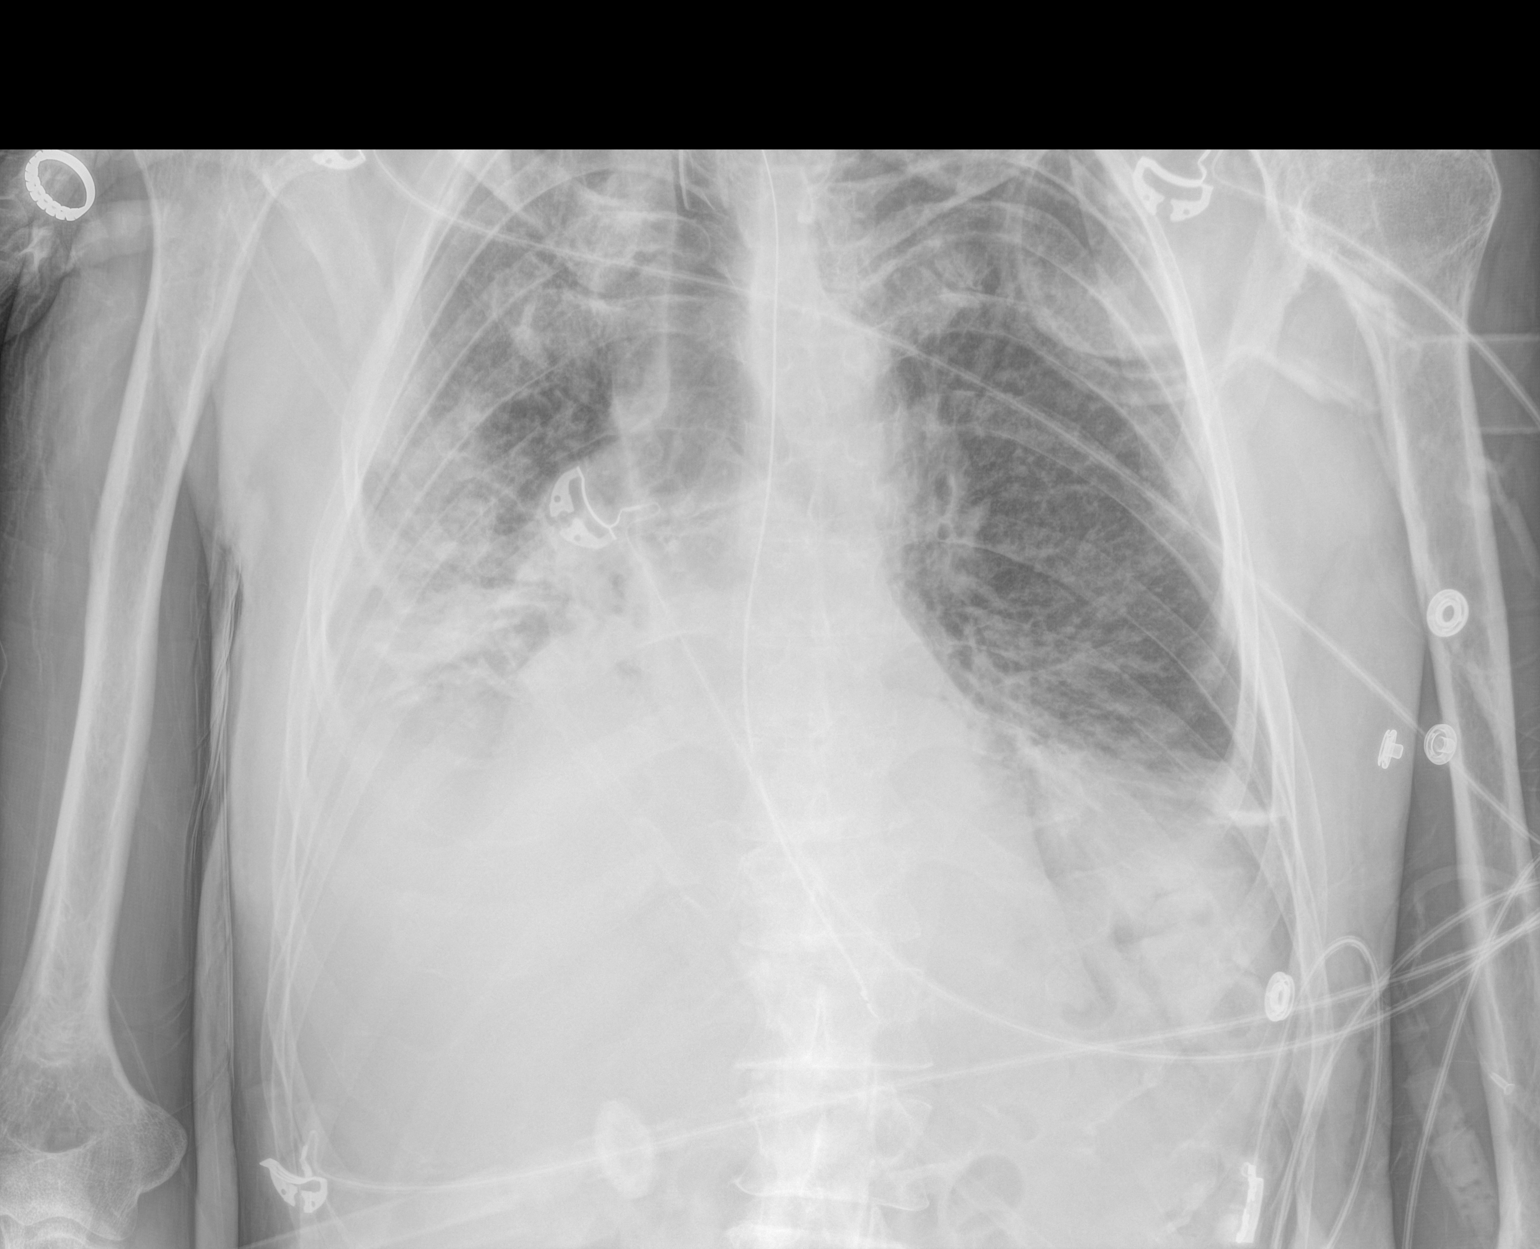

[1 of 1 positions shown; findings below may reference images not displayed]

FINDINGS: Endotracheal tube in stable position. NG tube tip in the stomach. NG
tube side hole is at the gastroesophageal junction. Slight
advancement of NG tube should be considered. Mediastinum and hilar
structures are normal. Persistent but improving right mid and right
lower lobe infiltrate and right pleural effusion. Persistent right
base atelectasis. Persistent left lower lobe mild atelectasis and/or
infiltrate. Heart size stable. No pulmonary venous congestion. No
pneumothorax.
IMPRESSION: 1. Endotracheal tube in good anatomic position . NG tube tip in the
stomach. NG tube side hole at the gastroesophageal junction. Slight
advanced of the NG tube should be considered .
2. Persistent but improving right mid and right lower lobe
infiltrate and right pleural effusion. Persistent right base
atelectasis.
3. Persistent left lower lobe mild atelectasis and/or infiltrate .

## 2016-11-24 IMAGING — CR DG ABDOMEN 1V
1 series · 1 of 1 positions shown · non-contrast
Comparison: June 20, 2015.

CLINICAL DATA: Encounter for orogastric tube placement.

EXAM:
ABDOMEN - 1 VIEW

[ap (kub)]
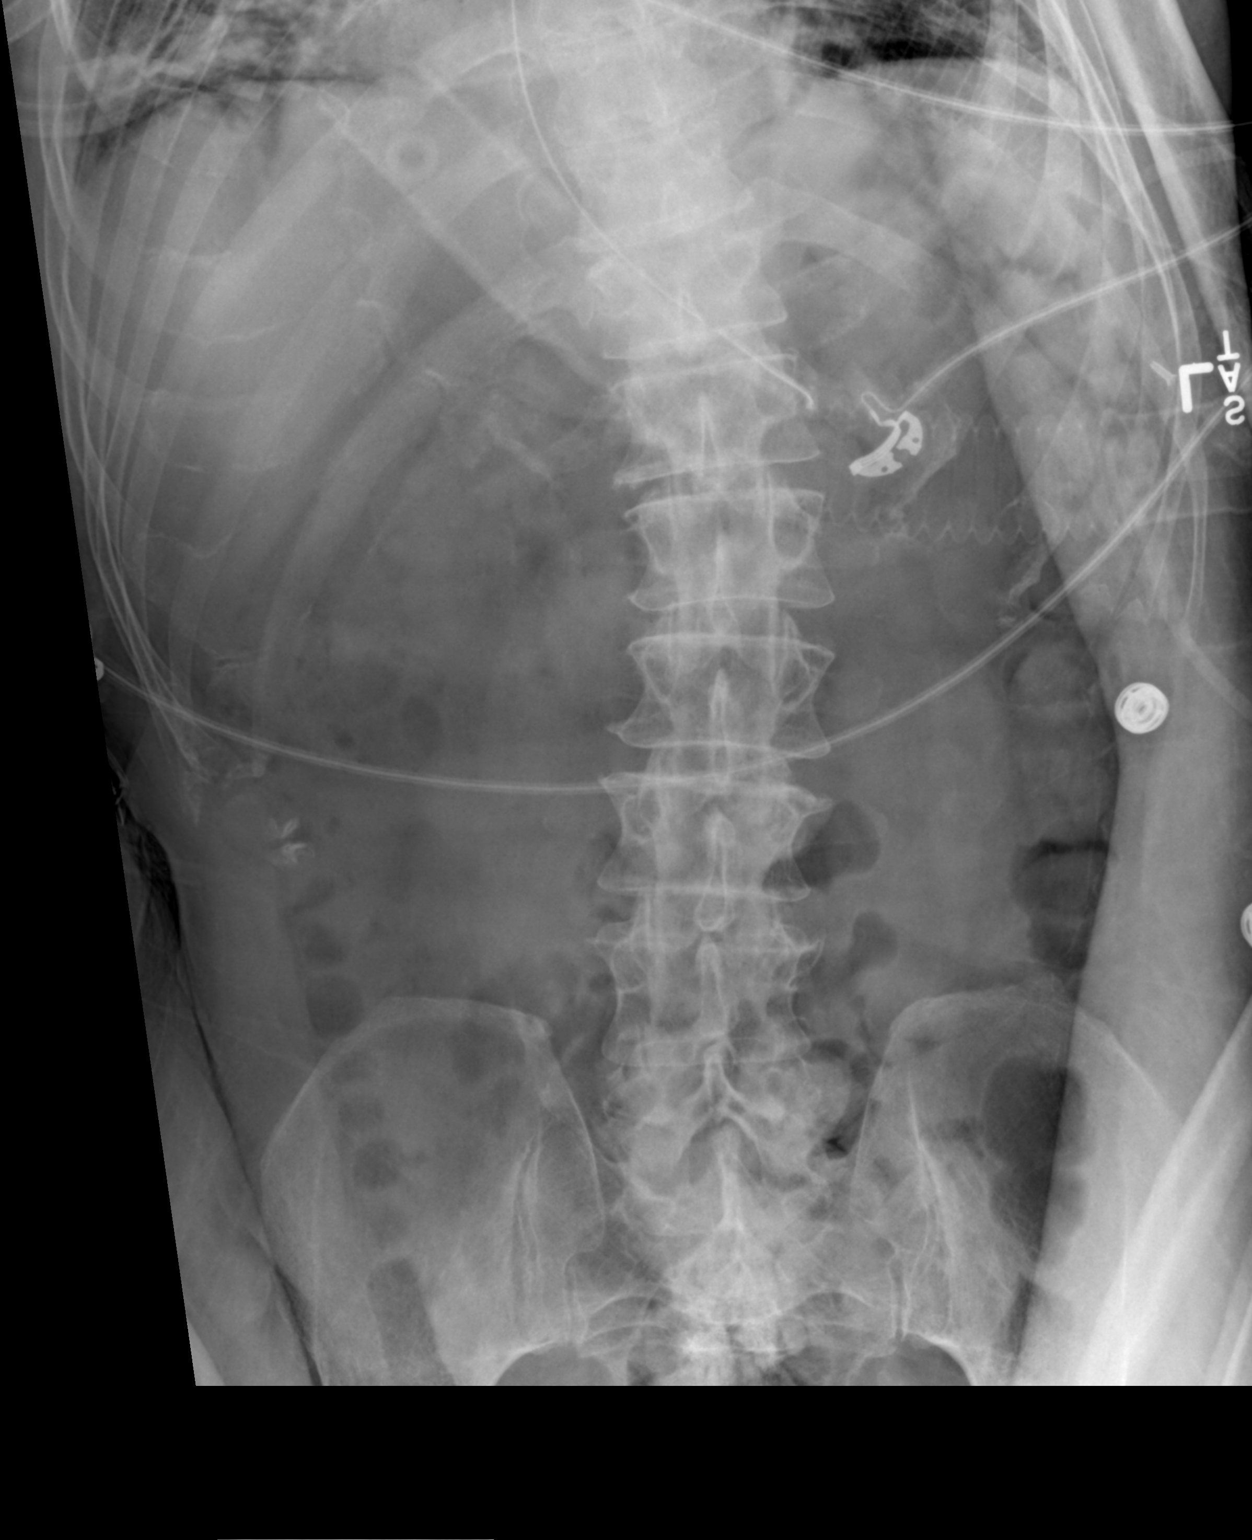

[1 of 1 positions shown; findings below may reference images not displayed]

FINDINGS: The bowel gas pattern is normal. Orogastric tube tip is seen in the
expected position of the proximal stomach. No radio-opaque calculi
or other significant radiographic abnormality are seen.
IMPRESSION: Orogastric tube tip is seen in expected position of the proximal
stomach. No evidence of bowel obstruction or ileus.
